# Patient Record
Sex: Male | Born: 1935 | Race: Black or African American | Hispanic: No | Smoking: Never smoker
Health system: Southern US, Community
[De-identification: ages and names within clinical notes are randomized; demographics above are authoritative.]

## PROBLEM LIST (undated history)

## (undated) DIAGNOSIS — M5136 Other intervertebral disc degeneration, lumbar region: Secondary | ICD-10-CM

## (undated) DIAGNOSIS — N400 Enlarged prostate without lower urinary tract symptoms: Secondary | ICD-10-CM

## (undated) DIAGNOSIS — M25579 Pain in unspecified ankle and joints of unspecified foot: Secondary | ICD-10-CM

## (undated) DIAGNOSIS — Z8701 Personal history of pneumonia (recurrent): Secondary | ICD-10-CM

## (undated) DIAGNOSIS — C679 Malignant neoplasm of bladder, unspecified: Secondary | ICD-10-CM

## (undated) DIAGNOSIS — F32A Depression, unspecified: Secondary | ICD-10-CM

## (undated) DIAGNOSIS — I2510 Atherosclerotic heart disease of native coronary artery without angina pectoris: Secondary | ICD-10-CM

## (undated) DIAGNOSIS — E785 Hyperlipidemia, unspecified: Secondary | ICD-10-CM

## (undated) DIAGNOSIS — J019 Acute sinusitis, unspecified: Secondary | ICD-10-CM

## (undated) DIAGNOSIS — R21 Rash and other nonspecific skin eruption: Secondary | ICD-10-CM

## (undated) DIAGNOSIS — J45909 Unspecified asthma, uncomplicated: Secondary | ICD-10-CM

## (undated) DIAGNOSIS — J309 Allergic rhinitis, unspecified: Secondary | ICD-10-CM

## (undated) DIAGNOSIS — R5381 Other malaise: Secondary | ICD-10-CM

## (undated) DIAGNOSIS — R053 Chronic cough: Secondary | ICD-10-CM

## (undated) DIAGNOSIS — R5383 Other fatigue: Secondary | ICD-10-CM

## (undated) DIAGNOSIS — Z86010 Personal history of colonic polyps: Secondary | ICD-10-CM

## (undated) DIAGNOSIS — G629 Polyneuropathy, unspecified: Secondary | ICD-10-CM

## (undated) DIAGNOSIS — J449 Chronic obstructive pulmonary disease, unspecified: Secondary | ICD-10-CM

## (undated) DIAGNOSIS — I10 Essential (primary) hypertension: Secondary | ICD-10-CM

## (undated) DIAGNOSIS — M7989 Other specified soft tissue disorders: Secondary | ICD-10-CM

## (undated) DIAGNOSIS — N3946 Mixed incontinence: Secondary | ICD-10-CM

## (undated) DIAGNOSIS — M79609 Pain in unspecified limb: Secondary | ICD-10-CM

## (undated) DIAGNOSIS — I739 Peripheral vascular disease, unspecified: Secondary | ICD-10-CM

## (undated) DIAGNOSIS — R319 Hematuria, unspecified: Secondary | ICD-10-CM

## (undated) DIAGNOSIS — Z8781 Personal history of (healed) traumatic fracture: Secondary | ICD-10-CM

## (undated) DIAGNOSIS — J4489 Other specified chronic obstructive pulmonary disease: Secondary | ICD-10-CM

## (undated) DIAGNOSIS — N183 Chronic kidney disease, stage 3 unspecified: Secondary | ICD-10-CM

## (undated) DIAGNOSIS — M51369 Other intervertebral disc degeneration, lumbar region without mention of lumbar back pain or lower extremity pain: Secondary | ICD-10-CM

## (undated) DIAGNOSIS — F329 Major depressive disorder, single episode, unspecified: Secondary | ICD-10-CM

## (undated) DIAGNOSIS — J441 Chronic obstructive pulmonary disease with (acute) exacerbation: Secondary | ICD-10-CM

## (undated) DIAGNOSIS — IMO0001 Reserved for inherently not codable concepts without codable children: Secondary | ICD-10-CM

## (undated) DIAGNOSIS — M5430 Sciatica, unspecified side: Secondary | ICD-10-CM

## (undated) DIAGNOSIS — M25519 Pain in unspecified shoulder: Secondary | ICD-10-CM

## (undated) DIAGNOSIS — M549 Dorsalgia, unspecified: Secondary | ICD-10-CM

## (undated) DIAGNOSIS — Z860101 Personal history of adenomatous and serrated colon polyps: Secondary | ICD-10-CM

## (undated) DIAGNOSIS — Z8616 Personal history of COVID-19: Secondary | ICD-10-CM

## (undated) DIAGNOSIS — R609 Edema, unspecified: Secondary | ICD-10-CM

## (undated) DIAGNOSIS — Z972 Presence of dental prosthetic device (complete) (partial): Secondary | ICD-10-CM

## (undated) DIAGNOSIS — F0390 Unspecified dementia without behavioral disturbance: Secondary | ICD-10-CM

## (undated) DIAGNOSIS — E559 Vitamin D deficiency, unspecified: Secondary | ICD-10-CM

## (undated) DIAGNOSIS — J988 Other specified respiratory disorders: Secondary | ICD-10-CM

## (undated) DIAGNOSIS — R209 Unspecified disturbances of skin sensation: Secondary | ICD-10-CM

## (undated) DIAGNOSIS — B0223 Postherpetic polyneuropathy: Secondary | ICD-10-CM

## (undated) HISTORY — DX: Allergic rhinitis, unspecified: J30.9

## (undated) HISTORY — DX: Major depressive disorder, single episode, unspecified: F32.9

## (undated) HISTORY — DX: Pain in unspecified ankle and joints of unspecified foot: M25.579

## (undated) HISTORY — DX: Chronic obstructive pulmonary disease with (acute) exacerbation: J44.1

## (undated) HISTORY — DX: Reserved for inherently not codable concepts without codable children: IMO0001

## (undated) HISTORY — DX: Unspecified asthma, uncomplicated: J45.909

## (undated) HISTORY — DX: Rash and other nonspecific skin eruption: R21

## (undated) HISTORY — DX: Vitamin D deficiency, unspecified: E55.9

## (undated) HISTORY — DX: Sciatica, unspecified side: M54.30

## (undated) HISTORY — DX: Chronic obstructive pulmonary disease, unspecified: J44.9

## (undated) HISTORY — DX: Peripheral vascular disease, unspecified: I73.9

## (undated) HISTORY — DX: Other fatigue: R53.83

## (undated) HISTORY — DX: Polyneuropathy, unspecified: G62.9

## (undated) HISTORY — DX: Unspecified dementia without behavioral disturbance: F03.90

## (undated) HISTORY — DX: Acute sinusitis, unspecified: J01.90

## (undated) HISTORY — DX: Unspecified disturbances of skin sensation: R20.9

## (undated) HISTORY — DX: Other malaise: R53.81

## (undated) HISTORY — DX: Edema, unspecified: R60.9

## (undated) HISTORY — DX: Hyperlipidemia, unspecified: E78.5

## (undated) HISTORY — DX: Essential (primary) hypertension: I10

## (undated) HISTORY — DX: Personal history of colonic polyps: Z86.010

## (undated) HISTORY — DX: Pain in unspecified shoulder: M25.519

## (undated) HISTORY — DX: Atherosclerotic heart disease of native coronary artery without angina pectoris: I25.10

## (undated) HISTORY — DX: Pain in unspecified limb: M79.609

## (undated) HISTORY — DX: Postherpetic polyneuropathy: B02.23

## (undated) HISTORY — DX: Dorsalgia, unspecified: M54.9

## (undated) HISTORY — DX: Benign prostatic hyperplasia without lower urinary tract symptoms: N40.0

## (undated) HISTORY — PX: OTHER SURGICAL HISTORY: SHX169

---

## 1998-08-28 ENCOUNTER — Encounter: Payer: Self-pay | Admitting: Internal Medicine

## 1998-08-28 ENCOUNTER — Ambulatory Visit: Admission: RE | Admit: 1998-08-28 | Discharge: 1998-08-28 | Payer: Self-pay | Admitting: Internal Medicine

## 1998-09-12 ENCOUNTER — Encounter: Payer: Self-pay | Admitting: Internal Medicine

## 1998-09-12 ENCOUNTER — Ambulatory Visit (HOSPITAL_COMMUNITY): Admission: RE | Admit: 1998-09-12 | Discharge: 1998-09-12 | Payer: Self-pay | Admitting: Internal Medicine

## 2001-08-11 ENCOUNTER — Ambulatory Visit (HOSPITAL_COMMUNITY): Admission: RE | Admit: 2001-08-11 | Discharge: 2001-08-11 | Payer: Self-pay | Admitting: *Deleted

## 2001-08-11 ENCOUNTER — Encounter: Payer: Self-pay | Admitting: *Deleted

## 2004-11-25 ENCOUNTER — Ambulatory Visit: Payer: Self-pay | Admitting: Internal Medicine

## 2004-12-11 ENCOUNTER — Ambulatory Visit: Payer: Self-pay | Admitting: Internal Medicine

## 2005-01-09 ENCOUNTER — Ambulatory Visit: Payer: Self-pay | Admitting: Internal Medicine

## 2005-03-20 ENCOUNTER — Ambulatory Visit: Payer: Self-pay | Admitting: Internal Medicine

## 2005-06-25 ENCOUNTER — Ambulatory Visit: Payer: Self-pay | Admitting: Internal Medicine

## 2005-07-08 ENCOUNTER — Ambulatory Visit: Payer: Self-pay | Admitting: Internal Medicine

## 2005-07-25 ENCOUNTER — Ambulatory Visit: Payer: Self-pay | Admitting: Internal Medicine

## 2005-09-03 ENCOUNTER — Ambulatory Visit: Payer: Self-pay | Admitting: Internal Medicine

## 2005-09-10 ENCOUNTER — Ambulatory Visit: Payer: Self-pay | Admitting: Internal Medicine

## 2005-09-12 ENCOUNTER — Ambulatory Visit: Payer: Self-pay

## 2005-09-26 ENCOUNTER — Ambulatory Visit: Payer: Self-pay | Admitting: Internal Medicine

## 2005-10-29 ENCOUNTER — Ambulatory Visit: Payer: Self-pay | Admitting: Internal Medicine

## 2005-12-19 ENCOUNTER — Ambulatory Visit: Payer: Self-pay | Admitting: Internal Medicine

## 2006-01-06 ENCOUNTER — Ambulatory Visit: Payer: Self-pay | Admitting: Internal Medicine

## 2006-06-15 ENCOUNTER — Ambulatory Visit: Payer: Self-pay | Admitting: Internal Medicine

## 2006-06-24 ENCOUNTER — Ambulatory Visit: Payer: Self-pay | Admitting: Internal Medicine

## 2006-06-24 LAB — CONVERTED CEMR LAB
ALT: 19 units/L (ref 0–40)
AST: 22 units/L (ref 0–37)
Albumin: 3.7 g/dL (ref 3.5–5.2)
Alkaline Phosphatase: 55 units/L (ref 39–117)
BUN: 11 mg/dL (ref 6–23)
Basophils Absolute: 0 10*3/uL (ref 0.0–0.1)
Basophils Relative: 0.2 % (ref 0.0–1.0)
Bilirubin Urine: NEGATIVE
CO2: 35 meq/L — ABNORMAL HIGH (ref 19–32)
Calcium: 9.6 mg/dL (ref 8.4–10.5)
Chloride: 98 meq/L (ref 96–112)
Chol/HDL Ratio, serum: 4.3
Cholesterol: 206 mg/dL (ref 0–200)
Creatinine, Ser: 1 mg/dL (ref 0.4–1.5)
Eosinophil percent: 1.8 % (ref 0.0–5.0)
GFR calc non Af Amer: 79 mL/min
Glomerular Filtration Rate, Af Am: 95 mL/min/{1.73_m2}
Glucose, Bld: 85 mg/dL (ref 70–99)
HCT: 45.9 % (ref 39.0–52.0)
HDL: 48 mg/dL (ref >39.0)
Hemoglobin, Urine: NEGATIVE
Hemoglobin: 14.9 g/dL (ref 13.0–17.0)
Ketones, ur: NEGATIVE mg/dL
LDL DIRECT: 131.5 mg/dL
Leukocytes, UA: NEGATIVE
Lymphocytes Relative: 12.2 % (ref 12.0–46.0)
MCHC: 32.5 g/dL (ref 30.0–36.0)
MCV: 82.6 fL (ref 78.0–100.0)
Monocytes Absolute: 0.6 10*3/uL (ref 0.2–0.7)
Monocytes Relative: 7.4 % (ref 3.0–11.0)
Neutro Abs: 6.4 10*3/uL (ref 1.4–7.7)
Neutrophils Relative %: 78.4 % — ABNORMAL HIGH (ref 43.0–77.0)
Nitrite: NEGATIVE
PSA: 1.2 ng/mL (ref 0.10–4.00)
Platelets: 219 10*3/uL (ref 150–400)
Potassium: 3.9 meq/L (ref 3.5–5.1)
RBC: 5.55 M/uL (ref 4.22–5.81)
RDW: 13.9 % (ref 11.5–14.6)
Sodium: 139 meq/L (ref 135–145)
Specific Gravity, Urine: 1.02 (ref 1.000–1.03)
TSH: 0.49 microintl units/mL (ref 0.35–5.50)
Total Bilirubin: 0.7 mg/dL (ref 0.3–1.2)
Total Protein, Urine: NEGATIVE mg/dL
Total Protein: 7.2 g/dL (ref 6.0–8.3)
Triglyceride fasting, serum: 79 mg/dL (ref 0–149)
Urine Glucose: NEGATIVE mg/dL
Urobilinogen, UA: 0.2 (ref 0.0–1.0)
VLDL: 16 mg/dL (ref 0–40)
WBC: 8.1 10*3/uL (ref 4.5–10.5)
pH: 6.5 (ref 5.0–8.0)

## 2006-06-26 ENCOUNTER — Ambulatory Visit: Payer: Self-pay | Admitting: Internal Medicine

## 2006-07-14 ENCOUNTER — Ambulatory Visit: Payer: Self-pay | Admitting: Internal Medicine

## 2006-07-29 ENCOUNTER — Encounter (INDEPENDENT_AMBULATORY_CARE_PROVIDER_SITE_OTHER): Payer: Self-pay | Admitting: *Deleted

## 2006-07-29 ENCOUNTER — Ambulatory Visit: Payer: Self-pay | Admitting: Internal Medicine

## 2007-02-11 ENCOUNTER — Ambulatory Visit: Payer: Self-pay | Admitting: Internal Medicine

## 2007-04-03 ENCOUNTER — Encounter: Payer: Self-pay | Admitting: Internal Medicine

## 2007-04-03 DIAGNOSIS — I10 Essential (primary) hypertension: Secondary | ICD-10-CM

## 2007-04-03 DIAGNOSIS — E785 Hyperlipidemia, unspecified: Secondary | ICD-10-CM

## 2007-04-23 ENCOUNTER — Ambulatory Visit: Payer: Self-pay | Admitting: Internal Medicine

## 2007-04-23 ENCOUNTER — Inpatient Hospital Stay (HOSPITAL_COMMUNITY): Admission: AD | Admit: 2007-04-23 | Discharge: 2007-04-27 | Payer: Self-pay | Admitting: Internal Medicine

## 2007-04-23 LAB — CONVERTED CEMR LAB
Basophils Absolute: 0 10*3/uL (ref 0.0–0.1)
Basophils Relative: 0 % (ref 0.0–1.0)
Eosinophils Absolute: 0.1 10*3/uL (ref 0.0–0.6)
Eosinophils Relative: 0.5 % (ref 0.0–5.0)
HCT: 48.5 % (ref 39.0–52.0)
Hemoglobin: 15.8 g/dL (ref 13.0–17.0)
Lymphocytes Relative: 6.7 % — ABNORMAL LOW (ref 12.0–46.0)
MCHC: 32.5 g/dL (ref 30.0–36.0)
MCV: 81.7 fL (ref 78.0–100.0)
Monocytes Absolute: 0.6 10*3/uL (ref 0.2–0.7)
Monocytes Relative: 4.7 % (ref 3.0–11.0)
Neutro Abs: 12.2 10*3/uL — ABNORMAL HIGH (ref 1.4–7.7)
Neutrophils Relative %: 88.1 % — ABNORMAL HIGH (ref 43.0–77.0)
Platelets: 182 10*3/uL (ref 150–400)
RBC: 5.93 M/uL — ABNORMAL HIGH (ref 4.22–5.81)
RDW: 15.1 % — ABNORMAL HIGH (ref 11.5–14.6)
WBC: 13.8 10*3/uL — ABNORMAL HIGH (ref 4.5–10.5)

## 2007-05-18 ENCOUNTER — Ambulatory Visit: Payer: Self-pay | Admitting: Internal Medicine

## 2007-10-27 ENCOUNTER — Ambulatory Visit: Payer: Self-pay | Admitting: Internal Medicine

## 2007-10-27 DIAGNOSIS — Z860100 Personal history of colon polyps, unspecified: Secondary | ICD-10-CM | POA: Insufficient documentation

## 2007-10-27 DIAGNOSIS — I2510 Atherosclerotic heart disease of native coronary artery without angina pectoris: Secondary | ICD-10-CM

## 2007-10-27 DIAGNOSIS — R21 Rash and other nonspecific skin eruption: Secondary | ICD-10-CM | POA: Insufficient documentation

## 2007-10-27 DIAGNOSIS — J309 Allergic rhinitis, unspecified: Secondary | ICD-10-CM

## 2007-10-27 DIAGNOSIS — N400 Enlarged prostate without lower urinary tract symptoms: Secondary | ICD-10-CM | POA: Insufficient documentation

## 2007-10-27 DIAGNOSIS — J45909 Unspecified asthma, uncomplicated: Secondary | ICD-10-CM | POA: Insufficient documentation

## 2007-10-27 DIAGNOSIS — J449 Chronic obstructive pulmonary disease, unspecified: Secondary | ICD-10-CM | POA: Insufficient documentation

## 2007-10-27 DIAGNOSIS — I739 Peripheral vascular disease, unspecified: Secondary | ICD-10-CM | POA: Insufficient documentation

## 2007-11-02 ENCOUNTER — Encounter (INDEPENDENT_AMBULATORY_CARE_PROVIDER_SITE_OTHER): Payer: Self-pay | Admitting: *Deleted

## 2007-11-15 ENCOUNTER — Telehealth (INDEPENDENT_AMBULATORY_CARE_PROVIDER_SITE_OTHER): Payer: Self-pay | Admitting: *Deleted

## 2007-11-22 ENCOUNTER — Ambulatory Visit: Payer: Self-pay | Admitting: Internal Medicine

## 2007-11-22 DIAGNOSIS — J441 Chronic obstructive pulmonary disease with (acute) exacerbation: Secondary | ICD-10-CM

## 2008-02-04 ENCOUNTER — Ambulatory Visit: Payer: Self-pay | Admitting: Internal Medicine

## 2008-02-04 DIAGNOSIS — M79609 Pain in unspecified limb: Secondary | ICD-10-CM | POA: Insufficient documentation

## 2008-02-04 DIAGNOSIS — IMO0001 Reserved for inherently not codable concepts without codable children: Secondary | ICD-10-CM

## 2008-02-07 ENCOUNTER — Encounter (INDEPENDENT_AMBULATORY_CARE_PROVIDER_SITE_OTHER): Payer: Self-pay | Admitting: *Deleted

## 2008-02-21 ENCOUNTER — Telehealth (INDEPENDENT_AMBULATORY_CARE_PROVIDER_SITE_OTHER): Payer: Self-pay | Admitting: *Deleted

## 2008-05-30 ENCOUNTER — Telehealth: Payer: Self-pay | Admitting: Internal Medicine

## 2008-06-22 ENCOUNTER — Ambulatory Visit: Payer: Self-pay | Admitting: Internal Medicine

## 2008-06-22 LAB — CONVERTED CEMR LAB
ALT: 20 units/L (ref 0–53)
AST: 20 units/L (ref 0–37)
Albumin: 3.7 g/dL (ref 3.5–5.2)
Alkaline Phosphatase: 50 units/L (ref 39–117)
BUN: 20 mg/dL (ref 6–23)
Basophils Absolute: 0 10*3/uL (ref 0.0–0.1)
Basophils Relative: 0.1 % (ref 0.0–3.0)
Bilirubin Urine: NEGATIVE
Bilirubin, Direct: 0.3 mg/dL (ref 0.0–0.3)
CO2: 33 meq/L — ABNORMAL HIGH (ref 19–32)
Calcium: 9.3 mg/dL (ref 8.4–10.5)
Chloride: 99 meq/L (ref 96–112)
Cholesterol: 180 mg/dL (ref 0–200)
Creatinine, Ser: 1.1 mg/dL (ref 0.4–1.5)
Eosinophils Absolute: 0.1 10*3/uL (ref 0.0–0.7)
Eosinophils Relative: 1.3 % (ref 0.0–5.0)
GFR calc Af Amer: 85 mL/min
GFR calc non Af Amer: 70 mL/min
Glucose, Bld: 84 mg/dL (ref 70–99)
HCT: 45 % (ref 39.0–52.0)
HDL: 67.6 mg/dL (ref >39.0)
Hemoglobin, Urine: NEGATIVE
Hemoglobin: 15.1 g/dL (ref 13.0–17.0)
Ketones, ur: NEGATIVE mg/dL
LDL Cholesterol: 102 mg/dL — ABNORMAL HIGH (ref 0–99)
Leukocytes, UA: NEGATIVE
Lymphocytes Relative: 13.9 % (ref 12.0–46.0)
MCHC: 33.6 g/dL (ref 30.0–36.0)
MCV: 82.1 fL (ref 78.0–100.0)
Monocytes Absolute: 0.6 10*3/uL (ref 0.1–1.0)
Monocytes Relative: 8.4 % (ref 3.0–12.0)
Neutro Abs: 5 10*3/uL (ref 1.4–7.7)
Neutrophils Relative %: 76.3 % (ref 43.0–77.0)
Nitrite: NEGATIVE
PSA: 0.91 ng/mL (ref 0.10–4.00)
Platelets: 177 10*3/uL (ref 150–400)
Potassium: 3.4 meq/L — ABNORMAL LOW (ref 3.5–5.1)
RBC: 5.48 M/uL (ref 4.22–5.81)
RDW: 14.7 % — ABNORMAL HIGH (ref 11.5–14.6)
Sodium: 140 meq/L (ref 135–145)
Specific Gravity, Urine: 1.025 (ref 1.000–1.03)
TSH: 1.09 microintl units/mL (ref 0.35–5.50)
Total Bilirubin: 1.1 mg/dL (ref 0.3–1.2)
Total CHOL/HDL Ratio: 2.7
Total Protein, Urine: NEGATIVE mg/dL
Total Protein: 7.3 g/dL (ref 6.0–8.3)
Triglycerides: 50 mg/dL (ref 0–149)
Urine Glucose: NEGATIVE mg/dL
Urobilinogen, UA: 0.2 (ref 0.0–1.0)
VLDL: 10 mg/dL (ref 0–40)
WBC: 6.6 10*3/uL (ref 4.5–10.5)
pH: 5.5 (ref 5.0–8.0)

## 2008-07-05 ENCOUNTER — Telehealth: Payer: Self-pay | Admitting: Internal Medicine

## 2008-07-13 ENCOUNTER — Encounter: Payer: Self-pay | Admitting: Internal Medicine

## 2008-08-15 ENCOUNTER — Ambulatory Visit: Payer: Self-pay | Admitting: Internal Medicine

## 2008-08-15 DIAGNOSIS — B0223 Postherpetic polyneuropathy: Secondary | ICD-10-CM

## 2008-12-15 ENCOUNTER — Ambulatory Visit: Payer: Self-pay | Admitting: Internal Medicine

## 2008-12-16 LAB — CONVERTED CEMR LAB
AST: 21 units/L (ref 0–37)
Bilirubin, Direct: 0.2 mg/dL (ref 0.0–0.3)
HDL: 57.9 mg/dL (ref >39.00)
Total Bilirubin: 1.1 mg/dL (ref 0.3–1.2)
Total CHOL/HDL Ratio: 3

## 2009-01-23 ENCOUNTER — Telehealth (INDEPENDENT_AMBULATORY_CARE_PROVIDER_SITE_OTHER): Payer: Self-pay | Admitting: *Deleted

## 2009-01-23 ENCOUNTER — Encounter: Payer: Self-pay | Admitting: Internal Medicine

## 2009-01-24 ENCOUNTER — Ambulatory Visit: Payer: Self-pay | Admitting: Internal Medicine

## 2009-02-23 ENCOUNTER — Ambulatory Visit: Payer: Self-pay | Admitting: Internal Medicine

## 2009-04-24 ENCOUNTER — Ambulatory Visit: Payer: Self-pay | Admitting: Internal Medicine

## 2009-04-24 DIAGNOSIS — R042 Hemoptysis: Secondary | ICD-10-CM | POA: Insufficient documentation

## 2009-06-05 ENCOUNTER — Ambulatory Visit: Payer: Self-pay | Admitting: Internal Medicine

## 2009-06-15 LAB — CONVERTED CEMR LAB
Albumin: 3.7 g/dL (ref 3.5–5.2)
BUN: 13 mg/dL (ref 6–23)
Basophils Absolute: 0.1 10*3/uL (ref 0.0–0.1)
CO2: 32 meq/L (ref 19–32)
Calcium: 9.3 mg/dL (ref 8.4–10.5)
Eosinophils Absolute: 0.1 10*3/uL (ref 0.0–0.7)
Glucose, Bld: 77 mg/dL (ref 70–99)
HCT: 44.2 % (ref 39.0–52.0)
Hemoglobin: 14.7 g/dL (ref 13.0–17.0)
Lymphs Abs: 0.8 10*3/uL (ref 0.7–4.0)
MCHC: 33.3 g/dL (ref 30.0–36.0)
Neutro Abs: 7.4 10*3/uL (ref 1.4–7.7)
Platelets: 178 10*3/uL (ref 150.0–400.0)
Potassium: 4 meq/L (ref 3.5–5.1)
RDW: 13.7 % (ref 11.5–14.6)
Sodium: 144 meq/L (ref 135–145)
Total Bilirubin: 0.8 mg/dL (ref 0.3–1.2)

## 2009-06-21 ENCOUNTER — Ambulatory Visit: Payer: Self-pay | Admitting: Internal Medicine

## 2009-06-21 DIAGNOSIS — M25519 Pain in unspecified shoulder: Secondary | ICD-10-CM

## 2009-07-20 ENCOUNTER — Telehealth: Payer: Self-pay | Admitting: Family Medicine

## 2009-10-08 ENCOUNTER — Ambulatory Visit: Payer: Self-pay | Admitting: Internal Medicine

## 2009-10-08 DIAGNOSIS — M549 Dorsalgia, unspecified: Secondary | ICD-10-CM | POA: Insufficient documentation

## 2009-10-08 DIAGNOSIS — J019 Acute sinusitis, unspecified: Secondary | ICD-10-CM

## 2009-10-10 ENCOUNTER — Ambulatory Visit: Payer: Self-pay | Admitting: Internal Medicine

## 2009-10-11 ENCOUNTER — Telehealth: Payer: Self-pay | Admitting: Internal Medicine

## 2009-11-08 ENCOUNTER — Ambulatory Visit: Payer: Self-pay | Admitting: Internal Medicine

## 2009-11-08 DIAGNOSIS — M5430 Sciatica, unspecified side: Secondary | ICD-10-CM

## 2009-11-16 ENCOUNTER — Ambulatory Visit: Payer: Self-pay | Admitting: Internal Medicine

## 2009-11-16 DIAGNOSIS — R609 Edema, unspecified: Secondary | ICD-10-CM | POA: Insufficient documentation

## 2009-11-16 LAB — CONVERTED CEMR LAB
CO2: 33 meq/L — ABNORMAL HIGH (ref 19–32)
Chloride: 105 meq/L (ref 96–112)
Potassium: 4 meq/L (ref 3.5–5.1)
Sodium: 144 meq/L (ref 135–145)

## 2009-11-30 ENCOUNTER — Ambulatory Visit: Payer: Self-pay | Admitting: Internal Medicine

## 2009-11-30 DIAGNOSIS — R5383 Other fatigue: Secondary | ICD-10-CM

## 2009-11-30 DIAGNOSIS — R5381 Other malaise: Secondary | ICD-10-CM | POA: Insufficient documentation

## 2009-11-30 DIAGNOSIS — M25579 Pain in unspecified ankle and joints of unspecified foot: Secondary | ICD-10-CM

## 2009-11-30 DIAGNOSIS — F329 Major depressive disorder, single episode, unspecified: Secondary | ICD-10-CM

## 2009-11-30 LAB — CONVERTED CEMR LAB
ALT: 12 units/L (ref 0–53)
BUN: 12 mg/dL (ref 6–23)
Bilirubin Urine: NEGATIVE
CO2: 35 meq/L — ABNORMAL HIGH (ref 19–32)
Chloride: 102 meq/L (ref 96–112)
Cholesterol: 186 mg/dL (ref 0–200)
Eosinophils Relative: 2 % (ref 0.0–5.0)
Glucose, Bld: 80 mg/dL (ref 70–99)
HCT: 40 % (ref 39.0–52.0)
Ketones, ur: NEGATIVE mg/dL
LDL Cholesterol: 102 mg/dL — ABNORMAL HIGH (ref 0–99)
Lymphs Abs: 0.7 10*3/uL (ref 0.7–4.0)
MCHC: 33.5 g/dL (ref 30.0–36.0)
MCV: 81.3 fL (ref 78.0–100.0)
Monocytes Absolute: 0.6 10*3/uL (ref 0.1–1.0)
Platelets: 168 10*3/uL (ref 150.0–400.0)
Potassium: 4.1 meq/L (ref 3.5–5.1)
Saturation Ratios: 28.7 % (ref 20.0–50.0)
Sed Rate: 24 mm/hr — ABNORMAL HIGH (ref 0–22)
TSH: 0.59 microintl units/mL (ref 0.35–5.50)
Total Bilirubin: 0.6 mg/dL (ref 0.3–1.2)
Total Protein: 6.3 g/dL (ref 6.0–8.3)
Triglycerides: 41 mg/dL (ref 0.0–149.0)
Urine Glucose: NEGATIVE mg/dL
Urobilinogen, UA: 2 (ref 0.0–1.0)
VLDL: 8.2 mg/dL (ref 0.0–40.0)
WBC: 6.7 10*3/uL (ref 4.5–10.5)

## 2009-12-03 ENCOUNTER — Ambulatory Visit: Payer: Self-pay | Admitting: Internal Medicine

## 2010-06-04 ENCOUNTER — Ambulatory Visit: Payer: Self-pay | Admitting: Internal Medicine

## 2010-06-04 DIAGNOSIS — E559 Vitamin D deficiency, unspecified: Secondary | ICD-10-CM

## 2010-06-05 ENCOUNTER — Ambulatory Visit: Payer: Self-pay | Admitting: Internal Medicine

## 2010-09-02 ENCOUNTER — Ambulatory Visit
Admission: RE | Admit: 2010-09-02 | Discharge: 2010-09-02 | Payer: Self-pay | Source: Home / Self Care | Attending: Internal Medicine | Admitting: Internal Medicine

## 2010-09-02 DIAGNOSIS — R209 Unspecified disturbances of skin sensation: Secondary | ICD-10-CM | POA: Insufficient documentation

## 2010-09-24 NOTE — Assessment & Plan Note (Signed)
Summary: 6 months/ mbw   CC:  6 mth rov - SOB comes and goes - Occas prod cough (white) - Denies wheezing - Pt is out of Symbicort and Proair.  History of Present Illness:  04/24/09- Asthma/ COPD, hx Byssinosis, allergic thinitis, rhinitsinusitis Coughed streaks of  blood in mucus again for a few days last week. Self limited. Has been taking aspirin off and on- discussed. Not coughing more than usual. Denies chest pain or fever, glands or other  bleeding. Denies exertional chest pain, palpitation, heartburn nausea, reflux, melena. CXR 01/23/09- questioned pneumonia left lung.  June 05, 2009- Asthma/ COPD, hx Byssinosis, allergic rhinitis, rhiniosinusitis Compalins of lack of energy for several weeks. Bleeding stopped when he quit Anacins. Denies glands, fever, pain, weight loss.  Breathing ok but nose runs. CXR- COPD but no acute change since 2008  December 03, 2009- Asthma/ COPD, hx Byssinosis, allergic rhinitis, rhinosinusitis ........daughter here He has been seeing Dr Jonny Ruiz quite a bit and took an antibiotic for bronchits. He has now retired. He says he is breathing ok today. He wears a mask to mow. Has not coughed blood in a very long time, especially as he has backed off on aspirin. He still uses Symbicort twice daily.  June 05, 2010- Asthma/ COPD, hx Byssinosis, allergic rhinitis, rhinosinusitis  Nurse CC: 6 mth rov - SOB comes and goes - Occas prod cough (white) - Denies wheezing - Pt is out of Symbicort and Proair.  He is retired and not doing much. Treated nasal stuffiness adding fluticasone- discussed. Work exposure was dusty, but away from that he has much less cough and wheeze. He doesn't want flu shot- never had one. Does have daily cough.    Asthma History    Initial Asthma Severity Rating:    Age range: 12+ years    Symptoms: >2 days/week; not daily    Nighttime Awakenings: 0-2/month    Interferes w/ normal activity: no limitations    SABA use (not for EIB): 0-2  days/week    Asthma Severity Assessment: Mild Persistent   Preventive Screening-Counseling & Management  Alcohol-Tobacco     Smoking Status: never  Current Medications (verified): 1)  Lovastatin 40 Mg  Tabs (Lovastatin) .Marland Kitchen.. 1 By Mouth Once Daily 2)  Exforge Hct 5-160-12.5 Mg Tabs (Amlodipine-Valsartan-Hctz) .Marland Kitchen.. 1po Once Daily 3)  Ecotrin Low Strength 81 Mg  Tbec (Aspirin) .Marland Kitchen.. 1 By Mouth Qd 4)  Symbicort 160-4.5 Mcg/act  Aero (Budesonide-Formoterol Fumarate) .... 2 Puffs Bid 5)  Proair Hfa 108 (90 Base) Mcg/act Aers (Albuterol Sulfate) .... 2 Puffs Four Times A Day As Needed Rescue Inhaler 6)  Fluticasone Propionate 50 Mcg/act Susp (Fluticasone Propionate) .... 2 Spray/side Once Daily 7)  Fexofenadine Hcl 180 Mg Tabs (Fexofenadine Hcl) .Marland Kitchen.. 1po Once Daily For Allergies 8)  Vitamin D3 2000 Unit Caps (Cholecalciferol) .... Take One By Mouth Once Daily  Allergies (verified): 1)  ! Pcn 2)  ! Ace Inhibitors 3)  Lipitor 4)  Simvastatin  Comments:  Nurse/Medical Assistant: The patient's medications and allergies were reviewed with the patient and were updated in the Medication and Allergy Lists.  Past History:  Past Medical History: Last updated: 11/30/2009 Hyperlipidemia Hypertension Asthma COPD/chronic bronchitic component byssinosis Hemoptysis- intermittent, chronic Allergic rhinitis Colonic polyps, hx of - adenomatous recurrent boil Peripheral vascular disease - severe - refuses surgery Coronary artery disease Benign prostatic hypertrophy lumbar disc disease Depression  Past Surgical History: Last updated: 10/27/2007 Denies surgical history  Family History: Last updated: 10/27/2007 HTN -  3 sisters stroke lung cancer breast cancer alcoholism brother died with anuerysm  Social History: Last updated: 10/27/2007 Never Smoked Alcohol use-no 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  The patient denies shortness of breath with activity, shortness of breath at rest, productive cough, coughing up blood, chest pain, irregular heartbeats, acid heartburn, indigestion, loss of appetite, weight change, abdominal pain, difficulty swallowing, sore throat, tooth/dental problems, headaches, nasal congestion/difficulty breathing through nose, and sneezing.    Vital Signs:  Patient profile:   77 year old male Weight:      166.38 pounds O2 Sat:      96 % on Room air Pulse rate:   72 / minute BP sitting:   130 / 80  (left arm) Cuff size:   regular  Vitals Entered By: Abigail Miyamoto RN (June 05, 2010 9:34 AM)  O2 Flow:  Room air  Physical Exam  Additional Exam:  General: A/Ox3; pleasant and cooperative, NAD,  seems comfortable SKIN: no rash, lesions NODES: no lymphadenopathy HEENT: [place]/AT, EOM- WNL, Conjuctivae- clear, PERRLA, TM-WNL, Nose- clear, Throat- clear and wnl, Mallampati  II-III NECK: Supple w/ fair ROM, JVD- none, normal carotid impulses w/o bruits Thyroid-  CHEST: raspy coarse large airway sounds, right more than left, unlabored. HEART: RRR, no m/g/r heard ABDOMEN: Soft and nl;  ZOX:WRUE, nl pulses, no edema  NEURO: Grossly intact to observation      Impression & Recommendations:  Problem # 1:  COPD (ICD-496) Hx of Byssinosis/ cotton dust, chronic bronchits. He makes a point of walking regularly and never smoked. I encouraged him to reconsider flu vax and he will think about it. We went through this issue in detail.. I will refill his meds.   Problem # 2:  ALLERGIC RHINITIS (ICD-477.9)  Fluticasone discussed. His updated medication list for this problem includes:    Fluticasone Propionate 50 Mcg/act Susp (Fluticasone propionate) .Marland Kitchen... 2 spray/side once daily    Fexofenadine Hcl 180 Mg Tabs (Fexofenadine hcl) .Marland Kitchen... 1po once daily for allergies  Medications Added to Medication List This  Visit: 1)  Vitamin D3 2000 Unit Caps (Cholecalciferol) .... Take one by mouth once daily  Other Orders: Est. Patient Level IV (45409)  Patient Instructions: 1)  Please schedule a follow-up appointment in 6 months. 2)  Please think about starting to get a flu shot each year- you aren't getting younger and flu can really hit you hard.  3)  Refill for Symbicort and for Proair sent to your drug store. Prescriptions: PROAIR HFA 108 (90 BASE) MCG/ACT AERS (ALBUTEROL SULFATE) 2 puffs four times a day as needed rescue inhaler  #1 x prn   Entered and Authorized by:   Waymon Budge MD   Signed by:   Waymon Budge MD on 06/05/2010   Method used:   Electronically to        [facility] [phone]* (retail)       [address]       [place], [place]  [address]       Ph: [phone]       Fax: [phone]   RxID:   [id number] SYMBICORT 160-4.5 MCG/ACT  AERO (BUDESONIDE-FORMOTEROL FUMARATE) 2 puffs bid  #1 x prn   Entered and Authorized by:   Waymon Budge MD  Signed by:   Waymon Budge MD on 06/05/2010   Method used:   Electronically to        [facility] [phone]* (retail)       [address]       [place], [place]  [address]       Ph: [phone]       Fax: [phone]   RxID:   [id number]

## 2010-09-24 NOTE — Assessment & Plan Note (Signed)
Summary: LEG PAIN / SINUS /NWS   Vital Signs:  Patient profile:   77 year old male Height:      66 inches Weight:      174 pounds BMI:     28.19 O2 Sat:      96 % on Room air Temp:     98.8 degrees F oral Pulse rate:   115 / minute BP sitting:   158 / 98  (left arm) Cuff size:   regular  Vitals Entered ByZella Ball Ewing (October 08, 2009 4:16 PM)  O2 Flow:  Room air  CC: cough, congestion, left leg pain/RE   CC:  cough, congestion, and left leg pain/RE.  History of Present Illness: here with facial pain, pressure, fever and greenish d/c with mild ST overall mild to mod for 3 days, but Pt denies CP, sob, doe, wheezing, orthopnea, pnd, worsening LE edema, palps, dizziness or syncope   Pt denies new neuro symptoms such as headache, facial or extremity weakness   Here wtih also with onset mild acute on chronic bialt lower back pain, worse left more than right, and for the past wk assoc with left post leg pain from the the buttock to the left foot;  denies LLE weakness or numbness, gait problem or fall except only seems a bit weaker when the pain flares to the left back and leg;  denies fever, night sweats, wt loss, bowel or bladder changes, injury or fall.  Had similar pain years ago that resolved, no prior hx of known lumbar disc problem, MRI, surgury, need for ortho or PT eval.  Denies aggrev or alleviating factors, did not try any other OTC meds.  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) 7)  Leg Pain, Bilateral  (ICD-729.5) 8)  Preventive Health Care  (ICD-V70.0) 9)  Foot Pain  (ICD-729.5) 10)  Myalgia  (ICD-729.1) 11)  Chronic Obstructive Pulmonary Disease, Acute Exacerbation  (ICD-491.21) 12)  Benign Prostatic Hypertrophy  (ICD-600.00) 13)  Coronary Artery Disease  (ICD-414.00) 14)  Peripheral Vascular Disease  (ICD-443.9) 15)  Colonic Polyps, Hx of  (ICD-V12.72) 16)  Allergic Rhinitis  (ICD-477.9) 17)  COPD  (ICD-496) 18)  Asthma  (ICD-493.90) 19)  Rash-nonvesicular  (ICD-782.1) 20)  Hypertension  (ICD-401.9) 21)  Hyperlipidemia  (ICD-272.4)  Medications Prior to Update: 1)  Lovastatin 40 Mg  Tabs (Lovastatin) .Marland Kitchen.. 1 By Mouth Once Daily 2)  Exforge 5-160 Mg Tabs (Amlodipine Besylate-Valsartan) .... Take 1 Tablet By Mouth Once A Day 3)  Ecotrin Low Strength 81 Mg  Tbec (Aspirin) .Marland Kitchen.. 1 By Mouth Qd 4)  Symbicort 160-4.5 Mcg/act  Aero (Budesonide-Formoterol Fumarate) .... 2 Puffs Bid 5)  Gabapentin 300 Mg Caps (Gabapentin) .Marland Kitchen.. 1 By Mouth Three Times A Day 6)  Flexeril 5 Mg Tabs (Cyclobenzaprine Hcl) .Marland Kitchen.. 1po Three Times A Day As Needed 7)  Darvocet-N 100 100-650 Mg Tabs (Propoxyphene N-Apap) .Marland Kitchen.. 1po Qid As Needed  Current Medications (verified): 1)  Lovastatin 40 Mg  Tabs (  Lovastatin) .Marland Kitchen.. 1 By Mouth Once Daily 2)  Exforge 5-160 Mg Tabs (Amlodipine Besylate-Valsartan) .... Take 1 Tablet By Mouth Once A Day 3)  Ecotrin Low Strength 81 Mg  Tbec (Aspirin) .Marland Kitchen.. 1 By Mouth Qd 4)  Symbicort 160-4.5 Mcg/act  Aero (Budesonide-Formoterol Fumarate) .... 2 Puffs Bid 5)  Gabapentin 300 Mg Caps (Gabapentin) .Marland Kitchen.. 1 By Mouth Three Times A Day 6)  Flexeril 5 Mg Tabs (Cyclobenzaprine Hcl) .Marland Kitchen.. 1po Three Times A Day As Needed 7)  Clarithromycin 500 Mg Tabs (Clarithromycin) .Marland Kitchen.. 1po Two Times A Day 8)  Hydrocodone-Homatropine 5-1.5 Mg/30ml Syrp (Hydrocodone-Homatropine) .Marland Kitchen.. 1 Tsp By Mouth Q 6 Hrs As Needed Cough 9)  Tramadol Hcl 50 Mg Tabs (Tramadol Hcl) .Marland Kitchen.. 1 -2 By  Mouth Q 6hrs As Needed Pain 10)  Prednisone 10 Mg Tabs (Prednisone) .... 3po Qd For 3days, Then 2po Qd For 3days, Then 1po Qd For 3days, Then Stop  Allergies (verified): 1)  ! Pcn 2)  ! 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   Eyes:  vision grossly intact, pupils equal, and pupils round.   Ears:  bilat tm's red, sinus tender bilat Nose:  nasal dischargemucosal pallor and mucosal edema.   Mouth:  pharyngeal erythema and fair dentition.   Neck:  supple and cervical lymphadenopathy.   Lungs:  normal respiratory effort and normal breath sounds.   Heart:  normal rate and regular rhythm.   Msk:  no spine tender;  has some mild to mod lumbar left > right paravertebral tender Extremities:  no edema, no erythema  Neurologic:  cranial nerves II-XII intact, strength normal in lower extremities, and sensation intact to light touch.     Impression & Recommendations:  Problem # 1:  SINUSITIS- ACUTE-NOS (ICD-461.9)  His updated medication list for this problem includes:    Clarithromycin 500 Mg Tabs (Clarithromycin) .Marland Kitchen... 1po two times a day    Hydrocodone-homatropine 5-1.5 Mg/46ml Syrp (Hydrocodone-homatropine)  .Marland Kitchen... 1 tsp by mouth q 6 hrs as needed cough treat as above, f/u any worsening signs or symptoms   Problem # 2:  BACK PAIN (ICD-724.5)  His updated medication list for this problem includes:    Ecotrin Low Strength 81 Mg Tbec (Aspirin) .Marland Kitchen... 1 by mouth qd    Flexeril 5 Mg Tabs (Cyclobenzaprine hcl) .Marland Kitchen... 1po three times a day as needed    Tramadol Hcl 50 Mg Tabs (Tramadol hcl) .Marland Kitchen... 1 -2 by mouth q 6hrs as needed pain with left sided sciatica type pain - treat as above, f/u any worsening signs or symptoms , and prednisone burst and taper off  Problem # 3:  HYPERTENSION (ICD-401.9)  His updated medication list for this problem includes:    Exforge 5-160 Mg Tabs (Amlodipine besylate-valsartan) .Marland Kitchen... Take 1 tablet by mouth once a day  BP today: 158/98 Prior BP: 150/100 (06/21/2009)  Labs Reviewed: K+: 4.0 (06/05/2009) Creat: : 1.0 (06/05/2009)   Chol: 172 (12/15/2008)   HDL: 57.90 (12/15/2008)   LDL: 98 (12/15/2008)   TG: 82.0 (12/15/2008) mild elev today, likely situational, ok to follow, continue same treatment   Complete Medication List: 1)  Lovastatin 40 Mg Tabs (Lovastatin) .Marland Kitchen.. 1 by mouth once daily 2)  Exforge 5-160 Mg Tabs (Amlodipine besylate-valsartan) .... Take 1 tablet by mouth once a day 3)  Ecotrin Low Strength 81 Mg Tbec (Aspirin) .Marland Kitchen.. 1 by mouth qd 4)  Symbicort 160-4.5 Mcg/act Aero (Budesonide-formoterol fumarate) .... 2 puffs bid 5)  Gabapentin 300 Mg Caps (Gabapentin) .Marland Kitchen.. 1 by mouth three times a day 6)  Flexeril 5 Mg Tabs (Cyclobenzaprine hcl) .Marland Kitchen.. 1po three times a day as needed 7)  Clarithromycin 500 Mg Tabs (Clarithromycin) .Marland Kitchen.. 1po two times a day 8)  Hydrocodone-homatropine 5-1.5 Mg/47ml Syrp (Hydrocodone-homatropine) .Marland Kitchen.. 1 tsp by mouth q 6 hrs as needed cough 9)  Tramadol Hcl 50 Mg Tabs (Tramadol hcl) .Marland Kitchen.. 1 -2 by mouth q 6hrs as needed pain 10)  Prednisone 10 Mg Tabs (Prednisone) .... 3po qd for 3days, then 2po qd for 3days, then 1po qd for 3days, then  stop  Patient Instructions: 1)  Please take all new medications as prescribed - the antibiotic, cough mediciine, pain medication for the back and prednisone for the back pain as well 2)  Continue all previous medications as before this visit  3)  You can also use Mucinex OTC or it's generic for congestion  4)  Please take time off work for 2 to 3 days to get better 5)  Please schedule a follow-up appointment in 1 month with CPX labs Prescriptions: PREDNISONE 10 MG TABS (PREDNISONE) 3po qd for 3days, then 2po qd for 3days, then 1po qd for 3days, then stop  #18 x 0   Entered and Authorized by:   Corwin Levins MD   Signed by:   Corwin Levins MD on 10/08/2009   Method used:   Print then Give to Patient   RxID:   [id number] TRAMADOL HCL 50 MG TABS (TRAMADOL HCL) 1 -2 by mouth q 6hrs as needed pain  #60 x 1   Entered and Authorized by:   Corwin Levins MD   Signed by:   Corwin Levins MD on 10/08/2009   Method used:   Print then Give to Patient   RxID:   [id number] HYDROCODONE-HOMATROPINE 5-1.5 MG/5ML SYRP (HYDROCODONE-HOMATROPINE) 1 tsp by mouth q 6 hrs as needed cough  #6oz x 1   Entered and Authorized by:   Corwin Levins MD   Signed by:   Corwin Levins MD on 10/08/2009   Method used:   Print then Give to Patient   RxID:   [id number] CLARITHROMYCIN 500 MG TABS (CLARITHROMYCIN) 1po two times a day  #20 x 0   Entered and Authorized by:   Corwin Levins MD   Signed by:   Corwin Levins MD on 10/08/2009   Method used:   Print then Give to Patient   RxID:   [id number]

## 2010-09-24 NOTE — Assessment & Plan Note (Signed)
Summary: swollen legs/#/cd   Vital Signs:  Patient profile:   77 year old Gray Height:      68 inches Weight:      177 pounds BMI:     27.01 O2 Sat:      97 % on Room air Temp:     97.5 degrees F oral Pulse rate:   86 / minute BP sitting:   142 / 84  (left arm) Cuff size:   regular  Vitals Entered ByZella Ball Ewing (November 16, 2009 2:45 PM)  O2 Flow:  Room air  CC: left leg swollen/RE   CC:  left leg swollen/RE.  History of Present Illness: here with "sudden" worsening LE edema bilat since last seen 3/17;  exam without edema last vist; denies increase in by mouth fluid intake;  finished all antibx, and overall usually good compliacne with meds and diet but has been eating regular new canned veg's to make soup freq with presumed increased salt intake;  Pt denies CP, sob, doe, wheezing, orthopnea, pnd,  palps, dizziness or syncope .    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) 10)  Leg Pain, Bilateral  (ICD-729.5) 11)  Preventive Health Care  (ICD-V70.0) 12)  Foot Pain  (ICD-729.5) 13)  Myalgia  (ICD-729.1) 14)  Chronic Obstructive Pulmonary Disease, Acute Exacerbation  (ICD-491.21) 15)  Benign Prostatic Hypertrophy  (ICD-600.00) 16)  Coronary Artery Disease  (ICD-414.00) 17)  Peripheral Vascular Disease  (ICD-443.9) 18)  Colonic Polyps, Hx of  (ICD-V12.72) 19)  Allergic Rhinitis  (ICD-477.9) 20)  COPD  (ICD-496) 21)  Asthma  (ICD-493.90) 22)  Rash-nonvesicular  (ICD-782.1) 23)  Hypertension  (ICD-401.9) Matthew)  Hyperlipidemia  (ICD-272.4)  Medications Prior to Update: 1)  Lovastatin 40 Mg  Tabs (Lovastatin) .Marland Kitchen.. 1 By Mouth Once Daily 2)  Exforge Hct  5-160-12.5 Mg Tabs (Amlodipine-Valsartan-Hctz) .Marland Kitchen.. 1po Once Daily 3)  Ecotrin Low Strength 81 Mg  Tbec (Aspirin) .Marland Kitchen.. 1 By Mouth Qd 4)  Symbicort 160-4.5 Mcg/act  Aero (Budesonide-Formoterol Fumarate) .... 2 Puffs Bid 5)  Gabapentin 300 Mg Caps (Gabapentin) .Marland Kitchen.. 1 By Mouth Three Times A Day 6)  Flexeril 5 Mg Tabs (Cyclobenzaprine Hcl) .Marland Kitchen.. 1po Three Times A Day As Needed 7)  Tramadol Hcl 50 Mg Tabs (Tramadol Hcl) .Marland Kitchen.. 1 -2 By Mouth Q 6hrs As Needed Pain 8)  Azithromycin 250 Mg Tabs (Azithromycin) .... 2po Qd For 1 Day, Then 1po Qd For 4days, Then Stop 9)  Gabapentin 300 Mg Caps (Gabapentin) .Marland Kitchen.. 1 By Mouth Two Times A Day  Current Medications (verified): 1)  Lovastatin 40 Mg  Tabs (Lovastatin) .Marland Kitchen.. 1 By Mouth Once Daily 2)  Exforge Hct 5-160-12.5 Mg Tabs (Amlodipine-Valsartan-Hctz) .Marland Kitchen.. 1po Once Daily 3)  Ecotrin Low Strength 81 Mg  Tbec (Aspirin) .Marland Kitchen.. 1 By Mouth Qd 4)  Symbicort 160-4.5 Mcg/act  Aero (Budesonide-Formoterol Fumarate) .... 2 Puffs Bid 5)  Gabapentin 300 Mg Caps (Gabapentin) .Marland Kitchen.. 1 By Mouth Three Times A Day 6)  Flexeril 5 Mg Tabs (Cyclobenzaprine Hcl) .Marland Kitchen.. 1po Three Times A Day As Needed 7)  Tramadol Hcl 50 Mg Tabs (Tramadol Hcl) .Marland Kitchen.. 1 -2 By Mouth Q 6hrs As Needed Pain 8)  Azithromycin 250 Mg Tabs (  Azithromycin) .... 2po Qd For 1 Day, Then 1po Qd For 4days, Then Stop 9)  Gabapentin 300 Mg Caps (Gabapentin) .Marland Kitchen.. 1 By Mouth Two Times A Day 10)  Zaroxolyn 5 Mg Tabs (Metolazone) .Marland Kitchen.. 1 By Mouth Once Daily  Allergies (verified): 1)  ! Pcn 2)  ! 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  , non ill appearing Head:  normocephalic and atraumatic.   Eyes:  vision grossly intact, pupils equal, and pupils round.   Ears:  R ear normal and L ear normal.   Nose:  no external deformity and no nasal discharge.   Mouth:  no gingival abnormalities and pharynx pink and moist.   Neck:  supple and no masses.   Lungs:  normal respiratory effort and normal breath sounds.   Heart:  normal rate and regular rhythm.   Abdomen:  soft, non-tender, and normal bowel sounds.   Msk:  no joint tenderness and no joint swelling.   Extremities:  2-3+ edema 2/3 up to the knees, right worse than left; no ulcers, rash or weeping   Impression & Recommendations:  Problem # 1:  PERIPHERAL EDEMA (ICD-782.3)  His updated medication list for this problem includes:    Exforge Hct 5-160-12.5 Mg Tabs (Amlodipine-valsartan-hctz) .Marland Kitchen... 1po once daily    Zaroxolyn 5 Mg Tabs (Metolazone) .Marland Kitchen... 1 by mouth once daily right > left, moderate today;  to check renal fxn today, but suspect most likely due to recetn increased sodium intake;  will add zaroxyln 5 mg per day for 5 days only, and d/c added salt, rov 2 wks  Orders: TLB-BMP (Basic Metabolic Panel-BMET) (80048-METABOL)  Problem # 2:  HYPERTENSION (ICD-401.9)  His updated medication list for this problem includes:    Exforge Hct 5-160-12.5 Mg Tabs (Amlodipine-valsartan-hctz) .Marland Kitchen... 1po once daily    Zaroxolyn 5 Mg Tabs (Metolazone) .Marland Kitchen... 1 by mouth once daily  BP today: 142/84 Prior BP: 144/86 (11/08/2009)  Labs Reviewed: K+: 4.0 (06/05/2009) Creat: : 1.0 (06/05/2009)   Chol: 172 (12/15/2008)   HDL:  57.90 (12/15/2008)   LDL: 98 (12/15/2008)   TG: 82.0 (12/15/2008) stable overall by hx and exam, ok to continue meds/tx as is   Problem # 3:  COPD (ICD-496)  His updated medication list for this problem includes:    Symbicort 160-4.5 Mcg/act Aero (Budesonide-formoterol fumarate) .Marland Kitchen... 2 puffs bid stable overall by hx and exam, ok to continue meds/tx as is, infectious symtpoms symtpoms resolved.  Complete Medication List: 1)  Lovastatin 40 Mg Tabs (Lovastatin) .Marland Kitchen.. 1 by mouth once daily 2)  Exforge Hct 5-160-12.5 Mg Tabs (Amlodipine-valsartan-hctz) .Marland Kitchen.. 1po once daily 3)  Ecotrin Low Strength 81 Mg Tbec (Aspirin) .Marland Kitchen.. 1 by mouth qd 4)  Symbicort 160-4.5 Mcg/act Aero (Budesonide-formoterol fumarate) .... 2 puffs bid 5)  Gabapentin 300 Mg Caps (Gabapentin) .Marland Kitchen.. 1 by mouth three times a day 6)  Flexeril 5 Mg Tabs (Cyclobenzaprine hcl) .Marland Kitchen.. 1po three times a day as needed 7)  Tramadol Hcl 50 Mg Tabs (Tramadol hcl) .Marland Kitchen.. 1 -2 by mouth q 6hrs as needed pain 8)  Azithromycin 250 Mg Tabs (Azithromycin) .... 2po qd for 1 day, then 1po qd for 4days, then stop 9)  Gabapentin 300 Mg Caps (Gabapentin) .Marland Kitchen.. 1 by mouth two times a day 10)  Zaroxolyn 5 Mg Tabs (Metolazone) .Marland Kitchen.. 1 by mouth once daily  Patient Instructions: 1)  Please take all new medications as prescribed - the extra fluid pill (this was sent to your pharmacy [facility]) 2)  Please go to the Lab in the basement for your blood  tests today  3)  Continue all previous medications as before this visit  4)  Please stop the canned vegatables and any added salt in your diet 5)  Please schedule a follow-up appointment in 2 weeks. Prescriptions: ZAROXOLYN 5 MG TABS (METOLAZONE) 1 by mouth once daily  #5 x 0   Entered and Authorized by:   Corwin Levins MD   Signed by:   Corwin Levins MD on 11/16/2009   Method used:   Electronically to        [facility] [phone]* (retail)       [address]       [place], [place]   [address]       Ph: [phone]       Fax: [phone]   RxID:   [id number]

## 2010-09-24 NOTE — Assessment & Plan Note (Signed)
Summary: 2 wk  f/u // # / cd   Vital Signs:  Patient profile:   77 year old male Height:      68 inches Weight:      169.50 pounds BMI:     25.87 O2 Sat:      96 % on Room air Temp:     98.6 degrees F oral Pulse rate:   69 / minute BP sitting:   110 / 74  (left arm) Cuff size:   regular  Vitals Entered ByZella Ball Ewing (November 30, 2009 3:10 PM)  O2 Flow:  Room air  Preventive Care Screening     declines pneumovax   CC: 2 week followup/RE   CC:  2 week followup/RE.  History of Present Illness: here to f/u ; overall doing well,  finished the zaroxyln and antibx with no further infectious s/s, and no return of the LE edema as he has also rigorously also stayed away from salty food and extra food;  Pt denies CP, sob, doe, wheezing, orthopnea, pnd, worsening LE edema, palps, dizziness or syncope   Pt denies new neuro symptoms such as headache, facial or extremity weakness   Did work in the yard yesterday standing for several hours, and now today with acute onset pain, swelling to the right ankle only, mild overall, not overly tender to touch and no truama, turned ankle , fever or hx of gout.    Here also with daughter, who is supportive in that he seems ot have developed worsening  depressive symptoms with fatigue, low mood, anhedonia and some irrtabiltiy in the past few weeks.  No suicidal ideation, no panic.  No appetite, less interest in food and lost several lbs ? not fluid in the past 2 wks.    Here for wellness Diet: Heart Healthy or DM if diabetic Physical Activities: Sedentary Depression/mood screen: as above Hearing: Intact bilateral Visual Acuity: Grossly normal ADL's: Capable  Fall Risk: None Home Safety: Good End-of-Life Planning: Advance directive - Full code/I agree   Problems Prior to Update: 1)  Special Screening Malig Neoplasms Other Sites  (ICD-V76.49) 2)  Depression  (ICD-311) 3)  Fatigue  (ICD-780.79) 4)  Preventive Health Care  (ICD-V70.0) 5)  Peripheral  Edema  (ICD-782.3) 6)  Sciatica, Left  (ICD-724.3) 7)  Back Pain  (ICD-724.5) 8)  Sinusitis- Acute-nos  (ICD-461.9) 9)  Shoulder Pain, Left  (ICD-719.41) 10)  Hemoptysis  (ICD-786.3) 11)  Hepatotoxicity, Drug-induced, Risk of  (ICD-V58.69) 12)  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) 13)  Leg Pain, Bilateral  (ICD-729.5) 14)  Preventive Health Care  (ICD-V70.0) 15)  Foot Pain  (ICD-729.5) 16)  Myalgia  (ICD-729.1) 17)  Chronic Obstructive Pulmonary Disease, Acute Exacerbation  (ICD-491.21) 18)  Benign Prostatic Hypertrophy  (ICD-600.00) 19)  Coronary Artery Disease  (ICD-414.00) 20)  Peripheral Vascular Disease  (ICD-443.9) 21)  Colonic Polyps, Hx of  (ICD-V12.72) 22)  Allergic Rhinitis  (ICD-477.9) 23)  COPD  (ICD-496) 24)  Asthma  (ICD-493.90) 25)  Rash-nonvesicular  (ICD-782.1) 26)  Hypertension  (ICD-401.9) 27)  Hyperlipidemia  (ICD-272.4)  Medications Prior to Update: 1)  Lovastatin 40 Mg  Tabs (Lovastatin) .Marland Kitchen.. 1 By Mouth Once Daily 2)  Exforge Hct 5-160-12.5 Mg Tabs (Amlodipine-Valsartan-Hctz) .Marland Kitchen.. 1po Once Daily 3)  Ecotrin Low Strength 81 Mg  Tbec (Aspirin) .Marland Kitchen.. 1 By Mouth Qd 4)  Symbicort 160-4.5 Mcg/act  Aero (Budesonide-Formoterol Fumarate) .... 2 Puffs Bid 5)  Gabapentin 300 Mg Caps (Gabapentin) .Marland Kitchen.. 1 By Mouth Three Times A  Day 6)  Flexeril 5 Mg Tabs (Cyclobenzaprine Hcl) .Marland Kitchen.. 1po Three Times A Day As Needed 7)  Tramadol Hcl 50 Mg Tabs (Tramadol Hcl) .Marland Kitchen.. 1 -2 By Mouth Q 6hrs As Needed Pain 8)  Azithromycin 250 Mg Tabs (Azithromycin) .... 2po Qd For 1 Day, Then 1po Qd For 4days, Then Stop 9)  Gabapentin 300 Mg Caps (Gabapentin) .Marland Kitchen.. 1 By Mouth Two Times A Day 10)  Zaroxolyn 5 Mg Tabs (Metolazone) .Marland Kitchen.. 1 By Mouth Once Daily  Current Medications (verified): 1)  Lovastatin 40 Mg  Tabs (Lovastatin) .Marland Kitchen.. 1 By Mouth Once Daily 2)  Exforge Hct 5-160-12.5 Mg Tabs (Amlodipine-Valsartan-Hctz) .Marland Kitchen.. 1po Once Daily 3)  Ecotrin Low Strength 81 Mg  Tbec (Aspirin) .Marland Kitchen.. 1 By  Mouth Qd 4)  Symbicort 160-4.5 Mcg/act  Aero (Budesonide-Formoterol Fumarate) .... 2 Puffs Bid 5)  Flexeril 5 Mg Tabs (Cyclobenzaprine Hcl) .Marland Kitchen.. 1po Three Times A Day As Needed 6)  Tramadol Hcl 50 Mg Tabs (Tramadol Hcl) .Marland Kitchen.. 1 -2 By Mouth Q 6hrs As Needed Pain 7)  Gabapentin 300 Mg Caps (Gabapentin) .Marland Kitchen.. 1 By Mouth Two Times A Day 8)  Citalopram Hydrobromide 10 Mg Tabs (Citalopram Hydrobromide) .Marland Kitchen.. 1 By Mouth Once Daily  Allergies (verified): 1)  ! Pcn 2)  ! 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 (10/27/2007)  Past Medical History: Hyperlipidemia Hypertension Asthma COPD/chronic bronchitic component byssinosis Hemoptysis- intermittent, chronic Allergic rhinitis Colonic polyps, hx of - adenomatous recurrent boil Peripheral vascular disease - severe - refuses surgery Coronary artery disease Benign prostatic hypertrophy lumbar disc disease Depression  Review of Systems  The patient denies anorexia, fever, weight gain, vision loss, decreased hearing, hoarseness, chest pain, syncope, dyspnea on exertion, peripheral edema, prolonged cough, headaches, hemoptysis, abdominal pain, melena, hematochezia, severe indigestion/heartburn, hematuria, suspicious skin lesions, difficulty walking, abnormal bleeding, enlarged lymph nodes, and angioedema.         all otherwise negative per pt -    Physical Exam  General:  alert and well-developed.   Head:  normocephalic and atraumatic.   Eyes:  vision grossly intact, pupils equal, and pupils round.   Ears:  R ear normal and L ear normal.   Nose:  no external  deformity and no nasal discharge.   Mouth:  no gingival abnormalities and pharynx pink and moist.   Neck:  supple and no masses.   Lungs:  normal respiratory effort and normal breath sounds.   Heart:  normal rate and regular rhythm.   Abdomen:  soft, non-tender, and normal bowel sounds.   Msk:  right ankle with mild tender effusion but FROM Extremities:  no edema, no erythema  Neurologic:  cranial nerves II-XII intact and strength normal in all extremities.   Skin:  color normal and no rashes.   Psych:  depressed affect and slightly anxious.     Impression & Recommendations:  Problem # 1:  Preventive Health Care (ICD-V70.0)  Overall doing well, age appropriate education and counseling updated and referral for appropriate preventive services done unless declined, immunizations up to date or declined, diet counseling done if overweight, urged to quit smoking if smokes , most recent labs reviewed and current ordered if appropriate, ecg  reviewed or declined (interpretation per ECG scanned in the EMR if done); information regarding Medicare Prevention requirements given if appropriate   Orders: First annual wellness visit with prevention plan  (Z6109)  Problem # 2:  PERIPHERAL EDEMA (ICD-782.3)  The following medications were removed from the medication list:    Zaroxolyn 5 Mg Tabs (Metolazone) .Marland Kitchen... 1 by mouth once daily His updated medication list for this problem includes:    Exforge Hct 5-160-12.5 Mg Tabs (Amlodipine-valsartan-hctz) .Marland Kitchen... 1po once daily resolved - stable overall by hx and exam, ok to continue meds/tx as is   Problem # 3:  HYPERTENSION (ICD-401.9)  The following medications were removed from the medication list:    Zaroxolyn 5 Mg Tabs (Metolazone) .Marland Kitchen... 1 by mouth once daily His updated medication list for this problem includes:    Exforge Hct 5-160-12.5 Mg Tabs (Amlodipine-valsartan-hctz) .Marland Kitchen... 1po once daily  BP today: 110/74 Prior BP: 142/84  (11/16/2009)  Labs Reviewed: K+: 4.0 (11/16/2009) Creat: : 0.9 (11/16/2009)   Chol: 172 (12/15/2008)   HDL: 57.90 (12/15/2008)   LDL: 98 (12/15/2008)   TG: 82.0 (12/15/2008) stable overall by hx and exam, ok to continue meds/tx as is   Problem # 4:  HYPERLIPIDEMIA (ICD-272.4)  His updated medication list for this problem includes:    Lovastatin 40 Mg Tabs (Lovastatin) .Marland Kitchen... 1 by mouth once daily  Orders: TLB-Lipid Panel (80061-LIPID)  Labs Reviewed: SGOT: 19 (06/05/2009)   SGPT: 18 (06/05/2009)   HDL:57.90 (12/15/2008), 67.6 (06/22/2008)  LDL:98 (12/15/2008), 102 (60/45/4098)  Chol:172 (12/15/2008), 180 (06/22/2008)  Trig:82.0 (12/15/2008), 50 (06/22/2008) stable overall by hx and exam, ok to continue meds/tx as is   Problem # 5:  DEPRESSION (ICD-311)  His updated medication list for this problem includes:    Citalopram Hydrobromide 10 Mg Tabs (Citalopram hydrobromide) .Marland Kitchen... 1 by mouth once daily treat as above, f/u any worsening signs or symptoms   Orders: Prescription Created Electronically [phone])  Problem # 6:  FATIGUE (ICD-780.79) exam benign, to check labs below; follow with expectant management , I suspect relatd to above  Orders: T-Vitamin D (25-Hydroxy) (78295-62130) TLB-BMP (Basic Metabolic Panel-BMET) (80048-METABOL) TLB-CBC Platelet - w/Differential (85025-CBCD) TLB-Hepatic/Liver Function Pnl (80076-HEPATIC) TLB-TSH (Thyroid Stimulating Hormone) (84443-TSH) TLB-Sedimentation Rate (ESR) (85652-ESR) TLB-IBC Pnl (Iron/FE;Transferrin) (83550-IBC) TLB-B12 + Folate Pnl (82746_82607-B12/FOL) TLB-Udip ONLY (81003-UDIP)  Problem # 7:  ANKLE PAIN, RIGHT (ICD-719.47) c/w DJD, tylenol as needed ok  Complete Medication List: 1)  Lovastatin 40 Mg Tabs (Lovastatin) .Marland Kitchen.. 1 by mouth once daily 2)  Exforge Hct 5-160-12.5 Mg Tabs (Amlodipine-valsartan-hctz) .Marland Kitchen.. 1po once daily 3)  Ecotrin Low Strength 81 Mg Tbec (Aspirin) .Marland Kitchen.. 1 by mouth qd 4)  Symbicort 160-4.5 Mcg/act Aero  (Budesonide-formoterol fumarate) .... 2 puffs bid 5)  Flexeril 5 Mg Tabs (Cyclobenzaprine hcl) .Marland Kitchen.. 1po three times a day as needed 6)  Tramadol Hcl 50 Mg Tabs (Tramadol hcl) .Marland Kitchen.. 1 -2 by mouth q 6hrs as needed pain 7)  Gabapentin 300 Mg Caps (Gabapentin) .Marland Kitchen.. 1 by mouth two times a day 8)  Citalopram Hydrobromide 10 Mg Tabs (Citalopram hydrobromide) .Marland Kitchen.. 1 by mouth once daily  Other Orders: TLB-PSA (Prostate Specific Antigen) (84153-PSA)  Patient Instructions: 1)  Please take all new medications as prescribed  2)  Continue all previous medications as before this visit  3)  Please go to the Lab in the basement for your blood and/or urine tests today 4)  Please schedule a follow-up appointment in 6 months. Prescriptions: CITALOPRAM HYDROBROMIDE 10 MG TABS (CITALOPRAM HYDROBROMIDE) 1  by mouth once daily  #30 x 11   Entered and Authorized by:   Corwin Levins MD   Signed by:   Corwin Levins MD on 11/30/2009   Method used:   Electronically to        [facility] [phone]* (retail)       [address]       [place], [place]  [address]       Ph: [phone]       Fax: [phone]   RxID:   [id number]

## 2010-09-24 NOTE — Letter (Signed)
Summary: Out of Work  [facility]  [address] [place], [place] [address]   Phone: [phone]  Fax: [phone]    October 10, 2009   Employee:  Matthew Gray    To Whom It May Concern:   For Medical reasons, please excuse the above named employee from work for the following dates:  Start:   Oct 09, 2009  End:   Oct 14, 2009   ---   to return to work without restriction Mon Oct 15, 2009  If you need additional information, please feel free to contact our office.         Sincerely,    Corwin Levins MD

## 2010-09-24 NOTE — Progress Notes (Signed)
Summary: nose bleed  Phone Note Call from Patient   Caller: Patient Walk-In Summary of Call: pt walked in stating that he is experiencing excessive nose bleeds and he thinks it is due to prednisone. please advise. call back [phone] (h) or [phone] (c). Initial call taken by: Margaret Pyle, CMA,  October 11, 2009 10:47 AM  Follow-up for Phone Call        no, prednisone is extremely unlikely to cause this;  would he like referral to ENT? Follow-up by: Corwin Levins MD,  October 11, 2009 12:48 PM  Additional Follow-up for Phone Call Additional follow up Details #1::        pt states that was bleeding a little bit this morning after blowing his nose. no bleeding since. no pain. I advised pt that this was probably directly related to blowing his nose and that he could still return to work Monday as advised by MD (pt was concerned about this) and call back if nose bleeds contiune and MD can make referral to ENT Additional Follow-up by: Margaret Pyle, CMA,  October 11, 2009 1:39 PM    Additional Follow-up for Phone Call Additional follow up Details #2::    I agree, Follow-up by: Corwin Levins MD,  October 11, 2009 2:25 PM

## 2010-09-24 NOTE — Assessment & Plan Note (Signed)
Summary: RX CLARIFICATION-LB   Vital Signs:  Patient profile:   77 year old male Height:      67 inches Weight:      170 pounds BMI:     26.72 O2 Sat:      98 % on Room air Temp:     97 degrees F oral Pulse rate:   70 / minute BP sitting:   142 / 88  (left arm) Cuff size:   regular  Vitals Entered ByZella Ball Ewing (October 10, 2009 3:56 PM)  O2 Flow:  Room air CC: rx clarification/RE   CC:  rx clarification/RE.  History of Present Illness: overall improved since last visit, accidently took an extra dose of prednisone yesterday and sister asked him to come in to be checked;  denies worsening polys or blurred vision, fluid retention or other symptoms,  denies fever, and overall back pain sinus pain  at least "75%" improved.  Overall good complaicne with all meds, and tolerating well.  Denies worsening fever, ST, headache, malaise, weakness, CP, sob, doe, wheezing, abd pain, diarrhea, rash or joint pains.  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) 7)  Leg Pain, Bilateral  (ICD-729.5) 8)  Preventive Health Care  (ICD-V70.0) 9)  Foot Pain  (ICD-729.5) 10)  Myalgia  (ICD-729.1) 11)  Chronic Obstructive Pulmonary Disease, Acute Exacerbation  (ICD-491.21) 12)  Benign Prostatic Hypertrophy  (ICD-600.00) 13)  Coronary Artery Disease  (ICD-414.00) 14)  Peripheral Vascular Disease  (ICD-443.9) 15)  Colonic Polyps, Hx of  (ICD-V12.72) 16)  Allergic Rhinitis  (ICD-477.9) 17)  COPD  (ICD-496) 18)  Asthma  (ICD-493.90) 19)  Rash-nonvesicular  (ICD-782.1) 20)  Hypertension  (ICD-401.9) 21)  Hyperlipidemia  (ICD-272.4)  Medications Prior to Update: 1)  Lovastatin 40 Mg  Tabs  (Lovastatin) .Marland Kitchen.. 1 By Mouth Once Daily 2)  Exforge 5-160 Mg Tabs (Amlodipine Besylate-Valsartan) .... Take 1 Tablet By Mouth Once A Day 3)  Ecotrin Low Strength 81 Mg  Tbec (Aspirin) .Marland Kitchen.. 1 By Mouth Qd 4)  Symbicort 160-4.5 Mcg/act  Aero (Budesonide-Formoterol Fumarate) .... 2 Puffs Bid 5)  Gabapentin 300 Mg Caps (Gabapentin) .Marland Kitchen.. 1 By Mouth Three Times A Day 6)  Flexeril 5 Mg Tabs (Cyclobenzaprine Hcl) .Marland Kitchen.. 1po Three Times A Day As Needed 7)  Clarithromycin 500 Mg Tabs (Clarithromycin) .Marland Kitchen.. 1po Two Times A Day 8)  Hydrocodone-Homatropine 5-1.5 Mg/40ml Syrp (Hydrocodone-Homatropine) .Marland Kitchen.. 1 Tsp By Mouth Q 6 Hrs As Needed Cough 9)  Tramadol Hcl 50 Mg Tabs (Tramadol Hcl) .Marland Kitchen.. 1 -2 By Mouth Q 6hrs As Needed Pain 10)  Prednisone 10 Mg Tabs (Prednisone) .... 3po Qd For 3days, Then 2po Qd For 3days, Then 1po Qd For 3days, Then Stop  Current Medications (verified): 1)  Lovastatin 40 Mg  Tabs (Lovastatin) .Marland Kitchen.. 1 By Mouth Once Daily 2)  Exforge 5-160 Mg Tabs (Amlodipine Besylate-Valsartan) .... Take 1 Tablet By Mouth Once A Day 3)  Ecotrin Low Strength 81 Mg  Tbec (Aspirin) .Marland Kitchen.. 1 By Mouth Qd 4)  Symbicort 160-4.5 Mcg/act  Aero (Budesonide-Formoterol Fumarate) .... 2 Puffs Bid 5)  Gabapentin 300 Mg Caps (Gabapentin) .Marland Kitchen.. 1 By Mouth Three Times A Day 6)  Flexeril 5 Mg Tabs (Cyclobenzaprine  Hcl) .... 1po Three Times A Day As Needed 7)  Clarithromycin 500 Mg Tabs (Clarithromycin) .Marland Kitchen.. 1po Two Times A Day 8)  Hydrocodone-Homatropine 5-1.5 Mg/66ml Syrp (Hydrocodone-Homatropine) .Marland Kitchen.. 1 Tsp By Mouth Q 6 Hrs As Needed Cough 9)  Tramadol Hcl 50 Mg Tabs (Tramadol Hcl) .Marland Kitchen.. 1 -2 By Mouth Q 6hrs As Needed Pain 10)  Prednisone 10 Mg Tabs (Prednisone) .... 3po Qd For 3days, Then 2po Qd For 3days, Then 1po Qd For 3days, Then Stop  Allergies (verified): 1)  ! Pcn 2)  ! 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   Head:  normocephalic and atraumatic.   Eyes:  vision grossly intact, pupils equal, and pupils round.   Ears:  bilat tm's midl red, sinus nontender Nose:  nasal dischargemucosal pallor and mucosal edema.   Mouth:  pharyngeal erythema and fair dentition.   Neck:  supple and no masses.   Lungs:  normal respiratory effort, R decreased breath sounds, and L decreased breath sounds.  but no wheezing or rales Heart:  regular rhythm and no murmur.   Abdomen:  soft, non-tender, and normal bowel sounds.   Msk:  no joint tenderness and no joint swelling., spine nontender Extremities:  no edema, no erythema  Neurologic:  cranial nerves II-XII intact and strength normal in all extremities.     Impression & Recommendations:  Problem # 1:  SINUSITIS- ACUTE-NOS (ICD-461.9)  His updated medication list for this problem includes:    Clarithromycin 500 Mg Tabs (Clarithromycin) .Marland Kitchen... 1po two times a day    Hydrocodone-homatropine 5-1.5 Mg/78ml Syrp (Hydrocodone-homatropine) .Marland Kitchen... 1 tsp by mouth q 6 hrs as needed cough improved, Continue all previous medications as before this visit   Problem # 2:  BACK PAIN (ICD-724.5)  His updated medication list for this problem includes:    Ecotrin Low Strength 81 Mg Tbec (Aspirin)  .Marland Kitchen... 1 by mouth qd    Flexeril 5 Mg Tabs (Cyclobenzaprine hcl) .Marland Kitchen... 1po three times a day as needed    Tramadol Hcl 50 Mg Tabs (Tramadol hcl) .Marland Kitchen... 1 -2 by mouth q 6hrs as needed pain improved, Continue all previous medications as before this visit   Problem # 3:  HYPERTENSION (ICD-401.9)  His updated medication list for this problem includes:    Exforge 5-160 Mg Tabs (Amlodipine besylate-valsartan) .Marland Kitchen... Take 1 tablet by mouth once a day  BP today: 142/88 Prior BP: 158/98 (10/08/2009)  Labs Reviewed: K+: 4.0 (06/05/2009) Creat: : 1.0 (06/05/2009)   Chol: 172 (12/15/2008)   HDL: 57.90 (12/15/2008)   LDL: 98 (12/15/2008)   TG: 82.0 (12/15/2008) mild elev today, likely situational, ok to follow, continue same treatment  as he is improved  Problem # 4:  COPD (ICD-496)  His updated medication list for this problem includes:    Symbicort 160-4.5 Mcg/act Aero (Budesonide-formoterol fumarate) .Marland Kitchen... 2 puffs bid stable overall by hx  and exam, ok to continue meds/tx as is   Complete Medication List: 1)  Lovastatin 40 Mg Tabs (Lovastatin) .Marland Kitchen.. 1 by mouth once daily 2)  Exforge 5-160 Mg Tabs (Amlodipine besylate-valsartan) .... Take 1 tablet by mouth once a day 3)  Ecotrin Low Strength 81 Mg Tbec (Aspirin) .Marland Kitchen.. 1 by mouth qd 4)  Symbicort 160-4.5 Mcg/act Aero (Budesonide-formoterol fumarate) .... 2 puffs bid 5)  Gabapentin 300 Mg Caps (Gabapentin) .Marland Kitchen.. 1 by mouth three times a day 6)  Flexeril 5 Mg Tabs (Cyclobenzaprine hcl) .Marland Kitchen.. 1po three times a day as needed 7)  Clarithromycin 500 Mg Tabs (Clarithromycin) .Marland Kitchen.. 1po two times a day 8)  Hydrocodone-homatropine 5-1.5 Mg/69ml Syrp (Hydrocodone-homatropine) .Marland Kitchen.. 1 tsp by mouth q 6 hrs as needed cough 9)  Tramadol Hcl 50 Mg Tabs (Tramadol hcl) .Marland Kitchen.. 1 -2 by mouth q 6hrs as needed pain 10)  Prednisone 10 Mg Tabs (Prednisone) .... 3po qd for 3days, then 2po qd for 3days, then 1po qd for 3days, then stop  Patient Instructions: 1)  please finish  your medications as is from the last visit 2)  Continue all previous medications as before this visit (the ones you take all the time) 3)  you are given the work note today 4)  Please schedule a follow-up appointment in 1 month with CPX labs

## 2010-09-24 NOTE — Assessment & Plan Note (Signed)
Summary: 6 mos f/u / #/ cd   Vital Signs:  Patient profile:   77 year old male Height:      69 inches Weight:      165 pounds BMI:     24.45 O2 Sat:      97 % on Room air Temp:     98.4 degrees F oral Pulse rate:   67 / minute BP sitting:   132 / 88  (left arm) Cuff size:   regular  Vitals Entered By: Zella Ball Ewing CMA (AAMA) (June 04, 2010 12:03 PM)  O2 Flow:  Room air CC: 6 month ROV/RE   CC:  6 month ROV/RE.  History of Present Illness: here to f/u routine with family present today - overall doing well, Pt denies CP, worsening sob, doe, wheezing, orthopnea, pnd, worsening LE edema, palps, dizziness or syncope  Pt denies new neuro symptoms such as headache, facial or extremity weakness   Pt denies polydipsia, polyuria, Overall good compliance with meds, trying to follow low chol diet, wt stable, little excercise however .  Does have signficatn nasal allergy symptoms with mild post nasal gtt and nonprod cough.  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) 14)  Leg Pain, Bilateral  (ICD-729.5) 15)  Preventive Health Care  (ICD-V70.0) 16)  Foot Pain  (ICD-729.5) 17)  Myalgia  (ICD-729.1) 18)  Chronic Obstructive Pulmonary Disease, Acute Exacerbation  (ICD-491.21) 19)  Benign Prostatic Hypertrophy  (ICD-600.00) 20)  Coronary Artery Disease  (ICD-414.00) 21)  Peripheral Vascular Disease  (ICD-443.9) 22)  Colonic Polyps,  Hx of  (ICD-V12.72) 23)  Allergic Rhinitis  (ICD-477.9) 24)  COPD  (ICD-496) 25)  Asthma  (ICD-493.90) 26)  Rash-nonvesicular  (ICD-782.1) 27)  Hypertension  (ICD-401.9) 28)  Hyperlipidemia  (ICD-272.4)  Medications Prior to Update: 1)  Lovastatin 40 Mg  Tabs (Lovastatin) .Marland Kitchen.. 1 By Mouth Once Daily 2)  Exforge Hct 5-160-12.5 Mg Tabs (Amlodipine-Valsartan-Hctz) .Marland Kitchen.. 1po Once Daily 3)  Ecotrin Low Strength 81 Mg  Tbec (Aspirin) .Marland Kitchen.. 1 By Mouth Qd 4)  Symbicort 160-4.5 Mcg/act  Aero (Budesonide-Formoterol Fumarate) .... 2 Puffs Bid 5)  Tramadol Hcl 50 Mg Tabs (Tramadol Hcl) .Marland Kitchen.. 1 -2 By Mouth Q 6hrs As Needed Pain 6)  Citalopram Hydrobromide 10 Mg Tabs (Citalopram Hydrobromide) .Marland Kitchen.. 1 By Mouth Once Daily 7)  Proair Hfa 108 (90 Base) Mcg/act Aers (Albuterol Sulfate) .... 2 Puffs Four Times A Day As Needed Rescue Inhaler  Current Medications (verified): 1)  Lovastatin 40 Mg  Tabs (Lovastatin) .Marland Kitchen.. 1 By Mouth Once Daily 2)  Exforge Hct 5-160-12.5 Mg Tabs (Amlodipine-Valsartan-Hctz) .Marland Kitchen.. 1po Once Daily 3)  Ecotrin Low Strength 81 Mg  Tbec (Aspirin) .Marland Kitchen.. 1 By Mouth Qd 4)  Symbicort 160-4.5 Mcg/act  Aero (Budesonide-Formoterol Fumarate) .... 2 Puffs Bid 5)  Tramadol Hcl 50 Mg Tabs (Tramadol Hcl) .Marland Kitchen.. 1 -2 By Mouth Q 6hrs As Needed Pain 6)  Citalopram Hydrobromide 10 Mg Tabs (Citalopram  Hydrobromide) .Marland Kitchen.. 1 By Mouth Once Daily 7)  Proair Hfa 108 (90 Base) Mcg/act Aers (Albuterol Sulfate) .... 2 Puffs Four Times A Day As Needed Rescue Inhaler 8)  Fluticasone Propionate 50 Mcg/act Susp (Fluticasone Propionate) .... 2 Spray/side Once Daily 9)  Fexofenadine Hcl 180 Mg Tabs (Fexofenadine Hcl) .Marland Kitchen.. 1po Once Daily For Allergies  Allergies (verified): 1)  ! Pcn 2)  ! 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   Head:  normocephalic and atraumatic.   Eyes:  vision grossly intact, pupils equal, and pupils round.   Ears:  R ear normal and L ear normal.   Nose:  nasal dischargemucosal pallor and mucosal edema.   Mouth:  no gingival abnormalities and pharynx pink and moist.   Neck:  supple and no masses.   Lungs:  normal respiratory effort and normal breath sounds.   Heart:  normal rate and regular rhythm.   Extremities:  no edema, no erythema    Impression & Recommendations:  Problem # 1:  VITAMIN D DEFICIENCY (ICD-268.9) to start the vit d 2000 units otc once daily ; recent labs reviewed with pt and family  Problem # 2:  ALLERGIC RHINITIS (ICD-477.9)  His updated medication list for this problem includes:    Fluticasone Propionate 50 Mcg/act Susp (Fluticasone propionate) .Marland Kitchen... 2 spray/side once daily    Fexofenadine Hcl 180 Mg Tabs (Fexofenadine hcl) .Marland Kitchen... 1po once daily for allergies treat as above, f/u any worsening signs or symptoms   Problem # 3:  HYPERTENSION (ICD-401.9)  His updated medication list for this problem includes:    Exforge Hct 5-160-12.5 Mg Tabs (Amlodipine-valsartan-hctz) .Marland Kitchen... 1po once daily  BP today: 132/88 Prior BP: 136/82 (12/03/2009)  Labs Reviewed: K+: 4.1 (11/30/2009) Creat: : 1.0 (11/30/2009)   Chol: 186 (11/30/2009)   HDL: 75.60 (11/30/2009)   LDL: 102 (11/30/2009)   TG: 41.0  (11/30/2009) stable overall by hx and exam, ok to continue meds/tx as is   Problem # 4:  HYPERLIPIDEMIA (ICD-272.4)  His updated medication list for this problem includes:    Lovastatin 40 Mg Tabs (Lovastatin) .Marland Kitchen... 1 by mouth once daily  Labs Reviewed: SGOT: 15 (11/30/2009)   SGPT: 12 (11/30/2009)   HDL:75.60 (11/30/2009), 57.90 (12/15/2008)  LDL:102 (11/30/2009), 98 (16/05/9603)  Chol:186 (11/30/2009), 172 (12/15/2008)  Trig:41.0 (11/30/2009), 82.0 (12/15/2008) stable overall by hx and exam, ok to continue meds/tx as is   Complete Medication List: 1)  Lovastatin 40 Mg Tabs (Lovastatin) .Marland Kitchen.. 1 by mouth once daily 2)  Exforge Hct 5-160-12.5 Mg Tabs (Amlodipine-valsartan-hctz) .Marland Kitchen.. 1po once daily 3)  Ecotrin Low Strength 81 Mg Tbec (Aspirin) .Marland Kitchen.. 1 by mouth qd 4)  Symbicort 160-4.5 Mcg/act Aero (Budesonide-formoterol fumarate) .... 2 puffs bid 5)  Tramadol Hcl 50 Mg Tabs (Tramadol hcl) .Marland Kitchen.. 1 -2 by mouth q 6hrs as needed pain 6)  Citalopram Hydrobromide 10 Mg Tabs (  Citalopram hydrobromide) .Marland Kitchen.. 1 by mouth once daily 7)  Proair Hfa 108 (90 Base) Mcg/act Aers (Albuterol sulfate) .... 2 puffs four times a day as needed rescue inhaler 8)  Fluticasone Propionate 50 Mcg/act Susp (Fluticasone propionate) .... 2 spray/side once daily 9)  Fexofenadine Hcl 180 Mg Tabs (Fexofenadine hcl) .Marland Kitchen.. 1po once daily for allergies  Patient Instructions: 1)  please take vit D 2000 units per day (OTC) 2)  Please take all new medications as prescribed - the flonase and generic allegra 3)  Continue all previous medications as before this visit 4)  Please schedule a follow-up appointment in 6 months for your "yearly medicare exam" 5)  Please fill out the Wellness Form to bring to your Next Visit Prescriptions: FEXOFENADINE HCL 180 MG TABS (FEXOFENADINE HCL) 1po once daily for allergies  #30 x 11   Entered and Authorized by:   Corwin Levins MD   Signed by:   Corwin Levins MD on 06/04/2010   Method used:   Print  then Give to Patient   RxID:   [id number] FLUTICASONE PROPIONATE 50 MCG/ACT SUSP (FLUTICASONE PROPIONATE) 2 spray/side once daily  #1 x 11   Entered and Authorized by:   Corwin Levins MD   Signed by:   Corwin Levins MD on 06/04/2010   Method used:   Print then Give to Patient   RxID:   [id number]

## 2010-09-24 NOTE — Assessment & Plan Note (Signed)
Summary: 1 mos f/u // # /cd   Vital Signs:  Patient profile:   77 year old male Height:      68 inches Weight:      172.13 pounds BMI:     26.27 O2 Sat:      98 % on Room air Temp:     98.1 degrees F oral Pulse rate:   80 / minute BP sitting:   144 / 86  (left arm) Cuff size:   regular  Vitals Entered ByZella Ball Ewing (November 08, 2009 3:32 PM)  O2 Flow:  Room air  Preventive Care Screening     declines tetanus, pneumovax  CC: 1 mo followup/RE   CC:  1 mo followup/RE.  History of Present Illness: overall improved without sinus symptoms at this time;  Pt denies CP, orthopnea, pnd, worsening LE edema, palps, dizziness or syncope   Pt denies new neuro symptoms such as headache, facial or extremity weakness , but has had 3 days onset prod cough greenish sputpum without wheezing but with low grade temp.  Meds are expensive, overall good compliance.  Has left lower back discomfort off adn on for months without fever, bowel or bladder change, but has some mild left sciatica type pain, without numbness or weakness, or gait problem.     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) 9)  Leg Pain, Bilateral  (ICD-729.5) 10)  Preventive Health Care  (ICD-V70.0) 11)  Foot Pain  (ICD-729.5) 12)  Myalgia  (ICD-729.1) 13)  Chronic Obstructive Pulmonary Disease, Acute Exacerbation  (ICD-491.21) 14)  Benign Prostatic Hypertrophy  (ICD-600.00) 15)  Coronary Artery Disease  (ICD-414.00) 16)  Peripheral Vascular Disease  (ICD-443.9) 17)  Colonic Polyps, Hx of  (ICD-V12.72) 18)  Allergic Rhinitis  (ICD-477.9) 19)  COPD  (ICD-496) 20)  Asthma  (ICD-493.90) 21)  Rash-nonvesicular  (ICD-782.1) 22)  Hypertension   (ICD-401.9) 23)  Hyperlipidemia  (ICD-272.4)  Medications Prior to Update: 1)  Lovastatin 40 Mg  Tabs (Lovastatin) .Marland Kitchen.. 1 By Mouth Once Daily 2)  Exforge 5-160 Mg Tabs (Amlodipine Besylate-Valsartan) .... Take 1 Tablet By Mouth Once A Day 3)  Ecotrin Low Strength 81 Mg  Tbec (Aspirin) .Marland Kitchen.. 1 By Mouth Qd 4)  Symbicort 160-4.5 Mcg/act  Aero (Budesonide-Formoterol Fumarate) .... 2 Puffs Bid 5)  Gabapentin 300 Mg Caps (Gabapentin) .Marland Kitchen.. 1 By Mouth Three Times A Day 6)  Flexeril 5 Mg Tabs (Cyclobenzaprine Hcl) .Marland Kitchen.. 1po Three Times A Day As Needed 7)  Clarithromycin 500 Mg Tabs (Clarithromycin) .Marland Kitchen.. 1po Two Times A Day 8)  Hydrocodone-Homatropine 5-1.5 Mg/93ml Syrp (Hydrocodone-Homatropine) .Marland Kitchen.. 1 Tsp By Mouth Q 6 Hrs As Needed Cough 9)  Tramadol Hcl 50 Mg Tabs (Tramadol Hcl) .Marland Kitchen.. 1 -2 By Mouth Q 6hrs As Needed Pain 10)  Prednisone 10 Mg Tabs (Prednisone) .... 3po Qd For 3days, Then 2po Qd For 3days, Then 1po Qd For 3days, Then Stop  Current Medications (verified): 1)  Lovastatin 40 Mg  Tabs (Lovastatin) .Marland Kitchen.. 1 By Mouth Once Daily 2)  Exforge Hct 5-160-12.5 Mg Tabs (Amlodipine-Valsartan-Hctz) .Marland Kitchen.. 1po Once Daily 3)  Ecotrin Low Strength 81 Mg  Tbec (Aspirin) .Marland Kitchen.. 1 By Mouth Qd 4)  Symbicort 160-4.5 Mcg/act  Aero (Budesonide-Formoterol Fumarate) .... 2  Puffs Bid 5)  Gabapentin 300 Mg Caps (Gabapentin) .Marland Kitchen.. 1 By Mouth Three Times A Day 6)  Flexeril 5 Mg Tabs (Cyclobenzaprine Hcl) .Marland Kitchen.. 1po Three Times A Day As Needed 7)  Tramadol Hcl 50 Mg Tabs (Tramadol Hcl) .Marland Kitchen.. 1 -2 By Mouth Q 6hrs As Needed Pain 8)  Azithromycin 250 Mg Tabs (Azithromycin) .... 2po Qd For 1 Day, Then 1po Qd For 4days, Then Stop 9)  Gabapentin 300 Mg Caps (Gabapentin) .Marland Kitchen.. 1 By Mouth Two Times A Day  Allergies (verified): 1)  ! Pcn 2)  ! 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   Head:  normocephalic and atraumatic.   Eyes:  vision grossly intact, pupils equal, and pupils round.   Ears:  R ear normal and L ear normal.   Nose:  no external deformity and no nasal discharge.   Mouth:  pharyngeal erythema and fair dentition.   Neck:  supple and no masses.   Lungs:  normal respiratory effort, R decreased breath sounds, and L decreased breath sounds.   Heart:  normal rate and regular rhythm.   Abdomen:  soft, non-tender, and normal bowel sounds.   Msk:  no joint tenderness and no joint swelling.   Extremities:  no edema, no erythema  Neurologic:  cranial nerves II-XII intact, strength normal in all lower extremities, and sensation intact to light touch.     Impression & Recommendations:  Problem # 1:  HYPERTENSION (ICD-401.9)  His updated medication list for this problem includes:    Exforge Hct 5-160-12.5 Mg Tabs (Amlodipine-valsartan-hctz) .Marland Kitchen... 1po once daily to change to exforgehct for better control, gave pharm company card to hopefuly help with cost  Problem # 2:  BRONCHITIS-ACUTE (ICD-466.0)  The following medications were removed from the medication list:    Clarithromycin 500 Mg Tabs (Clarithromycin) .Marland Kitchen... 1po two times a day    Hydrocodone-homatropine 5-1.5 Mg/38ml Syrp (Hydrocodone-homatropine) .Marland Kitchen... 1 tsp by mouth q 6 hrs as needed cough His  updated medication list for this problem includes:    Symbicort 160-4.5 Mcg/act Aero (Budesonide-formoterol fumarate) .Marland Kitchen... 2 puffs bid    Azithromycin 250 Mg Tabs (Azithromycin) .Marland Kitchen... 2po qd for 1 day, then 1po qd for 4days, then stop treat as above, f/u any worsening signs or symptoms   Problem # 3:  SCIATICA, LEFT (ICD-724.3)  His updated medication list for this problem includes:    Ecotrin Low Strength 81 Mg Tbec (Aspirin) .Marland Kitchen... 1 by mouth qd    Flexeril 5 Mg Tabs (Cyclobenzaprine hcl) .Marland Kitchen... 1po three times a day as needed    Tramadol Hcl 50 Mg Tabs (Tramadol hcl) .Marland Kitchen... 1 -2 by mouth q 6hrs as needed pain stable overall by hx and exam, ok to continue meds/tx as is, to add the gabapentin for beter control  Complete Medication List: 1)  Lovastatin 40 Mg Tabs (Lovastatin) .Marland Kitchen.. 1 by  mouth once daily 2)  Exforge Hct 5-160-12.5 Mg Tabs (Amlodipine-valsartan-hctz) .Marland Kitchen.. 1po once daily 3)  Ecotrin Low Strength 81 Mg Tbec (Aspirin) .Marland Kitchen.. 1 by mouth qd 4)  Symbicort 160-4.5 Mcg/act Aero (Budesonide-formoterol fumarate) .... 2 puffs bid 5)  Gabapentin 300 Mg Caps (Gabapentin) .Marland Kitchen.. 1 by mouth three times a day 6)  Flexeril 5 Mg Tabs (Cyclobenzaprine hcl) .Marland Kitchen.. 1po three times a day as needed 7)  Tramadol Hcl 50 Mg Tabs (Tramadol hcl) .Marland Kitchen.. 1 -2 by mouth q 6hrs as needed pain 8)  Azithromycin 250 Mg Tabs (Azithromycin) .... 2po qd for 1 day, then 1po qd for 4days, then stop 9)  Gabapentin 300 Mg Caps (Gabapentin) .Marland Kitchen.. 1 by mouth two times a day  Patient Instructions: 1)  start the exforge HCT - 1 per day 2)  start the gabapentin 300 mg pills at one per night for 3 night, then twice per day after that for the left leg pain 3)  take the azithromycin antibiotic for the bronchitis 4)  Continue all other previous medications as before this visit 5)  Please schedule a follow-up appointment in 1 year or sooner if needed 6)  please apply for medicare as you are over 65yo, if you do not have current  insurance, at the office on [address] Prescriptions: GABAPENTIN 300 MG CAPS (GABAPENTIN) 1 by mouth two times a day  #60 x 5   Entered and Authorized by:   Corwin Levins MD   Signed by:   Corwin Levins MD on 11/08/2009   Method used:   Print then Give to Patient   RxID:   [id number] AZITHROMYCIN 250 MG TABS (AZITHROMYCIN) 2po qd for 1 day, then 1po qd for 4days, then stop  #6 x 1   Entered and Authorized by:   Corwin Levins MD   Signed by:   Corwin Levins MD on 11/08/2009   Method used:   Print then Give to Patient   RxID:   [id number] EXFORGE HCT 5-160-12.5 MG TABS (AMLODIPINE-VALSARTAN-HCTZ) 1po once daily  #30 x 11   Entered and Authorized by:   Corwin Levins MD   Signed by:   Corwin Levins MD on 11/08/2009   Method used:   Print then Give to Patient   RxID:   [id number]

## 2010-09-24 NOTE — Assessment & Plan Note (Signed)
Summary: rov 6 months///kp   CC:  Follow up visit.  History of Present Illness: 02/23/09- Asthma/ COPD, hx byssinosis, allergic rhinitis/ rhinosinusitis No more hemoptysis since last here. Much nasal congestion and sinus drainage. Clear mucus from nose,  but thick white from chest. Weather makes a difference. Doxycycline helped but made stomach hurt- rel by PeptoBismol. GI now ok. Feels weak and washed out. Denies sinus headache, chest pain, blood or ourulent, fever or chill.  04/24/09- Asthma/ COPD, hx Byssinosis, allergic thinitis, rhinitsinusitis Coughed streaks of  blood in mucus again for a few days last week. Self limited. Has been taking aspirin off and on- discussed. Not coughing more than usual. Denies chest pain or fever, glands or other  bleeding. Denies exertional chest pain, palpitation, heartburn nausea, reflux, melena. CXR 01/23/09- questioned pneumonia left lung.  June 05, 2009- Asthma/ COPD, hx Byssinosis, allergic rhinitis, rhiniosinusitis Compalins of lack of energy for several weeks. Bleeding stopped when he quit Anacins. Denies glands, fever, pain, weight loss.  Breathing ok but nose runs. CXR- COPD but no acute change since 2008  December 03, 2009- Asthma/ COPD, hx Byssinosis, allergic rhinitis, rhinosinusitis ........daughter here He has been seeing Dr Jonny Ruiz quite a bit and took an antibiotic for bronchits. He has now retired. He says he is breathing ok today. He wears a mask to mow. Has not coughed blood in a very long time, especially as he has backed off on aspirin. He still uses Symbicort twice daily.      Current Medications (verified): 1)  Lovastatin 40 Mg  Tabs (Lovastatin) .Marland Kitchen.. 1 By Mouth Once Daily 2)  Exforge Hct 5-160-12.5 Mg Tabs (Amlodipine-Valsartan-Hctz) .Marland Kitchen.. 1po Once Daily 3)  Ecotrin Low Strength 81 Mg  Tbec (Aspirin) .Marland Kitchen.. 1 By Mouth Qd 4)  Symbicort 160-4.5 Mcg/act  Aero (Budesonide-Formoterol Fumarate) .... 2 Puffs Bid 5)  Tramadol Hcl 50 Mg Tabs  (Tramadol Hcl) .Marland Kitchen.. 1 -2 By Mouth Q 6hrs As Needed Pain 6)  Citalopram Hydrobromide 10 Mg Tabs (Citalopram Hydrobromide) .Marland Kitchen.. 1 By Mouth Once Daily  Allergies (verified): 1)  ! Pcn 2)  ! Ace Inhibitors 3)  Lipitor 4)  Simvastatin  Past History:  Past Medical History: Last updated: 11/30/2009 Hyperlipidemia Hypertension Asthma COPD/chronic bronchitic component byssinosis Hemoptysis- intermittent, chronic Allergic rhinitis Colonic polyps, hx of - adenomatous recurrent boil Peripheral vascular disease - severe - refuses surgery Coronary artery disease Benign prostatic hypertrophy lumbar disc disease Depression  Past Surgical History: Last updated: 10/27/2007 Denies surgical history  Family History: Last updated: 10/27/2007 HTN - 3 sisters stroke lung cancer breast cancer alcoholism brother died with anuerysm  Social History: Last updated: 10/27/2007 Never Smoked Alcohol use-no 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    Vital Signs:  Patient profile:   77 year old male Height:      68 inches Weight:      174.25 pounds BMI:     26.59 O2 Sat:      94 % on Room air Pulse rate:   110 / minute BP sitting:   136 / 82  (left arm) Cuff size:   regular  Vitals Entered By: Reynaldo Minium CMA (December 03, 2009 3:46 PM)  O2 Flow:  Room air  Physical Exam  Additional Exam:  General: A/Ox3; pleasant and cooperative, NAD,  seems comfortable SKIN: no rash, lesions NODES: no lymphadenopathy HEENT: [place]/AT, EOM- WNL, Conjuctivae- clear, PERRLA, TM-WNL, Nose- clear, Throat- clear and wnl, Mallampati  II-III NECK: Supple w/ fair ROM, JVD-  none, normal carotid impulses w/o bruits Thyroid-  CHEST: clear withminimal end-expiratory wheeze , unlabored without dullness HEART: RRR, no m/g/r heard ABDOMEN: Soft and nl;  EAV:WUJW, nl pulses, no edema  NEURO: Grossly intact to observation      Impression & Recommendations:  Problem # 1:  COPD (ICD-496) Currently well controlled .He avoids triggers. He resists addition of a rescue inhaler, saying he doesn't want more medicines. We discussed this and will add one if needed.  Problem # 2:  ALLERGIC RHINITIS (ICD-477.9) Not significant problem so far this spring.  Medications Added to Medication List This Visit: 1)  Proair Hfa 108 (90 Base) Mcg/act Aers (Albuterol sulfate) .... 2 puffs four times a day as needed rescue inhaler  Other Orders: Est. Patient Level II (11914)  Patient Instructions: 1)  Please schedule a follow-up appointment in 6 months. 2)  Please call if needed sooner. Prescriptions: PROAIR HFA 108 (90 BASE) MCG/ACT AERS (ALBUTEROL SULFATE) 2 puffs four times a day as needed rescue inhaler  #1 x prn   Entered and Authorized by:   Waymon Budge MD   Signed by:   Waymon Budge MD on 12/03/2009   Method used:   Historical   RxID:   [id number]

## 2010-09-26 NOTE — Assessment & Plan Note (Signed)
Summary: hands,feet,and legs burning-lb   Vital Signs:  Patient profile:   77 year old male Height:      68 inches Weight:      160.38 pounds BMI:     24.47 O2 Sat:      97 % on Room air Temp:     98.6 degrees F oral Pulse rate:   92 / minute BP sitting:   134 / 82  (left arm) Cuff size:   regular  Vitals Entered By: Zella Ball Ewing CMA (AAMA) (September 02, 2010 8:06 AM)  O2 Flow:  Room air CC: Feet, hands and lower legs burning, cough/RE   CC:  Feet, hands and lower legs burning, and cough/RE.  History of Present Illness: here to f/u;  c/o 3 days onset fever, facial pain, pressure, greenish d/c, with midl ST and nonprod cough and Pt denies CP, worsening sob, doe, wheezing, orthopnea, pnd, worsening LE edema, palps, dizziness or syncope  Pt denies new neuro symptoms such as headache, facial or extremity weakness, but did have nubmness and tingling to the hands at feet at the onset of symtpoms but only lasted < 24 hrs, and none since.   Pt denies polydipsia, polyuria, Overall good compliance with meds, trying to follow low chol diet, wt stable, little excercise however . Has had allergy symtpoms nasal with clearish congestion, no pain, fever  but with itch and sneeze for several months prior, better with allergy meds.    Preventive Screening-Counseling & Management      Drug Use:  no.    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) 16)  Leg  Pain, Bilateral  (ICD-729.5) 17)  Preventive Health Care  (ICD-V70.0) 18)  Foot Pain  (ICD-729.5) 19)  Myalgia  (ICD-729.1) 20)  Chronic Obstructive Pulmonary Disease, Acute Exacerbation  (ICD-491.21) 21)  Benign Prostatic Hypertrophy  (ICD-600.00) 22)  Coronary Artery Disease  (ICD-414.00) 23)  Peripheral Vascular Disease  (ICD-443.9) 24)  Colonic Polyps, Hx of  (ICD-V12.72) 25)  Allergic Rhinitis  (ICD-477.9) 26)  COPD  (ICD-496) 27)  Asthma  (ICD-493.90) 28)  Rash-nonvesicular  (ICD-782.1) 29)  Hypertension  (ICD-401.9) 30)  Hyperlipidemia  (ICD-272.4)  Medications Prior to Update: 1)  Lovastatin 40 Mg  Tabs (Lovastatin) .Marland Kitchen.. 1 By Mouth Once Daily 2)  Exforge Hct 5-160-12.5 Mg Tabs (Amlodipine-Valsartan-Hctz) .Marland Kitchen.. 1po Once Daily 3)  Ecotrin Low Strength 81 Mg  Tbec (Aspirin) .Marland Kitchen.. 1 By Mouth Qd 4)  Symbicort 160-4.5 Mcg/act  Aero (Budesonide-Formoterol Fumarate) .... 2 Puffs Bid 5)  Proair Hfa 108 (90 Base) Mcg/act Aers (Albuterol Sulfate) .... 2 Puffs Four Times A Day As Needed Rescue Inhaler 6)  Fluticasone Propionate 50 Mcg/act Susp (Fluticasone Propionate) .... 2 Spray/side Once Daily 7)  Fexofenadine Hcl 180 Mg Tabs (Fexofenadine Hcl) .Marland Kitchen.. 1po Once Daily For Allergies 8)  Vitamin D3 2000 Unit Caps (Cholecalciferol) .... Take One By Mouth Once Daily  Current Medications (verified): 1)  Lovastatin 40 Mg  Tabs (Lovastatin) .Marland Kitchen.. 1 By Mouth Once Daily 2)  Exforge Hct 5-160-12.5 Mg Tabs (Amlodipine-Valsartan-Hctz) .Marland Kitchen.. 1po Once Daily 3)  Ecotrin Low Strength 81 Mg  Tbec (Aspirin) .Marland Kitchen.. 1 By Mouth Qd 4)  Symbicort 160-4.5 Mcg/act  Aero (Budesonide-Formoterol Fumarate) .... 2 Puffs Bid 5)  Proair Hfa 108 (90 Base) Mcg/act Aers (Albuterol Sulfate) .... 2 Puffs Four Times A Day As Needed Rescue Inhaler 6)  Fluticasone Propionate 50 Mcg/act Susp (Fluticasone Propionate) .... 2 Spray/side Once Daily 7)  Fexofenadine Hcl 180 Mg Tabs (Fexofenadine Hcl) .Marland Kitchen.. 1po Once Daily For Allergies 8)   Vitamin D3 2000 Unit Caps (Cholecalciferol) .... Take One By Mouth Once Daily 9)  Azithromycin 250 Mg Tabs (Azithromycin) .... 2po Qd For 1 Day, Then 1po Qd For 4days, Then Stop 10)  Tussionex Pennkinetic Er 10-8 Mg/86ml Lqcr (Hydrocod Polst-Chlorphen Polst) .Marland Kitchen.. 1 Tsp By Mouth Two Times A Day As Needed  Allergies (verified): 1)  ! Pcn 2)  ! 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  , mild ill  Head:  normocephalic and atraumatic.   Eyes:  vision grossly intact, pupils equal, and pupils round.   Ears:  bilat tm's midl red, sinus tender bilat Nose:  nasal dischargemucosal pallor and mucosal edema.   Mouth:  pharyngeal erythema and fair dentition.   Neck:  supple and no masses.   Lungs:  normal respiratory effort, R decreased breath sounds, and L decreased breath sounds.   Heart:  normal rate and regular rhythm.   Msk:  no joint tenderness and no joint swelling.   Extremities:  no edema, no erythema  Neurologic:  sensation intact to light touch and gait normal.     Impression & Recommendations:  Problem # 1:   SINUSITIS- ACUTE-NOS (ICD-461.9)  His updated medication list for this problem includes:    Fluticasone Propionate 50 Mcg/act Susp (Fluticasone propionate) .Marland Kitchen... 2 spray/side once daily    Azithromycin 250 Mg Tabs (Azithromycin) .Marland Kitchen... 2po qd for 1 day, then 1po qd for 4days, then stop    Tussionex Pennkinetic Er 10-8 Mg/98ml Lqcr (Hydrocod polst-chlorphen polst) .Marland Kitchen... 1 tsp by mouth two times a day as needed treat as above, f/u any worsening signs or symptoms   Problem # 2:  COPD (ICD-496)  His updated medication list for this problem includes:    Symbicort 160-4.5 Mcg/act Aero (Budesonide-formoterol fumarate) .Marland Kitchen... 2 puffs bid    Proair Hfa 108 (90 Base) Mcg/act Aers (Albuterol sulfate) .Marland Kitchen... 2 puffs four times a day as needed rescue inhaler stable overall by hx and exam, ok to continue meds/tx as is   Problem # 3:  PARESTHESIA (ICD-782.0) resolved, most recent labs reviewed with pt  Problem # 4:  ALLERGIC RHINITIS (ICD-477.9)  His updated medication  list for this problem includes:    Fluticasone Propionate 50 Mcg/act Susp (Fluticasone propionate) .Marland Kitchen... 2 spray/side once daily    Fexofenadine Hcl 180 Mg Tabs (Fexofenadine hcl) .Marland Kitchen... 1po once daily for allergies treat as above, f/u any worsening signs or symptoms  - -re-start meds  Complete Medication List: 1)  Lovastatin 40 Mg Tabs (Lovastatin) .Marland Kitchen.. 1 by mouth once daily 2)  Exforge Hct 5-160-12.5 Mg Tabs (Amlodipine-valsartan-hctz) .Marland Kitchen.. 1po once daily 3)  Ecotrin Low Strength 81 Mg Tbec (Aspirin) .Marland Kitchen.. 1 by mouth qd 4)  Symbicort 160-4.5 Mcg/act Aero (Budesonide-formoterol fumarate) .... 2 puffs bid 5)  Proair Hfa 108 (90 Base) Mcg/act Aers (Albuterol sulfate) .... 2 puffs four times a day as needed rescue inhaler 6)  Fluticasone Propionate 50 Mcg/act Susp (Fluticasone propionate) .... 2 spray/side once daily 7)  Fexofenadine Hcl 180 Mg Tabs (Fexofenadine hcl) .Marland Kitchen.. 1po once daily for allergies 8)  Vitamin D3 2000 Unit Caps  (Cholecalciferol) .... Take one by mouth once daily 9)  Azithromycin 250 Mg Tabs (Azithromycin) .... 2po qd for 1 day, then 1po qd for 4days, then stop 10)  Tussionex Pennkinetic Er 10-8 Mg/56ml Lqcr (Hydrocod polst-chlorphen polst) .Marland Kitchen.. 1 tsp by mouth two times a day as needed  Patient Instructions: 1)  Please take all new medications as prescribed 2)  Continue all previous medications as before this visit  3)  Please schedule a follow-up appointment in 3 months. Prescriptions: Sandria Senter ER 10-8 MG/5ML LQCR (HYDROCOD POLST-CHLORPHEN POLST) 1 tsp by mouth two times a day as needed  #6oz x 1   Entered and Authorized by:   Corwin Levins MD   Signed by:   Corwin Levins MD on 09/02/2010   Method used:   Print then Give to Patient   RxID:   [id number] AZITHROMYCIN 250 MG TABS (AZITHROMYCIN) 2po qd for 1 day, then 1po qd for 4days, then stop  #6 x 1   Entered and Authorized by:   Corwin Levins MD   Signed by:   Corwin Levins MD on 09/02/2010   Method used:   Print then Give to Patient   RxID:   [id number]    Orders Added: 1)  Est. Patient Level IV [29528]

## 2010-11-18 ENCOUNTER — Ambulatory Visit (INDEPENDENT_AMBULATORY_CARE_PROVIDER_SITE_OTHER)
Admission: RE | Admit: 2010-11-18 | Discharge: 2010-11-18 | Disposition: A | Discharge status: Home / Self Care | Payer: Medicare PPO | Source: Ambulatory Visit | Attending: Internal Medicine | Admitting: Internal Medicine

## 2010-11-18 ENCOUNTER — Encounter: Payer: Self-pay | Admitting: Internal Medicine

## 2010-11-18 ENCOUNTER — Ambulatory Visit (INDEPENDENT_AMBULATORY_CARE_PROVIDER_SITE_OTHER): Payer: Self-pay | Admitting: Internal Medicine

## 2010-11-18 ENCOUNTER — Other Ambulatory Visit (INDEPENDENT_AMBULATORY_CARE_PROVIDER_SITE_OTHER): Payer: Medicare PPO

## 2010-11-18 VITALS — BP 118/72 | HR 79 | Ht 68.0 in | Wt 161.0 lb

## 2010-11-18 DIAGNOSIS — F329 Major depressive disorder, single episode, unspecified: Secondary | ICD-10-CM

## 2010-11-18 DIAGNOSIS — J309 Allergic rhinitis, unspecified: Secondary | ICD-10-CM

## 2010-11-18 DIAGNOSIS — J449 Chronic obstructive pulmonary disease, unspecified: Secondary | ICD-10-CM

## 2010-11-18 LAB — CBC WITH DIFFERENTIAL/PLATELET
Basophils Absolute: 0 10*3/uL (ref 0.0–0.1)
Eosinophils Absolute: 0.1 10*3/uL (ref 0.0–0.7)
Hemoglobin: 14 g/dL (ref 13.0–17.0)
Lymphocytes Relative: 18 % (ref 12.0–46.0)
Lymphs Abs: 1 10*3/uL (ref 0.7–4.0)
MCHC: 33.9 g/dL (ref 30.0–36.0)
Neutro Abs: 3.9 10*3/uL (ref 1.4–7.7)
RDW: 15.6 % — ABNORMAL HIGH (ref 11.5–14.6)

## 2010-11-18 LAB — BASIC METABOLIC PANEL
CO2: 29 mEq/L (ref 19–32)
Chloride: 100 mEq/L (ref 96–112)
Glucose, Bld: 70 mg/dL (ref 70–99)
Sodium: 136 mEq/L (ref 135–145)

## 2010-11-18 LAB — HEPATIC FUNCTION PANEL
Albumin: 3.7 g/dL (ref 3.5–5.2)
Total Bilirubin: 0.3 mg/dL (ref 0.3–1.2)

## 2010-11-18 NOTE — Progress Notes (Signed)
  Subjective:    Patient ID: Matthew Gray, male    DOB: 1933/11/20, 77 y.o.   MRN: [id number]  HPI 77 yo M followed here for hx asthma which was severe in past but much better in recent years, never smoked. 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 Last here June 05, 2010. He came in today, actually thinking he was going to Dr Jonny Ruiz about poor appetite. Weighed 166 in our office in October, 2011,> 161 now. Lives alone. Admits feeling depressed.  Denies pain, blood, dark stools, nausea, cough or wheeze. Denies fevers or sweats.  I reviewed Epic and [facility] EMRs, w/o finding new lab work.    Review of Systems See HPI Constitutional:   Weight loss, poor appetite, "no energy",  No-night sweats,  Fevers, chills, fatigue, HEENT:   No headaches,  Difficulty swallowing,  Tooth/dental problems,  Sore throat,                No sneezing, itching, ear ache, nasal congestion, post nasal drip,   CV:  No chest pain,  Orthopnea, PND, swelling in lower extremities, anasarca, dizziness, palpitations  GI  No heartburn, indigestion, abdominal pain, nausea, vomiting, diarrhea, change in bowel habits,   Resp: .   Skin: no rash or lesions.  GU: no dysuria, change in color of urine, no urgency or frequency.  No flank pain.  MS:  No joint pain or swelling.  No decreased range of motion.  No back pain.  Psych:  No change in mood or affect.Subdued ? depressed.  No memory loss.     Objective:   Physical Exam    General- Alert, Oriented, Affect-appropriate, Distress- none acute. Subdued, maybe depressed  Skin- rash-none, lesions- none, excoriation- none  Lymphadenopathy- none  Head- atraumatic  Eyes- Gross vision intact, PERRLA, conjunctivae clear, secretions  Ears- normal Hearing, canals, Tm L ,   R ,  Nose- Clear, Septal dev, mucus, polyps, erosion, perforation   Throat- Mallampati II , mucosa clear , drainage- none, tonsils- atrophic  Neck- flexible , trachea midline, no stridor ,  thyroid nl, carotid no bruit  Chest - symmetrical excursion , unlabored     Heart/CV- RRR , no murmur , no gallop  , no rub, nl s1 s2                     - JVD- none , edema- none, stasis changes- none, varices- none     Lung- poor airflow with faint rhonchi in bases , dullness-none, rub- none     Chest wall- atraumatic, no scar  Abd- tender-no, distended-no, bowel sounds-present, HSM- no  Br/ Gen/ Rectal- Not done, not indicated  Extrem- cyanosis- none, clubbing, none, atrophy- none, strength- nl  Neuro- grossly intact to observation      Assessment & Plan:

## 2010-11-18 NOTE — Patient Instructions (Addendum)
Order- CXR PA& lat, CBC w/ diff, Hepatic profile, BMET for dx COPD  Schedule PFT  Ask pharmacy for eldertonic or geritol to help your appetite  Make a follow-up visit with Dr Jonny Ruiz.

## 2010-11-18 NOTE — Assessment & Plan Note (Addendum)
He complains of poor appetite and admits some depression, which may be the issue. He has lost a few lbs, living alone.  There is possibility of cachexia of chronic lung disease. We can update CXR and PFT with some basic lab. He will need to make routine f/u for primary care with Dr Jonny Ruiz. I won't do any harm by suggesting Eldertonic pending labs.

## 2010-11-20 ENCOUNTER — Encounter: Payer: Self-pay | Admitting: Internal Medicine

## 2010-11-20 NOTE — Assessment & Plan Note (Signed)
He is not describing significant conjunctival or nasal mucosal irritation now. It has been a bigger problem in past years.

## 2010-11-20 NOTE — Assessment & Plan Note (Signed)
This may be the primary problem for this man living alone. I directed him to f/u with Dr Jonny Ruiz.

## 2010-11-22 ENCOUNTER — Encounter: Payer: Self-pay | Admitting: Internal Medicine

## 2010-11-22 ENCOUNTER — Ambulatory Visit (INDEPENDENT_AMBULATORY_CARE_PROVIDER_SITE_OTHER): Payer: Medicare PPO | Admitting: Internal Medicine

## 2010-11-22 ENCOUNTER — Other Ambulatory Visit (INDEPENDENT_AMBULATORY_CARE_PROVIDER_SITE_OTHER): Payer: Medicare PPO

## 2010-11-22 VITALS — BP 110/70 | HR 96 | Temp 98.5°F | Ht 69.0 in | Wt 158.5 lb

## 2010-11-22 DIAGNOSIS — F411 Generalized anxiety disorder: Secondary | ICD-10-CM

## 2010-11-22 DIAGNOSIS — Z0000 Encounter for general adult medical examination without abnormal findings: Secondary | ICD-10-CM

## 2010-11-22 DIAGNOSIS — R634 Abnormal weight loss: Secondary | ICD-10-CM

## 2010-11-22 DIAGNOSIS — Z79899 Other long term (current) drug therapy: Secondary | ICD-10-CM

## 2010-11-22 DIAGNOSIS — F419 Anxiety disorder, unspecified: Secondary | ICD-10-CM

## 2010-11-22 DIAGNOSIS — Z0001 Encounter for general adult medical examination with abnormal findings: Secondary | ICD-10-CM | POA: Insufficient documentation

## 2010-11-22 DIAGNOSIS — Z125 Encounter for screening for malignant neoplasm of prostate: Secondary | ICD-10-CM

## 2010-11-22 LAB — LIPID PANEL
Cholesterol: 153 mg/dL (ref 0–200)
LDL Cholesterol: 75 mg/dL (ref 0–99)
Total CHOL/HDL Ratio: 2

## 2010-11-22 MED ORDER — TETANUS-DIPHTH-ACELL PERTUSSIS 5-2.5-18.5 LF-MCG/0.5 IM SUSP
0.5000 mL | Freq: Once | INTRAMUSCULAR | Status: AC
  Administered 2010-11-22: 0.5 mL via INTRAMUSCULAR

## 2010-11-22 MED ORDER — PNEUMOCOCCAL VAC POLYVALENT 25 MCG/0.5ML IJ INJ
0.5000 mL | INJECTION | Freq: Once | INTRAMUSCULAR | Status: AC
  Administered 2010-11-22: 0.5 mL via INTRAMUSCULAR

## 2010-11-22 NOTE — Assessment & Plan Note (Signed)

## 2010-11-22 NOTE — Progress Notes (Signed)
Quick Note:  Pt aware of results and no questions or concerns at this time. ______ 

## 2010-11-22 NOTE — Assessment & Plan Note (Addendum)
I suspect with mild depression as well, and I suggested citalopram 10 mg, but family with him spoke negatively about taking "anti-depressants" and he declines, may re-consider at later time; confirmed with pt no suicidal ideation today

## 2010-11-22 NOTE — Assessment & Plan Note (Signed)
Minor, ok to follow for now, recent mar 26 cxr and labs reviewed with pt

## 2010-11-22 NOTE — Progress Notes (Signed)
Subjective:    Patient ID: Matthew Gray, male    DOB: Sep 21, 1933, 77 y.o.   MRN: [id number]  HPI  Here for wellness and f/u;  Overall doing ok;  Pt denies CP, worsening SOB, DOE, wheezing, orthopnea, PND, worsening LE edema, palpitations, dizziness or syncope.  Pt denies neurological change such as new Headache, facial or extremity weakness.  Pt denies polydipsia, polyuria, or low sugar symptoms. Pt states overall good compliance with treatment and medications, good tolerability, and trying to follow lower cholesterol diet.  Pt denies worsening depressive symptoms, suicidal ideation or panic, though does have some ongoing anxiety, has been increased recently. No fever, night sweats, loss of appetite, or other constitutional symptoms.  Pt states good ability with ADL's, low fall risk, home safety reviewed and adequate, no significant changes in hearing or vision, and occasionally active with exercise.  Does  c/o low appetitie - just doesn't eat like he used to; no dysphagia, abd pain, bowel changes, fever, night sweat but has lost some wt  He thinks - by chart records only down 1-2 lbs from jan 2012.  No n/v.  Denies worsening depressive symptoms, suicidal ideation, or panic, though has ongoing anxiety, increased recently though not sure why, now worried sometimes with not much to worry about. No ongoing pain, orhtopedic or other.  No significant use lately.   Past Medical History  Diagnosis Date  . 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 VITAMIN D DEFICIENCY 06/04/2010  . HYPERLIPIDEMIA 04/03/2007  . DEPRESSION 11/30/2009  . HYPERTENSION 04/03/2007  . CORONARY ARTERY DISEASE 10/27/2007  . PERIPHERAL VASCULAR DISEASE 10/27/2007  . SINUSITIS- ACUTE-NOS 10/08/2009  . ALLERGIC RHINITIS 10/27/2007  . CHRONIC OBSTRUCTIVE PULMONARY DISEASE, ACUTE EXACERBATION 11/22/2007  . ASTHMA 10/27/2007  . COPD 10/27/2007  . BENIGN PROSTATIC HYPERTROPHY 10/27/2007  . SHOULDER PAIN, LEFT 06/21/2009  . ANKLE PAIN, RIGHT 11/30/2009  .  SCIATICA, LEFT 11/08/2009  . BACK PAIN 10/08/2009  . MYALGIA 02/04/2008  . Pain in Soft Tissues of Limb 02/04/2008  . FATIGUE 11/30/2009  . RASH-NONVESICULAR 10/27/2007  . PERIPHERAL EDEMA 11/16/2009  . Hemoptysis 04/24/2009  . COLONIC POLYPS, HX OF 10/27/2007  . PARESTHESIA 09/02/2010   No past surgical history on file.  reports that he has never smoked. He does not have any smokeless tobacco history on file. He reports that he does not drink alcohol or use illicit drugs. family history includes Alcohol abuse in his other; Anuerysm in his brother; Cancer in his other; Hypertension in his sister; and Stroke in his other. Allergies  Allergen Reactions  . Ace Inhibitors   . Atorvastatin     REACTION: myalgias  . Penicillins   . Simvastatin     REACTION: myalgias   Current Outpatient Prescriptions on File Prior to Visit  Medication Sig Dispense Refill  . Amlodipine-Valsartan-HCTZ (EXFORGE HCT) 5-160-12.5 MG TABS Take by mouth daily.        Marland Kitchen aspirin (ECOTRIN LOW STRENGTH) 81 MG EC tablet Take 81 mg by mouth daily.        . Cholecalciferol (VITAMIN D3) 2000 UNITS capsule Take 2,000 Units by mouth daily.        . fexofenadine (ALLEGRA) 180 MG tablet Take 180 mg by mouth daily. For allergies       . lovastatin (MEVACOR) 40 MG tablet Take 40 mg by mouth daily.        Marland Kitchen albuterol (PROAIR HFA) 108 (90 BASE) MCG/ACT inhaler Inhale 2 puffs into the lungs. 2 puffs four times a day  as needed rescue inhaler       . budesonide-formoterol (SYMBICORT) 160-4.5 MCG/ACT inhaler Inhale 2 puffs into the lungs 2 (two) times daily.        . fluticasone (FLONASE) 50 MCG/ACT nasal spray 2 sprays by Nasal route daily.          Review of Systems Review of Systems  Constitutional: Negative for diaphoresis, activity change, appetite change and unexpected weight change.  HENT: Negative for hearing loss, ear pain, facial swelling, mouth sores and neck stiffness.   Eyes: Negative for pain, redness and visual disturbance.    Respiratory: Negative for shortness of breath and wheezing.   Cardiovascular: Negative for chest pain and palpitations.  Gastrointestinal: Negative for diarrhea, blood in stool, abdominal distention and rectal pain.  Genitourinary: Negative for hematuria, flank pain and decreased urine volume.  Musculoskeletal: Negative for myalgias and joint swelling.  Skin: Negative for color change and wound.  Neurological: Negative for syncope and numbness.  Hematological: Negative for adenopathy.  Psychiatric/Behavioral: Negative for hallucinations, self-injury, decreased concentration and agitation.      Objective:   Physical Exam BP 110/70  Pulse 96  Temp(Src) 98.5 F (36.9 C) (Oral)  Ht 5\' 9"  (1.753 m)  Wt 158 lb 8 oz (71.895 kg)  BMI 23.41 kg/m2  SpO2 97%  Physical Exam  VS noted Constitutional: Pt is oriented to person, place, and time. Appears well-developed and well-nourished.  HENT:  Head: Normocephalic and atraumatic.  Right Ear: External ear normal.  Left Ear: External ear normal.  Nose: Nose normal.  Mouth/Throat: Oropharynx is clear and moist.  Eyes: Conjunctivae and EOM are normal. Pupils are equal, round, and reactive to light.  Neck: Normal range of motion. Neck supple. No JVD present. No tracheal deviation present.  Cardiovascular: Normal rate, regular rhythm, normal heart sounds and intact distal pulses.   Pulmonary/Chest: Effort normal and breath sounds normal.  Abdominal: Soft. Bowel sounds are normal. There is no tenderness.  Musculoskeletal: Normal range of motion. Exhibits no edema.  Lymphadenopathy:  Has no cervical adenopathy.  Neurological: Pt is alert and oriented to person, place, and time. Pt has normal reflexes. No cranial nerve deficit.  Skin: Skin is warm and dry. No rash noted.  Psychiatric:  Has dysphroric mood and affect. Behavior is normal.          Assessment & Plan:

## 2010-11-22 NOTE — Patient Instructions (Addendum)
You had the tetanus and pneumonia shots today Please call if you change your mind about taking the citalopram 10 mg per day (for tension and lower mood) Please go to LAB in the Basement for the blood and/or urine tests to be done today Please call the number on the Blue Card (the [employer] System) for results of testing in 2-3 days Please return in 1 year for your yearly visit, or sooner if needed, with Lab testing done 3-5 days before

## 2010-11-24 ENCOUNTER — Encounter: Payer: Self-pay | Admitting: Internal Medicine

## 2010-11-25 LAB — URINALYSIS, ROUTINE W REFLEX MICROSCOPIC
Bilirubin Urine: NEGATIVE
Nitrite: NEGATIVE
Total Protein, Urine: NEGATIVE
pH: 6 (ref 5.0–8.0)

## 2010-11-25 NOTE — Progress Notes (Signed)
Quick Note:  Voice message left on [employer] system - lab is negative, normal or otherwise stable, pt to continue same tx ______ 

## 2010-12-03 ENCOUNTER — Encounter: Payer: Self-pay | Admitting: Internal Medicine

## 2010-12-04 ENCOUNTER — Ambulatory Visit: Payer: Self-pay | Admitting: Internal Medicine

## 2010-12-20 ENCOUNTER — Other Ambulatory Visit: Payer: Self-pay | Admitting: Internal Medicine

## 2010-12-23 ENCOUNTER — Other Ambulatory Visit: Payer: Self-pay | Admitting: Internal Medicine

## 2011-01-07 NOTE — Assessment & Plan Note (Signed)
[facility]                             PULMONARY OFFICE NOTE   NAME:Matthew Gray, Matthew Gray                          MRN:          [id number]  DATE:05/18/2007                            DOB:          July 31, 1934    PRIMARY CARE PHYSICIAN:  Corwin Levins, M.D.   PROBLEM:  1. Asthma/COPD/byssinosis.  2. Allergic rhinitis.  3. Rhinosinusitis.   HISTORY:  [name] had been admitted to [facility] on August 29,  after presenting here with rapid onset swelling of the right lower face.  A CT scan showed pansinusitis with acute on chronic bilateral maxillary  disease.  At admission, we were concerned that he was developing also a  cellulitis.  He was treated with broad-spectrum antibiotics in hospital  and steadily improved.  He was seen in consultation by Dr. Suzanna Obey.  Subsequently, as swelling went down, he was seen to have a furuncle on  his right cheek.  All of that has now completely resolved.  He says he  feels well, back to baseline, still with some chronic cough and  bronchitis, which is not new for him.  He likes Symbicort.  He denies  fever, chills or any discolored discharge.   MEDICATION:  1. Chlorthalidone 25 mg.  2. Crestor 20 mg.  3. EXFORGE 5/160.  4. Aspirin 81 mg.  5. Symbicort 160/4.5.  6. Albuterol rescue inhaler.   DRUG INTOLERANT:  PENICILLIN, LIPITOR and ACE INHIBITORS, which cause  cough.   OBJECTIVE:  Weight 193 pounds, BP 108/80, pulse 78, room air saturation  96%.  Facial swelling has completely gone down.  He has a mild, loose  cough, diminished breath sounds, delayed expiratory phase.  No  adenopathy.  Heart sounds are regular without murmur or gallop.  There  is no cyanosis or edema.   IMPRESSION:  Asthma/COPD with a chronic bronchitic component.  Rhinosinusitis and soft-tissue infection of the face clinically have  resolved.   PLAN:  He will continue Symbicort.  Medication talk done.  Schedule  return six months,  earlier p.r.n.     Clinton D. Maple Hudson, MD, Tonny Bollman, FACP  Electronically Signed    CDY/MedQ  DD: 05/18/2007  DT: 05/19/2007  Job #: [id number]

## 2011-01-07 NOTE — H&P (Signed)
NAMELIPA, KNAUFF NO.:  [id number]   MEDICAL RECORD NO.:  [id number]          PATIENT TYPE:  INP   LOCATION:  6702                         FACILITY:  [facility]   PHYSICIAN:  Clinton D. Maple Hudson, MD, FCCP, FACPDATE OF BIRTH:  1934-02-15   DATE OF ADMISSION:  04/23/2007  DATE OF DISCHARGE:                              HISTORY & PHYSICAL   CHIEF COMPLAINT:  Facial swelling and pain times one day.   HISTORY OF PRESENT ILLNESS:  This is a 77 year old African American male  patient of Dr. Roxy Cedar who has a history of chronic bronchitis and  byssinosis.  The patient presents to the office today for an acute visit  complaining of soreness and swelling along the right side of his face.  Symptoms began yesterday evening when he noticed he has a slight twinge  along the right side of his nose.  He looked in the mirror and noticed  significant swelling from his eye all along the right side of face and  to his lips.  He denied any associated visual changes, difficulty  swallowing, shortness of breath, chest pain, palpitations.  The patient  did notice a small sore along the inner portion of his right nasal  passage.  The patient complains it is quite tender along his right side  of his nose and feels a small sore of this area.  The patient does have  upper and lower dentures, and had no significant swelling or irritation  of his gums that are known.  The patient was sent for a CT of the  sinuses which showed a pan sinusitis with acute on chronic bilateral  maxillary sinus disease.  Due to the patient's significant facial  swelling and associated facial cellulitis.  The patient will need to be  admitted for IV antibiotics.   PAST MEDICAL HISTORY:  1. Hypertension.  2. Hyperlipidemia.  3. Colon polyps.  4. Diverticulosis followed by Dr. Marina Goodell with most recent colon in      December 2007.  5. Atypical chest pain with negative cath in December 2002.   CURRENT MEDICATIONS:  1.  Chlorthalidone 25 mg daily.  2. Crestor 20 mg q.h.s.  3. Exforge 5/160 daily.  4. Aspirin 81 mg daily.  5. Symbicort 160 4.5  two puffs b.i.d.  6. Albuterol p.r.n.   DRUG ALLERGIES:  PENICILLIN LIPITOR AND ACE INHIBITORS.   SOCIAL HISTORY:  The patient is divorced.  Has three children.  Works in  a Veterinary surgeon daily.  Never smoked.  No alcohol.   REVIEW OF SYSTEMS:  Essentially negative except as noted above.   PHYSICAL EXAMINATION:  GENERAL:  The patient appears mildly ill  VITAL SIGNS: Temperature 98.9, blood pressure 142/90, O2 saturation 94%  on room air.  Weight is at 180.  HEENT:  Nasal mucosa is edematous with some mild erythema.  The patient  has extensive edema along the right side of his face from his right eye  down into his right cheek and lips.  Conjunctiva noninjected.  The  posterior pharynx is clear.  The patient's upper gums and  lower gums  without significant erythema or swelling..  The patient has significant  tenderness along the right side and maxillary tenderness of the sinuses.  TMs showed some right TM swelling or redness.  NECK:  Supple without cervical adenopathy.  Negative nuchal rigidity.  No palpable thyromegaly or nodules appreciated.  LUNG:  Sounds are clear.  CARDIAC: Regular rate and rhythm.  ABDOMEN:  Soft, nontender.  No palpable masses.Marland Kitchen  EXTREMITIES:  Warm without any calf cyanosis, clubbing or edema.  NEUROLOGICAL:  No focal deficits detected.   LABORATORY DATA:  CT of the sinuses on today read as a pan sinusitis  with acute on chronic bilateral maxillary sinus disease.  This was done  at [facility] CT scan.   Hemoglobin 15.8, white blood cell count 13,800, platelet count 182,000,  neutrophils 88.1.   IMPRESSION AND PLAN:  Acute pan sinusitis with a right facial  cellulitis.  Due to the patient's extensive right facial swelling, the  patient will be admitted for IV antibiotics starting on Avelox,  Mucolytics with Mucinex.  Blood culture  and chest x-ray are pending.  Have contacted ENT, Dr. Jearld Fenton office for a consult for further  evaluation and treatment options to much appreciate their input.      Rubye Oaks, NP      Clinton D. Maple Hudson, MD, Tonny Bollman, FACP  Electronically Signed    TP/MEDQ  D:  04/23/2007  T:  04/25/2007  Job:  [id number]   cc:   Joni Fears D. Maple Hudson, MD, FCCP, FACP

## 2011-01-10 NOTE — Discharge Summary (Signed)
Matthew Gray, NAZIR NO.:  [id number]   MEDICAL RECORD NO.:  [id number]          PATIENT TYPE:  INP   LOCATION:  6702                         FACILITY:  [facility]   PHYSICIAN:  Clinton D. Maple Hudson, MD, FCCP, FACPDATE OF BIRTH:  [birth year]   DATE OF ADMISSION:  04/23/2007  DATE OF DISCHARGE:  04/27/2007                               DISCHARGE SUMMARY   DISCHARGE DIAGNOSES:  1. Cellulitis of the face.  2. Acute maxillary sinusitis.  3. Byssinosis/chronic bronchitis.   PAST MEDICAL HISTORY:  Hypertension, hyperlipidemia, colon polyps,  diverticulosis, atypical chest pain with negative heart catheterization  December 2002,   ALLERGIES:  DRUG INTOLERANCE TO PENICILLIN, LIPITOR, AND ACE INHIBITORS.   BRIEF HISTORY:  A 77 year old man with a history of chronic bronchitis,  partly associated with cotton dust exposure/byssinosis, who developed  progressive edema over the face, starting with a pimple at the right  side of his nose one day before admission.  He presented to the office  with marked right facial swelling.  CT scan of the sinuses demonstrated  pansinusitis, with acute on chronic bilateral maxillary sinus disease.  He was admitted for intravenous antibiotics, stabilization, and  assessment.   PHYSICAL EXAMINATION:  Significant for upper and lower dentures.  Temperature 98.9, blood pressure 142/90.  There was extensive edema of  the right face from the level of the eye to the chin.  Conjunctivae were  not involved.  There was facial tenderness.  White blood count was  13,800 with 88% neutrophils.   HOSPITAL COURSE:  Outpatient blood pressure medications were continued.  He was treated with vancomycin and Cleocin.  ENT consultation was  obtained with Dr. Suzanna Obey, with impression of right facial  cellulitis.  Pharmacy assisted with antibiotic dosing.  He was  discharged on doxycycline, per recommendation of Dr. Jearld Fenton, with  condition much improved and was  felt to have reached maximum hospital  benefit at the time of discharge.   LABORATORY:  CT scan initially reported was done at the [facility] CT  machine.  Chest x-ray showed emphysema without acute disease.  Heart  size normal, bibasilar scarring, and hyperinflation.  Initial WBC  12,500, platelet count 166,000.  At discharge, WBC 8,300.  Renal  function normal.  Liver functions normal, except total bilirubin 1.6.  Urinalysis clear, except for ketones on admission reflecting diminished  p.o. intake.  Blood cultures negative.   DISCHARGE/PLAN:  Hot compresses, regular diet.  He is to keep outpatient  follow-up, as scheduled.   DISCHARGE MEDICATIONS:  1. Aspirin 81 mg.  2. Exforge 5 mg as 160.  3. Crestor 20 mg.  4. Chlorthalidone 25 mg.  5. Symbicort 160/4.5, currently taking 1 puff daily.  6. Albuterol rescue inhaler 2 puffs q.i.d. p.r.n.      Clinton D. Maple Hudson, MD, Tonny Bollman, FACP  Electronically Signed     CDY/MEDQ  D:  07/02/2007  T:  07/02/2007  Job:  [id number]

## 2011-01-10 NOTE — Cardiovascular Report (Signed)
[facility]  Patient:    Matthew Gray, Matthew Gray Visit Number: [id number] MRN: [id number]          Service Type: CAT Location: [facility] 2899 [id number] Attending Physician:  Daisey Must Dictated by:   Daisey Must, M.D. [facility] Proc. Date: 08/11/01 Admit Date:  08/11/2001   CC:         Corwin Levins, M.D. [facility]  Cardiac Catheterization Laboratory   Cardiac Catheterization  PROCEDURES PERFORMED: Left heart catheterization with coronary angiography and left ventriculography.  INDICATIONS: The patient is a 77 year old male with a history of COPD. He has been having progressive exertional dyspnea and mild chest discomfort. A stress Cardiolite scan was interpreted as revealing inferior ischemia. We thus opted to proceed with cardiac catheterization.  DESCRIPTION OF PROCEDURE: A 6 French sheath was placed in the right femoral artery.  Coronary angiography was performed with Standard Judkins 6 French catheters. Left ventriculography was performed with an angled pigtail catheter. Contrast was Omnipaque. There were no complications.  RESULTS:  HEMODYNAMICS: Left ventricular pressure 132/15.  Aortic pressure 132/90. There was no aortic valve gradient.  LEFT VENTRICULOGRAM: Wall motion is normal. Ejection fraction calculated at 58%. There is no mitral regurgitation.  CORONARY ARTERIOGRAPHY: (Right dominant).  Left main: Normal.  Left anterior descending: The left anterior descending artery has a 20% stenosis in the proximal vessel. The LAD gives rise to a small first diagonal and normal sized second diagonal.  Left circumflex: The left circumflex gives rise to a large first obtuse marginal branch and small second and third obtuse marginal branches. The left circumflex has only minor luminal irregularities.  Right coronary artery: The right coronary artery has a 20% stenosis in the proximal vessel. The distal right coronary artery gives rise to a large posterior  descending artery and a small posterolateral branch.  IMPRESSIONS: 1. Normal left ventricular systolic function. 2. No significant coronary artery disease identified. Dictated by:   Daisey Must, M.D. [facility] Attending Physician:  Daisey Must DD:  08/11/01 TD:  08/11/01 Job: [id number]

## 2011-05-15 ENCOUNTER — Encounter: Payer: Self-pay | Admitting: Internal Medicine

## 2011-05-15 ENCOUNTER — Ambulatory Visit (INDEPENDENT_AMBULATORY_CARE_PROVIDER_SITE_OTHER): Payer: Medicare PPO | Admitting: Internal Medicine

## 2011-05-15 VITALS — BP 146/84 | HR 66 | Ht 68.0 in | Wt 161.0 lb

## 2011-05-15 DIAGNOSIS — J4489 Other specified chronic obstructive pulmonary disease: Secondary | ICD-10-CM

## 2011-05-15 DIAGNOSIS — J449 Chronic obstructive pulmonary disease, unspecified: Secondary | ICD-10-CM

## 2011-05-15 LAB — PULMONARY FUNCTION TEST

## 2011-05-15 MED ORDER — MOMETASONE FURO-FORMOTEROL FUM 100-5 MCG/ACT IN AERO: 2.0000 | INHALATION_SPRAY | Freq: Two times a day (BID) | RESPIRATORY_TRACT | Status: DC

## 2011-05-15 NOTE — Patient Instructions (Signed)
Sample and script  For Dulera 100-5   2 puffs and rinse mouth twice a day, every day  To open breathing passages.

## 2011-05-15 NOTE — Progress Notes (Signed)
PFT done today. 

## 2011-05-15 NOTE — Progress Notes (Signed)
Subjective:    Patient ID: Matthew Gray, male    DOB: 07/04/1934, 77 y.o.   MRN: [id number]  HPI 11/18/10-76 yo M followed here for hx asthma which was severe in past but much better in recent years, never smoked. 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 Last here June 05, 2010. He came in today, actually thinking he was going to Dr Jonny Ruiz about poor appetite. Weighed 166 in our office in October, 2011,> 161 now. Lives alone. Admits feeling depressed.  Denies pain, blood, dark stools, nausea, cough or wheeze. Denies fevers or sweats.  I reviewed Epic and [facility] EMRs, w/o finding new lab work.   05/15/11- 31 yo M never smoker, followed here for hx asthma/ chronic bronchitis which was severe in past but much better in recent years, and. 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 Good and bad days with his breathing. Realizes he isn't as mentally sharp as he used to be and breathing affects that.  Inhalers might have helped some, but used up and he is not clear they made a difference. He treats himself with salves and reading scripture. Talks about eating oatmeal to help his breathing.  Daily cough is always productive, but not really a problem for him. Sputum is white, never bloody. He denies fever, chest pain, palpitation, swollen glands, choking or coughing with meals, or recognized reflux.Marland Kitchen He declines a flu vaccine PFT today: Moderate to severe obstructive airways disease with response to bronchodilator. FEV1/FVC 0.56 with normal TLC and DLCO.  Review of Systems Constitutional:   No-   weight loss, night sweats, fevers, chills, fatigue, lassitude. HEENT:   No-  headaches, difficulty swallowing, tooth/dental problems, sore throat,       No-  sneezing, itching, ear ache, nasal congestion, post nasal drip,  CV:  No-   chest pain, orthopnea, PND, swelling in lower extremities, anasarca,  dizziness, palpitations Resp: + shortness of breath with exertion or at rest.              +   productive cough,  non-productive cough,  No-  coughing up of blood.              No-   change in color of mucus.  No- wheezing.   Skin: No-   rash or lesions. GI:  No-   heartburn, indigestion, abdominal pain, nausea, vomiting, diarrhea,                 change in bowel habits, loss of appetite GU: No-   dysuria, change in color of urine, no urgency or frequency.  No- flank pain. MS:  No-   joint pain or swelling.  No- decreased range of motion.  No- back pain. Neuro- grossly normal to observation, Or:  Psych:  No- change in mood or affect. No depression or anxiety.  No memory loss.     Objective:   Physical Exam General- Alert, Oriented, Affect-appropriate, Distress- none acute Skin- rash-none, lesions- none, excoriation- none Lymphadenopathy- none Head- atraumatic            Eyes- Gross vision intact, PERRLA, conjunctivae clear secretions            Ears- Hearing, canals-normal            Nose- Clear, no-Septal dev, mucus, polyps, erosion, perforation             Throat- Mallampati II , mucosa clear , drainage- none, tonsils- atrophic; dentures Neck- flexible , trachea midline, no stridor ,  thyroid nl, carotid no bruit Chest - symmetrical excursion , unlabored           Heart/CV- RRR , no murmur , no gallop  , no rub, nl s1 s2                           - JVD- none , edema- none, stasis changes- none, varices- none           Lung-  bilateral mild rhonchi, intermittent cough , dullness-none, rub- none           Chest wall-  Abd- tender-no, distended-no, bowel sounds-present, HSM- no Br/ Gen/ Rectal- Not done, not indicated Extrem- cyanosis- none, clubbing, none, atrophy- none, strength- nl Neuro- grossly intact to observation

## 2011-05-20 ENCOUNTER — Encounter: Payer: Self-pay | Admitting: Internal Medicine

## 2011-05-20 NOTE — Assessment & Plan Note (Signed)
His chronic bronchitis (byssinosis) pattern is unchanged. I am concerned that his mild dementia is beginning to affect his medication management. By PFT he clearly respond significantly to bronchodilator, but he allowed his inhalers to run out without seeking replacement. Plan-try resuming maintenance inhaler using Dulera. I asked him to reconsider his decision not to get a flu shot.

## 2011-05-22 ENCOUNTER — Ambulatory Visit: Payer: Self-pay | Admitting: Internal Medicine

## 2011-05-23 ENCOUNTER — Encounter: Payer: Self-pay | Admitting: Internal Medicine

## 2011-06-06 LAB — CBC
HCT: 44.5
MCHC: 33.4
Platelets: 218
RBC: 5.52
RBC: 5.37
WBC: 8.3
WBC: 12.5 — ABNORMAL HIGH

## 2011-06-06 LAB — COMPREHENSIVE METABOLIC PANEL
Albumin: 3.8
BUN: 16
Calcium: 9.3
Glucose, Bld: 83
Total Protein: 7.9

## 2011-06-06 LAB — URINALYSIS, ROUTINE W REFLEX MICROSCOPIC
Bilirubin Urine: NEGATIVE
Glucose, UA: NEGATIVE
Hgb urine dipstick: NEGATIVE
Ketones, ur: 15 — AB
pH: 5.5

## 2011-06-06 LAB — URINE CULTURE
Colony Count: NO GROWTH
Culture: NO GROWTH

## 2011-06-06 LAB — CULTURE, BLOOD (ROUTINE X 2): Culture: NO GROWTH

## 2011-06-06 LAB — DIFFERENTIAL
Lymphocytes Relative: 9 — ABNORMAL LOW
Lymphs Abs: 0.8
Monocytes Relative: 8
Neutrophils Relative %: 80 — ABNORMAL HIGH

## 2011-08-02 ENCOUNTER — Encounter: Payer: Self-pay | Admitting: Internal Medicine

## 2011-08-04 ENCOUNTER — Encounter: Payer: Self-pay | Admitting: Internal Medicine

## 2011-08-14 ENCOUNTER — Ambulatory Visit: Payer: Medicare PPO | Admitting: Internal Medicine

## 2011-09-08 ENCOUNTER — Encounter: Payer: Self-pay | Admitting: Internal Medicine

## 2011-09-08 ENCOUNTER — Ambulatory Visit (INDEPENDENT_AMBULATORY_CARE_PROVIDER_SITE_OTHER): Payer: Medicare PPO | Admitting: Internal Medicine

## 2011-09-08 VITALS — BP 140/100 | HR 74 | Ht 68.0 in | Wt 165.4 lb

## 2011-09-08 DIAGNOSIS — J4489 Other specified chronic obstructive pulmonary disease: Secondary | ICD-10-CM

## 2011-09-08 DIAGNOSIS — J449 Chronic obstructive pulmonary disease, unspecified: Secondary | ICD-10-CM

## 2011-09-08 DIAGNOSIS — J309 Allergic rhinitis, unspecified: Secondary | ICD-10-CM

## 2011-09-08 DIAGNOSIS — R634 Abnormal weight loss: Secondary | ICD-10-CM

## 2011-09-08 NOTE — Patient Instructions (Signed)
Sample Tudorza inhaler   1 slow puff, twice daily     Call for prescription if you like it.

## 2011-09-08 NOTE — Progress Notes (Signed)
Patient ID: Matthew Gray, male    DOB: [birth year], 78 y.o.   MRN: [id number]  HPI 11/18/10-76 yo M followed here for hx asthma which was severe in past but much better in recent years, never smoked. 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 Last here June 05, 2010. He came in today, actually thinking he was going to Dr Jonny Ruiz about poor appetite. Weighed 166 in our office in October, 2011,> 161 now. Lives alone. Admits feeling depressed.  Denies pain, blood, dark stools, nausea, cough or wheeze. Denies fevers or sweats.  I reviewed [employer] and [facility] EMRs, w/o finding new lab work.   05/15/11- 52 yo M never smoker, followed here for hx asthma/ chronic bronchitis which was severe in past but much better in recent years, and. 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 Good and bad days with his breathing. Realizes he isn't as mentally sharp as he used to be and breathing affects that.  Inhalers might have helped some, but used up and he is not clear they made a difference. He treats himself with salves and reading scripture. Talks about eating oatmeal to help his breathing.  Daily cough is always productive, but not really a problem for him. Sputum is white, never bloody. He denies fever, chest pain, palpitation, swollen glands, choking or coughing with meals, or recognized reflux.Marland Kitchen He declines a flu vaccine PFT today: Moderate to severe obstructive airways disease with response to bronchodilator. FEV1/FVC 0.56 with normal TLC and DLCO.  09/08/11- 76 yo M never smoker, followed here for hx asthma/ chronic bronchitis which was severe in past but much better in recent years, and had Byssinosis from cotton mill dust exposure. He had flu vaccine. Just doesn't feel well, but nonspecific. Cough is routine every day and usually productive of some thick white sputum with no blood or pain. Legs feel heavy. He did not refill Dulera after using sample, finding little benefit.  PFT 05/15/2011- severe  obstructive airways disease with response to bronchodilator. FEV1 1.68/62% , FEV1/FVC 0.56, DLCO 103%. This pattern favors bronchitis /asthma over emphysema.  Review of Systems Constitutional:   No-   weight loss, night sweats, fevers, chills, fatigue, lassitude. HEENT:   No-  headaches, difficulty swallowing, tooth/dental problems, sore throat,       No-  sneezing, itching, ear ache, nasal congestion, post nasal drip,  CV:  No-   chest pain, orthopnea, PND, swelling in lower extremities, anasarca,  dizziness, palpitations Resp: + shortness of breath with exertion or at rest.              +  productive cough,  non-productive cough,  No-  coughing up of blood.              No-   change in color of mucus.  No- wheezing.   Skin: No-   rash or lesions. GI:  No-   heartburn, indigestion, abdominal pain, nausea, vomiting, diarrhea,                 change in bowel habits, loss of appetite GU: . MS:  No-   joint pain or swelling.  No- decreased range of motion.  No- back pain. Neuro- grossly normal to observation, Or:  Psych:  No- change in mood or affect. No depression or anxiety.  No memory loss.     Objective:   Physical Exam General- Alert, Oriented, Affect-appropriate, Distress- none acute  BP 140/100 Skin- rash-none, lesions- none, excoriation- none Lymphadenopathy- none Head-  atraumatic            Eyes- Gross vision intact, PERRLA, conjunctivae clear secretions            Ears- Hearing, canals-normal            Nose- Clear, no-Septal dev, mucus, polyps, erosion, perforation             Throat- Mallampati II , mucosa clear , drainage- none, tonsils- atrophic; dentures Neck- flexible , trachea midline, no stridor , thyroid nl, carotid no bruit Chest - symmetrical excursion , unlabored           Heart/CV- RRR , no murmur , no gallop  , no rub, nl s1 s2                           - JVD- none , +edema- trace, sock line. , stasis changes- none, varices- none           Lung-  bilateral mild  rhonchi, intermittent loose cough , dullness-none, rub- none           Chest wall-  Abd-  Br/ Gen/ Rectal- Not done, not indicated Extrem- cyanosis- none, clubbing, none, atrophy- none, strength- nl Neuro- grossly intact to observation

## 2011-09-09 ENCOUNTER — Encounter: Payer: Self-pay | Admitting: Internal Medicine

## 2011-09-09 NOTE — Assessment & Plan Note (Signed)
Chronic bronchitis with chronic rhonchi and cough but no real progression. He did not find benefit from adrenergic inhaler Dulera . Plan- sample Tudorza for comparison.

## 2011-09-09 NOTE — Assessment & Plan Note (Signed)
05/15/11- 161 lbs 08/29/11- 165 lbs   No documented weight loss over this interval.

## 2011-09-09 NOTE — Assessment & Plan Note (Signed)
Seasonal and perennial allergic rhinitis. These have been fairly well controlled in recent years compared to when I first met him. Discussed use of OTC antihistamine if needed.

## 2011-09-10 ENCOUNTER — Encounter: Payer: Self-pay | Admitting: Internal Medicine

## 2011-09-10 ENCOUNTER — Other Ambulatory Visit (INDEPENDENT_AMBULATORY_CARE_PROVIDER_SITE_OTHER): Payer: Medicare PPO

## 2011-09-10 ENCOUNTER — Ambulatory Visit (INDEPENDENT_AMBULATORY_CARE_PROVIDER_SITE_OTHER): Payer: Medicare PPO | Admitting: Internal Medicine

## 2011-09-10 VITALS — BP 140/90 | HR 78 | Temp 97.4°F | Ht 69.0 in | Wt 168.4 lb

## 2011-09-10 DIAGNOSIS — R609 Edema, unspecified: Secondary | ICD-10-CM

## 2011-09-10 DIAGNOSIS — I10 Essential (primary) hypertension: Secondary | ICD-10-CM

## 2011-09-10 DIAGNOSIS — F329 Major depressive disorder, single episode, unspecified: Secondary | ICD-10-CM

## 2011-09-10 DIAGNOSIS — J4489 Other specified chronic obstructive pulmonary disease: Secondary | ICD-10-CM

## 2011-09-10 DIAGNOSIS — Z0000 Encounter for general adult medical examination without abnormal findings: Secondary | ICD-10-CM

## 2011-09-10 DIAGNOSIS — F3289 Other specified depressive episodes: Secondary | ICD-10-CM

## 2011-09-10 DIAGNOSIS — J449 Chronic obstructive pulmonary disease, unspecified: Secondary | ICD-10-CM

## 2011-09-10 DIAGNOSIS — R600 Localized edema: Secondary | ICD-10-CM

## 2011-09-10 LAB — CBC WITH DIFFERENTIAL/PLATELET
Basophils Absolute: 0 10*3/uL (ref 0.0–0.1)
HCT: 44.5 % (ref 39.0–52.0)
Lymphs Abs: 0.7 10*3/uL (ref 0.7–4.0)
MCV: 83 fl (ref 78.0–100.0)
Monocytes Absolute: 0.3 10*3/uL (ref 0.1–1.0)
Platelets: 186 10*3/uL (ref 150.0–400.0)
RDW: 14.9 % — ABNORMAL HIGH (ref 11.5–14.6)

## 2011-09-10 LAB — BASIC METABOLIC PANEL
GFR: 95.86 mL/min (ref >60.00)
Glucose, Bld: 82 mg/dL (ref 70–99)
Potassium: 4.4 mEq/L (ref 3.5–5.1)
Sodium: 142 mEq/L (ref 135–145)

## 2011-09-10 LAB — HEPATIC FUNCTION PANEL
AST: 22 U/L (ref 0–37)
Albumin: 3.7 g/dL (ref 3.5–5.2)
Alkaline Phosphatase: 67 U/L (ref 39–117)
Total Bilirubin: 0.7 mg/dL (ref 0.3–1.2)

## 2011-09-10 MED ORDER — VALSARTAN-HYDROCHLOROTHIAZIDE 320-25 MG PO TABS: 1.0000 | ORAL_TABLET | Freq: Every day | ORAL | Status: DC

## 2011-09-10 MED ORDER — HYDROCHLOROTHIAZIDE 25 MG PO TABS: 25.0000 mg | ORAL_TABLET | Freq: Every day | ORAL | Status: DC

## 2011-09-10 NOTE — Assessment & Plan Note (Addendum)
ECG reviewed as per emr, meds adjusted to diovan hct as above,  to f/u any worsening symptoms or concerns BP Readings from Last 3 Encounters:  09/10/11 140/90  09/08/11 140/100  05/15/11 146/84

## 2011-09-10 NOTE — Patient Instructions (Signed)
Ok to take the HCTZ fluid pill 25 mg with the diovan you have for one month to use up the diovan, then change the whole thing to Diovan HCT 320mg /25mg  - 1 per day Continue all other medications as before Your EKG was ok today You will be contacted regarding the referral for: echocardiogram Please go to LAB in the Basement for the blood and/or urine tests to be done today Please call the phone number [phone] (the [employer] System) for results of testing in 2-3 days;  When calling, simply dial the number, and when prompted enter the MRN number above (the Medical Record Number) and the # key, then the message should start. Please return in 6 mo with Lab testing done 3-5 days before

## 2011-09-11 ENCOUNTER — Encounter: Payer: Self-pay | Admitting: Internal Medicine

## 2011-09-11 LAB — TSH: TSH: 1.16 u[IU]/mL (ref 0.35–5.50)

## 2011-09-11 NOTE — Assessment & Plan Note (Signed)
stable overall by hx and exam, most recent data reviewed with pt, and pt to continue medical treatment as before, cont f/u with pulm

## 2011-09-11 NOTE — Progress Notes (Signed)
Subjective:    Patient ID: Matthew Gray, male    DOB: [birth year], 78 y.o.   MRN: [id number]  HPI  Here to f/u;  BP med recently adjusted with change of exforge to diovan 160 bid, but pt realizes on coming here today he is actually only taking 160 qam; in addition LE edema seems persistent, maybe mildly worse with discomfort to the legs below the knees, itching but without ulcer, erythema, or worse with exertion.  Pt denies chest pain, increased sob or doe, wheezing, orthopnea, PND, palpitations, dizziness or syncope.  Pt denies new neurological symptoms such as new headache, or facial or extremity weakness or numbness   Pt denies polydipsia, polyuria.  Denies worsening depressive symptoms, suicidal ideation, or panic. No other new complaints.  Overall good compliance with treatment, and good medicine tolerability except for the above Past Medical History  Diagnosis Date  . 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 VITAMIN D DEFICIENCY 06/04/2010  . HYPERLIPIDEMIA 04/03/2007  . DEPRESSION 11/30/2009  . HYPERTENSION 04/03/2007  . CORONARY ARTERY DISEASE 10/27/2007  . PERIPHERAL VASCULAR DISEASE 10/27/2007  . SINUSITIS- ACUTE-NOS 10/08/2009  . ALLERGIC RHINITIS 10/27/2007  . CHRONIC OBSTRUCTIVE PULMONARY DISEASE, ACUTE EXACERBATION 11/22/2007  . ASTHMA 10/27/2007  . COPD 10/27/2007  . BENIGN PROSTATIC HYPERTROPHY 10/27/2007  . SHOULDER PAIN, LEFT 06/21/2009  . ANKLE PAIN, RIGHT 11/30/2009  . SCIATICA, LEFT 11/08/2009  . BACK PAIN 10/08/2009  . MYALGIA 02/04/2008  . Pain in Soft Tissues of Limb 02/04/2008  . FATIGUE 11/30/2009  . RASH-NONVESICULAR 10/27/2007  . PERIPHERAL EDEMA 11/16/2009  . Hemoptysis 04/24/2009  . COLONIC POLYPS, HX OF 10/27/2007  . PARESTHESIA 09/02/2010   No past surgical history on file.  reports that he has never smoked. He does not have any smokeless tobacco history on file. He reports that he does not drink alcohol or use illicit drugs. family history includes Alcohol abuse in his other; Anuerysm  in his brother; Cancer in his other; Hypertension in his sister; and Stroke in his other. Allergies  Allergen Reactions  . Ace Inhibitors   . Atorvastatin     REACTION: myalgias  . Penicillins   . Simvastatin     REACTION: myalgias   Current Outpatient Prescriptions on File Prior to Visit  Medication Sig Dispense Refill  . albuterol (PROAIR HFA) 108 (90 BASE) MCG/ACT inhaler Inhale 2 puffs into the lungs. 2 puffs four times a day as needed rescue inhaler       . aspirin (ECOTRIN LOW STRENGTH) 81 MG EC tablet Take 81 mg by mouth daily.        . carvedilol (COREG) 6.25 MG tablet Take 1 tablet by mouth Twice daily.      . Cholecalciferol (VITAMIN D3) 2000 UNITS capsule Take 2,000 Units by mouth daily.        . fexofenadine (ALLEGRA) 180 MG tablet Take 180 mg by mouth daily. For allergies       . fluticasone (FLONASE) 50 MCG/ACT nasal spray 2 sprays by Nasal route daily.        . mometasone-formoterol (DULERA) 100-5 MCG/ACT AERO Inhale 2 puffs into the lungs 2 (two) times daily. Rinse mouth  1 Inhaler  prn  . lovastatin (MEVACOR) 40 MG tablet TAKE ONE TABLET BY MOUTH DAILY  30 tablet  9   Review of Systems Review of Systems  Constitutional: Negative for diaphoresis and unexpected weight change.  HENT: Negative for drooling and tinnitus.   Eyes: Negative for photophobia and visual disturbance.  Respiratory: Negative  for choking and stridor.   Gastrointestinal: Negative for vomiting and blood in stool.  Genitourinary: Negative for hematuria and decreased urine volume.  Objective:   Physical Exam BP 140/90  Pulse 78  Temp(Src) 97.4 F (36.3 C) (Oral)  Ht 5\' 9"  (1.753 m)  Wt 168 lb 6 oz (76.374 kg)  BMI 24.86 kg/m2  SpO2 96% Physical Exam  VS noted Constitutional: Pt appears well-developed and well-nourished.  HENT: Head: Normocephalic.  Right Ear: External ear normal.  Left Ear: External ear normal.  Eyes: Conjunctivae and EOM are normal. Pupils are equal, round, and reactive to  light.  Neck: Normal range of motion. Neck supple.  Cardiovascular: Normal rate and regular rhythm.   Pulmonary/Chest: Effort normal and breath sounds without rales or wheezing Neurological: Pt is alert. No cranial nerve deficit.  Skin: Skin is warm. No erythema. 1-2+ edema to knees bilat, no ulcers,  Psychiatric: Pt behavior is normal. Thought content normal. not depressed affect    Assessment & Plan:

## 2011-09-11 NOTE — Assessment & Plan Note (Signed)
stable overall by hx and exam, most recent data reviewed with pt, and pt to continue medical treatment as before Lab Results  Component Value Date   WBC 5.4 11/18/2010   HGB 14.0 11/18/2010   HCT 41.2 11/18/2010   PLT 166.0 11/18/2010   GLUCOSE 70 11/18/2010   CHOL 153 11/22/2010   TRIG 34.0 11/22/2010   HDL 71.60 11/22/2010   LDLDIRECT 131.5 06/24/2006   LDLCALC 75 11/22/2010   ALT 11 11/18/2010   AST 17 11/18/2010   NA 136 11/18/2010   K 4.1 11/18/2010   CL 100 11/18/2010   CREATININE 0.9 11/18/2010   BUN 18 11/18/2010   CO2 29 11/18/2010   TSH 0.58 11/22/2010   PSA 0.89 11/22/2010

## 2011-09-11 NOTE — Assessment & Plan Note (Addendum)
?   Etiology, in retrospect may not have been related to the amlodipine in exforge;  ?venous insuff vs other ; ECG reviewed as per emr, also for echo; to change diovan to diovan hct, f/u wt and exam next visit; also for labs today - r/o renal insuff, anemia or other

## 2011-09-22 ENCOUNTER — Ambulatory Visit (HOSPITAL_COMMUNITY): Payer: Medicare PPO | Attending: Cardiology | Admitting: Radiology

## 2011-09-22 DIAGNOSIS — R0609 Other forms of dyspnea: Secondary | ICD-10-CM | POA: Insufficient documentation

## 2011-09-22 DIAGNOSIS — J449 Chronic obstructive pulmonary disease, unspecified: Secondary | ICD-10-CM | POA: Insufficient documentation

## 2011-09-22 DIAGNOSIS — R0989 Other specified symptoms and signs involving the circulatory and respiratory systems: Secondary | ICD-10-CM | POA: Insufficient documentation

## 2011-09-22 DIAGNOSIS — I10 Essential (primary) hypertension: Secondary | ICD-10-CM

## 2011-09-22 DIAGNOSIS — R600 Localized edema: Secondary | ICD-10-CM

## 2011-09-22 DIAGNOSIS — I2510 Atherosclerotic heart disease of native coronary artery without angina pectoris: Secondary | ICD-10-CM | POA: Insufficient documentation

## 2011-09-22 DIAGNOSIS — R609 Edema, unspecified: Secondary | ICD-10-CM | POA: Insufficient documentation

## 2011-09-22 DIAGNOSIS — J4489 Other specified chronic obstructive pulmonary disease: Secondary | ICD-10-CM | POA: Insufficient documentation

## 2011-10-03 ENCOUNTER — Telehealth: Payer: Self-pay

## 2011-10-03 MED ORDER — HYDROCHLOROTHIAZIDE 25 MG PO TABS: 25.0000 mg | ORAL_TABLET | Freq: Every day | ORAL | Status: DC

## 2011-10-03 NOTE — Telephone Encounter (Signed)
Pharmacy requesting new rx for HCTZ 25 mg to take along with Diovan. The combo is now generic it would be cheaper for the patient. Please advise. [facility].

## 2011-10-03 NOTE — Telephone Encounter (Signed)
Why would she change from the combo pill if it is less expensive?  Please clarify

## 2011-10-03 NOTE — Telephone Encounter (Signed)
Ok with me, robin to handle

## 2011-10-03 NOTE — Telephone Encounter (Signed)
Called the patient and he needs #30 of the HCTZ 25 mg to take along with the Diovan to finish that bottle, then he will start the new rx written on Jan. 16, 2013 of Diovan HCTZ 320/25 combination.

## 2011-11-11 ENCOUNTER — Telehealth: Payer: Self-pay | Admitting: Internal Medicine

## 2011-11-11 MED ORDER — VALSARTAN-HYDROCHLOROTHIAZIDE 320-25 MG PO TABS: 1.0000 | ORAL_TABLET | Freq: Every day | ORAL | Status: DC

## 2011-11-11 NOTE — Telephone Encounter (Signed)
The pt called and is hoping to get a Diovan HCT refill sent to the [facility].  Thanks

## 2011-11-24 ENCOUNTER — Ambulatory Visit: Payer: Medicare PPO | Admitting: Internal Medicine

## 2011-12-31 ENCOUNTER — Other Ambulatory Visit: Payer: Self-pay

## 2011-12-31 MED ORDER — LOVASTATIN 40 MG PO TABS: 40.0000 mg | ORAL_TABLET | Freq: Every day | ORAL | Status: DC

## 2012-01-12 ENCOUNTER — Telehealth: Payer: Self-pay | Admitting: Internal Medicine

## 2012-01-12 DIAGNOSIS — M25511 Pain in right shoulder: Secondary | ICD-10-CM

## 2012-01-12 NOTE — Telephone Encounter (Signed)
The pt came into the office and stated he is having right shoulder pain.  He was offered an ov, but declined, stating he wanted to see a "shoulder specialist".  I told him i would relay this msg, but I was not sure about a doctor that only dealt with shoulders.  Please advise if you would like for him to come in for an ov, i'll be happy to schedule him.   Thanks!

## 2012-01-14 NOTE — Telephone Encounter (Signed)
Ortho refer done 

## 2012-03-08 ENCOUNTER — Encounter: Payer: Self-pay | Admitting: Internal Medicine

## 2012-03-08 ENCOUNTER — Ambulatory Visit (INDEPENDENT_AMBULATORY_CARE_PROVIDER_SITE_OTHER): Payer: Medicare PPO | Admitting: Internal Medicine

## 2012-03-08 ENCOUNTER — Other Ambulatory Visit (INDEPENDENT_AMBULATORY_CARE_PROVIDER_SITE_OTHER): Payer: Medicare PPO

## 2012-03-08 VITALS — BP 122/78 | HR 58 | Temp 97.0°F | Ht 68.0 in | Wt 161.2 lb

## 2012-03-08 DIAGNOSIS — Z79899 Other long term (current) drug therapy: Secondary | ICD-10-CM

## 2012-03-08 DIAGNOSIS — G609 Hereditary and idiopathic neuropathy, unspecified: Secondary | ICD-10-CM

## 2012-03-08 DIAGNOSIS — Z0000 Encounter for general adult medical examination without abnormal findings: Secondary | ICD-10-CM

## 2012-03-08 DIAGNOSIS — M79604 Pain in right leg: Secondary | ICD-10-CM

## 2012-03-08 DIAGNOSIS — G629 Polyneuropathy, unspecified: Secondary | ICD-10-CM

## 2012-03-08 DIAGNOSIS — M79609 Pain in unspecified limb: Secondary | ICD-10-CM

## 2012-03-08 DIAGNOSIS — Z125 Encounter for screening for malignant neoplasm of prostate: Secondary | ICD-10-CM

## 2012-03-08 DIAGNOSIS — M79605 Pain in left leg: Secondary | ICD-10-CM

## 2012-03-08 HISTORY — DX: Polyneuropathy, unspecified: G62.9

## 2012-03-08 LAB — URINALYSIS, ROUTINE W REFLEX MICROSCOPIC
Hgb urine dipstick: NEGATIVE
Ketones, ur: NEGATIVE
Leukocytes, UA: NEGATIVE
Specific Gravity, Urine: 1.01 (ref 1.000–1.030)
Urobilinogen, UA: 0.2 (ref 0.0–1.0)

## 2012-03-08 LAB — BASIC METABOLIC PANEL
CO2: 31 mEq/L (ref 19–32)
Glucose, Bld: 96 mg/dL (ref 70–99)
Potassium: 4.7 mEq/L (ref 3.5–5.1)
Sodium: 142 mEq/L (ref 135–145)

## 2012-03-08 LAB — HEPATIC FUNCTION PANEL
AST: 22 U/L (ref 0–37)
Albumin: 3.7 g/dL (ref 3.5–5.2)
Alkaline Phosphatase: 51 U/L (ref 39–117)
Total Protein: 6.9 g/dL (ref 6.0–8.3)

## 2012-03-08 LAB — CBC WITH DIFFERENTIAL/PLATELET
Basophils Absolute: 0 10*3/uL (ref 0.0–0.1)
Eosinophils Absolute: 0.2 10*3/uL (ref 0.0–0.7)
HCT: 42.2 % (ref 39.0–52.0)
Lymphocytes Relative: 15.5 % (ref 12.0–46.0)
Lymphs Abs: 0.9 10*3/uL (ref 0.7–4.0)
Monocytes Relative: 8.6 % (ref 3.0–12.0)
Platelets: 144 10*3/uL — ABNORMAL LOW (ref 150.0–400.0)
RDW: 15.1 % — ABNORMAL HIGH (ref 11.5–14.6)

## 2012-03-08 LAB — TSH: TSH: 1.07 u[IU]/mL (ref 0.35–5.50)

## 2012-03-08 MED ORDER — GABAPENTIN 100 MG PO CAPS: ORAL_CAPSULE | ORAL | Status: DC

## 2012-03-08 NOTE — Progress Notes (Signed)
Subjective:    Patient ID: Matthew Gray, male    DOB: [birth year], 78 y.o.   MRN: [id number]  HPI   Here for wellness and f/u;  Overall doing ok;  Pt denies CP, worsening SOB, DOE, wheezing, orthopnea, PND, worsening LE edema, palpitations, dizziness or syncope.  Pt denies neurological change such as new Headache, facial or extremity weakness.  Pt denies polydipsia, polyuria, or low sugar symptoms. Pt states overall good compliance with treatment and medications but has been more forgetful and sister helps as he will not take all meds all the time on his own, otherwise good tolerability, and trying to follow lower cholesterol diet, though not always successful, lives alone.  Pt denies worsening depressive symptoms, suicidal ideation or panic or other signficant memory loss . No fever, wt loss, night sweats, loss of appetite, or other constitutional symptoms.  Pt states good ability with ADL's, low fall risk pe rpt, home safety reviewed and adequate, no significant changes in hearing or vision, and occasionally active with exercise, really none at all.  Also seen one time by another MD recently in [place] who helped a relative, here with sister today as well;  Did c/o burning pain to the feet and legs, worse at night trying to go to sleep, and the gabapentin 100 mg - 2 qhs has helped;  Needs refill as it seemed to help.  Also c/o sob and given inhaler to try.  Also with general weakness recently, as well as forgetfullness, and sister helps keep his medications straight for him, and the new inhaler helps as well.   Pt denies new neurological symptoms such as new headache, or facial or extremity weakness other than above.  Also with pain to the both legs below the knees some during the day worse with walking, better with rest and at night.   Past Medical History  Diagnosis Date  . 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 VITAMIN D DEFICIENCY 06/04/2010  . HYPERLIPIDEMIA 04/03/2007  . DEPRESSION 11/30/2009  .  HYPERTENSION 04/03/2007  . CORONARY ARTERY DISEASE 10/27/2007  . PERIPHERAL VASCULAR DISEASE 10/27/2007  . SINUSITIS- ACUTE-NOS 10/08/2009  . ALLERGIC RHINITIS 10/27/2007  . CHRONIC OBSTRUCTIVE PULMONARY DISEASE, ACUTE EXACERBATION 11/22/2007  . ASTHMA 10/27/2007  . COPD 10/27/2007  . BENIGN PROSTATIC HYPERTROPHY 10/27/2007  . SHOULDER PAIN, LEFT 06/21/2009  . ANKLE PAIN, RIGHT 11/30/2009  . SCIATICA, LEFT 11/08/2009  . BACK PAIN 10/08/2009  . MYALGIA 02/04/2008  . Pain in Soft Tissues of Limb 02/04/2008  . FATIGUE 11/30/2009  . RASH-NONVESICULAR 10/27/2007  . PERIPHERAL EDEMA 11/16/2009  . Hemoptysis 04/24/2009  . COLONIC POLYPS, HX OF 10/27/2007  . PARESTHESIA 09/02/2010  . Peripheral neuropathy 03/08/2012   No past surgical history on file.  reports that he has never smoked. He does not have any smokeless tobacco history on file. He reports that he does not drink alcohol or use illicit drugs. family history includes Alcohol abuse in his other; Anuerysm in his brother; Cancer in his other; Hypertension in his sister; and Stroke in his other. Allergies  Allergen Reactions  . Ace Inhibitors   . Atorvastatin     REACTION: myalgias  . Penicillins   . Simvastatin     REACTION: myalgias   Current Outpatient Prescriptions on File Prior to Visit  Medication Sig Dispense Refill  . albuterol (PROAIR HFA) 108 (90 BASE) MCG/ACT inhaler Inhale 2 puffs into the lungs. 2 puffs four times a day as needed rescue inhaler       .  aspirin (ECOTRIN LOW STRENGTH) 81 MG EC tablet Take 81 mg by mouth daily.        . Cholecalciferol (VITAMIN D3) 2000 UNITS capsule Take 2,000 Units by mouth daily.        . fexofenadine (ALLEGRA) 180 MG tablet Take 180 mg by mouth daily. For allergies       . fluticasone (FLONASE) 50 MCG/ACT nasal spray 2 sprays by Nasal route daily.        Marland Kitchen lovastatin (MEVACOR) 40 MG tablet Take 1 tablet (40 mg total) by mouth daily.  30 tablet  11  . mometasone-formoterol (DULERA) 100-5 MCG/ACT AERO Inhale 2  puffs into the lungs 2 (two) times daily. Rinse mouth  1 Inhaler  prn  . valsartan-hydrochlorothiazide (DIOVAN HCT) 320-25 MG per tablet Take 1 tablet by mouth daily.  90 tablet  3  . carvedilol (COREG) 6.25 MG tablet Take 1 tablet by mouth Twice daily.      Marland Kitchen gabapentin (NEURONTIN) 100 MG capsule 2 tabs by mouth at bedtime  180 capsule  1  . hydrochlorothiazide (HYDRODIURIL) 25 MG tablet Take 1 tablet (25 mg total) by mouth daily.  30 tablet  1   Review of Systems Review of Systems  Constitutional: Negative for diaphoresis, activity change, appetite change and unexpected weight change.  HENT: Negative for hearing loss, ear pain, facial swelling, mouth sores and neck stiffness.   Eyes: Negative for pain, redness and visual disturbance.  Respiratory: Negative for shortness of breath and wheezing.   Cardiovascular: Negative for chest pain and palpitations.  Gastrointestinal: Negative for diarrhea, blood in stool, abdominal distention and rectal pain.  Genitourinary: Negative for hematuria, flank pain and decreased urine volume.  Musculoskeletal: Negative for myalgias and joint swelling.  Skin: Negative for color change and wound.  Neurological: Negative for syncope Hematological: Negative for adenopathy.  Psychiatric/Behavioral: Negative for hallucinations, self-injury, decreased concentration and agitation.     Objective:   Physical Exam BP 122/78  Pulse 58  Temp 97 F (36.1 C) (Oral)  Ht 5\' 8"  (1.727 m)  Wt 161 lb 4 oz (73.143 kg)  BMI 24.52 kg/m2  SpO2 99% Physical Exam  VS noted Constitutional: Pt is oriented to person, place, and time. Appears well-developed and well-nourished.  HENT:  Head: Normocephalic and atraumatic.  Right Ear: External ear normal.  Left Ear: External ear normal.  Nose: Nose normal.  Mouth/Throat: Oropharynx is clear and moist.  Eyes: Conjunctivae and EOM are normal. Pupils are equal, round, and reactive to light.  Neck: Normal range of motion. Neck  supple. No JVD present. No tracheal deviation present.  Cardiovascular: Normal rate, regular rhythm, normal heart sounds and intact distal pulses.   Pulmonary/Chest: Effort normal and breath sounds normal.  Abdominal: Soft. Bowel sounds are normal. There is no tenderness.  Musculoskeletal: Normal range of motion. Exhibits no edema.  Lymphadenopathy:  Has no cervical adenopathy.  Neurological: Pt is alert and oriented to person, place, and time. Pt has normal reflexes. No cranial nerve deficit. Motor/dtr intact, has midl decreased sens to LT below knees Skin: Skin is warm and dry. No rash noted.  trace to 1+ bilat dorsalis pedis pulses  Psychiatric:  Has  normal mood and affect. Behavior is normal.     Assessment & Plan:

## 2012-03-08 NOTE — Patient Instructions (Addendum)
Continue all other medications as before, including the gabapentin at night You will be contacted regarding the referral for: Lower extremity arterial Dopplers (leg artery test) Please go to LAB in the Basement for the blood and/or urine tests to be done today You will be contacted by phone if any changes need to be made immediately.  Otherwise, you will receive a letter about your results with an explanation. Please return in 6 months, or sooner if needed

## 2012-03-08 NOTE — Assessment & Plan Note (Signed)

## 2012-03-08 NOTE — Assessment & Plan Note (Signed)
Some suspicion for sympt PVD - for LE art dopplers

## 2012-03-08 NOTE — Assessment & Plan Note (Signed)
Clinical dx, ok to cont the gabapentin 100 mg - 2 qhs as symptomatically improved to check b12 as well

## 2012-03-16 ENCOUNTER — Encounter (INDEPENDENT_AMBULATORY_CARE_PROVIDER_SITE_OTHER): Payer: Medicare PPO

## 2012-03-16 DIAGNOSIS — I739 Peripheral vascular disease, unspecified: Secondary | ICD-10-CM

## 2012-03-16 DIAGNOSIS — M79604 Pain in right leg: Secondary | ICD-10-CM

## 2012-03-18 ENCOUNTER — Other Ambulatory Visit: Payer: Self-pay | Admitting: Internal Medicine

## 2012-03-18 ENCOUNTER — Encounter: Payer: Self-pay | Admitting: Internal Medicine

## 2012-03-18 DIAGNOSIS — I739 Peripheral vascular disease, unspecified: Secondary | ICD-10-CM

## 2012-05-03 ENCOUNTER — Encounter: Payer: Self-pay | Admitting: Vascular Surgery

## 2012-05-04 ENCOUNTER — Ambulatory Visit (INDEPENDENT_AMBULATORY_CARE_PROVIDER_SITE_OTHER): Payer: Medicare PPO | Admitting: Vascular Surgery

## 2012-05-04 ENCOUNTER — Encounter: Payer: Self-pay | Admitting: Vascular Surgery

## 2012-05-04 VITALS — BP 120/64 | HR 56 | Resp 20 | Ht 68.0 in | Wt 154.0 lb

## 2012-05-04 DIAGNOSIS — I739 Peripheral vascular disease, unspecified: Secondary | ICD-10-CM

## 2012-05-04 NOTE — Progress Notes (Signed)
Subjective:     Patient ID: Matthew Gray, male   DOB: Dec 29, 1933, 78 y.o.   MRN: [id number]  HPI this 78 year old male patient was referred by Dr. Oliver Barre for vascular evaluation. Patient describes burning and numbness sensation in both feet right equal to left. This is been going on for several months or years. He does not have classic claudication. He states he is able to ambulate up to 1 mile without calf discomfort. He has no history of nonhealing ulcers, infection, cellulitis, or gangrene. He has no history of DVT.  Past Medical History  Diagnosis Date  . 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 VITAMIN D DEFICIENCY 06/04/2010  . HYPERLIPIDEMIA 04/03/2007  . DEPRESSION 11/30/2009  . HYPERTENSION 04/03/2007  . CORONARY ARTERY DISEASE 10/27/2007  . PERIPHERAL VASCULAR DISEASE 10/27/2007  . SINUSITIS- ACUTE-NOS 10/08/2009  . ALLERGIC RHINITIS 10/27/2007  . CHRONIC OBSTRUCTIVE PULMONARY DISEASE, ACUTE EXACERBATION 11/22/2007  . ASTHMA 10/27/2007  . COPD 10/27/2007  . BENIGN PROSTATIC HYPERTROPHY 10/27/2007  . SHOULDER PAIN, LEFT 06/21/2009  . ANKLE PAIN, RIGHT 11/30/2009  . SCIATICA, LEFT 11/08/2009  . BACK PAIN 10/08/2009  . MYALGIA 02/04/2008  . Pain in Soft Tissues of Limb 02/04/2008  . FATIGUE 11/30/2009  . RASH-NONVESICULAR 10/27/2007  . PERIPHERAL EDEMA 11/16/2009  . Hemoptysis 04/24/2009  . COLONIC POLYPS, HX OF 10/27/2007  . PARESTHESIA 09/02/2010  . Peripheral neuropathy 03/08/2012    History  Substance Use Topics  . Smoking status: Never Smoker   . Smokeless tobacco: Never Used  . Alcohol Use: No    Family History  Problem Relation Age of Onset  . Hypertension Sister     3 sisters  . Anuerysm Brother     died with  . Stroke Other   . Cancer Other     lung and breast  . Alcohol abuse Other   . Cancer Mother     BREAST  . Cancer Father     LUNG    Allergies  Allergen Reactions  . Ace Inhibitors   . Atorvastatin     REACTION: myalgias  . Penicillins   . Simvastatin     REACTION:  myalgias    Current outpatient prescriptions:albuterol (PROAIR HFA) 108 (90 BASE) MCG/ACT inhaler, Inhale 2 puffs into the lungs. 2 puffs four times a day as needed rescue inhaler , Disp: , Rfl: ;  aspirin (ECOTRIN LOW STRENGTH) 81 MG EC tablet, Take 81 mg by mouth daily.  , Disp: , Rfl: ;  carvedilol (COREG) 6.25 MG tablet, Take 1 tablet by mouth Twice daily., Disp: , Rfl:  Cholecalciferol (VITAMIN D3) 2000 UNITS capsule, Take 2,000 Units by mouth daily.  , Disp: , Rfl: ;  fexofenadine (ALLEGRA) 180 MG tablet, Take 180 mg by mouth daily. For allergies , Disp: , Rfl: ;  fluticasone (FLONASE) 50 MCG/ACT nasal spray, 2 sprays by Nasal route daily.  , Disp: , Rfl: ;  gabapentin (NEURONTIN) 100 MG capsule, 2 tabs by mouth at bedtime, Disp: 180 capsule, Rfl: 1 lovastatin (MEVACOR) 40 MG tablet, Take 1 tablet (40 mg total) by mouth daily., Disp: 30 tablet, Rfl: 11;  mometasone-formoterol (DULERA) 100-5 MCG/ACT AERO, Inhale 2 puffs into the lungs 2 (two) times daily. Rinse mouth, Disp: 1 Inhaler, Rfl: prn;  valsartan-hydrochlorothiazide (DIOVAN HCT) 320-25 MG per tablet, Take 1 tablet by mouth daily., Disp: 90 tablet, Rfl: 3  BP 120/64  Pulse 56  Resp 20  Ht 5\' 8"  (1.727 m)  Wt 154 lb (69.854 kg)  BMI 23.42  kg/m2  Body mass index is 23.42 kg/(m^2).          Review of Systems denies chest pain, does complain of mild dyspnea on exertion. Denies PND, orthopnea, lateralizing weakness, aphasia, amaurosis fugax, all other systems negative and a complete review of systems     Objective:   Physical Exam blood pressure 120/64 heart rate 86 respirations 20 Gen.-alert and oriented x3 in no apparent distress HEENT normal for age Lungs no rhonchi or wheezing Cardiovascular regular rhythm no murmurs carotid pulses 3+ palpable no bruits audible Abdomen soft nontender no palpable masses Musculoskeletal free of  major deformities Skin clear -no rashes Neurologic normal Lower extremities 3+ femoral and  popliteal pulses palpable bilaterally. No tibial pulses palpable. Both feet well perfused with no evidence of gangrene, infection, ulceration, or ischemia. No varicosities are noted.  I reviewed the arterial Doppler studies performed at [facility] heart care. This reveals evidence of bilateral tibial occlusive disease with excellent waveforms at the popliteal level and ABIs of 0.74 the right and 0.67 on the left      Assessment:     #1 bilateral tibial occlusive disease-no limiting symptoms-no claudication or limb threatening ischemia #2 peripheral neuropathy causing current symptoms    Plan:     No need for further vascular evaluation-patient symptoms due to  peripheral neuropathy

## 2012-05-21 ENCOUNTER — Encounter: Payer: Self-pay | Admitting: Internal Medicine

## 2012-08-25 DIAGNOSIS — F03C0 Unspecified dementia, severe, without behavioral disturbance, psychotic disturbance, mood disturbance, and anxiety: Secondary | ICD-10-CM

## 2012-08-25 DIAGNOSIS — F0390 Unspecified dementia without behavioral disturbance: Secondary | ICD-10-CM

## 2012-08-25 HISTORY — DX: Unspecified dementia without behavioral disturbance: F03.90

## 2012-08-25 HISTORY — DX: Unspecified dementia, severe, without behavioral disturbance, psychotic disturbance, mood disturbance, and anxiety: F03.C0

## 2012-09-06 ENCOUNTER — Ambulatory Visit: Payer: Medicare PPO | Admitting: Internal Medicine

## 2012-11-29 ENCOUNTER — Other Ambulatory Visit: Payer: Self-pay | Admitting: Internal Medicine

## 2012-12-27 ENCOUNTER — Telehealth (HOSPITAL_COMMUNITY): Payer: Self-pay | Admitting: *Deleted

## 2012-12-27 NOTE — Telephone Encounter (Signed)
Telephone call placed to patient, no answer.  Letter sent.    Cathie Olden RN

## 2013-01-11 ENCOUNTER — Encounter: Payer: Self-pay | Admitting: Neurology

## 2013-01-11 DIAGNOSIS — G63 Polyneuropathy in diseases classified elsewhere: Secondary | ICD-10-CM | POA: Insufficient documentation

## 2013-01-11 DIAGNOSIS — J449 Chronic obstructive pulmonary disease, unspecified: Secondary | ICD-10-CM | POA: Insufficient documentation

## 2013-01-13 ENCOUNTER — Ambulatory Visit (INDEPENDENT_AMBULATORY_CARE_PROVIDER_SITE_OTHER): Payer: Medicare PPO | Admitting: Neurology

## 2013-01-13 ENCOUNTER — Encounter: Payer: Self-pay | Admitting: Neurology

## 2013-01-13 VITALS — BP 120/77 | HR 78 | Temp 97.7°F | Ht 66.0 in | Wt 153.0 lb

## 2013-01-13 DIAGNOSIS — R413 Other amnesia: Secondary | ICD-10-CM

## 2013-01-13 DIAGNOSIS — F02818 Dementia in other diseases classified elsewhere, unspecified severity, with other behavioral disturbance: Secondary | ICD-10-CM | POA: Insufficient documentation

## 2013-01-13 DIAGNOSIS — F0281 Dementia in other diseases classified elsewhere with behavioral disturbance: Secondary | ICD-10-CM | POA: Insufficient documentation

## 2013-01-13 NOTE — Progress Notes (Signed)
History of present illness: [name] is a 79 year old right-handed African American male, accompanied by his sister, referred by his primary care for evaluation of gradual onset memory trouble.  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  He has lived alone for more than 50 years, still independent, driving without difficulty at a familiar route, but he has to be careful to a new place, he still pays his bills, but has to be careful, he checks multiple times to make sure he does not make mistakes   He denies gait difficulty, no significant pain, he denies no sleep difficulty, denied mood disorder,  He has no family history of dementia, his sister, who accompanied him today, denies significant difficulty in patients, thought he was too self conscientious  Most recent laboratory evaluation  In April 2014, showed normal. TSh, CBC,  Vit D, CMP,   There was no family history of dementia  Review of Systems  Out of a complete 14 system review, the patient complains of only the following symptoms, and all other reviewed systems are negative.   Constitutional:   fatigue Cardiovascular:  N/A Ear/Nose/Throat:  N/A Skin: N/A Eyes: N/A Respiratory:  Shortness of breath, cough Gastroitestinal: N/A    Hematology/Lymphatic:  N/A Endocrine:  N/A Musculoskeletal:N/A Allergy/Immunology: Allergies, runny nose Neurological: Memory loss, weakness Psychiatric:    Decreased energy, change in appetite  PHYSICAL EXAMINATOINS:  Generalized: In no acute distress  Neck: Supple, no carotid bruits   Cardiac: Regular  rate rhythm  Pulmonary: Clear to auscultation bilaterally  Musculoskeletal: No deformity  Neurological examination  Mentation: Alert following command, MMSE 24/30, he is not oriented to season, clinic, office name, he has difficulty spell world backwards, animal naming was 10,  Cranial nerve II-XII: Pupils were equal round reactive to light extraocular movements were full, visual field were full on confrontational test. facial sensation and strength were normal. hearing was intact to finger rubbing bilaterally. Uvula tongue midline.  head turning and shoulder shrug and were normal and symmetric.Tongue protrusion into cheek strength was normal.  Motor: normal tone, bulk and strength.  Sensory: Intact to fine touch, pinprick, preserved vibratory sensation, and proprioception at toes.  Coordination: Normal finger to nose, heel-to-shin bilaterally there was no truncal ataxia  Gait: Rising up from seated position without assistance, mild leaning forward, mild difficulty with tandem walking,  Romberg signs: Negative  Deep tendon reflexes: Brachioradialis 2/2, biceps 2/2, triceps 2/2, patellar 2/2, Achilles 1/1, plantar responses were flexor bilaterally.  Assessment and plan: 79 year old [place] American male, with 2-3 years history of right onset memory trouble, Mini-Mental Status Examination is 24-30 today, there was no family history of dementia,   1.  most likely central nervous system degenerative disorder, 2. MRI of brain, laboratory evaluation including vitamin B12. 3. also offer information on LZAX trial. 4. RTC in one month

## 2013-01-14 LAB — ANA: Anti Nuclear Antibody(ANA): POSITIVE — AB

## 2013-01-14 LAB — RPR: RPR: NONREACTIVE

## 2013-01-19 ENCOUNTER — Telehealth: Payer: Self-pay

## 2013-01-19 MED ORDER — LOVASTATIN 40 MG PO TABS: 40.0000 mg | ORAL_TABLET | Freq: Every day | ORAL | Status: DC

## 2013-01-19 NOTE — Progress Notes (Signed)
Quick Note:  Please call patient, elevated CRP, otherwise normal lab. We will repeat Lab evaluation on his next follow up visit. ______

## 2013-01-19 NOTE — Telephone Encounter (Signed)
refill 

## 2013-01-20 NOTE — Progress Notes (Signed)
Quick Note:  I spoke to patient and relayed lab results, per Dr. Terrace Arabia. He was a little slow in understanding and asked that I call his sisters Denton Meek and Kizzie Furnish, which I did and relayed information. ______

## 2013-02-03 ENCOUNTER — Ambulatory Visit
Admission: RE | Admit: 2013-02-03 | Discharge: 2013-02-03 | Disposition: A | Discharge status: Home / Self Care | Payer: Medicare PPO | Source: Ambulatory Visit | Attending: Neurology | Admitting: Neurology

## 2013-02-03 DIAGNOSIS — R413 Other amnesia: Secondary | ICD-10-CM

## 2013-02-07 NOTE — Progress Notes (Signed)
Quick Note:  Spoke with patient and relayed the results of their MRI. The patient's sister Thurston Hole Night) was reminded of Mr Artley future appointment. The patient and sister understood and had no questions.  ______

## 2013-02-07 NOTE — Progress Notes (Signed)
Quick Note:  Please call patient, age related changes on MRI, small vessel disease, no acute lesions, no change in treatment plan ______

## 2013-02-17 ENCOUNTER — Encounter: Payer: Self-pay | Admitting: Neurology

## 2013-02-17 ENCOUNTER — Ambulatory Visit (INDEPENDENT_AMBULATORY_CARE_PROVIDER_SITE_OTHER): Payer: Medicare PPO | Admitting: Neurology

## 2013-02-17 VITALS — BP 101/70 | HR 78 | Ht 65.0 in | Wt 152.0 lb

## 2013-02-17 DIAGNOSIS — E785 Hyperlipidemia, unspecified: Secondary | ICD-10-CM

## 2013-02-17 DIAGNOSIS — R413 Other amnesia: Secondary | ICD-10-CM

## 2013-02-17 DIAGNOSIS — I10 Essential (primary) hypertension: Secondary | ICD-10-CM

## 2013-02-17 DIAGNOSIS — I2510 Atherosclerotic heart disease of native coronary artery without angina pectoris: Secondary | ICD-10-CM

## 2013-02-17 MED ORDER — DONEPEZIL HCL 10 MG PO TABS: 10.0000 mg | ORAL_TABLET | Freq: Every evening | ORAL | Status: DC | PRN

## 2013-02-17 NOTE — Progress Notes (Signed)
History of present illness: Matthew Gray is a 79 year old right-handed African American male, accompanied by his sister, referred by his primary care for evaluation of gradual onset memory trouble.  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  He has lived alone for more than 50 years, still independent, driving without difficulty at a familiar route, but he has to be careful to a new place, he still pays his bills, but has to be careful, he checks multiple times to make sure he does not make mistakes   He denies gait difficulty, no significant pain, he denies no sleep difficulty, denied mood disorder,  He has no family history of dementia, his sister, who accompanied him today, denies significant difficulty in patients, thought he was too self conscientious  Most recent laboratory evaluation  In April 2014, showed normal. TSh, CBC,  Vit D, CMP,   There was no family history of dementia  UPDATE June 26th 2014: He complains of memory trouble. he was able to drive to our office without much difficulty,  Review of Systems  Out of a complete 14 system review, the patient complains of only the following symptoms, and all other reviewed systems are negative.   Constitutional:   fatigue Cardiovascular:  N/A Ear/Nose/Throat:  N/A Skin: N/A Eyes: N/A Respiratory:  Shortness of breath, cough Gastroitestinal: N/A    Hematology/Lymphatic:  N/A Endocrine:  N/A Musculoskeletal:N/A Allergy/Immunology: Allergies, runny nose Neurological: Memory loss, weakness sleepiness, Psychiatric:    Decreased energy, change in  appetite  PHYSICAL EXAMINATOINS:  Generalized: In no acute distress  Neck: Supple, no carotid bruits   Cardiac: Regular rate rhythm  Pulmonary: Clear to auscultation bilaterally  Musculoskeletal: No deformity  Neurological examination  Mentation: Alert following command, MMSE 24/30, he is not oriented to season, clinic, office name, he has difficulty spell world backwards, animal naming was 10,  Cranial nerve II-XII: Pupils were equal round reactive to light extraocular movements were full, visual field were full on confrontational test. facial sensation and strength were normal. hearing was intact to finger rubbing bilaterally. Uvula tongue midline.  head turning and shoulder shrug and were normal and symmetric.Tongue protrusion into cheek strength was normal.  Motor: normal tone, bulk and strength.  Sensory: Intact to fine touch, pinprick, preserved vibratory sensation, and proprioception at toes.  Coordination: Normal finger to nose, heel-to-shin bilaterally there was no truncal ataxia  Gait: Rising up from seated position without assistance, mild leaning forward, mild difficulty with tandem walking,  Romberg signs: Negative  Deep tendon reflexes: Brachioradialis 2/2, biceps 2/2, triceps 2/2, patellar 2/2, Achilles 1/1, plantar responses were flexor bilaterally.  Assessment and plan: 79 year old [place] American male, with 2-3 years history of right onset memory trouble, Mini-Mental Status Examination is 25/30 today, there was no family history of dementia,   1.  most likely central nervous system degenerative disorder,  2. Moderate  Exercise, starting Aricept 3. also offer information on LZAX trial. 4. RTC in 4-6 month

## 2013-02-25 ENCOUNTER — Emergency Department (HOSPITAL_COMMUNITY)
Admission: EM | Admit: 2013-02-25 | Discharge: 2013-02-25 | Disposition: A | Discharge status: Home / Self Care | Payer: Medicare PPO | Attending: Emergency Medicine | Admitting: Emergency Medicine

## 2013-02-25 ENCOUNTER — Encounter (HOSPITAL_COMMUNITY): Payer: Self-pay | Admitting: Emergency Medicine

## 2013-02-25 DIAGNOSIS — Z880 Allergy status to penicillin: Secondary | ICD-10-CM | POA: Insufficient documentation

## 2013-02-25 DIAGNOSIS — J441 Chronic obstructive pulmonary disease with (acute) exacerbation: Secondary | ICD-10-CM | POA: Insufficient documentation

## 2013-02-25 DIAGNOSIS — E559 Vitamin D deficiency, unspecified: Secondary | ICD-10-CM | POA: Insufficient documentation

## 2013-02-25 DIAGNOSIS — Z8669 Personal history of other diseases of the nervous system and sense organs: Secondary | ICD-10-CM | POA: Insufficient documentation

## 2013-02-25 DIAGNOSIS — R109 Unspecified abdominal pain: Secondary | ICD-10-CM

## 2013-02-25 DIAGNOSIS — Z79899 Other long term (current) drug therapy: Secondary | ICD-10-CM | POA: Insufficient documentation

## 2013-02-25 DIAGNOSIS — I10 Essential (primary) hypertension: Secondary | ICD-10-CM | POA: Insufficient documentation

## 2013-02-25 DIAGNOSIS — Z860100 Personal history of colon polyps, unspecified: Secondary | ICD-10-CM | POA: Insufficient documentation

## 2013-02-25 DIAGNOSIS — I2510 Atherosclerotic heart disease of native coronary artery without angina pectoris: Secondary | ICD-10-CM | POA: Insufficient documentation

## 2013-02-25 DIAGNOSIS — Z87448 Personal history of other diseases of urinary system: Secondary | ICD-10-CM | POA: Insufficient documentation

## 2013-02-25 DIAGNOSIS — Z8619 Personal history of other infectious and parasitic diseases: Secondary | ICD-10-CM | POA: Insufficient documentation

## 2013-02-25 DIAGNOSIS — Z8739 Personal history of other diseases of the musculoskeletal system and connective tissue: Secondary | ICD-10-CM | POA: Insufficient documentation

## 2013-02-25 DIAGNOSIS — E785 Hyperlipidemia, unspecified: Secondary | ICD-10-CM | POA: Insufficient documentation

## 2013-02-25 DIAGNOSIS — Z8679 Personal history of other diseases of the circulatory system: Secondary | ICD-10-CM | POA: Insufficient documentation

## 2013-02-25 DIAGNOSIS — J45901 Unspecified asthma with (acute) exacerbation: Secondary | ICD-10-CM | POA: Insufficient documentation

## 2013-02-25 DIAGNOSIS — Z8659 Personal history of other mental and behavioral disorders: Secondary | ICD-10-CM | POA: Insufficient documentation

## 2013-02-25 LAB — COMPREHENSIVE METABOLIC PANEL
ALT: 10 U/L (ref 0–53)
Calcium: 9 mg/dL (ref 8.4–10.5)
Creatinine, Ser: 1.27 mg/dL (ref 0.50–1.35)
GFR calc Af Amer: 62 mL/min — ABNORMAL LOW (ref >90)
Glucose, Bld: 87 mg/dL (ref 70–99)
Sodium: 139 mEq/L (ref 135–145)
Total Protein: 7.3 g/dL (ref 6.0–8.3)

## 2013-02-25 LAB — URINALYSIS, ROUTINE W REFLEX MICROSCOPIC
Bilirubin Urine: NEGATIVE
Glucose, UA: NEGATIVE mg/dL
Hgb urine dipstick: NEGATIVE
Ketones, ur: NEGATIVE mg/dL
Leukocytes, UA: NEGATIVE
Nitrite: NEGATIVE
Protein, ur: NEGATIVE mg/dL
Specific Gravity, Urine: 1.018 (ref 1.005–1.030)
Urobilinogen, UA: 1 mg/dL (ref 0.0–1.0)
pH: 6.5 (ref 5.0–8.0)

## 2013-02-25 LAB — CBC WITH DIFFERENTIAL/PLATELET
Basophils Absolute: 0 10*3/uL (ref 0.0–0.1)
Basophils Relative: 0 % (ref 0–1)
Eosinophils Absolute: 0.1 K/uL (ref 0.0–0.7)
Eosinophils Relative: 2 % (ref 0–5)
HCT: 40.7 % (ref 39.0–52.0)
Hemoglobin: 13.3 g/dL (ref 13.0–17.0)
Lymphocytes Relative: 16 % (ref 12–46)
Lymphs Abs: 1.1 10*3/uL (ref 0.7–4.0)
MCH: 26.4 pg (ref 26.0–34.0)
MCHC: 32.7 g/dL (ref 30.0–36.0)
MCV: 80.8 fL (ref 78.0–100.0)
Monocytes Absolute: 0.3 10*3/uL (ref 0.1–1.0)
Monocytes Relative: 5 % (ref 3–12)
Neutro Abs: 5.4 10*3/uL (ref 1.7–7.7)
Neutrophils Relative %: 77 % (ref 43–77)
Platelets: 146 10*3/uL — ABNORMAL LOW (ref 150–400)
RBC: 5.04 MIL/uL (ref 4.22–5.81)
RDW: 14.9 % (ref 11.5–15.5)
WBC: 7 10*3/uL (ref 4.0–10.5)

## 2013-02-25 LAB — COMPREHENSIVE METABOLIC PANEL WITH GFR
AST: 16 U/L (ref 0–37)
Albumin: 3.6 g/dL (ref 3.5–5.2)
Alkaline Phosphatase: 60 U/L (ref 39–117)
BUN: 22 mg/dL (ref 6–23)
CO2: 30 meq/L (ref 19–32)
Chloride: 99 meq/L (ref 96–112)
GFR calc non Af Amer: 53 mL/min — ABNORMAL LOW (ref >90)
Potassium: 3.5 meq/L (ref 3.5–5.1)
Total Bilirubin: 0.6 mg/dL (ref 0.3–1.2)

## 2013-02-25 LAB — POCT I-STAT TROPONIN I: Troponin i, poc: 0.01 ng/mL (ref 0.00–0.08)

## 2013-02-25 LAB — LIPASE, BLOOD: Lipase: 42 U/L (ref 11–59)

## 2013-02-25 MED ORDER — SODIUM CHLORIDE 0.9 % IV BOLUS (SEPSIS)
500.0000 mL | Freq: Once | INTRAVENOUS | Status: AC
  Administered 2013-02-25: 500 mL via INTRAVENOUS

## 2013-02-25 NOTE — ED Notes (Signed)
Pt arrives to ed co abd pain onset yesterday.  Pt denies n/v/d. Pt denies recent illness, injury.  Caox4, nad.

## 2013-02-25 NOTE — Discharge Instructions (Signed)
Abdominal Pain Abdominal pain can be caused by many things. Your caregiver decides the seriousness of your pain by an examination and possibly blood tests and X-rays. Many cases can be observed and treated at home. Most abdominal pain is not caused by a disease and will probably improve without treatment. However, in many cases, more time must pass before a clear cause of the pain can be found. Before that point, it may not be known if you need more testing, or if hospitalization or surgery is needed. HOME CARE INSTRUCTIONS   Do not take laxatives unless directed by your caregiver.  Take pain medicine only as directed by your caregiver.  Only take over-the-counter or prescription medicines for pain, discomfort, or fever as directed by your caregiver.  Try a clear liquid diet (broth, tea, or water) for as long as directed by your caregiver. Slowly move to a bland diet as tolerated. SEEK IMMEDIATE MEDICAL CARE IF:   The pain does not go away.  You have a fever.  You keep throwing up (vomiting).  The pain is felt only in portions of the abdomen. Pain in the right side could possibly be appendicitis. In an adult, pain in the left lower portion of the abdomen could be colitis or diverticulitis.  You pass bloody or black tarry stools. MAKE SURE YOU:   Understand these instructions.  Will watch your condition.  Will get help right away if you are not doing well or get worse. Document Released: 05/21/2005 Document Revised: 11/03/2011 Document Reviewed: 03/29/2008 Exit[employer] Patient Information 2014 [employer].  

## 2013-02-25 NOTE — ED Notes (Signed)
Lunch Tray ordered 

## 2013-02-25 NOTE — ED Provider Notes (Signed)
History    CSN: [id number] Arrival date & time 02/25/13  0707  First MD Initiated Contact with Patient 02/25/13 0710     Chief Complaint  Patient presents with  . Abdominal Pain   (Consider location/radiation/quality/duration/timing/severity/associated sxs/prior Treatment) HPI Comments: No sick contacts.  Lives alone.  Reports pain was presnet very mildly before eating dinner, got worse sometime afterwards.  Pain intensified greatly about 5 AM and decided to come to the ED.  Just as he arrived, pain is resolved.  Did not think he passed gas or anything to relieve pain.  Didn't take any meds PTA.  Pt denies prior h/o any surgeries.  Began new medication Aricept that he has been taking at bedtime.    Patient is a 79 y.o. male presenting with abdominal pain. The history is provided by the patient and medical records.  Abdominal Pain This is a new problem. The current episode started 12 to 24 hours ago (Shortly after eating [employer]). The problem occurs constantly. The problem has been resolved. Associated symptoms include abdominal pain. Pertinent negatives include no chest pain and no shortness of breath. Associated symptoms comments: No N/V/D, chills, sweats, mylagias.  No fever that he is aware of.. Nothing relieves the symptoms. He has tried nothing for the symptoms. The treatment provided significant relief.   Past Medical History  Diagnosis Date  . 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 VITAMIN D DEFICIENCY 06/04/2010  . HYPERLIPIDEMIA 04/03/2007  . DEPRESSION 11/30/2009  . HYPERTENSION 04/03/2007  . CORONARY ARTERY DISEASE 10/27/2007  . PERIPHERAL VASCULAR DISEASE 10/27/2007  . SINUSITIS- ACUTE-NOS 10/08/2009  . ALLERGIC RHINITIS 10/27/2007  . CHRONIC OBSTRUCTIVE PULMONARY DISEASE, ACUTE EXACERBATION 11/22/2007  . ASTHMA 10/27/2007  . COPD 10/27/2007  . BENIGN PROSTATIC HYPERTROPHY 10/27/2007  . SHOULDER PAIN, LEFT 06/21/2009  . ANKLE PAIN, RIGHT 11/30/2009  . SCIATICA, LEFT 11/08/2009  . BACK  PAIN 10/08/2009  . MYALGIA 02/04/2008  . Pain in Soft Tissues of Limb 02/04/2008  . FATIGUE 11/30/2009  . RASH-NONVESICULAR 10/27/2007  . PERIPHERAL EDEMA 11/16/2009  . Hemoptysis 04/24/2009  . COLONIC POLYPS, HX OF 10/27/2007  . PARESTHESIA 09/02/2010  . Peripheral neuropathy 03/08/2012  . Obstructive chronic bronchitis without exacerbation    Past Surgical History  Procedure Laterality Date  . None     Family History  Problem Relation Age of Onset  . Hypertension Sister     3 sisters  . Anuerysm Brother     died with  . Stroke Other   . Cancer Other     lung and breast  . Alcohol abuse Other   . Cancer Mother     BREAST  . Cancer Father     LUNG   History  Substance Use Topics  . Smoking status: Never Smoker   . Smokeless tobacco: Never Used     Comment: Quit ten years ago.  . Alcohol Use: No    Review of Systems  Constitutional: Negative for fever, chills and diaphoresis.  Respiratory: Negative for chest tightness and shortness of breath.   Cardiovascular: Negative for chest pain.  Gastrointestinal: Positive for abdominal pain. Negative for nausea, vomiting, diarrhea and constipation.  Genitourinary: Negative for dysuria, flank pain and difficulty urinating.  Musculoskeletal: Negative for myalgias and back pain.  Skin: Negative for rash.  All other systems reviewed and are negative.    Allergies  Ace inhibitors; Atorvastatin; Penicillins; and Simvastatin  Home Medications   Current Outpatient Rx  Name  Route  Sig  Dispense  Refill  .  Cholecalciferol (VITAMIN D3) 2000 UNITS capsule   Oral   Take 2,000 Units by mouth daily.          Marland Kitchen donepezil (ARICEPT) 10 MG tablet   Oral   Take 1 tablet (10 mg total) by mouth at bedtime as needed.   30 tablet   12   . fexofenadine (ALLEGRA) 180 MG tablet   Oral   Take 180 mg by mouth daily. For allergies          . lovastatin (MEVACOR) 40 MG tablet   Oral   Take 1 tablet (40 mg total) by mouth daily.   30 tablet    3   . valsartan-hydrochlorothiazide (DIOVAN-HCT) 320-25 MG per tablet      TAKE 1 TABLET BY MOUTH DAILY   90 tablet   1    BP 132/81  Pulse 69  Temp(Src) 97.6 F (36.4 C) (Oral)  Resp 17  SpO2 95% Physical Exam  Nursing note and vitals reviewed. Constitutional: He is oriented to person, place, and time. He appears well-developed and well-nourished. No distress.  HENT:  Head: Normocephalic and atraumatic.  Mouth/Throat: Oropharynx is clear and moist.  Eyes: EOM are normal. Pupils are equal, round, and reactive to light.  Neck: Normal range of motion. Neck supple.  Cardiovascular: Normal rate, regular rhythm and intact distal pulses.   No murmur heard. Pulmonary/Chest: Effort normal. No respiratory distress. He has no wheezes. He has no rales.  Abdominal: Soft. Normal appearance and bowel sounds are normal. He exhibits no distension. There is no tenderness. There is no rebound, no guarding, no CVA tenderness, no tenderness at McBurney's point and negative Murphy's sign.  Musculoskeletal: He exhibits no edema.  Neurological: He is alert and oriented to person, place, and time.  Skin: Skin is warm and dry. No rash noted. He is not diaphoretic.  Psychiatric: He has a normal mood and affect.    ED Course  Procedures (including critical care time) Labs Reviewed  CBC WITH DIFFERENTIAL - Abnormal; Notable for the following:    Platelets 146 (*)    All other components within normal limits  COMPREHENSIVE METABOLIC PANEL - Abnormal; Notable for the following:    GFR calc non Af Amer 53 (*)    GFR calc Af Amer 62 (*)    All other components within normal limits  LIPASE, BLOOD  URINALYSIS, ROUTINE W REFLEX MICROSCOPIC  POCT I-STAT TROPONIN I   No results found. 1. Abdominal pain      RA sat is 98% and I interpret to be normal  8:42 AM Pt denies any further symptoms, while on monitor, noted irregular rhythm, found to be in bigeminy.  Pt denies pain, SOB, dizziness.     ECG  at time 0834 shows sinus rhythm with freq PVCs in bigeminy rhythm.  Non specific intraventricular conduction delay.  Abn ECG without ischemic changes.  Prior ECG from January 2013 shows LVH with some widening of QRS.  Freq PVC's are new.  1:00 PM Pt ate, no further abd pain throughout stay in the ED.  Labs are ok.  Can follow up with PCP next week, which I have recommended return if worse in any way.    MDM  I reviewed recent prior records.  Was prescribed Aricept by Dr. Terrace Arabia due to memory problems recently.  Lives alone, has family support. Pain got more cosntant and more severe in early AM, but resolved just prior to arrival.  No associated ACS symptoms, no fever,  no N/V/D.  Exam benign now.  Will get screening labs including lipase, LFT's, UA, troponin and observe in the ED for now.  Defer imaging for now since pain completely resolved.    Gavin Pound. Kalaysia Demonbreun, MD 02/25/13 1301

## 2013-03-01 ENCOUNTER — Ambulatory Visit (INDEPENDENT_AMBULATORY_CARE_PROVIDER_SITE_OTHER): Payer: Medicare PPO | Admitting: Internal Medicine

## 2013-03-01 ENCOUNTER — Encounter: Payer: Self-pay | Admitting: Internal Medicine

## 2013-03-01 VITALS — BP 102/72 | HR 80 | Temp 97.1°F | Wt 157.0 lb

## 2013-03-01 DIAGNOSIS — J449 Chronic obstructive pulmonary disease, unspecified: Secondary | ICD-10-CM

## 2013-03-01 DIAGNOSIS — R413 Other amnesia: Secondary | ICD-10-CM

## 2013-03-01 DIAGNOSIS — R109 Unspecified abdominal pain: Secondary | ICD-10-CM

## 2013-03-01 DIAGNOSIS — I10 Essential (primary) hypertension: Secondary | ICD-10-CM

## 2013-03-01 MED ORDER — FEXOFENADINE HCL 180 MG PO TABS: 180.0000 mg | ORAL_TABLET | Freq: Every day | ORAL | Status: DC

## 2013-03-01 MED ORDER — VALSARTAN-HYDROCHLOROTHIAZIDE 160-12.5 MG PO TABS: 1.0000 | ORAL_TABLET | Freq: Every day | ORAL | Status: DC

## 2013-03-01 MED ORDER — DONEPEZIL HCL 5 MG PO TABS: 5.0000 mg | ORAL_TABLET | Freq: Every day | ORAL | Status: DC

## 2013-03-01 MED ORDER — OMEPRAZOLE 20 MG PO CPDR: 20.0000 mg | DELAYED_RELEASE_CAPSULE | Freq: Every day | ORAL | Status: DC

## 2013-03-01 MED ORDER — LOVASTATIN 40 MG PO TABS: 40.0000 mg | ORAL_TABLET | Freq: Every day | ORAL | Status: DC

## 2013-03-01 NOTE — Patient Instructions (Signed)
OK to stop the Aricept 10 mg, and the Diovan HCT 320/25 mg for now Please take all new medication as prescribed - the aricept 5 mg per day, and the Diovan HCT 160/12.5 mg per day Please take all new medication as prescribed - the Prilosec 20 mg per day You can also use OTC Capsaicin for finger arthritic pain Please continue all other medications as before, and refills have been done if requested. Please keep your appointments with your specialists as you have planned No further labs today  Please remember to sign up for My Chart if you have not done so, as this will be important to you in the future with finding out test results, communicating by private email, and scheduling acute appointments online when needed.  Please return in 1 months, or sooner if needed

## 2013-03-01 NOTE — Progress Notes (Signed)
Subjective:    Patient ID: Matthew Gray, male    DOB: 10-Jun-1934, 79 y.o.   MRN: [id number]  HPI Pt here with family, hx limited due to pt dementia, I think trying to c/o abd discomfort, sharp fleeting mild pains but keep happening for approx 1wk, assoc with occas loose stools, but no worsening reflux, other abd pain, dysphagia, n/v, other bowel change or blood. Appetite ok, no overt wt loss or radiation of pain.  Did have start aricept at 10 mg qhs recently, not on PPI.  C/o general weakness but Pt denies chest pain, increased sob or doe, wheezing, orthopnea, PND, increased LE swelling, palpitations, dizziness or syncope. Past Medical History  Diagnosis Date  . 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 VITAMIN D DEFICIENCY 06/04/2010  . HYPERLIPIDEMIA 04/03/2007  . DEPRESSION 11/30/2009  . HYPERTENSION 04/03/2007  . CORONARY ARTERY DISEASE 10/27/2007  . PERIPHERAL VASCULAR DISEASE 10/27/2007  . SINUSITIS- ACUTE-NOS 10/08/2009  . ALLERGIC RHINITIS 10/27/2007  . CHRONIC OBSTRUCTIVE PULMONARY DISEASE, ACUTE EXACERBATION 11/22/2007  . ASTHMA 10/27/2007  . COPD 10/27/2007  . BENIGN PROSTATIC HYPERTROPHY 10/27/2007  . SHOULDER PAIN, LEFT 06/21/2009  . ANKLE PAIN, RIGHT 11/30/2009  . SCIATICA, LEFT 11/08/2009  . BACK PAIN 10/08/2009  . MYALGIA 02/04/2008  . Pain in Soft Tissues of Limb 02/04/2008  . FATIGUE 11/30/2009  . RASH-NONVESICULAR 10/27/2007  . PERIPHERAL EDEMA 11/16/2009  . Hemoptysis 04/24/2009  . COLONIC POLYPS, HX OF 10/27/2007  . PARESTHESIA 09/02/2010  . Peripheral neuropathy 03/08/2012  . Obstructive chronic bronchitis without exacerbation    Past Surgical History  Procedure Laterality Date  . None      reports that he has never smoked. He has never used smokeless tobacco. He reports that he does not drink alcohol or use illicit drugs. family history includes Alcohol abuse in his other; Anuerysm in his brother; Cancer in his father, mother, and other; Hypertension in his sister; and Stroke in his  other. Allergies  Allergen Reactions  . Ace Inhibitors Other (See Comments)    unknown  . Atorvastatin     REACTION: myalgias  . Penicillins Other (See Comments)    unknown  . Simvastatin     REACTION: myalgias   Current Outpatient Prescriptions on File Prior to Visit  Medication Sig Dispense Refill  . Cholecalciferol (VITAMIN D3) 2000 UNITS capsule Take 2,000 Units by mouth daily.        No current facility-administered medications on file prior to visit.   Review of Systems  Constitutional: Negative for unexpected weight change, or unusual diaphoresis  HENT: Negative for tinnitus.   Eyes: Negative for photophobia and visual disturbance.  Respiratory: Negative for choking and stridor.   Gastrointestinal: Negative for vomiting and blood in stool.  Genitourinary: Negative for hematuria and decreased urine volume.  Musculoskeletal: Negative for acute joint swelling Skin: Negative for color change and wound.  Neurological: Negative for tremors and numbness other than noted  Psychiatric/Behavioral: Negative for decreased concentration or  hyperactivity.       Objective:   Physical Exam BP 102/72  Pulse 80  Temp(Src) 97.1 F (36.2 C) (Oral)  Wt 157 lb (71.215 kg)  BMI 26.13 kg/m2  SpO2 92% VS noted,  Constitutional: Pt appears well-developed and well-nourished.  HENT: Head: NCAT.  Right Ear: External ear normal.  Left Ear: External ear normal.  Eyes: Conjunctivae and EOM are normal. Pupils are equal, round, and reactive to light.  Neck: Normal range of motion. Neck supple.  Cardiovascular: Normal rate and regular  rhythm.   Pulmonary/Chest: Effort normal and breath sounds normal.  Abd:  Soft, mild epigastric tender, non-distended, + BS, no guarding or rebound Neurological: Pt is alert.At basline confused per family Skin: Skin is warm. No erythema.  Psychiatric: Pt behavior is normal, not depressed affect.     Assessment & Plan:

## 2013-03-02 DIAGNOSIS — R109 Unspecified abdominal pain: Secondary | ICD-10-CM | POA: Insufficient documentation

## 2013-03-02 NOTE — Assessment & Plan Note (Signed)
For change aricept as above,  to f/u any worsening symptoms or concerns

## 2013-03-02 NOTE — Assessment & Plan Note (Signed)
stable overall by history and exam, recent data reviewed with pt, and pt to continue medical treatment as before,  to f/u any worsening symptoms or concerns SpO2 Readings from Last 3 Encounters:  03/01/13 92%  02/25/13 98%  03/08/12 99%

## 2013-03-02 NOTE — Assessment & Plan Note (Signed)
?   overcontrolled and related to his weakness, ok for decreased diovanHCT 160/12.5 qd,  to f/u any worsening symptoms or concerns, cont to monitor BP at home

## 2013-03-02 NOTE — Addendum Note (Signed)
Addended by: Corwin Levins on: 03/02/2013 01:02 PM   Modules accepted: Level of Service

## 2013-03-02 NOTE — Assessment & Plan Note (Addendum)
Hx difficult, but suspect may be related to recent high dose aricept start, +/- gastritis vs other;  Exam with minimal findings, will hold on labs/CT/referral for now, for trial PPI and decrease aricept to 5 qd for one month, then back to 10 mg after that with neuro f/u as planned  Note:  Total time for pt hx, exam, review of record with pt in the room, determination of diagnoses and plan for further eval and tx is > 40 min, with over 50% spent in coordination and counseling of patient and family

## 2013-03-04 ENCOUNTER — Ambulatory Visit (INDEPENDENT_AMBULATORY_CARE_PROVIDER_SITE_OTHER): Payer: Medicare PPO | Admitting: Internal Medicine

## 2013-03-04 ENCOUNTER — Encounter: Payer: Self-pay | Admitting: Internal Medicine

## 2013-03-04 VITALS — BP 114/84 | HR 77 | Temp 97.9°F | Ht 67.0 in | Wt 157.1 lb

## 2013-03-04 DIAGNOSIS — J4489 Other specified chronic obstructive pulmonary disease: Secondary | ICD-10-CM

## 2013-03-04 DIAGNOSIS — J449 Chronic obstructive pulmonary disease, unspecified: Secondary | ICD-10-CM

## 2013-03-04 DIAGNOSIS — R109 Unspecified abdominal pain: Secondary | ICD-10-CM

## 2013-03-04 DIAGNOSIS — K4090 Unilateral inguinal hernia, without obstruction or gangrene, not specified as recurrent: Secondary | ICD-10-CM

## 2013-03-04 NOTE — Patient Instructions (Signed)
Please continue all other medications as before, and refills have been done if requested. You will be contacted regarding the referral for: General Surgury  Please remember to sign up for My Chart if you have not done so, as this will be important to you in the future with finding out test results, communicating by private email, and scheduling acute appointments online when needed.

## 2013-03-04 NOTE — Assessment & Plan Note (Signed)
Mild, reducible, o/w stable, pt and sister with him agreeable to surgury  - to refer

## 2013-03-04 NOTE — Assessment & Plan Note (Signed)
stable overall by history and exam, recent data reviewed with pt, and pt to continue medical treatment as before,  to f/u any worsening symptoms or concerns SpO2 Readings from Last 3 Encounters:  03/04/13 99%  03/01/13 92%  02/25/13 98%

## 2013-03-04 NOTE — Progress Notes (Signed)
Subjective:    Patient ID: Matthew Gray, male    DOB: 06-24-1934, 79 y.o.   MRN: [id number]  HPI Here to f/u, forgot to mention last visit has worsening 1 mo swelling to right groin area, mild tender at times, comes and goes - not sure if positional,  Pt denies fever, wt loss, night sweats, loss of appetite, or other constitutional symptoms  Denies worsening reflux, abd pain, dysphagia, n/v, bowel change or blood - abd symptoms improved apparently with decreased aricept and PPI.  Pt denies chest pain, increased sob or doe, wheezing, orthopnea, PND, increased LE swelling, palpitations, dizziness or syncope. Past Medical History  Diagnosis Date  . 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 VITAMIN D DEFICIENCY 06/04/2010  . HYPERLIPIDEMIA 04/03/2007  . DEPRESSION 11/30/2009  . HYPERTENSION 04/03/2007  . CORONARY ARTERY DISEASE 10/27/2007  . PERIPHERAL VASCULAR DISEASE 10/27/2007  . SINUSITIS- ACUTE-NOS 10/08/2009  . ALLERGIC RHINITIS 10/27/2007  . CHRONIC OBSTRUCTIVE PULMONARY DISEASE, ACUTE EXACERBATION 11/22/2007  . ASTHMA 10/27/2007  . COPD 10/27/2007  . BENIGN PROSTATIC HYPERTROPHY 10/27/2007  . SHOULDER PAIN, LEFT 06/21/2009  . ANKLE PAIN, RIGHT 11/30/2009  . SCIATICA, LEFT 11/08/2009  . BACK PAIN 10/08/2009  . MYALGIA 02/04/2008  . Pain in Soft Tissues of Limb 02/04/2008  . FATIGUE 11/30/2009  . RASH-NONVESICULAR 10/27/2007  . PERIPHERAL EDEMA 11/16/2009  . Hemoptysis 04/24/2009  . COLONIC POLYPS, HX OF 10/27/2007  . PARESTHESIA 09/02/2010  . Peripheral neuropathy 03/08/2012  . Obstructive chronic bronchitis without exacerbation    Past Surgical History  Procedure Laterality Date  . None      reports that he has never smoked. He has never used smokeless tobacco. He reports that he does not drink alcohol or use illicit drugs. family history includes Alcohol abuse in his other; Anuerysm in his brother; Cancer in his father, mother, and other; Hypertension in his sister; and Stroke in his other. Allergies   Allergen Reactions  . Ace Inhibitors Other (See Comments)    unknown  . Atorvastatin     REACTION: myalgias  . Penicillins Other (See Comments)    unknown  . Simvastatin     REACTION: myalgias   Review of Systems . Constitutional: Negative for unexpected weight change, or unusual diaphoresis  HENT: Negative for tinnitus.   Eyes: Negative for photophobia and visual disturbance.  Respiratory: Negative for choking and stridor.   Gastrointestinal: Negative for vomiting and blood in stool.  Genitourinary: Negative for hematuria and decreased urine volume.  Musculoskeletal: Negative for acute joint swelling Skin: Negative for color change and wound.  Neurological: Negative for tremors and numbness other than noted  Psychiatric/Behavioral: Negative for decreased concentration or  hyperactivity.       Objective:   Physical Exam BP 114/84  Pulse 77  Temp(Src) 97.9 F (36.6 C) (Oral)  Ht 5\' 7"  (1.702 m)  Wt 157 lb 2 oz (71.271 kg)  BMI 24.6 kg/m2  SpO2 99% VS noted, not ill appearing Constitutional: Pt appears well-developed and well-nourished.  HENT: Head: NCAT.  Right Ear: External ear normal.  Left Ear: External ear normal.  Eyes: Conjunctivae and EOM are normal. Pupils are equal, round, and reactive to light.  Neck: Normal range of motion. Neck supple.  Cardiovascular: Normal rate and regular rhythm.   Pulmonary/Chest: Effort normal and breath sounds normal.  Abd:  Soft, NT, non-distended, + BS Right inguinal hernia noted, redducible, minor tender Neurological: Pt is alert. Not confused  Skin: Skin is warm. No erythema.  Psychiatric: Pt behavior  is normal. Thought content normal.     Assessment & Plan:

## 2013-03-04 NOTE — Assessment & Plan Note (Signed)
Improved, The current medical regimen is effective;  continue present plan and medications.  

## 2013-03-21 ENCOUNTER — Ambulatory Visit (INDEPENDENT_AMBULATORY_CARE_PROVIDER_SITE_OTHER): Payer: Medicare PPO | Admitting: General Surgery

## 2013-03-31 ENCOUNTER — Ambulatory Visit (INDEPENDENT_AMBULATORY_CARE_PROVIDER_SITE_OTHER): Payer: Medicare PPO | Admitting: General Surgery

## 2013-03-31 ENCOUNTER — Telehealth: Payer: Self-pay | Admitting: Internal Medicine

## 2013-03-31 DIAGNOSIS — K4090 Unilateral inguinal hernia, without obstruction or gangrene, not specified as recurrent: Secondary | ICD-10-CM

## 2013-03-31 NOTE — Telephone Encounter (Signed)
Repeat referral has been done to [facility]

## 2013-03-31 NOTE — Telephone Encounter (Signed)
Patient went for his appointment with [facility] and they told him they were out of network with his insurance so he would have to pay money out of pocket, they would like a referral to another surgeon that would be in network with his insurance

## 2013-04-01 ENCOUNTER — Ambulatory Visit: Payer: Medicare PPO | Admitting: Internal Medicine

## 2013-04-01 DIAGNOSIS — Z0289 Encounter for other administrative examinations: Secondary | ICD-10-CM

## 2013-06-16 ENCOUNTER — Encounter: Payer: Self-pay | Admitting: Neurology

## 2013-06-16 ENCOUNTER — Ambulatory Visit (INDEPENDENT_AMBULATORY_CARE_PROVIDER_SITE_OTHER): Payer: Medicare PPO | Admitting: Neurology

## 2013-06-16 VITALS — BP 130/82 | HR 60 | Ht 65.0 in | Wt 152.0 lb

## 2013-06-16 DIAGNOSIS — R413 Other amnesia: Secondary | ICD-10-CM

## 2013-06-16 MED ORDER — MEMANTINE HCL ER 7 & 14 & 21 &28 MG PO CP24: 7.0000 mg | ORAL_CAPSULE | Freq: Every day | ORAL | Status: DC

## 2013-06-16 MED ORDER — MEMANTINE HCL ER 28 MG PO CP24: 28.0000 mg | ORAL_CAPSULE | Freq: Every day | ORAL | Status: DC

## 2013-06-16 NOTE — Progress Notes (Signed)
History of present illness: [name] is a 79 year old right-handed African American male, accompanied by his sister, referred by his primary care for evaluation of gradual onset memory trouble.  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  He has lived alone for more than 50 years, still independent, driving without difficulty at a familiar route, but he has to be careful to a new place, he still pays his bills, but has to be careful, he checks multiple times to make sure he does not make mistakes   He denies gait difficulty, no significant pain, he denies no sleep difficulty, denied mood disorder,  He has no family history of dementia, his sister, who accompanied him today, denies significant difficulty in patients, thought he was too self conscientious  Most recent laboratory evaluation  In April 2014, showed normal. TSH, CBC,  Vit D, CMP,   There was no family history of dementia  UPDATE Oct 23 rd 2014: He is accompanied by his sister today.  He had genital hernia surgery in August 20th 2014. He stayed with his sister for 2 weeks afterwards. He has mild depression, now has improved. He has more difficulty concentrating.   Review of Systems  Out of a complete 14 system review, the patient complains of only the following symptoms, and all other reviewed systems are negative.  Cough, allergy, runny nose,  PHYSICAL EXAMINATOINS:  Generalized: In no acute distress  Neck: Supple, no carotid bruits   Cardiac: Regular rate rhythm  Pulmonary: Clear to auscultation bilaterally  Musculoskeletal: No  deformity  Neurological examination  Mentation: Alert following command, MMSE 21/30, he is not oriented to season, clinic, office name, he missed 3 out of 3 recalls, he has difficulty spell world backwards, animal naming was 10,  Cranial nerve II-XII: Pupils were equal round reactive to light extraocular movements were full, visual field were full on confrontational test. facial sensation and strength were normal. hearing was intact to finger rubbing bilaterally. Uvula tongue midline.  head turning and shoulder shrug and were normal and symmetric.Tongue protrusion into cheek strength was normal.  Motor: normal tone, bulk and strength.  Sensory: Intact to fine touch, pinprick, preserved vibratory sensation, and proprioception at toes.  Coordination: Normal finger to nose, heel-to-shin bilaterally there was no truncal ataxia  Gait: Rising up from seated position without assistance, mild leaning forward, mild difficulty with tandem walking,  Romberg signs: Negative  Deep tendon reflexes: Brachioradialis 2/2, biceps 2/2, triceps 2/2, patellar 2/2, Achilles 1/1, plantar responses were flexor bilaterally.  Assessment and plan: 79 year old [place] American male, with 2-3 years history of gradual onset memory trouble, Mini-Mental Status Examination is 21/30 today, there was no family history of dementia,   1.  Most likely central nervous system degenerative disorder,  2. Moderate  Exercise, Keep Aricept 3.  Add on Namenda titrating to 28 mg every day. 4.  RTC in 6 months, He does not want to consider LZAX trial at this time.

## 2013-07-28 ENCOUNTER — Encounter (INDEPENDENT_AMBULATORY_CARE_PROVIDER_SITE_OTHER): Payer: Medicare PPO | Admitting: Ophthalmology

## 2013-08-01 ENCOUNTER — Encounter (INDEPENDENT_AMBULATORY_CARE_PROVIDER_SITE_OTHER): Payer: Medicare PPO | Admitting: Ophthalmology

## 2013-08-01 DIAGNOSIS — H35039 Hypertensive retinopathy, unspecified eye: Secondary | ICD-10-CM

## 2013-08-01 DIAGNOSIS — H4310 Vitreous hemorrhage, unspecified eye: Secondary | ICD-10-CM

## 2013-08-01 DIAGNOSIS — H2510 Age-related nuclear cataract, unspecified eye: Secondary | ICD-10-CM

## 2013-08-01 DIAGNOSIS — H43819 Vitreous degeneration, unspecified eye: Secondary | ICD-10-CM

## 2013-08-01 DIAGNOSIS — I10 Essential (primary) hypertension: Secondary | ICD-10-CM

## 2013-09-02 ENCOUNTER — Encounter (INDEPENDENT_AMBULATORY_CARE_PROVIDER_SITE_OTHER): Payer: Medicare PPO | Admitting: Ophthalmology

## 2013-09-02 DIAGNOSIS — H35039 Hypertensive retinopathy, unspecified eye: Secondary | ICD-10-CM

## 2013-09-02 DIAGNOSIS — H43819 Vitreous degeneration, unspecified eye: Secondary | ICD-10-CM

## 2013-09-02 DIAGNOSIS — I10 Essential (primary) hypertension: Secondary | ICD-10-CM

## 2013-09-02 DIAGNOSIS — H4310 Vitreous hemorrhage, unspecified eye: Secondary | ICD-10-CM

## 2013-09-02 DIAGNOSIS — H3530 Unspecified macular degeneration: Secondary | ICD-10-CM

## 2013-12-15 ENCOUNTER — Telehealth: Payer: Self-pay | Admitting: Nurse Practitioner

## 2013-12-15 ENCOUNTER — Ambulatory Visit: Payer: Commercial Managed Care - HMO | Admitting: Nurse Practitioner

## 2013-12-15 NOTE — Telephone Encounter (Signed)
No showed for scheduled appointment 

## 2014-02-14 ENCOUNTER — Other Ambulatory Visit: Payer: Self-pay

## 2014-02-14 MED ORDER — FEXOFENADINE HCL 180 MG PO TABS: 180.0000 mg | ORAL_TABLET | Freq: Every day | ORAL | Status: DC

## 2014-02-27 ENCOUNTER — Other Ambulatory Visit: Payer: Self-pay | Admitting: Neurology

## 2014-02-27 NOTE — Telephone Encounter (Signed)
Patient no showed last appt.  Called to ask that he reschedule.  Got no answer.  Left message.

## 2014-03-02 ENCOUNTER — Other Ambulatory Visit: Payer: Self-pay | Admitting: Neurology

## 2014-03-02 ENCOUNTER — Other Ambulatory Visit: Payer: Self-pay

## 2014-03-02 MED ORDER — VALSARTAN-HYDROCHLOROTHIAZIDE 160-12.5 MG PO TABS
1.0000 | ORAL_TABLET | Freq: Every day | ORAL | Status: DC
Start: 2014-03-02 — End: 2014-05-31

## 2014-03-09 ENCOUNTER — Ambulatory Visit: Payer: Commercial Managed Care - HMO | Admitting: Nurse Practitioner

## 2014-03-09 ENCOUNTER — Telehealth: Payer: Self-pay | Admitting: Nurse Practitioner

## 2014-03-09 ENCOUNTER — Telehealth: Payer: Self-pay | Admitting: Neurology

## 2014-03-09 NOTE — Telephone Encounter (Signed)
It appears the patient has no showed his appt again today.  Clinic is contacting him regarding reschedule.

## 2014-03-09 NOTE — Telephone Encounter (Signed)
Patient no showed his last appt.  Called back 3x's, the line was busy each time.

## 2014-03-09 NOTE — Telephone Encounter (Signed)
No show for scheduled appt 

## 2014-03-09 NOTE — Telephone Encounter (Signed)
Patient requesting refill of Aricept.

## 2014-03-09 NOTE — Telephone Encounter (Signed)
Matthew Gray stopped by the office requesting refills of donepezil HCL 10 mg. He uses [facility] [phone].

## 2014-03-10 ENCOUNTER — Telehealth: Payer: Self-pay | Admitting: Nurse Practitioner

## 2014-03-10 MED ORDER — DONEPEZIL HCL 5 MG PO TABS: 5.0000 mg | ORAL_TABLET | Freq: Every day | ORAL | Status: DC

## 2014-03-10 NOTE — Telephone Encounter (Signed)
Called pt to set up an appt on 03/14/14 with Hoyle Sauer, NP. I informed pt that he has no showed twice already and that if he misses this appt, that he will be dismissed from the practice. I also informed the pt that per Hoyle Sauer, NP that his Rx for Aricept 10 mg, was called in enough to last until his appt. I advised the pt that if he has any other problems, questions or concerns to call the office. Pt verbalized understanding.

## 2014-03-10 NOTE — Telephone Encounter (Signed)
Patient has an appt scheduled next week.  Refill was sent to last until appt only since his last two have been NS.

## 2014-03-10 NOTE — Telephone Encounter (Signed)
Patient calling to state that he needs some medication. Please return call to patient and advise.

## 2014-03-10 NOTE — Telephone Encounter (Signed)
Patient no showed his appointment yesterday afternoon. He has previously no-showed in the past. Patient would like to make an appointment. Please call to advise.

## 2014-03-10 NOTE — Telephone Encounter (Signed)
Patient came in today stating he needs some medication.  Needs to speak to someone about this today.

## 2014-03-14 ENCOUNTER — Ambulatory Visit (INDEPENDENT_AMBULATORY_CARE_PROVIDER_SITE_OTHER): Payer: Commercial Managed Care - HMO | Admitting: Nurse Practitioner

## 2014-03-14 ENCOUNTER — Encounter: Payer: Self-pay | Admitting: Nurse Practitioner

## 2014-03-14 VITALS — BP 110/80 | HR 68 | Ht 65.0 in | Wt 154.0 lb

## 2014-03-14 DIAGNOSIS — R109 Unspecified abdominal pain: Secondary | ICD-10-CM

## 2014-03-14 MED ORDER — DONEPEZIL HCL 5 MG PO TABS: 5.0000 mg | ORAL_TABLET | Freq: Every day | ORAL | Status: DC

## 2014-03-14 MED ORDER — NAMENDA XR TITRATION PACK 7 & 14 & 21 &28 MG PO CP24: ORAL_CAPSULE | ORAL | Status: DC

## 2014-03-14 NOTE — Patient Instructions (Addendum)
Begin Namenda 7&14&21& 28mg  starter pack, take as directed voucher for free month Then after starter pack begin Namenda 28mg  XR daily Continue Aricept at current dose will refill F/U in 6 months

## 2014-03-14 NOTE — Progress Notes (Signed)
[facility]  PATIENT: Matthew Gray DOB: Sep 16, 1933   REASON FOR VISIT: follow up for memory loss   HISTORY OF PRESENT ILLNESS:Mr. Strothman is a 80,-year-old right-handed [place] American male, accompanied by his sister, returns for follow up for  gradual onset memory trouble. He was last seen by Dr. Krista Blue 06/16/2013. 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 He remains active, mows the yard, he is active at church, goes to Sunday school regularly, calls and visits his friend, over the past 3 years, he himself noticed gradual onset memory trouble, he misplaces things, difficulty with people's name.  He has lived alone for more than 50 years, still independent, driving without difficulty at a familiar route, but he has to be careful to a new place, he still pays his bills, but has to be careful, he checks multiple times to make sure he does not make mistakes  He denies gait difficulty, no falls, no significant pain, he denies no sleep difficulty, denied mood disorder,  He has no family history of dementia, his sister, who accompanied him today, denies significant difficulty with the patient.   There was no family history of dementia   REVIEW OF SYSTEMS: Full 14 system review of systems performed and notable only for those listed, all others are neg:  Constitutional: N/A  Cardiovascular: N/A  Ear/Nose/Throat: N/A  Skin: N/A  Eyes: N/A  Respiratory: N/A  Gastroitestinal: N/A  Hematology/Lymphatic: N/A  Endocrine: N/A Musculoskeletal:N/A  Allergy/Immunology: N/A  Neurological: Memory loss Psychiatric: N/A Sleep : NA   ALLERGIES: Allergies  Allergen Reactions  . Ace Inhibitors Other (See Comments)    unknown  . Atorvastatin     REACTION: myalgias  . Penicillins Other (See Comments)    unknown  . Simvastatin     REACTION:  myalgias    HOME MEDICATIONS: Outpatient Prescriptions Prior to Visit  Medication Sig Dispense Refill  . Cholecalciferol (VITAMIN D3) 2000 UNITS capsule Take 2,000 Units by mouth daily.       Marland Kitchen donepezil (ARICEPT) 5 MG tablet Take 1 tablet (5 mg total) by mouth daily.  7 tablet  0  . fexofenadine (ALLEGRA) 180 MG tablet Take 1 tablet (180 mg total) by mouth daily. For allergies  90 tablet  3  . lovastatin (MEVACOR) 40 MG tablet Take 1 tablet (40 mg total) by mouth daily.  90 tablet  3  . omeprazole (PRILOSEC) 20 MG capsule Take 1 capsule (20 mg total) by mouth daily.  90 capsule  3  . valsartan-hydrochlorothiazide (DIOVAN HCT) 160-12.5 MG per tablet Take 1 tablet by mouth daily.  90 tablet  0  . Memantine HCl ER 28 MG CP24 Take 28 mg by mouth daily.  30 capsule  12  . Memantine HCl ER 7 & 14 & 21 &28 MG CP24 Take 7 mg by mouth daily.  21 capsule  0   No facility-administered medications prior to visit.    PAST MEDICAL HISTORY: Past Medical History  Diagnosis Date  . 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 VITAMIN D DEFICIENCY 06/04/2010  . HYPERLIPIDEMIA 04/03/2007  . DEPRESSION 11/30/2009  . HYPERTENSION 04/03/2007  . CORONARY ARTERY DISEASE 10/27/2007  . PERIPHERAL VASCULAR DISEASE 10/27/2007  . SINUSITIS- ACUTE-NOS 10/08/2009  . ALLERGIC RHINITIS 10/27/2007  . CHRONIC OBSTRUCTIVE PULMONARY DISEASE, ACUTE EXACERBATION 11/22/2007  . ASTHMA 10/27/2007  . COPD 10/27/2007  .  BENIGN PROSTATIC HYPERTROPHY 10/27/2007  . SHOULDER PAIN, LEFT 06/21/2009  . ANKLE PAIN, RIGHT 11/30/2009  . SCIATICA, LEFT 11/08/2009  . BACK PAIN 10/08/2009  . MYALGIA 02/04/2008  . Pain in Soft Tissues of Limb 02/04/2008  . FATIGUE 11/30/2009  . RASH-NONVESICULAR 10/27/2007  . PERIPHERAL EDEMA 11/16/2009  . Hemoptysis 04/24/2009  . COLONIC POLYPS, HX OF 10/27/2007  . PARESTHESIA 09/02/2010  . Peripheral neuropathy 03/08/2012  . Obstructive chronic bronchitis without exacerbation     PAST SURGICAL HISTORY: Past Surgical History    Procedure Laterality Date  . None      FAMILY HISTORY: Family History  Problem Relation Age of Onset  . Hypertension Sister     3 sisters  . Anuerysm Brother     died with  . Stroke Other   . Cancer Other     lung and breast  . Alcohol abuse Other   . Cancer Mother     BREAST  . Cancer Father     LUNG    SOCIAL HISTORY: History   Social History  . Marital Status: Divorced    Spouse Name: N/A    Number of Children: 3  . Years of Education: 12   Occupational History  .     Social History Main Topics  . Smoking status: Never Smoker   . Smokeless tobacco: Never Used     Comment: Quit ten years ago.  . Alcohol Use: No  . Drug Use: No  . Sexual Activity: Not on file   Other Topics Concern  . Not on file   Social History Narrative   He had 12 years of education, retired from [employer] at age 7, lives alone for 31 years, has 3 children   Patient right handed.   Caffeine-  Coffee three times a week.     PHYSICAL EXAM  Filed Vitals:   03/14/14 1522  BP: 110/80  Pulse: 68  Height: 5\' 5"  (1.651 m)  Weight: 154 lb (69.854 kg)   Body mass index is 25.63 kg/(m^2). Generalized: In no acute distress  Neck: Supple, no carotid bruits  Cardiac: Regular rate rhythm  Pulmonary: Clear to auscultation bilaterally  Musculoskeletal: No deformity  Neurological examination  Mentation: Alert following commands, MMSE 20/30, he missed items in orientation, he missed 3 out of 3 recalls, he has difficulty spelling  world backwards, animal naming was 9 Cranial nerve II-XII: Pupils were equal round reactive to light extraocular movements were full, visual field were full on confrontational test. facial sensation and strength were normal. hearing was intact to finger rubbing bilaterally. Uvula tongue midline. head turning and shoulder shrug and were normal and symmetric.Tongue protrusion into cheek strength was normal.  Motor: normal tone, bulk and strength.  Coordination: Normal  finger to nose, heel-to-shin bilaterally  Gait: Rising up from seated position without assistance, mild leaning forward, mild difficulty with tandem walking,  Romberg signs: Negative  Deep tendon reflexes: Brachioradialis 2/2, biceps 2/2, triceps 2/2, patellar 2/2, Achilles 1/1, plantar responses were flexor bilaterally.     DIAGNOSTIC DATA (LABS, IMAGING, TESTING) - I reviewed patient records, labs, notes, testing and imaging myself where available.  Lab Results  Component Value Date   WBC 7.0 02/25/2013   HGB 13.3 02/25/2013   HCT 40.7 02/25/2013   MCV 80.8 02/25/2013   PLT 146* 02/25/2013      Component Value Date/Time   NA 139 02/25/2013 0815   K 3.5 02/25/2013 0815   CL 99 02/25/2013 0815  CO2 30 02/25/2013 0815   GLUCOSE 87 02/25/2013 0815   GLUCOSE 85 06/24/2006 1725   BUN 22 02/25/2013 0815   CREATININE 1.27 02/25/2013 0815   CALCIUM 9.0 02/25/2013 0815   PROT 7.3 02/25/2013 0815   ALBUMIN 3.6 02/25/2013 0815   AST 16 02/25/2013 0815   ALT 10 02/25/2013 0815   ALKPHOS 60 02/25/2013 0815   BILITOT 0.6 02/25/2013 0815   GFRNONAA 53* 02/25/2013 0815   GFRAA 62* 02/25/2013 0815     ASSESSMENT AND PLAN  80 y.o. year old male  has a past medical history of three to 4 year history -year gradual onset memory  Problems, Mini-Mental Status exam 20/30 today. No family history of dementia. Namenda was not started after last visit with Dr. Krista Blue.   Begin Namenda 7&14&21& 28mg  starter pack, take as directed voucher for free month given Then after starter pack begin Namenda 28mg  XR daily Continue Aricept at current dose will refill F/U in 6 months Dennie Bible, [facility], [facility], [facility] [address] [place], [place] [address] [phone]

## 2014-03-15 ENCOUNTER — Other Ambulatory Visit: Payer: Self-pay | Admitting: Neurology

## 2014-03-15 NOTE — Telephone Encounter (Signed)
Patient's sister Davy Pique concerned as to why Rx refill of donepezil (ARICEPT) 5 MG tablet was changed from 10 mg.  Please call and advise.  Call cell # [phone] if calling back today. If calling tomorrow call # [phone].  Thanks

## 2014-03-15 NOTE — Telephone Encounter (Signed)
Patient was started on 5mg  and increased to 10mg .  The 10mg  was discontinued from the chart in error.  Please refer to OV from 2014

## 2014-03-16 ENCOUNTER — Telehealth: Payer: Self-pay | Admitting: Nurse Practitioner

## 2014-03-16 NOTE — Telephone Encounter (Signed)
Sarah with [facility], stated NAMENDA XR TITRATION PACK 7 & 14 & 21 &28 MG CP24 on back order with Manufacturer.  Please call @ [phone] and advise.  Thanks

## 2014-03-16 NOTE — Telephone Encounter (Signed)
I called back.  The pharmacy is not able to get the Namenda Titration pack because it is on backorder.  They will dispense Namenda 7mg  caps with titration instructions.

## 2014-04-18 ENCOUNTER — Other Ambulatory Visit: Payer: Self-pay

## 2014-04-18 MED ORDER — LOVASTATIN 40 MG PO TABS: 40.0000 mg | ORAL_TABLET | Freq: Every day | ORAL | Status: DC

## 2014-05-31 ENCOUNTER — Other Ambulatory Visit: Payer: Self-pay

## 2014-05-31 MED ORDER — VALSARTAN-HYDROCHLOROTHIAZIDE 160-12.5 MG PO TABS: 1.0000 | ORAL_TABLET | Freq: Every day | ORAL | Status: DC

## 2014-07-12 ENCOUNTER — Encounter: Payer: Self-pay | Admitting: Neurology

## 2014-07-13 ENCOUNTER — Telehealth: Payer: Self-pay | Admitting: Internal Medicine

## 2014-07-13 MED ORDER — VALSARTAN-HYDROCHLOROTHIAZIDE 160-12.5 MG PO TABS: 1.0000 | ORAL_TABLET | Freq: Every day | ORAL | Status: DC

## 2014-07-13 NOTE — Telephone Encounter (Signed)
Please give patient a call in regards.  Patients states he is out of meds.  Patient is requesting call back by 2pm.

## 2014-07-13 NOTE — Telephone Encounter (Signed)
Called the patient schedule with Dr. Jenny Reichmann 07/19/14 at 9 AM.. Sent prescription to [facility].

## 2014-07-13 NOTE — Telephone Encounter (Signed)
Patient is requesting script for diovan to [facility]

## 2014-07-18 ENCOUNTER — Encounter: Payer: Self-pay | Admitting: Neurology

## 2014-07-19 ENCOUNTER — Encounter: Payer: Self-pay | Admitting: Internal Medicine

## 2014-07-19 ENCOUNTER — Ambulatory Visit: Payer: Medicare PPO | Admitting: Internal Medicine

## 2014-07-19 ENCOUNTER — Ambulatory Visit (INDEPENDENT_AMBULATORY_CARE_PROVIDER_SITE_OTHER): Payer: Commercial Managed Care - HMO | Admitting: Internal Medicine

## 2014-07-19 VITALS — BP 130/84 | HR 86 | Temp 98.2°F | Ht 69.0 in | Wt 156.5 lb

## 2014-07-19 DIAGNOSIS — Z23 Encounter for immunization: Secondary | ICD-10-CM

## 2014-07-19 DIAGNOSIS — Z0000 Encounter for general adult medical examination without abnormal findings: Secondary | ICD-10-CM

## 2014-07-19 DIAGNOSIS — J019 Acute sinusitis, unspecified: Secondary | ICD-10-CM | POA: Insufficient documentation

## 2014-07-19 DIAGNOSIS — J302 Other seasonal allergic rhinitis: Secondary | ICD-10-CM

## 2014-07-19 DIAGNOSIS — J3089 Other allergic rhinitis: Secondary | ICD-10-CM

## 2014-07-19 DIAGNOSIS — J018 Other acute sinusitis: Secondary | ICD-10-CM

## 2014-07-19 DIAGNOSIS — R531 Weakness: Secondary | ICD-10-CM

## 2014-07-19 MED ORDER — METHYLPREDNISOLONE ACETATE 80 MG/ML IJ SUSP
80.0000 mg | Freq: Once | INTRAMUSCULAR | Status: AC
  Administered 2014-07-19: 80 mg via INTRAMUSCULAR

## 2014-07-19 MED ORDER — AZITHROMYCIN 250 MG PO TABS: ORAL_TABLET | ORAL | Status: DC

## 2014-07-19 MED ORDER — FLUTICASONE PROPIONATE 50 MCG/ACT NA SUSP: 2.0000 | Freq: Every day | NASAL | Status: DC

## 2014-07-19 NOTE — Progress Notes (Signed)
Subjective:    Patient ID: Matthew Gray, male    DOB: December 22, 1933, 80 y.o.   MRN: [id number]  HPI  Here for wellness and f/u;  Overall doing ok;  Pt denies CP, worsening SOB, DOE, wheezing, orthopnea, PND, worsening LE edema, palpitations, dizziness or syncope.  Pt denies neurological change such as new headache, facial or extremity weakness.  Pt denies polydipsia, polyuria, or low sugar symptoms. Pt states overall good compliance with treatment and medications, good tolerability, and has been trying to follow lower cholesterol diet.  Pt denies worsening depressive symptoms, suicidal ideation or panic. No fever, night sweats, wt loss, loss of appetite, or other constitutional symptoms.  Pt states good ability with ADL's, has low fall risk, home safety reviewed and adequate, no other significant changes in hearing or vision, and only occasionally active with exercise. Also  Here with 2-3 days acute onset fever, facial pain, pressure, headache, general weakness and malaise, and greenish d/c.  Does have several wks ongoing nasal allergy symptoms with clearish congestion, itch and sneezing, without fever, pain, ST, cough, swelling or wheezing.   Past Medical History  Diagnosis Date  . 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 VITAMIN D DEFICIENCY 06/04/2010  . HYPERLIPIDEMIA 04/03/2007  . DEPRESSION 11/30/2009  . HYPERTENSION 04/03/2007  . CORONARY ARTERY DISEASE 10/27/2007  . PERIPHERAL VASCULAR DISEASE 10/27/2007  . SINUSITIS- ACUTE-NOS 10/08/2009  . ALLERGIC RHINITIS 10/27/2007  . CHRONIC OBSTRUCTIVE PULMONARY DISEASE, ACUTE EXACERBATION 11/22/2007  . ASTHMA 10/27/2007  . COPD 10/27/2007  . BENIGN PROSTATIC HYPERTROPHY 10/27/2007  . SHOULDER PAIN, LEFT 06/21/2009  . ANKLE PAIN, RIGHT 11/30/2009  . SCIATICA, LEFT 11/08/2009  . BACK PAIN 10/08/2009  . MYALGIA 02/04/2008  . Pain in Soft Tissues of Limb 02/04/2008  . FATIGUE 11/30/2009  . RASH-NONVESICULAR 10/27/2007  . PERIPHERAL EDEMA 11/16/2009  . Hemoptysis 04/24/2009    . COLONIC POLYPS, HX OF 10/27/2007  . PARESTHESIA 09/02/2010  . Peripheral neuropathy 03/08/2012  . Obstructive chronic bronchitis without exacerbation    Past Surgical History  Procedure Laterality Date  . None      reports that he has never smoked. He has never used smokeless tobacco. He reports that he does not drink alcohol or use illicit drugs. family history includes Alcohol abuse in his other; Anuerysm in his brother; Cancer in his father, mother, and other; Hypertension in his sister; Stroke in his other. Allergies  Allergen Reactions  . Ace Inhibitors Other (See Comments)    unknown  . Atorvastatin     REACTION: myalgias  . Penicillins Other (See Comments)    unknown  . Simvastatin     REACTION: myalgias   Current Outpatient Prescriptions on File Prior to Visit  Medication Sig Dispense Refill  . Cholecalciferol (VITAMIN D3) 2000 UNITS capsule Take 2,000 Units by mouth daily.     Marland Kitchen donepezil (ARICEPT) 10 MG tablet TAKE ONE (1) TABLET BY MOUTH AT BEDTIME 30 tablet 6  . fexofenadine (ALLEGRA) 180 MG tablet Take 1 tablet (180 mg total) by mouth daily. For allergies 90 tablet 3  . lovastatin (MEVACOR) 40 MG tablet Take 1 tablet (40 mg total) by mouth daily. 90 tablet 0  . NAMENDA XR TITRATION PACK 7 & 14 & 21 &28 MG CP24 Take starter pack as directed 1 capsule 0  . omeprazole (PRILOSEC) 20 MG capsule Take 1 capsule (20 mg total) by mouth daily. 90 capsule 3  . valsartan-hydrochlorothiazide (DIOVAN HCT) 160-12.5 MG per tablet Take 1 tablet by mouth daily. [place]  tablet 11   No current facility-administered medications on file prior to visit.   Review of Systems Constitutional: Negative for increased diaphoresis, other activity, appetite or other siginficant weight change  HENT: Negative for worsening hearing loss, ear pain, facial swelling, mouth sores and neck stiffness.   Eyes: Negative for other worsening pain, redness or visual disturbance.  Respiratory: Negative for shortness  of breath and wheezing.   Cardiovascular: Negative for chest pain and palpitations.  Gastrointestinal: Negative for diarrhea, blood in stool, abdominal distention or other pain Genitourinary: Negative for hematuria, flank pain or change in urine volume.  Musculoskeletal: Negative for myalgias or other joint complaints.  Skin: Negative for color change and wound.  Neurological: Negative for syncope and numbness. other than noted Hematological: Negative for adenopathy. or other swelling Psychiatric/Behavioral: Negative for hallucinations, self-injury, decreased concentration or other worsening agitation.      Objective:   Physical Exam BP 130/84 mmHg  Pulse 86  Temp(Src) 98.2 F (36.8 C) (Oral)  Ht 5\' 9"  (1.753 m)  Wt 156 lb 8 oz (70.988 kg)  BMI 23.10 kg/m2  SpO2 99% VS noted, mild ill Constitutional: Pt is oriented to person, place, and time. Appears well-developed and well-nourished.  Head: Normocephalic and atraumatic.  Right Ear: External ear normal.  Left Ear: External ear normal.  Nose: Nose normal.  Mouth/Throat: Oropharynx is clear and moist.  Eyes: Conjunctivae and EOM are normal. Pupils are equal, round, and reactive to light.  Neck: Normal range of motion. Neck supple. No JVD present. No tracheal deviation present.  Bilat tm's with mild erythema.  Max sinus areas mild tender.  Pharynx with mild erythema, no exudate Cardiovascular: Normal rate, regular rhythm, normal heart sounds and intact distal pulses.   Pulmonary/Chest: Effort normal and breath sounds without rales or wheezing  Abdominal: Soft. Bowel sounds are normal. NT. No HSM  Musculoskeletal: Normal range of motion. Exhibits no edema.  Lymphadenopathy:  Has no cervical adenopathy.  Neurological: Pt is alert and oriented to person, place, and time. Pt has normal reflexes. No cranial nerve deficit. Motor grossly intact, at baseline confused Skin: Skin is warm and dry. No rash noted.  Psychiatric:  Has normal mood  and affect. Behavior is normal.     Assessment & Plan:

## 2014-07-19 NOTE — Assessment & Plan Note (Signed)
Mild to mod, for depomedrol IM,  to f/u any worsening symptoms or concerns 

## 2014-07-19 NOTE — Assessment & Plan Note (Signed)
Mild to mod, for antibx course,  to f/u any worsening symptoms or concerns 

## 2014-07-19 NOTE — Progress Notes (Signed)
Pre visit review using our clinic review tool, if applicable. No additional management support is needed unless otherwise documented below in the visit note. 

## 2014-07-19 NOTE — Patient Instructions (Signed)
You had the steroid shot today, and the new Prevnar pneumonia shot  Please take all new medication as prescribed - the antibiotic, and the flonase  Please continue all other medications as before, and refills have been done if requested.  Please have the pharmacy call with any other refills you may need.  Please continue your efforts at being more active, low cholesterol diet, and weight control.  You are otherwise up to date with prevention measures today.  Please keep your appointments with your specialists as you may have planned  Please go to the XRAY Department in the Basement (go straight as you get off the elevator) for the x-ray testing - TODAY  Please go to the LAB in the Basement (turn left off the elevator) for the tests to be done for Friday Nov 27 (lab is currently closed for holiday)  Please remember to sign up for MyChart if you have not done so, as this will be important to you in the future with finding out test results, communicating by private email, and scheduling acute appointments online when needed.  Please return in 6 months, or sooner if needed

## 2014-07-19 NOTE — Assessment & Plan Note (Addendum)
Etiology unclear, Exam otherwise benign, to check labs as documented, follow with expectant management, also for cxr

## 2014-07-19 NOTE — Assessment & Plan Note (Signed)

## 2014-07-22 ENCOUNTER — Other Ambulatory Visit: Payer: Self-pay | Admitting: Internal Medicine

## 2014-07-25 ENCOUNTER — Other Ambulatory Visit: Payer: Self-pay

## 2014-07-25 MED ORDER — LOVASTATIN 40 MG PO TABS: 40.0000 mg | ORAL_TABLET | Freq: Every day | ORAL | Status: DC

## 2014-09-04 ENCOUNTER — Ambulatory Visit (INDEPENDENT_AMBULATORY_CARE_PROVIDER_SITE_OTHER): Payer: Medicare PPO | Admitting: Ophthalmology

## 2014-09-14 ENCOUNTER — Ambulatory Visit: Payer: Commercial Managed Care - HMO | Admitting: Nurse Practitioner

## 2014-10-19 ENCOUNTER — Other Ambulatory Visit: Payer: Self-pay | Admitting: Neurology

## 2014-11-08 ENCOUNTER — Ambulatory Visit: Payer: Medicare PPO

## 2014-12-06 ENCOUNTER — Other Ambulatory Visit: Payer: Self-pay | Admitting: Neurology

## 2014-12-06 NOTE — Telephone Encounter (Signed)
No showed last appt  

## 2014-12-19 ENCOUNTER — Other Ambulatory Visit: Payer: Self-pay | Admitting: Internal Medicine

## 2015-01-17 ENCOUNTER — Ambulatory Visit: Payer: Commercial Managed Care - HMO | Admitting: Internal Medicine

## 2015-01-17 DIAGNOSIS — Z0289 Encounter for other administrative examinations: Secondary | ICD-10-CM

## 2015-02-06 ENCOUNTER — Encounter: Payer: Self-pay | Admitting: Internal Medicine

## 2015-02-06 ENCOUNTER — Other Ambulatory Visit (INDEPENDENT_AMBULATORY_CARE_PROVIDER_SITE_OTHER): Payer: Commercial Managed Care - HMO

## 2015-02-06 ENCOUNTER — Ambulatory Visit (INDEPENDENT_AMBULATORY_CARE_PROVIDER_SITE_OTHER): Payer: Commercial Managed Care - HMO | Admitting: Internal Medicine

## 2015-02-06 VITALS — BP 122/76 | HR 90 | Temp 97.7°F | Ht 68.0 in | Wt 151.0 lb

## 2015-02-06 DIAGNOSIS — Z0000 Encounter for general adult medical examination without abnormal findings: Secondary | ICD-10-CM

## 2015-02-06 DIAGNOSIS — J449 Chronic obstructive pulmonary disease, unspecified: Secondary | ICD-10-CM | POA: Diagnosis not present

## 2015-02-06 DIAGNOSIS — R05 Cough: Secondary | ICD-10-CM

## 2015-02-06 DIAGNOSIS — F0390 Unspecified dementia without behavioral disturbance: Secondary | ICD-10-CM | POA: Diagnosis not present

## 2015-02-06 DIAGNOSIS — R059 Cough, unspecified: Secondary | ICD-10-CM | POA: Insufficient documentation

## 2015-02-06 DIAGNOSIS — J302 Other seasonal allergic rhinitis: Secondary | ICD-10-CM | POA: Diagnosis not present

## 2015-02-06 LAB — CBC WITH DIFFERENTIAL/PLATELET
BASOS ABS: 0 10*3/uL (ref 0.0–0.1)
Basophils Relative: 0.5 % (ref 0.0–3.0)
EOS PCT: 2.3 % (ref 0.0–5.0)
Eosinophils Absolute: 0.2 10*3/uL (ref 0.0–0.7)
HCT: 43.7 % (ref 39.0–52.0)
Hemoglobin: 14.2 g/dL (ref 13.0–17.0)
LYMPHS PCT: 12.9 % (ref 12.0–46.0)
Lymphs Abs: 0.9 10*3/uL (ref 0.7–4.0)
MCHC: 32.6 g/dL (ref 30.0–36.0)
MCV: 83.4 fl (ref 78.0–100.0)
Monocytes Absolute: 0.4 10*3/uL (ref 0.1–1.0)
Monocytes Relative: 6.3 % (ref 3.0–12.0)
NEUTROS PCT: 78 % — AB (ref 43.0–77.0)
Neutro Abs: 5.5 10*3/uL (ref 1.4–7.7)
PLATELETS: 213 10*3/uL (ref 150.0–400.0)
RBC: 5.25 Mil/uL (ref 4.22–5.81)
RDW: 15 % (ref 11.5–15.5)
WBC: 7.1 10*3/uL (ref 4.0–10.5)

## 2015-02-06 LAB — URINALYSIS, ROUTINE W REFLEX MICROSCOPIC
BILIRUBIN URINE: NEGATIVE
HGB URINE DIPSTICK: NEGATIVE
KETONES UR: NEGATIVE
Leukocytes, UA: NEGATIVE
Nitrite: NEGATIVE
PH: 8 (ref 5.0–8.0)
Specific Gravity, Urine: 1.02 (ref 1.000–1.030)
TOTAL PROTEIN, URINE-UPE24: NEGATIVE
URINE GLUCOSE: NEGATIVE
Urobilinogen, UA: 1 (ref 0.0–1.0)

## 2015-02-06 LAB — BASIC METABOLIC PANEL
BUN: 14 mg/dL (ref 6–23)
CHLORIDE: 100 meq/L (ref 96–112)
CO2: 36 mEq/L — ABNORMAL HIGH (ref 19–32)
Calcium: 9.6 mg/dL (ref 8.4–10.5)
Creatinine, Ser: 1.16 mg/dL (ref 0.40–1.50)
GFR: 78.2 mL/min (ref >60.00)
Glucose, Bld: 79 mg/dL (ref 70–99)
POTASSIUM: 4.1 meq/L (ref 3.5–5.1)
Sodium: 139 mEq/L (ref 135–145)

## 2015-02-06 LAB — LIPID PANEL
CHOL/HDL RATIO: 3
Cholesterol: 163 mg/dL (ref 0–200)
HDL: 63.3 mg/dL (ref >39.00)
LDL Cholesterol: 85 mg/dL (ref 0–99)
NonHDL: 99.7
TRIGLYCERIDES: 72 mg/dL (ref 0.0–149.0)
VLDL: 14.4 mg/dL (ref 0.0–40.0)

## 2015-02-06 LAB — HEPATIC FUNCTION PANEL
ALK PHOS: 65 U/L (ref 39–117)
ALT: 14 U/L (ref 0–53)
AST: 18 U/L (ref 0–37)
Albumin: 3.9 g/dL (ref 3.5–5.2)
Bilirubin, Direct: 0.2 mg/dL (ref 0.0–0.3)
Total Bilirubin: 0.8 mg/dL (ref 0.2–1.2)
Total Protein: 7.1 g/dL (ref 6.0–8.3)

## 2015-02-06 LAB — TSH: TSH: 1.08 u[IU]/mL (ref 0.35–4.50)

## 2015-02-06 MED ORDER — METHYLPREDNISOLONE ACETATE 80 MG/ML IJ SUSP
80.0000 mg | Freq: Once | INTRAMUSCULAR | Status: AC
  Administered 2015-02-06: 80 mg via INTRAMUSCULAR

## 2015-02-06 MED ORDER — FEXOFENADINE HCL 180 MG PO TABS: 180.0000 mg | ORAL_TABLET | Freq: Every day | ORAL | Status: DC

## 2015-02-06 MED ORDER — TRIAMCINOLONE ACETONIDE 55 MCG/ACT NA AERO: 2.0000 | INHALATION_SPRAY | Freq: Every day | NASAL | Status: DC

## 2015-02-06 NOTE — Assessment & Plan Note (Signed)
Suspect related to post nasal gtt- for cxr today

## 2015-02-06 NOTE — Assessment & Plan Note (Signed)

## 2015-02-06 NOTE — Addendum Note (Signed)
Addended by: Lyman Bishop on: 02/06/2015 10:42 AM   Modules accepted: Orders

## 2015-02-06 NOTE — Assessment & Plan Note (Signed)
stable overall by history and exam, and pt to continue medical treatment as before,  to f/u any worsening symptoms or concerns 

## 2015-02-06 NOTE — Progress Notes (Signed)
Subjective:    Patient ID: Matthew Gray, male    DOB: 1933-10-16, 81 y.o.   MRN: [id number]  HPI  Here for wellness and f/u;  Overall doing ok;  Pt denies Chest pain, worsening SOB, DOE, wheezing, orthopnea, PND, worsening LE edema, palpitations, dizziness or syncope.  Pt denies neurological change such as new headache, facial or extremity weakness.  Pt denies polydipsia, polyuria, or low sugar symptoms. Pt states overall good compliance with treatment and medications, good tolerability, and has been trying to follow appropriate diet.  Pt denies worsening depressive symptoms, suicidal ideation or panic. No fever, night sweats, wt loss, loss of appetite, or other constitutional symptoms.  Pt states good ability with ADL's, has low fall risk, home safety reviewed and adequate, no other significant changes in hearing or vision, and only occasionally active with exercise.  Memory remains a challenge, but wife states today no worse.  Does c/o fatigue, and feeling staggery like he's drunk occasionally.  Does have several wks ongoing nasal allergy symptoms with clearish congestion, itch and sneezing, without fever, pain, ST, swelling or wheezing but non prod cough has been marked increased with nasal drainage last few wks.  Also some recent wt loss several lbs with GI symtpoms  Not taking the flonase daily. Wt Readings from Last 3 Encounters:  02/06/15 151 lb (68.493 kg)  07/19/14 156 lb 8 oz (70.988 kg)  03/14/14 154 lb (69.854 kg)   Past Medical History  Diagnosis Date  . 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 VITAMIN D DEFICIENCY 06/04/2010  . HYPERLIPIDEMIA 04/03/2007  . DEPRESSION 11/30/2009  . HYPERTENSION 04/03/2007  . CORONARY ARTERY DISEASE 10/27/2007  . PERIPHERAL VASCULAR DISEASE 10/27/2007  . SINUSITIS- ACUTE-NOS 10/08/2009  . ALLERGIC RHINITIS 10/27/2007  . CHRONIC OBSTRUCTIVE PULMONARY DISEASE, ACUTE EXACERBATION 11/22/2007  . ASTHMA 10/27/2007  . COPD 10/27/2007  . BENIGN PROSTATIC HYPERTROPHY  10/27/2007  . SHOULDER PAIN, LEFT 06/21/2009  . ANKLE PAIN, RIGHT 11/30/2009  . SCIATICA, LEFT 11/08/2009  . BACK PAIN 10/08/2009  . MYALGIA 02/04/2008  . Pain in Soft Tissues of Limb 02/04/2008  . FATIGUE 11/30/2009  . RASH-NONVESICULAR 10/27/2007  . PERIPHERAL EDEMA 11/16/2009  . Hemoptysis 04/24/2009  . COLONIC POLYPS, HX OF 10/27/2007  . PARESTHESIA 09/02/2010  . Peripheral neuropathy 03/08/2012  . Obstructive chronic bronchitis without exacerbation    Past Surgical History  Procedure Laterality Date  . None      reports that he has never smoked. He has never used smokeless tobacco. He reports that he does not drink alcohol or use illicit drugs. family history includes Alcohol abuse in his other; Anuerysm in his brother; Cancer in his father, mother, and other; Hypertension in his sister; Stroke in his other. Allergies  Allergen Reactions  . Ace Inhibitors Other (See Comments)    unknown  . Atorvastatin     REACTION: myalgias  . Penicillins Other (See Comments)    unknown  . Simvastatin     REACTION: myalgias   Current Outpatient Prescriptions on File Prior to Visit  Medication Sig Dispense Refill  . Cholecalciferol (VITAMIN D3) 2000 UNITS capsule Take 2,000 Units by mouth daily.     Marland Kitchen donepezil (ARICEPT) 10 MG tablet TAKE 1 TABLET BY MOUTH EVERY NIGHT AT BEDTIME 30 tablet 6  . fexofenadine (ALLEGRA) 180 MG tablet Take 1 tablet (180 mg total) by mouth daily. For allergies 90 tablet 3  . lovastatin (MEVACOR) 40 MG tablet Take 1 tablet (40 mg total) by mouth daily. 90 tablet  3  . valsartan-hydrochlorothiazide (DIOVAN HCT) 160-12.5 MG per tablet Take 1 tablet by mouth daily. 30 tablet 11  . azithromycin (ZITHROMAX Z-PAK) 250 MG tablet Use as directed (Patient not taking: Reported on 02/06/2015) 6 tablet 1  . fluticasone (FLONASE) 50 MCG/ACT nasal spray Place 2 sprays into both nostrils daily. As needed with congestion (Patient not taking: Reported on 02/06/2015) 16 g 5  . NAMENDA XR TITRATION  PACK 7 & 14 & 21 &28 MG CP24 Take starter pack as directed (Patient not taking: Reported on 02/06/2015) 1 capsule 0  . omeprazole (PRILOSEC) 20 MG capsule Take 1 capsule (20 mg total) by mouth daily. (Patient not taking: Reported on 02/06/2015) 90 capsule 3   No current facility-administered medications on file prior to visit.     Review of Systems Constitutional: Negative for increased diaphoresis, other activity, appetite or siginficant weight change other than noted HENT: Negative for worsening hearing loss, ear pain, facial swelling, mouth sores and neck stiffness.   Eyes: Negative for other worsening pain, redness or visual disturbance.  Respiratory: Negative for shortness of breath and wheezing  Cardiovascular: Negative for chest pain and palpitations.  Gastrointestinal: Negative for diarrhea, blood in stool, abdominal distention or other pain Genitourinary: Negative for hematuria, flank pain or change in urine volume.  Musculoskeletal: Negative for myalgias or other joint complaints.  Skin: Negative for color change and wound or drainage.  Neurological: Negative for syncope and numbness. other than noted Hematological: Negative for adenopathy. or other swelling Psychiatric/Behavioral: Negative for hallucinations, SI, self-injury, decreased concentration or other worsening agitation.      Objective:   Physical Exam BP 122/76 mmHg  Pulse 90  Temp(Src) 97.7 F (36.5 C) (Oral)  Ht 5\' 8"  (1.727 m)  Wt 151 lb (68.493 kg)  BMI 22.96 kg/m2  SpO2 96% VS noted,  Constitutional: Pt is oriented to person, place, and time. Appears well-developed and well-nourished, in no significant distress Head: Normocephalic and atraumatic.  Right Ear: External ear normal.  Left Ear: External ear normal.  Nose: Nose normal.  Mouth/Throat: Oropharynx is clear and moist.  Eyes: Conjunctivae and EOM are normal. Pupils are equal, round, and reactive to light.  Bilat tm's with mild erythema.  Max sinus  areas non tender.  Pharynx with mild erythema, no exudate Neck: Normal range of motion. Neck supple. No JVD present. No tracheal deviation present or significant neck LA or mass Cardiovascular: Normal rate, regular rhythm, normal heart sounds and intact distal pulses.   Pulmonary/Chest: Effort normal and breath sounds without rales or wheezing  Abdominal: Soft. Bowel sounds are normal. NT. No HSM  Musculoskeletal: Normal range of motion. Exhibits no edema.  Lymphadenopathy:  Has no cervical adenopathy.  Neurological: Pt is alert and oriented to person, place, baseline for him. Pt has normal reflexes. No cranial nerve deficit. Motor grossly intact Skin: Skin is warm and dry. No rash noted.  Psychiatric:  Has normal mood and affect. Behavior is normal.      Assessment & Plan:

## 2015-02-06 NOTE — Progress Notes (Signed)
Pre visit review using our clinic review tool, if applicable. No additional management support is needed unless otherwise documented below in the visit note. 

## 2015-02-06 NOTE — Patient Instructions (Signed)
You had the steroid shot today  OK to stop the flonase  Please take all new medication as prescribed - the nasacort AQ  Please continue all other medications as before, and refills have been done if requested - including the allegra  Please have the pharmacy call with any other refills you may need.  Please continue your efforts at being more active, low cholesterol diet, and weight control.  You are otherwise up to date with prevention measures today.  Please keep your appointments with your specialists as you may have planned  Please go to the XRAY Department in the Basement (go straight as you get off the elevator) for the x-ray testing  Please go to the LAB in the Basement (turn left off the elevator) for the tests to be done today  You will be contacted by phone if any changes need to be made immediately.  Otherwise, you will receive a letter about your results with an explanation, but please check with MyChart first.  Please remember to sign up for MyChart if you have not done so, as this will be important to you in the future with finding out test results, communicating by private email, and scheduling acute appointments online when needed.  Please return in 6 months, or sooner if needed

## 2015-02-06 NOTE — Assessment & Plan Note (Signed)
stable overall by history and exam, recent data reviewed with pt, and pt to continue medical treatment as before,  to f/u any worsening symptoms or concerns SpO2 Readings from Last 3 Encounters:  02/06/15 96%  07/19/14 99%  03/04/13 99%

## 2015-02-06 NOTE — Assessment & Plan Note (Signed)
Mild to mod, for depomedrol IM, allegra prn, also change flonsae to nasacort, to f/u any worsening symptoms or concerns

## 2015-06-13 ENCOUNTER — Emergency Department (HOSPITAL_COMMUNITY): Payer: Commercial Managed Care - HMO

## 2015-06-13 ENCOUNTER — Encounter (HOSPITAL_COMMUNITY): Payer: Self-pay | Admitting: Emergency Medicine

## 2015-06-13 ENCOUNTER — Emergency Department (HOSPITAL_COMMUNITY)
Admission: EM | Admit: 2015-06-13 | Discharge: 2015-06-13 | Disposition: A | Discharge status: Home / Self Care | Payer: Commercial Managed Care - HMO | Attending: Emergency Medicine | Admitting: Emergency Medicine

## 2015-06-13 DIAGNOSIS — E785 Hyperlipidemia, unspecified: Secondary | ICD-10-CM | POA: Insufficient documentation

## 2015-06-13 DIAGNOSIS — Z79899 Other long term (current) drug therapy: Secondary | ICD-10-CM | POA: Diagnosis not present

## 2015-06-13 DIAGNOSIS — I2510 Atherosclerotic heart disease of native coronary artery without angina pectoris: Secondary | ICD-10-CM | POA: Diagnosis not present

## 2015-06-13 DIAGNOSIS — Z880 Allergy status to penicillin: Secondary | ICD-10-CM | POA: Insufficient documentation

## 2015-06-13 DIAGNOSIS — Z86010 Personal history of colonic polyps: Secondary | ICD-10-CM | POA: Diagnosis not present

## 2015-06-13 DIAGNOSIS — F0390 Unspecified dementia without behavioral disturbance: Secondary | ICD-10-CM | POA: Insufficient documentation

## 2015-06-13 DIAGNOSIS — I10 Essential (primary) hypertension: Secondary | ICD-10-CM | POA: Insufficient documentation

## 2015-06-13 DIAGNOSIS — R51 Headache: Secondary | ICD-10-CM | POA: Diagnosis not present

## 2015-06-13 DIAGNOSIS — J449 Chronic obstructive pulmonary disease, unspecified: Secondary | ICD-10-CM | POA: Insufficient documentation

## 2015-06-13 DIAGNOSIS — M542 Cervicalgia: Secondary | ICD-10-CM | POA: Diagnosis not present

## 2015-06-13 DIAGNOSIS — F329 Major depressive disorder, single episode, unspecified: Secondary | ICD-10-CM | POA: Diagnosis not present

## 2015-06-13 DIAGNOSIS — M62838 Other muscle spasm: Secondary | ICD-10-CM | POA: Insufficient documentation

## 2015-06-13 LAB — CBC WITH DIFFERENTIAL/PLATELET
BASOS ABS: 0 10*3/uL (ref 0.0–0.1)
Basophils Relative: 0 %
Eosinophils Absolute: 0.2 10*3/uL (ref 0.0–0.7)
Eosinophils Relative: 2 %
HCT: 41 % (ref 39.0–52.0)
HEMOGLOBIN: 13.2 g/dL (ref 13.0–17.0)
LYMPHS PCT: 10 %
Lymphs Abs: 0.9 10*3/uL (ref 0.7–4.0)
MCH: 26.8 pg (ref 26.0–34.0)
MCHC: 32.2 g/dL (ref 30.0–36.0)
MCV: 83.3 fL (ref 78.0–100.0)
Monocytes Absolute: 0.6 10*3/uL (ref 0.1–1.0)
Monocytes Relative: 7 %
NEUTROS ABS: 6.8 10*3/uL (ref 1.7–7.7)
NEUTROS PCT: 81 %
PLATELETS: 173 10*3/uL (ref 150–400)
RBC: 4.92 MIL/uL (ref 4.22–5.81)
RDW: 14.9 % (ref 11.5–15.5)
WBC: 8.5 10*3/uL (ref 4.0–10.5)

## 2015-06-13 LAB — BASIC METABOLIC PANEL
ANION GAP: 8 (ref 5–15)
BUN: 13 mg/dL (ref 6–20)
CHLORIDE: 96 mmol/L — AB (ref 101–111)
CO2: 32 mmol/L (ref 22–32)
Calcium: 9.2 mg/dL (ref 8.9–10.3)
Creatinine, Ser: 1.11 mg/dL (ref 0.61–1.24)
GFR calc Af Amer: >60 mL/min (ref >60)
GLUCOSE: 92 mg/dL (ref 65–99)
POTASSIUM: 3.8 mmol/L (ref 3.5–5.1)
Sodium: 136 mmol/L (ref 135–145)

## 2015-06-13 MED ORDER — LIDOCAINE HCL 4 % EX SOLN: CUTANEOUS | Status: DC | PRN

## 2015-06-13 MED ORDER — DIAZEPAM 5 MG PO TABS: 2.5000 mg | ORAL_TABLET | Freq: Every evening | ORAL | Status: DC | PRN

## 2015-06-13 MED ORDER — DIAZEPAM 5 MG PO TABS
5.0000 mg | ORAL_TABLET | Freq: Once | ORAL | Status: AC
  Administered 2015-06-13: 5 mg via ORAL
  Filled 2015-06-13: qty 1

## 2015-06-13 MED ORDER — ACETAMINOPHEN 325 MG PO TABS
650.0000 mg | ORAL_TABLET | Freq: Once | ORAL | Status: AC
  Administered 2015-06-13: 650 mg via ORAL
  Filled 2015-06-13: qty 2

## 2015-06-13 MED ORDER — ACETAMINOPHEN 500 MG PO TABS: 1000.0000 mg | ORAL_TABLET | Freq: Four times a day (QID) | ORAL | Status: DC | PRN

## 2015-06-13 NOTE — Discharge Instructions (Signed)
Heat Therapy  Heat therapy can help ease sore, stiff, injured, and tight muscles and joints. Heat relaxes your muscles, which may help ease your pain.   RISKS AND COMPLICATIONS  If you have any of the following conditions, do not use heat therapy unless your health care provider has approved:   Poor circulation.   Healing wounds or scarred skin in the area being treated.   Diabetes, heart disease, or high blood pressure.   Not being able to feel (numbness) the area being treated.   Unusual swelling of the area being treated.   Active infections.   Blood clots.   Cancer.   Inability to communicate pain. This may include young children and people who have problems with their brain function (dementia).   Pregnancy.  Heat therapy should only be used on old, pre-existing, or long-lasting (chronic) injuries. Do not use heat therapy on new injuries unless directed by your health care provider.  HOW TO USE HEAT THERAPY  There are several different kinds of heat therapy, including:   Moist heat pack.   Warm water bath.   Hot water bottle.   Electric heating pad.   Heated gel pack.   Heated wrap.   Electric heating pad.  Use the heat therapy method suggested by your health care provider. Follow your health care provider's instructions on when and how to use heat therapy.  GENERAL HEAT THERAPY RECOMMENDATIONS   Do not sleep while using heat therapy. Only use heat therapy while you are awake.   Your skin may turn pink while using heat therapy. Do not use heat therapy if your skin turns red.   Do not use heat therapy if you have new pain.   High heat or long exposure to heat can cause burns. Be careful when using heat therapy to avoid burning your skin.   Do not use heat therapy on areas of your skin that are already irritated, such as with a rash or sunburn.  SEEK MEDICAL CARE IF:   You have blisters, redness, swelling, or numbness.   You have new pain.   Your pain is worse.  MAKE SURE  YOU:   Understand these instructions.   Will watch your condition.   Will get help right away if you are not doing well or get worse.     This information is not intended to replace advice given to you by your health care provider. Make sure you discuss any questions you have with your health care provider.     Document Released: 11/03/2011 Document Revised: 09/01/2014 Document Reviewed: 10/04/2013  [employer]vier Interactive Patient Education 2016 [employer]

## 2015-06-13 NOTE — ED Notes (Signed)
Pt. reports headache and right side neck pain onset this afternoon , denies injury , no fever or neck stiffness . Neck pain increases with movement .

## 2015-06-13 NOTE — ED Provider Notes (Signed)
CSN: [id number]     Arrival date & time 06/13/15  2044 History   First MD Initiated Contact with Patient 06/13/15 2117     Chief Complaint  Patient presents with  . Headache  . Neck Pain     (Consider location/radiation/quality/duration/timing/severity/associated sxs/prior Treatment) Patient is a 81 y.o. male presenting with headaches and neck pain. The history is provided by the patient.  Headache Pain location:  R parietal Quality:  Dull Radiates to:  R neck Severity currently:  6/10 Severity at highest:  6/10 Onset quality:  Gradual Duration:  6 hours Timing:  Constant Progression:  Worsening Chronicity:  New Context comment:  Patient was out messing with his driveway all morning and while he was sitting in the chair around 1 PM he started developed pain in his right side of his neck shooting into his head Relieved by:  None tried Worsened by:  Neck movement Ineffective treatments:  None tried Associated symptoms: neck pain and neck stiffness   Associated symptoms: no back pain, no blurred vision, no focal weakness, no numbness, no paresthesias, no syncope, no visual change and no weakness   Neck Pain Pain location:  R side Pain severity:  No pain Timing:  Constant Progression:  Worsening Chronicity:  New Context: not fall and not lifting a heavy object   Relieved by:  Nothing Associated symptoms: headaches   Associated symptoms: no bladder incontinence, no bowel incontinence, no numbness, no syncope, no visual change and no weakness     Past Medical History  Diagnosis Date  . 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 VITAMIN D DEFICIENCY 06/04/2010  . HYPERLIPIDEMIA 04/03/2007  . DEPRESSION 11/30/2009  . HYPERTENSION 04/03/2007  . CORONARY ARTERY DISEASE 10/27/2007  . PERIPHERAL VASCULAR DISEASE 10/27/2007  . SINUSITIS- ACUTE-NOS 10/08/2009  . ALLERGIC RHINITIS 10/27/2007  . CHRONIC OBSTRUCTIVE PULMONARY DISEASE, ACUTE EXACERBATION 11/22/2007  . ASTHMA 10/27/2007  . COPD  10/27/2007  . BENIGN PROSTATIC HYPERTROPHY 10/27/2007  . SHOULDER PAIN, LEFT 06/21/2009  . ANKLE PAIN, RIGHT 11/30/2009  . SCIATICA, LEFT 11/08/2009  . BACK PAIN 10/08/2009  . MYALGIA 02/04/2008  . Pain in Soft Tissues of Limb 02/04/2008  . FATIGUE 11/30/2009  . RASH-NONVESICULAR 10/27/2007  . PERIPHERAL EDEMA 11/16/2009  . Hemoptysis 04/24/2009  . COLONIC POLYPS, HX OF 10/27/2007  . PARESTHESIA 09/02/2010  . Peripheral neuropathy ([place]) 03/08/2012  . Obstructive chronic bronchitis without exacerbation ([place])   . Dementia 01/13/2013   Past Surgical History  Procedure Laterality Date  . None     Family History  Problem Relation Age of Onset  . Hypertension Sister     3 sisters  . Anuerysm Brother     died with  . Stroke Other   . Cancer Other     lung and breast  . Alcohol abuse Other   . Cancer Mother     BREAST  . Cancer Father     LUNG   Social History  Substance Use Topics  . Smoking status: Never Smoker   . Smokeless tobacco: Never Used     Comment: Quit ten years ago.  . Alcohol Use: No    Review of Systems  Eyes: Negative for blurred vision.  Cardiovascular: Negative for syncope.  Gastrointestinal: Negative for bowel incontinence.  Genitourinary: Negative for bladder incontinence.  Musculoskeletal: Positive for neck pain and neck stiffness. Negative for back pain.  Neurological: Positive for headaches. Negative for focal weakness, weakness, numbness and paresthesias.  All other systems reviewed and are negative.  Allergies  Ace inhibitors; Atorvastatin; Penicillins; and Simvastatin  Home Medications   Prior to Admission medications   Medication Sig Start Date End Date Taking? Authorizing Provider  Cholecalciferol (VITAMIN D3) 2000 UNITS capsule Take 2,000 Units by mouth daily.     Historical Provider, MD  donepezil (ARICEPT) 10 MG tablet TAKE 1 TABLET BY MOUTH EVERY NIGHT AT BEDTIME 12/19/14   Biagio Borg, MD  fexofenadine (ALLEGRA) 180 MG tablet Take 1 tablet (180  mg total) by mouth daily. For allergies 02/06/15   Biagio Borg, MD  lovastatin (MEVACOR) 40 MG tablet Take 1 tablet (40 mg total) by mouth daily. 07/25/14   Biagio Borg, MD  NAMENDA XR TITRATION PACK 7 & 14 & 21 &28 MG CP24 Take starter pack as directed Patient not taking: Reported on 02/06/2015 03/14/14   Dennie Bible, NP  omeprazole (PRILOSEC) 20 MG capsule Take 1 capsule (20 mg total) by mouth daily. Patient not taking: Reported on 02/06/2015 03/01/13   Biagio Borg, MD  triamcinolone (NASACORT AQ) 55 MCG/ACT AERO nasal inhaler Place 2 sprays into the nose daily. 02/06/15   Biagio Borg, MD  valsartan-hydrochlorothiazide (DIOVAN HCT) 160-12.5 MG per tablet Take 1 tablet by mouth daily. 07/13/14   Biagio Borg, MD   BP 133/92 mmHg  Pulse 55  Temp(Src) 98 F (36.7 C) (Oral)  Resp 16  Ht 5\' 7"  (1.702 m)  Wt 145 lb (65.772 kg)  BMI 22.71 kg/m2  SpO2 100% Physical Exam  Constitutional: He is oriented to person, place, and time. He appears well-developed and well-nourished. No distress.  HENT:  Head: Normocephalic and atraumatic.  Mouth/Throat: Oropharynx is clear and moist.  Eyes: Conjunctivae and EOM are normal. Pupils are equal, round, and reactive to light.  Neck: Normal range of motion. Neck supple.  Cardiovascular: Normal rate, regular rhythm and intact distal pulses.   No murmur heard. Pulmonary/Chest: Effort normal and breath sounds normal. No respiratory distress. He has no wheezes. He has no rales.  Abdominal: Soft. He exhibits no distension. There is no tenderness. There is no rebound and no guarding.  Musculoskeletal: Normal range of motion. He exhibits no edema or tenderness.  Neurological: He is alert and oriented to person, place, and time.  Skin: Skin is warm and dry. No rash noted. No erythema.  Psychiatric: He has a normal mood and affect. His behavior is normal.  Nursing note and vitals reviewed.   ED Course  Procedures (including critical care time) Labs  Review Labs Reviewed  BASIC METABOLIC PANEL - Abnormal; Notable for the following:    Chloride 96 (*)    All other components within normal limits  CBC WITH DIFFERENTIAL/PLATELET    Imaging Review Ct Head Wo Contrast  06/13/2015  CLINICAL DATA:  RIGHT-sided headache and RIGHT neck pain beginning today. History of hypertension, hyperlipidemia, dementia. EXAM: CT HEAD WITHOUT CONTRAST CT CERVICAL SPINE WITHOUT CONTRAST TECHNIQUE: Multidetector CT imaging of the head and cervical spine was performed following the standard protocol without intravenous contrast. Multiplanar CT image reconstructions of the cervical spine were also generated. COMPARISON:  MRI of the head February 03, 2013 FINDINGS: CT HEAD FINDINGS The ventricles and sulci are normal for age. No intraparenchymal hemorrhage, mass effect nor midline shift. Patchy supratentorial white matter hypodensities are less than expected for patient's age and though non-specific suggest sequelae of chronic small vessel ischemic disease. 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 Old RIGHT caudate head lacunar infarct. No acute large vascular  territory infarcts. No abnormal extra-axial fluid collections. Basal cisterns are patent. Moderate to severe calcific atherosclerosis of the carotid siphons. No skull fracture. The included ocular globes and orbital contents are non-suspicious. RIGHT maxillary sinus air-fluid level, bilateral maxillary and sphenoid antral wall thickening consistent with chronic sinusitis. Moderate LEFT temporomandibular osteoarthrosis. CT CERVICAL SPINE FINDINGS Straightened cervical lordosis. Cervical vertebral bodies are intact and aligned. Mild T2 and mild-to-moderate T3 superior endplate concavity. C1-2 articulation maintained. Moderate to severe C3-4 through C5-6 disc height loss, uncovertebral hypertrophy/endplate spurring compatible with degenerative discs, moderate at C6-7. Moderate to severe LEFT C2-3 facet arthropathy.  Moderate to severe bilateral C3-4, C4-5 neural foraminal narrowing. Bone mineral density decreased without destructive bony lesions. Mild calcific atherosclerosis of the carotid bulbs. Multiple less than 5 mm centrilobular pulmonary nodules, in addition to 9 mm RIGHT apical pulmonary nodule extending up pleural surface with central lucency. 16 mm RIGHT thyroid nodule. IMPRESSION: CT HEAD: No acute intracranial process. Stable chronic changes including old RIGHT caudate head lacunar infarct. CT CERVICAL SPINE: Straightened cervical lordosis without acute fracture or malalignment. Mid age indeterminate mild T2 and mild-to-moderate T3 compression fractures. Multiple apical centrilobular small mid sub solid pulmonary nodules in addition to RIGHT apical 9 mm pulmonary nodule with possible central cavitation. Recommend follow-up CT of the chest on a nonemergent basis. 16 mm RIGHT thyroid nodule for which follow up thyroid sonogram is recommended on a nonemergent basis. Electronically Signed   By: Elon Alas M.D.   On: 06/13/2015 22:54   Ct Cervical Spine Wo Contrast  06/13/2015  CLINICAL DATA:  RIGHT-sided headache and RIGHT neck pain beginning today. History of hypertension, hyperlipidemia, dementia. EXAM: CT HEAD WITHOUT CONTRAST CT CERVICAL SPINE WITHOUT CONTRAST TECHNIQUE: Multidetector CT imaging of the head and cervical spine was performed following the standard protocol without intravenous contrast. Multiplanar CT image reconstructions of the cervical spine were also generated. COMPARISON:  MRI of the head February 03, 2013 FINDINGS: CT HEAD FINDINGS The ventricles and sulci are normal for age. No intraparenchymal hemorrhage, mass effect nor midline shift. Patchy supratentorial white matter hypodensities are less than expected for patient's age and though non-specific suggest sequelae of chronic small vessel ischemic disease. 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 Old RIGHT caudate head lacunar  infarct. No acute large vascular territory infarcts. No abnormal extra-axial fluid collections. Basal cisterns are patent. Moderate to severe calcific atherosclerosis of the carotid siphons. No skull fracture. The included ocular globes and orbital contents are non-suspicious. RIGHT maxillary sinus air-fluid level, bilateral maxillary and sphenoid antral wall thickening consistent with chronic sinusitis. Moderate LEFT temporomandibular osteoarthrosis. CT CERVICAL SPINE FINDINGS Straightened cervical lordosis. Cervical vertebral bodies are intact and aligned. Mild T2 and mild-to-moderate T3 superior endplate concavity. C1-2 articulation maintained. Moderate to severe C3-4 through C5-6 disc height loss, uncovertebral hypertrophy/endplate spurring compatible with degenerative discs, moderate at C6-7. Moderate to severe LEFT C2-3 facet arthropathy. Moderate to severe bilateral C3-4, C4-5 neural foraminal narrowing. Bone mineral density decreased without destructive bony lesions. Mild calcific atherosclerosis of the carotid bulbs. Multiple less than 5 mm centrilobular pulmonary nodules, in addition to 9 mm RIGHT apical pulmonary nodule extending up pleural surface with central lucency. 16 mm RIGHT thyroid nodule. IMPRESSION: CT HEAD: No acute intracranial process. Stable chronic changes including old RIGHT caudate head lacunar infarct. CT CERVICAL SPINE: Straightened cervical lordosis without acute fracture or malalignment. Mid age indeterminate mild T2 and mild-to-moderate T3 compression fractures. Multiple apical centrilobular small mid sub solid pulmonary nodules in addition to  RIGHT apical 9 mm pulmonary nodule with possible central cavitation. Recommend follow-up CT of the chest on a nonemergent basis. 16 mm RIGHT thyroid nodule for which follow up thyroid sonogram is recommended on a nonemergent basis. Electronically Signed   By: Elon Alas M.D.   On: 06/13/2015 22:54   I have personally reviewed and  evaluated these images and lab results as part of my medical decision-making.   EKG Interpretation None      MDM   Final diagnoses:  Spasm of cervical paraspinous muscle   patient presenting today with right-sided neck pain radiating into his head that started around 1 PM this afternoon. Patient states he been out cleaning his concrete driveway all morning but had felt fine. He denies any falls or trauma. No vision changes, numbness or weakness in the upper extremities. He is able to ambulate without difficulty. Patient has not taken anything for the pain. On exam patient has tenderness and spasm over the paracervical muscles on the right. Normal upper extremity strength and sensation. No neurologic deficits on exam.  Imaging is negative for acute issues. After Valium and Tylenol patient is feeling better. We'll discharge home with instructions to avoid strenuous activity and follow-up with his doctor in one week if symptoms persist.    Blanchie Dessert, MD 06/13/15 2337

## 2015-07-30 ENCOUNTER — Other Ambulatory Visit: Payer: Self-pay | Admitting: Internal Medicine

## 2015-07-31 ENCOUNTER — Other Ambulatory Visit: Payer: Self-pay

## 2015-07-31 MED ORDER — VALSARTAN-HYDROCHLOROTHIAZIDE 160-12.5 MG PO TABS: 1.0000 | ORAL_TABLET | Freq: Every day | ORAL | Status: DC

## 2015-08-08 ENCOUNTER — Ambulatory Visit: Payer: Commercial Managed Care - HMO | Admitting: Internal Medicine

## 2015-10-16 ENCOUNTER — Ambulatory Visit (INDEPENDENT_AMBULATORY_CARE_PROVIDER_SITE_OTHER): Payer: Commercial Managed Care - HMO | Admitting: Internal Medicine

## 2015-10-16 ENCOUNTER — Encounter: Payer: Self-pay | Admitting: Internal Medicine

## 2015-10-16 VITALS — BP 138/82 | HR 91 | Temp 98.6°F | Resp 20 | Wt 152.0 lb

## 2015-10-16 DIAGNOSIS — F0390 Unspecified dementia without behavioral disturbance: Secondary | ICD-10-CM | POA: Diagnosis not present

## 2015-10-16 DIAGNOSIS — I10 Essential (primary) hypertension: Secondary | ICD-10-CM

## 2015-10-16 DIAGNOSIS — J449 Chronic obstructive pulmonary disease, unspecified: Secondary | ICD-10-CM

## 2015-10-16 DIAGNOSIS — J069 Acute upper respiratory infection, unspecified: Secondary | ICD-10-CM | POA: Insufficient documentation

## 2015-10-16 MED ORDER — HYDROCODONE-HOMATROPINE 5-1.5 MG/5ML PO SYRP: 5.0000 mL | ORAL_SOLUTION | Freq: Four times a day (QID) | ORAL | Status: DC | PRN

## 2015-10-16 MED ORDER — AZITHROMYCIN 250 MG PO TABS: ORAL_TABLET | ORAL | Status: DC

## 2015-10-16 NOTE — Patient Instructions (Signed)
Please take all new medication as prescribed - the antibiotic, and cough medicine if needed  You can also take Mucinex (or it's generic off brand) for congestion, and tylenol as needed for pain.  Please continue all other medications as before, and refills have been done if requested.  Please have the pharmacy call with any other refills you may need.  Please keep your appointments with your specialists as you may have planned   

## 2015-10-16 NOTE — Progress Notes (Signed)
Pre visit review using our clinic review tool, if applicable. No additional management support is needed unless otherwise documented below in the visit note. 

## 2015-10-16 NOTE — Progress Notes (Signed)
Subjective:    Patient ID: Matthew Gray, male    DOB: 03/19/1934, 82 y.o.   MRN: [id number]  HPI   Here with 2-3 days acute onset fever, facial pain, pressure, headache, general weakness and malaise, and greenish d/c, with mild ST and cough, but pt denies chest pain, wheezing, increased sob or doe, orthopnea, PND, increased LE swelling, palpitations, dizziness or syncope.  Pt denies new neurological symptoms such as new headache, or facial or extremity weakness or numbness   Pt denies polydipsia, polyuria.  Dementia overall stable symptomatically,, and not assoc with behavioral changes such as hallucinations, paranoia, or agitation. Past Medical History  Diagnosis Date  . 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 VITAMIN D DEFICIENCY 06/04/2010  . HYPERLIPIDEMIA 04/03/2007  . DEPRESSION 11/30/2009  . HYPERTENSION 04/03/2007  . CORONARY ARTERY DISEASE 10/27/2007  . PERIPHERAL VASCULAR DISEASE 10/27/2007  . SINUSITIS- ACUTE-NOS 10/08/2009  . ALLERGIC RHINITIS 10/27/2007  . CHRONIC OBSTRUCTIVE PULMONARY DISEASE, ACUTE EXACERBATION 11/22/2007  . ASTHMA 10/27/2007  . COPD 10/27/2007  . BENIGN PROSTATIC HYPERTROPHY 10/27/2007  . SHOULDER PAIN, LEFT 06/21/2009  . ANKLE PAIN, RIGHT 11/30/2009  . SCIATICA, LEFT 11/08/2009  . BACK PAIN 10/08/2009  . MYALGIA 02/04/2008  . Pain in Soft Tissues of Limb 02/04/2008  . FATIGUE 11/30/2009  . RASH-NONVESICULAR 10/27/2007  . PERIPHERAL EDEMA 11/16/2009  . Hemoptysis 04/24/2009  . COLONIC POLYPS, HX OF 10/27/2007  . PARESTHESIA 09/02/2010  . Peripheral neuropathy ([place]) 03/08/2012  . Obstructive chronic bronchitis without exacerbation ([place])   . Dementia 01/13/2013   Past Surgical History  Procedure Laterality Date  . None      reports that he has never smoked. He has never used smokeless tobacco. He reports that he does not drink alcohol or use illicit drugs. family history includes Alcohol abuse in his other; Anuerysm in his brother; Cancer in his father, mother, and other;  Hypertension in his sister; Stroke in his other. Allergies  Allergen Reactions  . Atorvastatin Other (See Comments)    REACTION: myalgias  . Simvastatin Other (See Comments)    REACTION: myalgias  . Ace Inhibitors Other (See Comments)    unknown  . Penicillins Other (See Comments)    unknown   Current Outpatient Prescriptions on File Prior to Visit  Medication Sig Dispense Refill  . acetaminophen (TYLENOL) 500 MG tablet Take 2 tablets (1,000 mg total) by mouth every 6 (six) hours as needed for moderate pain. 30 tablet 0  . Cholecalciferol (VITAMIN D3) 2000 UNITS capsule Take 2,000 Units by mouth daily.     . diazepam (VALIUM) 5 MG tablet Take 0.5 tablets (2.5 mg total) by mouth at bedtime as needed for muscle spasms. 5 tablet 0  . donepezil (ARICEPT) 10 MG tablet Take 1 tablet (10 mg total) by mouth at bedtime. 90 tablet 1  . fexofenadine (ALLEGRA) 180 MG tablet Take 1 tablet (180 mg total) by mouth daily. For allergies 90 tablet 3  . lidocaine (XYLOCAINE) 4 % external solution Apply topically as needed. 50 mL 0  . lovastatin (MEVACOR) 40 MG tablet Take 1 tablet (40 mg total) by mouth daily. 90 tablet 1  . Multiple Vitamin (MULTIVITAMIN WITH MINERALS) TABS tablet Take 1 tablet by mouth daily.    Marland Kitchen NAMENDA XR TITRATION PACK 7 & 14 & 21 &28 MG CP24 Take starter pack as directed 1 capsule 0  . omeprazole (PRILOSEC) 20 MG capsule Take 1 capsule (20 mg total) by mouth daily. 90 capsule 3  . triamcinolone (NASACORT  AQ) 55 MCG/ACT AERO nasal inhaler Place 2 sprays into the nose daily. 1 Inhaler 12  . valsartan-hydrochlorothiazide (DIOVAN HCT) 160-12.5 MG tablet Take 1 tablet by mouth daily. 90 tablet 1   No current facility-administered medications on file prior to visit.   Review of Systems  Constitutional: Negative for unusual diaphoresis or night sweats HENT: Negative for ringing in ear or discharge Eyes: Negative for double vision or worsening visual disturbance.  Respiratory: Negative  for choking and stridor.   Gastrointestinal: Negative for vomiting or other signifcant bowel change Genitourinary: Negative for hematuria or change in urine volume.  Musculoskeletal: Negative for other MSK pain or swelling Skin: Negative for color change and worsening wound.  Neurological: Negative for tremors and numbness other than noted  Psychiatric/Behavioral: Negative for decreased concentration or agitation other than above       Objective:   Physical Exam BP 138/82 mmHg  Pulse 91  Temp(Src) 98.6 F (37 C) (Oral)  Resp 20  Wt 152 lb (68.947 kg)  SpO2 97% VS noted, mild ill Constitutional: Pt appears in no significant distress HENT: Head: NCAT.  Right Ear: External ear normal.  Left Ear: External ear normal.  Eyes: . Pupils are equal, round, and reactive to light. Conjunctivae and EOM are normal Bilat tm's with mild erythema.  Max sinus areas mild tender.  Pharynx with mild erythema, no exudate Neck: Normal range of motion. Neck supple.  Cardiovascular: Normal rate and regular rhythm.   Pulmonary/Chest: Effort normal and breath sounds decreased without rales or wheezing.  Neurological: Pt is alert. At baseline confused , motor grossly intact Skin: Skin is warm. No rash, no LE edema Psychiatric: Pt behavior is normal. No agitation.     Assessment & Plan:

## 2015-10-17 NOTE — Assessment & Plan Note (Signed)
stable overall by history and exam, recent data reviewed with pt, and pt to continue medical treatment as before,  to f/u any worsening symptoms or concerns BP Readings from Last 3 Encounters:  10/16/15 138/82  06/13/15 121/76  02/06/15 122/76

## 2015-10-17 NOTE — Assessment & Plan Note (Signed)
Mild to mod, for antibx course,  to f/u any worsening symptoms or concerns 

## 2015-10-17 NOTE — Assessment & Plan Note (Signed)
stable overall by history and exam, recent data reviewed with pt, and pt to continue medical treatment as before,  to f/u any worsening symptoms or concerns SpO2 Readings from Last 3 Encounters:  10/16/15 97%  06/13/15 100%  02/06/15 96%

## 2015-10-17 NOTE — Assessment & Plan Note (Signed)
stable overall by history and exam, and pt to continue medical treatment as before,  to f/u any worsening symptoms or concerns 

## 2015-11-02 ENCOUNTER — Encounter (HOSPITAL_COMMUNITY): Payer: Self-pay

## 2015-11-02 ENCOUNTER — Emergency Department (HOSPITAL_COMMUNITY)
Admission: EM | Admit: 2015-11-02 | Discharge: 2015-11-03 | Disposition: A | Discharge status: Home / Self Care | Payer: Commercial Managed Care - HMO | Attending: Emergency Medicine | Admitting: Emergency Medicine

## 2015-11-02 ENCOUNTER — Emergency Department (HOSPITAL_COMMUNITY): Payer: Commercial Managed Care - HMO

## 2015-11-02 ENCOUNTER — Ambulatory Visit (INDEPENDENT_AMBULATORY_CARE_PROVIDER_SITE_OTHER): Payer: Commercial Managed Care - HMO

## 2015-11-02 ENCOUNTER — Ambulatory Visit (INDEPENDENT_AMBULATORY_CARE_PROVIDER_SITE_OTHER): Payer: Commercial Managed Care - HMO | Admitting: Family Medicine

## 2015-11-02 VITALS — BP 140/80 | HR 86 | Temp 98.7°F | Resp 16 | Ht 66.5 in | Wt 144.2 lb

## 2015-11-02 DIAGNOSIS — M545 Low back pain, unspecified: Secondary | ICD-10-CM

## 2015-11-02 DIAGNOSIS — M5432 Sciatica, left side: Secondary | ICD-10-CM | POA: Diagnosis not present

## 2015-11-02 DIAGNOSIS — Z87438 Personal history of other diseases of male genital organs: Secondary | ICD-10-CM | POA: Diagnosis not present

## 2015-11-02 DIAGNOSIS — I2510 Atherosclerotic heart disease of native coronary artery without angina pectoris: Secondary | ICD-10-CM | POA: Diagnosis not present

## 2015-11-02 DIAGNOSIS — R109 Unspecified abdominal pain: Secondary | ICD-10-CM

## 2015-11-02 DIAGNOSIS — J189 Pneumonia, unspecified organism: Secondary | ICD-10-CM | POA: Diagnosis not present

## 2015-11-02 DIAGNOSIS — K567 Ileus, unspecified: Secondary | ICD-10-CM

## 2015-11-02 DIAGNOSIS — F0390 Unspecified dementia without behavioral disturbance: Secondary | ICD-10-CM | POA: Diagnosis not present

## 2015-11-02 DIAGNOSIS — J159 Unspecified bacterial pneumonia: Secondary | ICD-10-CM | POA: Insufficient documentation

## 2015-11-02 DIAGNOSIS — E785 Hyperlipidemia, unspecified: Secondary | ICD-10-CM | POA: Diagnosis not present

## 2015-11-02 DIAGNOSIS — K6389 Other specified diseases of intestine: Secondary | ICD-10-CM | POA: Diagnosis not present

## 2015-11-02 DIAGNOSIS — Z880 Allergy status to penicillin: Secondary | ICD-10-CM | POA: Diagnosis not present

## 2015-11-02 DIAGNOSIS — I10 Essential (primary) hypertension: Secondary | ICD-10-CM | POA: Insufficient documentation

## 2015-11-02 DIAGNOSIS — J449 Chronic obstructive pulmonary disease, unspecified: Secondary | ICD-10-CM | POA: Insufficient documentation

## 2015-11-02 DIAGNOSIS — Z8659 Personal history of other mental and behavioral disorders: Secondary | ICD-10-CM | POA: Insufficient documentation

## 2015-11-02 DIAGNOSIS — Z8619 Personal history of other infectious and parasitic diseases: Secondary | ICD-10-CM | POA: Diagnosis not present

## 2015-11-02 DIAGNOSIS — Z86010 Personal history of colonic polyps: Secondary | ICD-10-CM | POA: Insufficient documentation

## 2015-11-02 DIAGNOSIS — K5900 Constipation, unspecified: Secondary | ICD-10-CM | POA: Insufficient documentation

## 2015-11-02 LAB — POCT URINALYSIS DIP (MANUAL ENTRY)
Glucose, UA: NEGATIVE
Leukocytes, UA: NEGATIVE
Nitrite, UA: NEGATIVE
PH UA: 6
Protein Ur, POC: 100 — AB
SPEC GRAV UA: 1.025
UROBILINOGEN UA: 0.2

## 2015-11-02 LAB — POC MICROSCOPIC URINALYSIS (UMFC)

## 2015-11-02 LAB — POCT CBC
GRANULOCYTE PERCENT: 82.9 % — AB (ref 37–80)
HCT, POC: 45.7 % (ref 43.5–53.7)
HEMOGLOBIN: 15.8 g/dL (ref 14.1–18.1)
LYMPH, POC: 1 (ref 0.6–3.4)
MCH, POC: 28 pg (ref 27–31.2)
MCHC: 34.5 g/dL (ref 31.8–35.4)
MCV: 81 fL (ref 80–97)
MID (cbc): 0.7 (ref 0–0.9)
MPV: 7.3 fL (ref 0–99.8)
PLATELET COUNT, POC: 169 10*3/uL (ref 142–424)
POC Granulocyte: 8.2 — AB (ref 2–6.9)
POC LYMPH PERCENT: 10.5 %L (ref 10–50)
POC MID %: 6.6 %M (ref 0–12)
RBC: 5.64 M/uL (ref 4.69–6.13)
RDW, POC: 14.8 %
WBC: 9.9 10*3/uL (ref 4.6–10.2)

## 2015-11-02 LAB — I-STAT CHEM 8, ED
BUN: 18 mg/dL (ref 6–20)
CALCIUM ION: 1.11 mmol/L — AB (ref 1.13–1.30)
CREATININE: 1 mg/dL (ref 0.61–1.24)
Chloride: 97 mmol/L — ABNORMAL LOW (ref 101–111)
GLUCOSE: 89 mg/dL (ref 65–99)
HCT: 49 % (ref 39.0–52.0)
Hemoglobin: 16.7 g/dL (ref 13.0–17.0)
POTASSIUM: 3.8 mmol/L (ref 3.5–5.1)
Sodium: 141 mmol/L (ref 135–145)
TCO2: 31 mmol/L (ref 0–100)

## 2015-11-02 LAB — HEPATIC FUNCTION PANEL
ALK PHOS: 58 U/L (ref 38–126)
ALT: 17 U/L (ref 17–63)
AST: 22 U/L (ref 15–41)
Albumin: 4.1 g/dL (ref 3.5–5.0)
BILIRUBIN DIRECT: 0.2 mg/dL (ref 0.1–0.5)
BILIRUBIN TOTAL: 1.3 mg/dL — AB (ref 0.3–1.2)
Indirect Bilirubin: 1.1 mg/dL — ABNORMAL HIGH (ref 0.3–0.9)
Total Protein: 7.8 g/dL (ref 6.5–8.1)

## 2015-11-02 LAB — CBC WITH DIFFERENTIAL/PLATELET
BASOS ABS: 0 10*3/uL (ref 0.0–0.1)
Basophils Relative: 0 %
EOS PCT: 1 %
Eosinophils Absolute: 0.1 10*3/uL (ref 0.0–0.7)
HEMATOCRIT: 44.7 % (ref 39.0–52.0)
Hemoglobin: 15.2 g/dL (ref 13.0–17.0)
LYMPHS ABS: 0.9 10*3/uL (ref 0.7–4.0)
LYMPHS PCT: 9 %
MCH: 27.5 pg (ref 26.0–34.0)
MCHC: 34 g/dL (ref 30.0–36.0)
MCV: 81 fL (ref 78.0–100.0)
Monocytes Absolute: 0.6 10*3/uL (ref 0.1–1.0)
Monocytes Relative: 6 %
NEUTROS ABS: 8.3 10*3/uL — AB (ref 1.7–7.7)
Neutrophils Relative %: 84 %
PLATELETS: 149 10*3/uL — AB (ref 150–400)
RBC: 5.52 MIL/uL (ref 4.22–5.81)
RDW: 14.5 % (ref 11.5–15.5)
WBC: 9.9 10*3/uL (ref 4.0–10.5)

## 2015-11-02 LAB — TROPONIN I

## 2015-11-02 LAB — POC OCCULT BLOOD, ED: Fecal Occult Bld: NEGATIVE

## 2015-11-02 MED ORDER — SODIUM CHLORIDE 0.9 % IV BOLUS (SEPSIS)
1000.0000 mL | Freq: Once | INTRAVENOUS | Status: AC
  Administered 2015-11-02: 1000 mL via INTRAVENOUS

## 2015-11-02 MED ORDER — IOHEXOL 300 MG/ML  SOLN
100.0000 mL | Freq: Once | INTRAMUSCULAR | Status: AC | PRN
  Administered 2015-11-02: 100 mL via INTRAVENOUS

## 2015-11-02 NOTE — Progress Notes (Addendum)
Subjective:  By signing my name below, I, Matthew Gray, attest that this documentation has been prepared under the direction and in the presence of Matthew Ray, MD.  Electronically Signed: [name], ED Scribe. 11/02/2015. 3:35 PM.    Patient ID: Matthew Gray, male    DOB: August 13, 1934, 82 y.o.   MRN: [id number]  HPI Chief Complaint  Patient presents with  . Back Pain    x 1 day  . Abdominal Pain    x 1 day    HPI Comments: Matthew Gray is a 82 y.o. male who presents to the [facility] complaining of back pain and abdominal that began 1 day. Hx of multiple med problems including CAD, PVD, COPD and a reported hx dementia he is on about 7 pills a day but is unable to specify. PCP is Matthew Cower, MD.  Pt states pain starts in his abdomen and wraps around to his back. Pt last urinated this morning and had BM yesterday. Pt denies hx of abdominal surgery. No hx of back or abdominal pain or injury.  Pt denies fever, chills vomiting, diarrhea, hematuria, blood in stool, light headedness and dizziness.      Patient Active Problem List   Diagnosis Date Noted  . Acute upper respiratory infection 10/16/2015  . Cough 02/06/2015  . Weakness 07/19/2014  . Right inguinal hernia 03/04/2013  . Dementia 01/13/2013  . Obstructive chronic bronchitis without exacerbation ([place])   . Polyneuropathy in other diseases classified elsewhere ([place])   . PVD (peripheral vascular disease) ([place]) 05/04/2012  . Peripheral neuropathy ([place]) 03/08/2012  . Bilateral leg pain 03/08/2012  . Peripheral edema 09/10/2011  . Preventative health care 11/22/2010  . Weight loss 11/22/2010  . Anxiety 11/22/2010  . PARESTHESIA 09/02/2010  . VITAMIN D DEFICIENCY 06/04/2010  . DEPRESSION 11/30/2009  . ANKLE PAIN, RIGHT 11/30/2009  . FATIGUE 11/30/2009  . PERIPHERAL EDEMA 11/16/2009  . SCIATICA, LEFT 11/08/2009  . BACK PAIN 10/08/2009  . SHOULDER PAIN, LEFT 06/21/2009  . Hemoptysis 04/24/2009  .  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 MYALGIA 02/04/2008  . Pain in Soft Tissues of Limb 02/04/2008  . CORONARY ARTERY DISEASE 10/27/2007  . PERIPHERAL VASCULAR DISEASE 10/27/2007  . Allergic rhinitis 10/27/2007  . ASTHMA 10/27/2007  . COPD (chronic obstructive pulmonary disease) ([place]) 10/27/2007  . BENIGN PROSTATIC HYPERTROPHY 10/27/2007  . RASH-NONVESICULAR 10/27/2007  . COLONIC POLYPS, HX OF 10/27/2007  . HYPERLIPIDEMIA 04/03/2007  . Essential hypertension 04/03/2007   Past Medical History  Diagnosis Date  . 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 VITAMIN D DEFICIENCY 06/04/2010  . HYPERLIPIDEMIA 04/03/2007  . DEPRESSION 11/30/2009  . HYPERTENSION 04/03/2007  . CORONARY ARTERY DISEASE 10/27/2007  . PERIPHERAL VASCULAR DISEASE 10/27/2007  . SINUSITIS- ACUTE-NOS 10/08/2009  . ALLERGIC RHINITIS 10/27/2007  . CHRONIC OBSTRUCTIVE PULMONARY DISEASE, ACUTE EXACERBATION 11/22/2007  . ASTHMA 10/27/2007  . COPD 10/27/2007  . BENIGN PROSTATIC HYPERTROPHY 10/27/2007  . SHOULDER PAIN, LEFT 06/21/2009  . ANKLE PAIN, RIGHT 11/30/2009  . SCIATICA, LEFT 11/08/2009  . BACK PAIN 10/08/2009  . MYALGIA 02/04/2008  . Pain in Soft Tissues of Limb 02/04/2008  . FATIGUE 11/30/2009  . RASH-NONVESICULAR 10/27/2007  . PERIPHERAL EDEMA 11/16/2009  . Hemoptysis 04/24/2009  . COLONIC POLYPS, HX OF 10/27/2007  . PARESTHESIA 09/02/2010  . Peripheral neuropathy ([place]) 03/08/2012  . Obstructive chronic bronchitis without exacerbation ([place])   . Dementia 01/13/2013   Past Surgical History  Procedure Laterality Date  . None     Allergies  Allergen Reactions  . Atorvastatin Other (See Comments)    REACTION: myalgias  . Simvastatin Other (See Comments)    REACTION: myalgias  . Ace Inhibitors Other (See Comments)    unknown  . Penicillins Other (See Comments)    unknown   Prior to Admission medications   Medication Sig Start Date End Date Taking? Authorizing Provider  acetaminophen (TYLENOL) 500 MG tablet Take 2 tablets  (1,000 mg total) by mouth every 6 (six) hours as needed for moderate pain. 06/13/15  Yes Blanchie Dessert, MD  fexofenadine (ALLEGRA) 180 MG tablet Take 1 tablet (180 mg total) by mouth daily. For allergies 02/06/15  Yes Biagio Borg, MD  Multiple Vitamin (MULTIVITAMIN WITH MINERALS) TABS tablet Take 1 tablet by mouth daily.   Yes Historical Provider, MD  valsartan-hydrochlorothiazide (DIOVAN HCT) 160-12.5 MG tablet Take 1 tablet by mouth daily. 07/31/15  Yes Biagio Borg, MD  azithromycin (ZITHROMAX Z-PAK) 250 MG tablet Use as directed Patient not taking: Reported on 11/02/2015 10/16/15   Biagio Borg, MD  Cholecalciferol (VITAMIN D3) 2000 UNITS capsule Take 2,000 Units by mouth daily. Reported on 11/02/2015    Historical Provider, MD  diazepam (VALIUM) 5 MG tablet Take 0.5 tablets (2.5 mg total) by mouth at bedtime as needed for muscle spasms. Patient not taking: Reported on 11/02/2015 06/13/15   Blanchie Dessert, MD  donepezil (ARICEPT) 10 MG tablet Take 1 tablet (10 mg total) by mouth at bedtime. Patient not taking: Reported on 11/02/2015 07/31/15   Biagio Borg, MD  HYDROcodone-homatropine [facility]) 5-1.5 MG/5ML syrup Take 5 mLs by mouth every 6 (six) hours as needed for cough. Patient not taking: Reported on 11/02/2015 10/16/15   Biagio Borg, MD  lidocaine (XYLOCAINE) 4 % external solution Apply topically as needed. Patient not taking: Reported on 11/02/2015 06/13/15   Blanchie Dessert, MD  lovastatin (MEVACOR) 40 MG tablet Take 1 tablet (40 mg total) by mouth daily. Patient not taking: Reported on 11/02/2015 07/31/15   Biagio Borg, MD  NAMENDA XR TITRATION PACK 7 & 14 & 21 &28 MG CP24 Take starter pack as directed Patient not taking: Reported on 11/02/2015 03/14/14   Dennie Bible, NP  omeprazole (PRILOSEC) 20 MG capsule Take 1 capsule (20 mg total) by mouth daily. Patient not taking: Reported on 11/02/2015 03/01/13   Biagio Borg, MD  triamcinolone (NASACORT AQ) 55 MCG/ACT AERO nasal inhaler  Place 2 sprays into the nose daily. Patient not taking: Reported on 11/02/2015 02/06/15   Biagio Borg, MD   Social History   Social History  . Marital Status: Divorced    Spouse Name: N/A  . Number of Children: 3  . Years of Education: 12   Occupational History  .     Social History Main Topics  . Smoking status: Never Smoker   . Smokeless tobacco: Never Used     Comment: Quit ten years ago.  . Alcohol Use: No  . Drug Use: No  . Sexual Activity: Not on file   Other Topics Concern  . Not on file   Social History Narrative   He had 12 years of education, retired from [employer] at age 35, lives alone for 25 years, has 3 children   Patient right handed.   Caffeine-  Coffee three times a week.   Review of Systems  Constitutional: Negative for fever and chills.  Gastrointestinal: Positive for abdominal pain. Negative for nausea, vomiting, diarrhea and constipation.  Genitourinary: Negative for  hematuria and difficulty urinating.  Musculoskeletal: Positive for myalgias and back pain.  Neurological: Negative for dizziness and light-headedness.   Objective:   Physical Exam  Constitutional: He is oriented to person, place, and time. He appears well-developed and well-nourished. No distress.  HENT:  Head: Normocephalic and atraumatic.  Eyes: Conjunctivae and EOM are normal.  Neck: Neck supple.  Cardiovascular: Normal rate.   Pulmonary/Chest: Effort normal.  Abdominal: There is tenderness in the left lower quadrant.  Abdomen flat. No rash.,Diffuse lower abdominal pain left worse than right.   Musculoskeletal: Normal range of motion.  Complains of discomfort in lower lumbar spine.  Pt in lower para spinal muscles of the mid lumbar spine.   Neurological: He is alert and oriented to person, place, and time.  Skin: Skin is warm and dry.  Psychiatric: He has a normal mood and affect. His behavior is normal.  Nursing note and vitals reviewed.  Filed Vitals:   11/02/15 1408  BP:  140/80  Pulse: 86  Temp: 98.7 F (37.1 C)  TempSrc: Oral  Resp: 16  Height: 5' 6.5" (1.689 m)  Weight: 144 lb 3.2 oz (65.409 kg)  SpO2: 96%    Results for orders placed or performed in visit on 11/02/15  POCT CBC  Result Value Ref Range   WBC 9.9 4.6 - 10.2 K/uL   Lymph, poc 1.0 0.6 - 3.4   POC LYMPH PERCENT 10.5 10 - 50 %L   MID (cbc) 0.7 0 - 0.9   POC MID % 6.6 0 - 12 %M   POC Granulocyte 8.2 (A) 2 - 6.9   Granulocyte percent 82.9 (A) 37 - 80 %G   RBC 5.64 4.69 - 6.13 M/uL   Hemoglobin 15.8 14.1 - 18.1 g/dL   HCT, POC 45.7 43.5 - 53.7 %   MCV 81.0 80 - 97 fL   MCH, POC 28.0 27 - 31.2 pg   MCHC 34.5 31.8 - 35.4 g/dL   RDW, POC 14.8 %   Platelet Count, POC 169 142 - 424 K/uL   MPV 7.3 0 - 99.8 fL  POCT urinalysis dipstick  Result Value Ref Range   Color, UA orange (A) yellow   Clarity, UA clear clear   Glucose, UA negative negative   Bilirubin, UA Gray (A) negative   Ketones, POC UA Gray (15) (A) negative   Spec Grav, UA 1.025    Blood, UA Gray (A) negative   pH, UA 6.0    Protein Ur, POC =100 (A) negative   Urobilinogen, UA 0.2    Nitrite, UA Negative Negative   Leukocytes, UA Negative Negative  POCT Microscopic Urinalysis ([facility])  Result Value Ref Range   WBC,UR,HPF,POC Few (A) None WBC/hpf   RBC,UR,HPF,POC Few (A) None RBC/hpf   Bacteria None None, Too numerous to count   Mucus Present (A) Absent   Epithelial Cells, UR Per Microscopy Few (A) None, Too numerous to count cells/hpf   Dg Abd Acute W/chest  11/02/2015  CLINICAL DATA:  Abdominal pain radiating to the back beginning yesterday. History of asthma -COPD EXAM: DG ABDOMEN ACUTE W/ 1V CHEST COMPARISON:  PA and lateral chest x-Gray of November 18, 2010 FINDINGS: The lungs remain hyperinflated. There is no focal infiltrate. There is no pleural effusion. The heart is normal in size. The pulmonary vascularity is not engorged. Peak there is mild tortuosity of the descending thoracic aorta. The bony thorax exhibits  no acute abnormality. Within the abdomen there is a moderate volume of stool  within loops of sigmoid colon. There a few distended gas-filled loops of bowel in the left upper quadrant and may reflect Gray bowel. There is a large amount of stool in the rectum. The lumbar spine and bony pelvis exhibit no acute abnormalities were visualized. No definite calcified kidney stones are observed. IMPRESSION: 1. COPD.  There is no acute cardiopulmonary abnormality. 2. Increased colonic stool burden principally in the sigmoid colon and rectum. This is consistent with constipation in the appropriate clinical setting. There are loops of moderately distended Gray bowel in the left upper quadrant which may indicate Gray bowel ileus or partial distal Gray bowel obstruction. Electronically Signed   By: Jazon  [place] M.D.   On: 11/02/2015 16:37     Assessment & Plan:  Gurjot Peardon is a 82 y.o. male Abdominal pain, unspecified abdominal location - Plan: POCT CBC, DG Abd Acute W/Chest, POCT urinalysis dipstick, POCT Microscopic Urinalysis ([facility])  Midline low back pain without sciatica  Ileus ([place])  Possible early Gray bowel obstruction versus ileus with increased colonic stool burden. Acute onset of pain since yesterday afternoon, increasing pain today. Pain radiating to the back. No nausea or vomiting, no fever, trace blood in the urine and a few white blood cells/red blood cells, but no urinary symptoms. With possible SBO, we'll have evaluated further through the [facility] emergency room.  Nursing staff advised.   No orders of the defined types were placed in this encounter.   Patient Instructions  IF you received an x-Gray today, you will receive an invoice from [facility]. Please contact [facility] at [phone] with questions or concerns regarding your invoice.   IF you received labwork today, you will receive an invoice from [employer]. Please contact  [facility] at [phone] with questions or concerns regarding your invoice.   Our billing staff will not be able to assist you with questions regarding bills from these companies.  You will be contacted with the lab results as soon as they are available. The fastest way to get your results is to activate your My Chart account. Instructions are located on the last page of this paperwork. If you have not heard from [place] regarding the results in 2 weeks, please contact this office.

## 2015-11-02 NOTE — ED Notes (Signed)
Patient c/o abdominal pain and back pain.that began today. Patient went to [facility] and was instructed to come to the ED for early SBO. Patient denies  N/V/D. Patient states he had a BM yesterday, but was very small amount.

## 2015-11-02 NOTE — Patient Instructions (Addendum)
IF you received an x-ray today, you will receive an invoice from [facility]. Please contact [facility] at [phone] with questions or concerns regarding your invoice.   IF you received labwork today, you will receive an invoice from [facility] [employer]. Please contact [facility] at [phone] with questions or concerns regarding your invoice.   Our billing staff will not be able to assist you with questions regarding bills from these companies.  You will be contacted with the lab results as soon as they are available. The fastest way to get your results is to activate your My Chart account. Instructions are located on the last page of this paperwork. If you have not heard from [place] regarding the results in 2 weeks, please contact this office.  You may have a possible early small bowel obstruction or ileus from increased stool. After leaving our office, be evaluated further through [facility] emergency room. I advised the nursing staff that you're on your way.  If follow-up needed after the emergency room, can return here for further evaluation or your primary care provider.

## 2015-11-02 NOTE — ED Provider Notes (Signed)
CSN: [id number]     Arrival date & time 11/02/15  1812 History   First MD Initiated Contact with Patient 11/02/15 1909     Chief Complaint  Patient presents with  . SBO      (Consider location/radiation/quality/duration/timing/severity/associated sxs/prior Treatment) HPI   82 year old demented male with history of colonic polyps,peripheral vascular disease, COPD sent here from urgent care for concerns of small bowel obstruction. Patient report for the past 3-4 days he has had generalized fatigue, feeling weak. He also complaining of pain to his mid abdomen that wraps around to his back. Pain is mild to moderate in intensity. He has a bowel movement yesterday but it was a small amount. He also feels like his balance is off. He denies having any fever, chills, nausea, vomiting, diarrhea, dysuria, hematuria, hematochezia or melena. No prior abdominal surgery. No chest pain or shortness of breath and no increasing cough. Denies any URI symptoms. He was seen at urgent care earlier today. An acute abdominal series was performed showing questionable early signs of serious versus small bowel obstruction and evidence to suggest constipation. He was sent here for further evaluation.currently patient denies any significant pain. No complaints of focal numbness or weakness. patient lives at home by himself. His daughter who is power of attorney is at bedside.   Past Medical History  Diagnosis Date  . 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 VITAMIN D DEFICIENCY 06/04/2010  . HYPERLIPIDEMIA 04/03/2007  . DEPRESSION 11/30/2009  . HYPERTENSION 04/03/2007  . CORONARY ARTERY DISEASE 10/27/2007  . PERIPHERAL VASCULAR DISEASE 10/27/2007  . SINUSITIS- ACUTE-NOS 10/08/2009  . ALLERGIC RHINITIS 10/27/2007  . CHRONIC OBSTRUCTIVE PULMONARY DISEASE, ACUTE EXACERBATION 11/22/2007  . ASTHMA 10/27/2007  . COPD 10/27/2007  . BENIGN PROSTATIC HYPERTROPHY 10/27/2007  . SHOULDER PAIN, LEFT 06/21/2009  . ANKLE PAIN, RIGHT 11/30/2009  .  SCIATICA, LEFT 11/08/2009  . BACK PAIN 10/08/2009  . MYALGIA 02/04/2008  . Pain in Soft Tissues of Limb 02/04/2008  . FATIGUE 11/30/2009  . RASH-NONVESICULAR 10/27/2007  . PERIPHERAL EDEMA 11/16/2009  . Hemoptysis 04/24/2009  . COLONIC POLYPS, HX OF 10/27/2007  . PARESTHESIA 09/02/2010  . Peripheral neuropathy ([place]) 03/08/2012  . Obstructive chronic bronchitis without exacerbation ([place])   . Dementia 01/13/2013   Past Surgical History  Procedure Laterality Date  . None     Family History  Problem Relation Age of Onset  . Hypertension Sister     3 sisters  . Anuerysm Brother     died with  . Stroke Other   . Cancer Other     lung and breast  . Alcohol abuse Other   . Cancer Mother     BREAST  . Cancer Father     LUNG   Social History  Substance Use Topics  . Smoking status: Never Smoker   . Smokeless tobacco: Never Used     Comment: Quit ten years ago.  . Alcohol Use: No    Review of Systems  All other systems reviewed and are negative.     Allergies  Atorvastatin; Simvastatin; Ace inhibitors; and Penicillins  Home Medications   Prior to Admission medications   Medication Sig Start Date End Date Taking? Authorizing Provider  acetaminophen (TYLENOL) 500 MG tablet Take 2 tablets (1,000 mg total) by mouth every 6 (six) hours as needed for moderate pain. 06/13/15  Yes Blanchie Dessert, MD  fexofenadine (ALLEGRA) 180 MG tablet Take 1 tablet (180 mg total) by mouth daily. For allergies 02/06/15  Yes Biagio Borg,  MD  lovastatin (MEVACOR) 40 MG tablet Take 1 tablet (40 mg total) by mouth daily. 07/31/15  Yes Biagio Borg, MD  valsartan-hydrochlorothiazide (DIOVAN HCT) 160-12.5 MG tablet Take 1 tablet by mouth daily. 07/31/15  Yes Biagio Borg, MD  diazepam (VALIUM) 5 MG tablet Take 0.5 tablets (2.5 mg total) by mouth at bedtime as needed for muscle spasms. Patient not taking: Reported on 11/02/2015 06/13/15   Blanchie Dessert, MD  donepezil (ARICEPT) 10 MG tablet Take 1 tablet (10 mg  total) by mouth at bedtime. Patient not taking: Reported on 11/02/2015 07/31/15   Biagio Borg, MD  HYDROcodone-homatropine [facility]) 5-1.5 MG/5ML syrup Take 5 mLs by mouth every 6 (six) hours as needed for cough. Patient not taking: Reported on 11/02/2015 10/16/15   Biagio Borg, MD  lidocaine (XYLOCAINE) 4 % external solution Apply topically as needed. Patient not taking: Reported on 11/02/2015 06/13/15   Blanchie Dessert, MD  NAMENDA XR TITRATION PACK 7 & 14 & 21 &28 MG CP24 Take starter pack as directed Patient not taking: Reported on 11/02/2015 03/14/14   Dennie Bible, NP  omeprazole (PRILOSEC) 20 MG capsule Take 1 capsule (20 mg total) by mouth daily. Patient not taking: Reported on 11/02/2015 03/01/13   Biagio Borg, MD  triamcinolone (NASACORT AQ) 55 MCG/ACT AERO nasal inhaler Place 2 sprays into the nose daily. Patient not taking: Reported on 11/02/2015 02/06/15   Biagio Borg, MD   BP 169/109 mmHg  Pulse 93  Temp(Src) 97.7 F (36.5 C) (Oral)  Resp 27  SpO2 97% Physical Exam  Constitutional: He appears well-developed and well-nourished. No distress.  African-American male laying in bed in no acute discomfort, nontoxic in appearance  HENT:  Head: Atraumatic.  Eyes: Conjunctivae are normal.  Neck: Neck supple.  Cardiovascular: Normal rate and regular rhythm.   Pulmonary/Chest: Effort normal and breath sounds normal. No respiratory distress. He has no wheezes. He has no rales.  Abdominal: Soft. There is tenderness (mild periumbilical  tenderness and tenderness to left lower quadrant on palpation without guarding or rebound tenderness. No hernia noted.).  Genitourinary:  Chaperone present during exam. Normal rectal tone, no thrombosed hemorrhoid, no mass, no stools in rectal vault, no signs of bleeding.  Neurological: He is alert.  Skin: No rash noted.  Psychiatric: He has a normal mood and affect.  Nursing note and vitals reviewed.   ED Course  Procedures (including critical  care time) Labs Review Labs Reviewed  CBC WITH DIFFERENTIAL/PLATELET - Abnormal; Notable for the following:    Platelets 149 (*)    Neutro Abs 8.3 (*)    All other components within normal limits  HEPATIC FUNCTION PANEL - Abnormal; Notable for the following:    Total Bilirubin 1.3 (*)    Indirect Bilirubin 1.1 (*)    All other components within normal limits  I-STAT CHEM 8, ED - Abnormal; Notable for the following:    Chloride 97 (*)    Calcium, Ion 1.11 (*)    All other components within normal limits  TROPONIN I  POC OCCULT BLOOD, ED     Results for orders placed or performed in visit on 11/02/15  POCT CBC  Result Value Ref Range   WBC 9.9 4.6 - 10.2 K/uL   Lymph, poc 1.0 0.6 - 3.4   POC LYMPH PERCENT 10.5 10 - 50 %L   MID (cbc) 0.7 0 - 0.9   POC MID % 6.6 0 - 12 %M   POC  Granulocyte 8.2 (A) 2 - 6.9   Granulocyte percent 82.9 (A) 37 - 80 %G   RBC 5.64 4.69 - 6.13 M/uL   Hemoglobin 15.8 14.1 - 18.1 g/dL   HCT, POC 45.7 43.5 - 53.7 %   MCV 81.0 80 - 97 fL   MCH, POC 28.0 27 - 31.2 pg   MCHC 34.5 31.8 - 35.4 g/dL   RDW, POC 14.8 %   Platelet Count, POC 169 142 - 424 K/uL   MPV 7.3 0 - 99.8 fL  POCT urinalysis dipstick  Result Value Ref Range   Color, UA orange (A) yellow   Clarity, UA clear clear   Glucose, UA negative negative   Bilirubin, UA small (A) negative   Ketones, POC UA small (15) (A) negative   Spec Grav, UA 1.025    Blood, UA small (A) negative   pH, UA 6.0    Protein Ur, POC =100 (A) negative   Urobilinogen, UA 0.2    Nitrite, UA Negative Negative   Leukocytes, UA Negative Negative  POCT Microscopic Urinalysis ([facility])  Result Value Ref Range   WBC,UR,HPF,POC Few (A) None WBC/hpf   RBC,UR,HPF,POC Few (A) None RBC/hpf   Bacteria None None, Too numerous to count   Mucus Present (A) Absent   Epithelial Cells, UR  Per Microscopy Few (A) None, Too numerous to count cells/hpf        Imaging Review Dg Abd Acute W/chest  11/02/2015  CLINICAL DATA:  Abdominal pain radiating to the back beginning yesterday. History of asthma -COPD EXAM: DG ABDOMEN ACUTE W/ 1V CHEST COMPARISON:  PA and lateral chest x-ray of November 18, 2010 FINDINGS: The lungs remain hyperinflated. There is no focal infiltrate. There is no pleural effusion. The heart is normal in size. The pulmonary vascularity is not engorged. Peak there is mild tortuosity of the descending thoracic aorta. The bony thorax exhibits no acute abnormality. Within the abdomen there is a moderate volume of stool within loops of sigmoid colon. There a few distended gas-filled loops of bowel in the left upper quadrant and may reflect small bowel. There is a large amount of stool in the rectum. The lumbar spine and bony pelvis exhibit no acute abnormalities were visualized. No definite calcified kidney stones are observed. IMPRESSION: 1. COPD.  There is no acute cardiopulmonary abnormality. 2. Increased colonic stool burden principally in the sigmoid colon and rectum. This is consistent with constipation in the appropriate clinical setting. There are loops of moderately distended small bowel in the left upper quadrant which may indicate small bowel ileus or partial distal small bowel obstruction. Electronically Signed   By: Karell  Martinique M.D.   On: 11/02/2015 16:37   I have personally reviewed and evaluated these images and lab results as part of my medical decision-making.   EKG Interpretation   Date/Time:  Friday November 02 2015 18:25:21 EST Ventricular Rate:  82 PR Interval:  164 QRS Duration: 148 QT Interval:  414 QTC Calculation: 483 R Axis:   116 Text Interpretation:  Sinus rhythm Right bundle branch block LVH by  voltage Borderline prolonged QT interval agree. no STEMI. Increased IVCD  c/w old Confirmed by Johnney Killian, MD, Jeannie Done [phone]) on 11/02/2015 7:10:33 PM       MDM   Final diagnoses:  CAP (community acquired pneumonia)  Constipation, unspecified constipation type    BP 188/113 mmHg  Pulse 88  Temp(Src) 98 F (36.7 C) (Oral)  Resp 18  SpO2 97% The patient was noted to  be hypertensive today in the emergency department. I have spoken with the patient regarding hypertension and the need for improved management. I instructed the patient to followup with the Primary care doctor within 4 days to improve the management of the patient's hypertension. I also counseled the patient regarding the signs and symptoms which would require an emergent visit to an emergency department for hypertensive urgency and/or hypertensive emergency. The patient understood the need for improved hypertensive management.   7:34 PM Patient here with generalized weakness and having abdominal pain with an acute abdominal series showing questionable ileus versus small bowel obstruction. I will follow-up with a CT scan for further evaluation.  12:31 AM Abd/pelvis CT scan showing no evidence of SBO.  Evidence of constipation were noted.  There are focal opacities noted to the lower lobes of lungs concerning for multifocal pneumonia/bronchilitis.  Pt did cough several times while I was in the room.  He was taking zpak last week for a cold.  He is not febrile, no hypoxia and pt sts his cough is not worsen.  Therefore, I will prescribe doxycycline as treatment for his CAP (no recent hospitalization) but i strongly encourage f/u with pcp and have a repeat CXR in 1 week to ensure resolution.  Strict return precaution discussed if his condition worsen.  Pt voice understanding and agrees with plan.  Family members at bedside also agrees with plan.    Domenic Moras, PA-C 11/03/15 [id number]  Forde Dandy, MD 11/03/15 [phone]

## 2015-11-03 MED ORDER — DOXYCYCLINE HYCLATE 100 MG PO TABS
100.0000 mg | ORAL_TABLET | Freq: Once | ORAL | Status: AC
  Administered 2015-11-03: 100 mg via ORAL
  Filled 2015-11-03: qty 1

## 2015-11-03 MED ORDER — BENZONATATE 100 MG PO CAPS: 100.0000 mg | ORAL_CAPSULE | Freq: Three times a day (TID) | ORAL | Status: DC

## 2015-11-03 MED ORDER — DOXYCYCLINE HYCLATE 100 MG PO CAPS: 100.0000 mg | ORAL_CAPSULE | Freq: Two times a day (BID) | ORAL | Status: DC

## 2015-11-03 MED ORDER — POLYETHYLENE GLYCOL 3350 17 G PO PACK: 17.0000 g | PACK | Freq: Every day | ORAL | Status: DC

## 2015-11-03 NOTE — Discharge Instructions (Signed)
Please take antibiotic for the full duration as treatment of your pneumonia.  Follow up with your doctor in 1 week for a repeat chest xray to ensure improvement of your infection.  Take miralax to help with constipation.  Take tessalon as needed for cough.  Return to the ER if your condition worsen or if you have other concerns.   Community-Acquired Pneumonia, Adult Pneumonia is an infection of the lungs. One type of pneumonia can happen while a person is in a hospital. A different type can happen when a person is not in a hospital (community-acquired pneumonia). It is easy for this kind to spread from person to person. It can spread to you if you breathe near an infected person who coughs or sneezes. Some symptoms include:  A dry cough.  A wet (productive) cough.  Fever.  Sweating.  Chest pain. HOME CARE  Take over-the-counter and prescription medicines only as told by your doctor.  Only take cough medicine if you are losing sleep.  If you were prescribed an antibiotic medicine, take it as told by your doctor. Do not stop taking the antibiotic even if you start to feel better.  Sleep with your head and neck raised (elevated). You can do this by putting a few pillows under your head, or you can sleep in a recliner.  Do not use tobacco products. These include cigarettes, chewing tobacco, and e-cigarettes. If you need help quitting, ask your doctor.  Drink enough water to keep your pee (urine) clear or pale yellow. A shot (vaccine) can help prevent pneumonia. Shots are often suggested for:  People older than 80 years of age.  People older than 80 years of age:  Who are having cancer treatment.  Who have long-term (chronic) lung disease.  Who have problems with their body's defense system (immune system). You may also prevent pneumonia if you take these actions:  Get the flu (influenza) shot every year.  Go to the dentist as often as told.  Wash your hands often. If soap and  water are not available, use hand sanitizer. GET HELP IF:  You have a fever.  You lose sleep because your cough medicine does not help. GET HELP RIGHT AWAY IF:  You are short of breath and it gets worse.  You have more chest pain.  Your sickness gets worse. This is very serious if:  You are an older adult.  Your body's defense system is weak.  You cough up blood.   This information is not intended to replace advice given to you by your health care provider. Make sure you discuss any questions you have with your health care provider.   Document Released: 01/28/2008 Document Revised: 05/02/2015 Document Reviewed: 12/06/2014 Elsevier Interactive Patient Education 2016 [employer].  Constipation, Adult Constipation is when a person has fewer than three bowel movements a week, has difficulty having a bowel movement, or has stools that are dry, hard, or larger than normal. As people grow older, constipation is more common. A low-fiber diet, not taking in enough fluids, and taking certain medicines may make constipation worse.  CAUSES   Certain medicines, such as antidepressants, pain medicine, iron supplements, antacids, and water pills.   Certain diseases, such as diabetes, irritable bowel syndrome (IBS), thyroid disease, or depression.   Not drinking enough water.   Not eating enough fiber-rich foods.   Stress or travel.   Lack of physical activity or exercise.   Ignoring the urge to have a bowel movement.  Using laxatives too much.  SIGNS AND SYMPTOMS   Having fewer than three bowel movements a week.   Straining to have a bowel movement.   Having stools that are hard, dry, or larger than normal.   Feeling full or bloated.   Pain in the lower abdomen.   Not feeling relief after having a bowel movement.  DIAGNOSIS  Your health care provider will take a medical history and perform a physical exam. Further testing may be done for severe  constipation. Some tests may include:  A barium enema X-ray to examine your rectum, colon, and, sometimes, your small intestine.   A sigmoidoscopy to examine your lower colon.   A colonoscopy to examine your entire colon. TREATMENT  Treatment will depend on the severity of your constipation and what is causing it. Some dietary treatments include drinking more fluids and eating more fiber-rich foods. Lifestyle treatments may include regular exercise. If these diet and lifestyle recommendations do not help, your health care provider may recommend taking over-the-counter laxative medicines to help you have bowel movements. Prescription medicines may be prescribed if over-the-counter medicines do not work.  HOME CARE INSTRUCTIONS   Eat foods that have a lot of fiber, such as fruits, vegetables, whole grains, and beans.  Limit foods high in fat and processed sugars, such as french fries, hamburgers, cookies, candies, and soda.   A fiber supplement may be added to your diet if you cannot get enough fiber from foods.   Drink enough fluids to keep your urine clear or pale yellow.   Exercise regularly or as directed by your health care provider.   Go to the restroom when you have the urge to go. Do not hold it.   Only take over-the-counter or prescription medicines as directed by your health care provider. Do not take other medicines for constipation without talking to your health care provider first.  [place] IF:   You have bright red blood in your stool.   Your constipation lasts for more than 4 days or gets worse.   You have abdominal or rectal pain.   You have thin, pencil-like stools.   You have unexplained weight loss. MAKE SURE YOU:   Understand these instructions.  Will watch your condition.  Will get help right away if you are not doing well or get worse.   This information is not intended to replace advice given to you by your health care  provider. Make sure you discuss any questions you have with your health care provider.   Document Released: 05/09/2004 Document Revised: 09/01/2014 Document Reviewed: 05/23/2013 Elsevier Interactive Patient Education [employer].

## 2015-11-13 ENCOUNTER — Encounter: Payer: Self-pay | Admitting: Internal Medicine

## 2015-11-13 ENCOUNTER — Ambulatory Visit (INDEPENDENT_AMBULATORY_CARE_PROVIDER_SITE_OTHER): Payer: Commercial Managed Care - HMO | Admitting: Internal Medicine

## 2015-11-13 VITALS — BP 130/68 | HR 72 | Temp 98.9°F | Resp 20 | Wt 144.0 lb

## 2015-11-13 DIAGNOSIS — R05 Cough: Secondary | ICD-10-CM

## 2015-11-13 DIAGNOSIS — M545 Low back pain, unspecified: Secondary | ICD-10-CM | POA: Insufficient documentation

## 2015-11-13 DIAGNOSIS — R059 Cough, unspecified: Secondary | ICD-10-CM

## 2015-11-13 DIAGNOSIS — J449 Chronic obstructive pulmonary disease, unspecified: Secondary | ICD-10-CM | POA: Diagnosis not present

## 2015-11-13 MED ORDER — TRAMADOL HCL 50 MG PO TABS: 50.0000 mg | ORAL_TABLET | Freq: Four times a day (QID) | ORAL | Status: DC | PRN

## 2015-11-13 NOTE — Progress Notes (Signed)
Subjective:    Patient ID: Matthew Gray, male    DOB: [birth year], 82 y.o.   MRN: [id number]  HPI  Here to f/u recent ED evaluation for abd pain, with negative evaluation except for significant stool seen on CT.  Was rx miralax daily which has helped greatly and no other significant issues - Denies worsening reflux, abd pain, dysphagia, n/v, bowel change or blood.  Pt continues to have recurring LBP without change in severity, bowel or bladder change, fever, wt loss,  worsening LE pain/numbness/weakness, gait change or falls.  Also incidentally -  Here with several days cough and congestion with  acute onset ? Mild feverish, headache, general weakness and malaise, mild ST, and nonprod cough; neg cxr for infiltrate, tx for presumed CAP,  but pt denies chest pain, wheezing, increased sob or doe, orthopnea, PND, increased LE swelling, palpitations, dizziness or syncope.  Has not taken the antibiotic as prescribed, still has some left. Past Medical History  Diagnosis Date  . 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 VITAMIN D DEFICIENCY 06/04/2010  . HYPERLIPIDEMIA 04/03/2007  . DEPRESSION 11/30/2009  . HYPERTENSION 04/03/2007  . CORONARY ARTERY DISEASE 10/27/2007  . PERIPHERAL VASCULAR DISEASE 10/27/2007  . SINUSITIS- ACUTE-NOS 10/08/2009  . ALLERGIC RHINITIS 10/27/2007  . CHRONIC OBSTRUCTIVE PULMONARY DISEASE, ACUTE EXACERBATION 11/22/2007  . ASTHMA 10/27/2007  . COPD 10/27/2007  . BENIGN PROSTATIC HYPERTROPHY 10/27/2007  . SHOULDER PAIN, LEFT 06/21/2009  . ANKLE PAIN, RIGHT 11/30/2009  . SCIATICA, LEFT 11/08/2009  . BACK PAIN 10/08/2009  . MYALGIA 02/04/2008  . Pain in Soft Tissues of Limb 02/04/2008  . FATIGUE 11/30/2009  . RASH-NONVESICULAR 10/27/2007  . PERIPHERAL EDEMA 11/16/2009  . Hemoptysis 04/24/2009  . COLONIC POLYPS, HX OF 10/27/2007  . PARESTHESIA 09/02/2010  . Peripheral neuropathy ([place]) 03/08/2012  . Obstructive chronic bronchitis without exacerbation ([place])   . Dementia 01/13/2013   Past Surgical History    Procedure Laterality Date  . None      reports that he has never smoked. He has never used smokeless tobacco. He reports that he does not drink alcohol or use illicit drugs. family history includes Alcohol abuse in his other; Anuerysm in his brother; Cancer in his father, mother, and other; Hypertension in his sister; Stroke in his other. Allergies  Allergen Reactions  . Atorvastatin Other (See Comments)    REACTION: myalgias  . Simvastatin Other (See Comments)    REACTION: myalgias  . Ace Inhibitors Other (See Comments)    unknown  . Penicillins Other (See Comments)    unknown   Current Outpatient Prescriptions on File Prior to Visit  Medication Sig Dispense Refill  . acetaminophen (TYLENOL) 500 MG tablet Take 2 tablets (1,000 mg total) by mouth every 6 (six) hours as needed for moderate pain. 30 tablet 0  . benzonatate (TESSALON) 100 MG capsule Take 1 capsule (100 mg total) by mouth every 8 (eight) hours. 21 capsule 0  . diazepam (VALIUM) 5 MG tablet Take 0.5 tablets (2.5 mg total) by mouth at bedtime as needed for muscle spasms. 5 tablet 0  . donepezil (ARICEPT) 10 MG tablet Take 1 tablet (10 mg total) by mouth at bedtime. 90 tablet 1  . doxycycline (VIBRAMYCIN) 100 MG capsule Take 1 capsule (100 mg total) by mouth 2 (two) times daily. 20 capsule 0  . fexofenadine (ALLEGRA) 180 MG tablet Take 1 tablet (180 mg total) by mouth daily. For allergies 90 tablet 3  . HYDROcodone-homatropine (HYCODAN) 5-1.5 MG/5ML syrup Take 5 mLs by  mouth every 6 (six) hours as needed for cough. 180 mL 0  . lidocaine (XYLOCAINE) 4 % external solution Apply topically as needed. 50 mL 0  . lovastatin (MEVACOR) 40 MG tablet Take 1 tablet (40 mg total) by mouth daily. 90 tablet 1  . NAMENDA XR TITRATION PACK 7 & 14 & 21 &28 MG CP24 Take starter pack as directed 1 capsule 0  . omeprazole (PRILOSEC) 20 MG capsule Take 1 capsule (20 mg total) by mouth daily. 90 capsule 3  . polyethylene glycol (MIRALAX /  GLYCOLAX) packet Take 17 g by mouth daily. 14 each 0  . triamcinolone (NASACORT AQ) 55 MCG/ACT AERO nasal inhaler Place 2 sprays into the nose daily. 1 Inhaler 12  . valsartan-hydrochlorothiazide (DIOVAN HCT) 160-12.5 MG tablet Take 1 tablet by mouth daily. 90 tablet 1   No current facility-administered medications on file prior to visit.   Review of Systems  Constitutional: Negative for unusual diaphoresis or night sweats HENT: Negative for ringing in ear or discharge Eyes: Negative for double vision or worsening visual disturbance.  Respiratory: Negative for choking and stridor.   Gastrointestinal: Negative for vomiting or other signifcant bowel change Genitourinary: Negative for hematuria or change in urine volume.  Musculoskeletal: Negative for other MSK pain or swelling Skin: Negative for color change and worsening wound.  Neurological: Negative for tremors and numbness other than noted  Psychiatric/Behavioral: Negative for decreased concentration or agitation other than above       Objective:   Physical Exam BP 130/68 mmHg  Pulse 72  Temp(Src) 98.9 F (37.2 C) (Oral)  Resp 20  Wt 144 lb (65.318 kg)  SpO2 94% VS noted, non toxic but fatigued Constitutional: Pt appears in no significant distress HENT: Head: NCAT.  Right Ear: External ear normal.  Left Ear: External ear normal.  Bilat tm's with mild erythema.  Max sinus areas non tender.  Pharynx with mild erythema, no exudate Eyes: . Pupils are equal, round, and reactive to light. Conjunctivae and EOM are normal Neck: Normal range of motion. Neck supple.  Cardiovascular: Normal rate and regular rhythm.   Pulmonary/Chest: Effort normal and breath sounds without rales or wheezing.  Abd:  Soft, NT, ND, + BS Neurological: Pt is alert. Not confused , motor grossly intact Skin: Skin is warm. No rash, no LE edema Psychiatric: Pt behavior is normal. No agitation.  Spine: chronic persistent tender low lumbar midline without  swelling or rash    Assessment & Plan:

## 2015-11-13 NOTE — Patient Instructions (Addendum)
Please take all new medication as prescribed - the pain medication  Please continue all other medications as before, and refills have been done if requested.  Please have the pharmacy call with any other refills you may need.  Please keep your appointments with your specialists as you may have planned  Please return in 6 months, or sooner if needed

## 2015-11-13 NOTE — Progress Notes (Signed)
Pre visit review using our clinic review tool, if applicable. No additional management support is needed unless otherwise documented below in the visit note. 

## 2015-11-14 ENCOUNTER — Ambulatory Visit: Payer: Commercial Managed Care - HMO | Admitting: Internal Medicine

## 2015-11-14 NOTE — Assessment & Plan Note (Signed)
Chronic moderate persistent, worse to bend or move, likely related to underlying lumbar djd or ddd, for tramadol prn pain,  to f/u any worsening symptoms or concerns

## 2015-11-14 NOTE — Assessment & Plan Note (Signed)
Improved overall, ? Viral, recent cxr without infiltrate, ok to hold on taking the antibx further as doubt it will help, no need for f/u imaging at this time, ok for cough med prn he has at home and has not taken

## 2015-11-14 NOTE — Assessment & Plan Note (Signed)
stable overall by history and exam, recent data reviewed with pt, and pt to continue medical treatment as before,  to f/u any worsening symptoms or concerns SpO2 Readings from Last 3 Encounters:  11/13/15 94%  11/03/15 97%  11/02/15 96%

## 2015-11-17 ENCOUNTER — Ambulatory Visit (INDEPENDENT_AMBULATORY_CARE_PROVIDER_SITE_OTHER): Payer: Commercial Managed Care - HMO | Admitting: Family Medicine

## 2015-11-17 VITALS — BP 118/70 | HR 77 | Temp 98.1°F | Resp 18 | Ht 66.5 in | Wt 144.2 lb

## 2015-11-17 DIAGNOSIS — M5136 Other intervertebral disc degeneration, lumbar region: Secondary | ICD-10-CM

## 2015-11-17 DIAGNOSIS — M545 Low back pain, unspecified: Secondary | ICD-10-CM

## 2015-11-17 NOTE — Patient Instructions (Addendum)
IF you received an x-ray today, you will receive an invoice from [facility]. Please contact [facility] at [phone] with questions or concerns regarding your invoice.   IF you received labwork today, you will receive an invoice from [employer]. Please contact [facility] at [phone] with questions or concerns regarding your invoice.   Our billing staff will not be able to assist you with questions regarding bills from these companies.  You will be contacted with the lab results as soon as they are available. The fastest way to get your results is to activate your My Chart account. Instructions are located on the last page of this paperwork. If you have not heard from [place] regarding the results in 2 weeks, please contact this office.     Tylenol over-the-counter as we discussed initially for back pain, heat or ice to area if this feels better, and gentle range of motion. If the Tylenol is not helping, you can take the tramadol that was prescribed by Dr. Jenny Reichmann 4 days ago.  If any worsening of your symptoms, pain or weakness in your legs, or new incontinence, recommend you be seen right away.   As long as the Tylenol is helping, you can clarify with Dr. Gwynn Burly office on Monday about the tramadol prescription if you can not find it. If you are having worsening pain that the Tylenol is not helping into tomorrow, call me and I can possibly prescribe a few days worth until you can discuss the initial tramadol prescription with Dr. Jenny Reichmann.   Return to the clinic or go to the nearest emergency room if any of your symptoms worsen or new symptoms occur.  Degenerative Disk Disease Degenerative disk disease is a condition caused by the changes that occur in spinal disks as you grow older. Spinal disks are soft and compressible disks located between the bones of your spine (vertebrae). These disks act like shock absorbers. Degenerative disk disease can  affect the whole spine. However, the neck and lower back are most commonly affected. Many changes can occur in the spinal disks with aging, such as:  The spinal disks may dry and shrink.  Small tears may occur in the tough, outer covering of the disk (annulus).  The disk space may become smaller due to loss of water.  Abnormal growths in the bone (spurs) may occur. This can put pressure on the nerve roots exiting the spinal canal, causing pain.  The spinal canal may become narrowed. RISK FACTORS   Being overweight.  Having a family history of degenerative disk disease.  Smoking.  There is increased risk if you are doing heavy lifting or have a sudden injury. SIGNS AND SYMPTOMS  Symptoms vary from person to person and may include:  Pain that varies in intensity. Some people have no pain, while others have severe pain. The location of the pain depends on the part of your backbone that is affected.  You will have neck or arm pain if a disk in the neck area is affected.  You will have pain in your back, buttocks, or legs if a disk in the lower back is affected.  Pain that becomes worse while bending, reaching up, or with twisting movements.  Pain that may start gradually and then get worse as time passes. It may also start after a major or minor injury.  Numbness or tingling in the arms or legs. DIAGNOSIS  Your health care provider will ask you about your symptoms  and about activities or habits that may cause the pain. He or she may also ask about any injuries, diseases, or treatments you have had. Your health care provider will examine you to check for the range of movement that is possible in the affected area, to check for strength in your extremities, and to check for sensation in the areas of the arms and legs supplied by different nerve roots. You may also have:   An X-ray of the spine.  Other imaging tests, such as MRI. TREATMENT  Your health care provider will advise you  on the best plan for treatment. Treatment may include:  Medicines.  Rehabilitation exercises. HOME CARE INSTRUCTIONS   Follow proper lifting and walking techniques as advised by your health care provider.  Maintain good posture.  Exercise regularly as advised by your health care provider.  Perform relaxation exercises.  Change your sitting, standing, and sleeping habits as advised by your health care provider.  Change positions frequently.  Lose weight or maintain a healthy weight as advised by your health care provider.  Do not use any tobacco products, including cigarettes, chewing tobacco, or electronic cigarettes. If you need help quitting, ask your health care provider.  Wear supportive footwear.  Take medicines only as directed by your health care provider. SEEK MEDICAL CARE IF:   Your pain does not go away within 1-4 weeks.  You have significant appetite or weight loss. SEEK IMMEDIATE MEDICAL CARE IF:   Your pain is severe.  You notice weakness in your arms, hands, or legs.  You begin to lose control of your bladder or bowel movements.  You have fevers or night sweats. MAKE SURE YOU:   Understand these instructions.  Will watch your condition.  Will get help right away if you are not doing well or get worse.   This information is not intended to replace advice given to you by your health care provider. Make sure you discuss any questions you have with your health care provider.   Document Released: 06/08/2007 Document Revised: 09/01/2014 Document Reviewed: 12/13/2013 Elsevier Interactive Patient Education 2016 [employer] Back Pain, Adult Back pain is very common in adults.The cause of back pain is rarely dangerous and the pain often gets better over time.The cause of your back pain may not be known. Some common causes of back pain include:  Strain of the muscles or ligaments supporting the spine.  Wear and tear (degeneration) of the spinal  disks.  Arthritis.  Direct injury to the back. For many people, back pain may return. Since back pain is rarely dangerous, most people can learn to manage this condition on their own. HOME CARE INSTRUCTIONS Watch your back pain for any changes. The following actions may help to lessen any discomfort you are feeling:  Remain active. It is stressful on your back to sit or stand in one place for long periods of time. Do not sit, drive, or stand in one place for more than 30 minutes at a time. Take short walks on even surfaces as soon as you are able.Try to increase the length of time you walk each day.  Exercise regularly as directed by your health care provider. Exercise helps your back heal faster. It also helps avoid future injury by keeping your muscles strong and flexible.  Do not stay in bed.Resting more than 1-2 days can delay your recovery.  Pay attention to your body when you bend and lift. The most comfortable positions are those that put less  stress on your recovering back. Always use proper lifting techniques, including:  Bending your knees.  Keeping the load close to your body.  Avoiding twisting.  Find a comfortable position to sleep. Use a firm mattress and lie on your side with your knees slightly bent. If you lie on your back, put a pillow under your knees.  Avoid feeling anxious or stressed.Stress increases muscle tension and can worsen back pain.It is important to recognize when you are anxious or stressed and learn ways to manage it, such as with exercise.  Take medicines only as directed by your health care provider. Over-the-counter medicines to reduce pain and inflammation are often the most helpful.Your health care provider may prescribe muscle relaxant drugs.These medicines help dull your pain so you can more quickly return to your normal activities and healthy exercise.  Apply ice to the injured area:  Put ice in a plastic bag.  Place a towel between your  skin and the bag.  Leave the ice on for 20 minutes, 2-3 times a day for the first 2-3 days. After that, ice and heat may be alternated to reduce pain and spasms.  Maintain a healthy weight. Excess weight puts extra stress on your back and makes it difficult to maintain good posture. SEEK MEDICAL CARE IF:  You have pain that is not relieved with rest or medicine.  You have increasing pain going down into the legs or buttocks.  You have pain that does not improve in one week.  You have night pain.  You lose weight.  You have a fever or chills. SEEK IMMEDIATE MEDICAL CARE IF:   You develop new bowel or bladder control problems.  You have unusual weakness or numbness in your arms or legs.  You develop nausea or vomiting.  You develop abdominal pain.  You feel faint.   This information is not intended to replace advice given to you by your health care provider. Make sure you discuss any questions you have with your health care provider.   Document Released: 08/11/2005 Document Revised: 09/01/2014 Document Reviewed: 12/13/2013 Elsevier Interactive Patient Education [employer].

## 2015-11-17 NOTE — Progress Notes (Signed)
Subjective:    Patient ID: Matthew Gray, male    DOB: 19-Sep-1933, 82 y.o.   MRN: [id number] By signing my name below, I, Matthew Gray, attest that this documentation has been prepared under the direction and in the presence of Matthew Ray, MD. Electronically Signed: Judithe Gray, ER Scribe. 11/17/2015. 3:29 PM.  Chief Complaint  Patient presents with  . Back Pain    back spasms    HPI HPI Comments: [name] is a 82 y.o. male who presents to [facility] complaining of back spasms. He has a hx of multiple med problems. His PCP is [name], MD. He was most recently seen four days ago by his PCP for back pain, and other complaints including cough, congestion, and malaise. Per that note, he has chronic back pain with suspected DJD/DDD. 60 pills of tramadol PRN was prescribed. He has not had a recent fall or injury.   He had pneumonia two weeks ago, and reported to the ER. He was prescribed abx in the ED. He is not taking tramadol, not sure why - thinks Rx is at home. He still has a slight cough, but improved. He denies recent fever or chills.   Patient Active Problem List   Diagnosis Date Noted  . Low back pain 11/13/2015  . Cough 02/06/2015  . Weakness 07/19/2014  . Right inguinal hernia 03/04/2013  . Dementia 01/13/2013  . Obstructive chronic bronchitis without exacerbation ([place])   . Polyneuropathy in other diseases classified elsewhere ([place])   . PVD (peripheral vascular disease) ([place]) 05/04/2012  . Peripheral neuropathy ([place]) 03/08/2012  . Bilateral leg pain 03/08/2012  . Peripheral edema 09/10/2011  . Preventative health care 11/22/2010  . Weight loss 11/22/2010  . Anxiety 11/22/2010  . PARESTHESIA 09/02/2010  . VITAMIN D DEFICIENCY 06/04/2010  . DEPRESSION 11/30/2009  . ANKLE PAIN, RIGHT 11/30/2009  . FATIGUE 11/30/2009  . PERIPHERAL EDEMA 11/16/2009  . SCIATICA, LEFT 11/08/2009  . BACK PAIN 10/08/2009  . SHOULDER PAIN, LEFT 06/21/2009  . Hemoptysis 04/24/2009    . 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 MYALGIA 02/04/2008  . Pain in Soft Tissues of Limb 02/04/2008  . CORONARY ARTERY DISEASE 10/27/2007  . PERIPHERAL VASCULAR DISEASE 10/27/2007  . Allergic rhinitis 10/27/2007  . ASTHMA 10/27/2007  . COPD (chronic obstructive pulmonary disease) ([place]) 10/27/2007  . BENIGN PROSTATIC HYPERTROPHY 10/27/2007  . RASH-NONVESICULAR 10/27/2007  . COLONIC POLYPS, HX OF 10/27/2007  . HYPERLIPIDEMIA 04/03/2007  . Essential hypertension 04/03/2007   Past Medical History  Diagnosis Date  . 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 VITAMIN D DEFICIENCY 06/04/2010  . HYPERLIPIDEMIA 04/03/2007  . DEPRESSION 11/30/2009  . HYPERTENSION 04/03/2007  . CORONARY ARTERY DISEASE 10/27/2007  . PERIPHERAL VASCULAR DISEASE 10/27/2007  . SINUSITIS- ACUTE-NOS 10/08/2009  . ALLERGIC RHINITIS 10/27/2007  . CHRONIC OBSTRUCTIVE PULMONARY DISEASE, ACUTE EXACERBATION 11/22/2007  . ASTHMA 10/27/2007  . COPD 10/27/2007  . BENIGN PROSTATIC HYPERTROPHY 10/27/2007  . SHOULDER PAIN, LEFT 06/21/2009  . ANKLE PAIN, RIGHT 11/30/2009  . SCIATICA, LEFT 11/08/2009  . BACK PAIN 10/08/2009  . MYALGIA 02/04/2008  . Pain in Soft Tissues of Limb 02/04/2008  . FATIGUE 11/30/2009  . RASH-NONVESICULAR 10/27/2007  . PERIPHERAL EDEMA 11/16/2009  . Hemoptysis 04/24/2009  . COLONIC POLYPS, HX OF 10/27/2007  . PARESTHESIA 09/02/2010  . Peripheral neuropathy ([place]) 03/08/2012  . Obstructive chronic bronchitis without exacerbation ([place])   . Dementia 01/13/2013   Past Surgical History  Procedure Laterality Date  . None     Allergies  Allergen Reactions  . Atorvastatin Other (See Comments)    REACTION: myalgias  . Simvastatin Other (See Comments)    REACTION: myalgias  . Ace Inhibitors Other (See Comments)    unknown  . Penicillins Other (See Comments)    unknown   Prior to Admission medications   Medication Sig Start Date End Date Taking? Authorizing Provider  acetaminophen (TYLENOL) 500 MG tablet Take 2 tablets  (1,000 mg total) by mouth every 6 (six) hours as needed for moderate pain. 06/13/15  Yes Blanchie Dessert, MD  benzonatate (TESSALON) 100 MG capsule Take 1 capsule (100 mg total) by mouth every 8 (eight) hours. 11/03/15  Yes Domenic Moras, PA-C  diazepam (VALIUM) 5 MG tablet Take 0.5 tablets (2.5 mg total) by mouth at bedtime as needed for muscle spasms. 06/13/15  Yes Blanchie Dessert, MD  donepezil (ARICEPT) 10 MG tablet Take 1 tablet (10 mg total) by mouth at bedtime. 07/31/15  Yes Biagio Borg, MD  doxycycline (VIBRAMYCIN) 100 MG capsule Take 1 capsule (100 mg total) by mouth 2 (two) times daily. 11/03/15  Yes Domenic Moras, PA-C  fexofenadine (ALLEGRA) 180 MG tablet Take 1 tablet (180 mg total) by mouth daily. For allergies 02/06/15  Yes Biagio Borg, MD  HYDROcodone-homatropine [facility]) 5-1.5 MG/5ML syrup Take 5 mLs by mouth every 6 (six) hours as needed for cough. 10/16/15  Yes Biagio Borg, MD  lidocaine (XYLOCAINE) 4 % external solution Apply topically as needed. 06/13/15  Yes Blanchie Dessert, MD  lovastatin (MEVACOR) 40 MG tablet Take 1 tablet (40 mg total) by mouth daily. 07/31/15  Yes Biagio Borg, MD  NAMENDA XR TITRATION PACK 7 & 14 & 21 &28 MG CP24 Take starter pack as directed 03/14/14  Yes Dennie Bible, NP  omeprazole (PRILOSEC) 20 MG capsule Take 1 capsule (20 mg total) by mouth daily. 03/01/13  Yes Biagio Borg, MD  polyethylene glycol [facility] / Floria Raveling) packet Take 17 g by mouth daily. 11/03/15  Yes Domenic Moras, PA-C  traMADol (ULTRAM) 50 MG tablet Take 1 tablet (50 mg total) by mouth every 6 (six) hours as needed. 11/13/15  Yes Biagio Borg, MD  triamcinolone (NASACORT AQ) 55 MCG/ACT AERO nasal inhaler Place 2 sprays into the nose daily. 02/06/15  Yes Biagio Borg, MD  valsartan-hydrochlorothiazide (DIOVAN HCT) 160-12.5 MG tablet Take 1 tablet by mouth daily. 07/31/15  Yes Biagio Borg, MD   Social History   Social History  . Marital Status: Divorced    Spouse Name: N/A  . Number of  Children: 3  . Years of Education: 12   Occupational History  .     Social History Main Topics  . Smoking status: Never Smoker   . Smokeless tobacco: Never Used     Comment: Quit ten years ago.  . Alcohol Use: No  . Drug Use: No  . Sexual Activity: Not on file   Other Topics Concern  . Not on file   Social History Narrative   He had 12 years of education, retired from [employer] at age 35, lives alone for 31 years, has 3 children   Patient right handed.   Caffeine-  Coffee three times a week.    Review of Systems  Constitutional: Negative for fever and chills.  Respiratory: Positive for cough. Negative for shortness of breath.   Musculoskeletal: Positive for back pain. Negative for gait problem.       Objective:  BP 118/70 mmHg  Pulse 77  Temp(Src) 98.1 F (36.7 C) (Oral)  Resp 18  Ht 5' 6.5" (1.689 m)  Wt 144 lb 3.2 oz (65.409 kg)  BMI 22.93 kg/m2  SpO2 94%  Physical Exam  Constitutional: He is oriented to person, place, and time. He appears well-developed and well-nourished. No distress.  HENT:  Head: Normocephalic and atraumatic.  Eyes: Pupils are equal, round, and reactive to light.  Neck: Neck supple.  Cardiovascular: Normal rate.   Pulmonary/Chest: Effort normal. No respiratory distress.  Musculoskeletal: Normal range of motion.  Diffusely tender along the mid to lower back along the midline. No paraspinal spasm appreciated. Negative seated straight leg raise. Reflexes 2+ at patella and achilles.   Neurological: He is alert and oriented to person, place, and time. Coordination normal.  Skin: Skin is warm and dry. He is not diaphoretic.  Psychiatric: He has a normal mood and affect. His behavior is normal.  Nursing note and vitals reviewed.     Assessment & Plan:   Dempsey Mcsween is a 82 y.o. male Midline low back pain without sciatica  DDD (degenerative disc disease), lumbar  Suspected prior degenerative lumbar disease cause of back pain. Not taking  ultram or tylenol.  Advised of prior plan by PCP -can start with Tyenol otc. Heat or ice, then tramadol if needed. Contact PCP's office if Rx can not be found. rtc precautions if change in pain or worsening.   Meds ordered this encounter  Medications  . DISCONTD: doxycycline (VIBRA-TABS) 100 MG tablet    Sig:   . polyethylene glycol powder (GLYCOLAX/MIRALAX) powder    Sig:    Patient Instructions       IF you received an x-Gray today, you will receive an invoice from [facility]. Please contact [facility] at [phone] with questions or concerns regarding your invoice.   IF you received labwork today, you will receive an invoice from [employer]. Please contact [facility] at [phone] with questions or concerns regarding your invoice.   Our billing staff will not be able to assist you with questions regarding bills from these companies.  You will be contacted with the lab results as soon as they are available. The fastest way to get your results is to activate your My Chart account. Instructions are located on the last page of this paperwork. If you have not heard from [place] regarding the results in 2 weeks, please contact this office.     Tylenol over-the-counter as we discussed initially for back pain, heat or ice to area if this feels better, and gentle range of motion. If the Tylenol is not helping, you can take the tramadol that was prescribed by Dr. Jenny Reichmann 4 days ago.  If any worsening of your symptoms, pain or weakness in your legs, or new incontinence, recommend you be seen right away.   As long as the Tylenol is helping, you can clarify with Dr. Gwynn Burly office on Monday about the tramadol prescription if you can not find it. If you are having worsening pain that the Tylenol is not helping into tomorrow, call me and I can possibly prescribe a few days worth until you can discuss the initial tramadol prescription with Dr. Jenny Reichmann.   Return  to the clinic or go to the nearest emergency room if any of your symptoms worsen or new symptoms occur.  Degenerative Disk Disease Degenerative disk disease is a condition caused by the changes that occur in spinal disks as you grow older. Spinal disks are soft and  compressible disks located between the bones of your spine (vertebrae). These disks act like shock absorbers. Degenerative disk disease can affect the whole spine. However, the neck and lower back are most commonly affected. Many changes can occur in the spinal disks with aging, such as:  The spinal disks may dry and shrink.  Small tears may occur in the tough, outer covering of the disk (annulus).  The disk space may become smaller due to loss of water.  Abnormal growths in the bone (spurs) may occur. This can put pressure on the nerve roots exiting the spinal canal, causing pain.  The spinal canal may become narrowed. RISK FACTORS   Being overweight.  Having a family history of degenerative disk disease.  Smoking.  There is increased risk if you are doing heavy lifting or have a sudden injury. SIGNS AND SYMPTOMS  Symptoms vary from person to person and may include:  Pain that varies in intensity. Some people have no pain, while others have severe pain. The location of the pain depends on the part of your backbone that is affected.  You will have neck or arm pain if a disk in the neck area is affected.  You will have pain in your back, buttocks, or legs if a disk in the lower back is affected.  Pain that becomes worse while bending, reaching up, or with twisting movements.  Pain that may start gradually and then get worse as time passes. It may also start after a major or minor injury.  Numbness or tingling in the arms or legs. DIAGNOSIS  Your health care provider will ask you about your symptoms and about activities or habits that may cause the pain. He or she may also ask about any injuries, diseases, or treatments  you have had. Your health care provider will examine you to check for the range of movement that is possible in the affected area, to check for strength in your extremities, and to check for sensation in the areas of the arms and legs supplied by different nerve roots. You may also have:   An X-Gray of the spine.  Other imaging tests, such as MRI. TREATMENT  Your health care provider will advise you on the best plan for treatment. Treatment may include:  Medicines.  Rehabilitation exercises. HOME CARE INSTRUCTIONS   Follow proper lifting and walking techniques as advised by your health care provider.  Maintain good posture.  Exercise regularly as advised by your health care provider.  Perform relaxation exercises.  Change your sitting, standing, and sleeping habits as advised by your health care provider.  Change positions frequently.  Lose weight or maintain a healthy weight as advised by your health care provider.  Do not use any tobacco products, including cigarettes, chewing tobacco, or electronic cigarettes. If you need help quitting, ask your health care provider.  Wear supportive footwear.  Take medicines only as directed by your health care provider. SEEK MEDICAL CARE IF:   Your pain does not go away within 1-4 weeks.  You have significant appetite or weight loss. SEEK IMMEDIATE MEDICAL CARE IF:   Your pain is severe.  You notice weakness in your arms, hands, or legs.  You begin to lose control of your bladder or bowel movements.  You have fevers or night sweats. MAKE SURE YOU:   Understand these instructions.  Will watch your condition.  Will get help right away if you are not doing well or get worse.   This information is not  intended to replace advice given to you by your health care provider. Make sure you discuss any questions you have with your health care provider.   Document Released: 06/08/2007 Document Revised: 09/01/2014 Document Reviewed:  12/13/2013 Elsevier Interactive Patient Education 2016 Elsevier Inc. Back Pain, Adult Back pain is very common in adults.The cause of back pain is rarely dangerous and the pain often gets better over time.The cause of your back pain may not be known. Some common causes of back pain include:  Strain of the muscles or ligaments supporting the spine.  Wear and tear (degeneration) of the spinal disks.  Arthritis.  Direct injury to the back. For many people, back pain may return. Since back pain is rarely dangerous, most people can learn to manage this condition on their own. HOME CARE INSTRUCTIONS Watch your back pain for any changes. The following actions may help to lessen any discomfort you are feeling:  Remain active. It is stressful on your back to sit or stand in one place for long periods of time. Do not sit, drive, or stand in one place for more than 30 minutes at a time. Take short walks on even surfaces as soon as you are able.Try to increase the length of time you walk each day.  Exercise regularly as directed by your health care provider. Exercise helps your back heal faster. It also helps avoid future injury by keeping your muscles strong and flexible.  Do not stay in bed.Resting more than 1-2 days can delay your recovery.  Pay attention to your body when you bend and lift. The most comfortable positions are those that put less stress on your recovering back. Always use proper lifting techniques, including:  Bending your knees.  Keeping the load close to your body.  Avoiding twisting.  Find a comfortable position to sleep. Use a firm mattress and lie on your side with your knees slightly bent. If you lie on your back, put a pillow under your knees.  Avoid feeling anxious or stressed.Stress increases muscle tension and can worsen back pain.It is important to recognize when you are anxious or stressed and learn ways to manage it, such as with exercise.  Take medicines  only as directed by your health care provider. Over-the-counter medicines to reduce pain and inflammation are often the most helpful.Your health care provider may prescribe muscle relaxant drugs.These medicines help dull your pain so you can more quickly return to your normal activities and healthy exercise.  Apply ice to the injured area:  Put ice in a plastic bag.  Place a towel between your skin and the bag.  Leave the ice on for 20 minutes, 2-3 times a day for the first 2-3 days. After that, ice and heat may be alternated to reduce pain and spasms.  Maintain a healthy weight. Excess weight puts extra stress on your back and makes it difficult to maintain good posture. SEEK MEDICAL CARE IF:  You have pain that is not relieved with rest or medicine.  You have increasing pain going down into the legs or buttocks.  You have pain that does not improve in one week.  You have night pain.  You lose weight.  You have a fever or chills. SEEK IMMEDIATE MEDICAL CARE IF:   You develop new bowel or bladder control problems.  You have unusual weakness or numbness in your arms or legs.  You develop nausea or vomiting.  You develop abdominal pain.  You feel faint.   This information  is not intended to replace advice given to you by your health care provider. Make sure you discuss any questions you have with your health care provider.   Document Released: 08/11/2005 Document Revised: 09/01/2014 Document Reviewed: 12/13/2013 Elsevier Interactive Patient Education [employer].      I personally performed the services described in this documentation, which was scribed in my presence. The recorded information has been reviewed and considered, and addended by me as needed.

## 2016-02-21 ENCOUNTER — Other Ambulatory Visit: Payer: Self-pay | Admitting: Internal Medicine

## 2016-03-03 ENCOUNTER — Other Ambulatory Visit: Payer: Self-pay | Admitting: Internal Medicine

## 2016-03-12 ENCOUNTER — Telehealth: Payer: Self-pay | Admitting: Emergency Medicine

## 2016-03-12 MED ORDER — DONEPEZIL HCL 10 MG PO TABS: 10.0000 mg | ORAL_TABLET | Freq: Every day | ORAL | Status: DC

## 2016-03-12 NOTE — Telephone Encounter (Signed)
Patients sister called Pt needs a refill on prescription donepezil (ARICEPT) 10 MG tablet. Pharmacy is [facility]. The current prescription has Dr Jenny Reichmann but the wrong phone number and address. She asked for that to be updated as well. Please follow up thanks.

## 2016-03-12 NOTE — Telephone Encounter (Signed)
Please advise 

## 2016-03-12 NOTE — Telephone Encounter (Signed)
Done erx 

## 2016-03-14 ENCOUNTER — Other Ambulatory Visit: Payer: Self-pay | Admitting: Internal Medicine

## 2016-03-27 ENCOUNTER — Encounter: Payer: Self-pay | Admitting: Internal Medicine

## 2016-03-27 ENCOUNTER — Ambulatory Visit (INDEPENDENT_AMBULATORY_CARE_PROVIDER_SITE_OTHER)
Admission: RE | Admit: 2016-03-27 | Discharge: 2016-03-27 | Disposition: A | Discharge status: Home / Self Care | Payer: Commercial Managed Care - HMO | Source: Ambulatory Visit | Attending: Internal Medicine | Admitting: Internal Medicine

## 2016-03-27 ENCOUNTER — Ambulatory Visit (INDEPENDENT_AMBULATORY_CARE_PROVIDER_SITE_OTHER): Payer: Commercial Managed Care - HMO | Admitting: Internal Medicine

## 2016-03-27 ENCOUNTER — Other Ambulatory Visit (INDEPENDENT_AMBULATORY_CARE_PROVIDER_SITE_OTHER): Payer: Commercial Managed Care - HMO

## 2016-03-27 VITALS — BP 118/86 | HR 77 | Temp 97.4°F | Wt 135.0 lb

## 2016-03-27 DIAGNOSIS — M25551 Pain in right hip: Secondary | ICD-10-CM | POA: Diagnosis not present

## 2016-03-27 DIAGNOSIS — R634 Abnormal weight loss: Secondary | ICD-10-CM

## 2016-03-27 DIAGNOSIS — M79642 Pain in left hand: Secondary | ICD-10-CM

## 2016-03-27 DIAGNOSIS — M79641 Pain in right hand: Secondary | ICD-10-CM

## 2016-03-27 DIAGNOSIS — M25552 Pain in left hip: Secondary | ICD-10-CM

## 2016-03-27 DIAGNOSIS — F0390 Unspecified dementia without behavioral disturbance: Secondary | ICD-10-CM

## 2016-03-27 DIAGNOSIS — R6889 Other general symptoms and signs: Secondary | ICD-10-CM

## 2016-03-27 DIAGNOSIS — R195 Other fecal abnormalities: Secondary | ICD-10-CM | POA: Diagnosis not present

## 2016-03-27 DIAGNOSIS — Z0001 Encounter for general adult medical examination with abnormal findings: Secondary | ICD-10-CM | POA: Diagnosis not present

## 2016-03-27 DIAGNOSIS — R05 Cough: Secondary | ICD-10-CM | POA: Diagnosis not present

## 2016-03-27 LAB — VITAMIN B12: Vitamin B-12: 977 pg/mL — ABNORMAL HIGH (ref 211–911)

## 2016-03-27 LAB — CBC WITH DIFFERENTIAL/PLATELET
BASOS PCT: 0.3 % (ref 0.0–3.0)
Basophils Absolute: 0 10*3/uL (ref 0.0–0.1)
EOS PCT: 2.2 % (ref 0.0–5.0)
Eosinophils Absolute: 0.2 10*3/uL (ref 0.0–0.7)
HCT: 39.1 % (ref 39.0–52.0)
Hemoglobin: 12.8 g/dL — ABNORMAL LOW (ref 13.0–17.0)
LYMPHS ABS: 0.9 10*3/uL (ref 0.7–4.0)
Lymphocytes Relative: 12.3 % (ref 12.0–46.0)
MCHC: 32.7 g/dL (ref 30.0–36.0)
MCV: 81.2 fl (ref 78.0–100.0)
MONO ABS: 0.4 10*3/uL (ref 0.1–1.0)
Monocytes Relative: 5.8 % (ref 3.0–12.0)
NEUTROS ABS: 6.1 10*3/uL (ref 1.4–7.7)
NEUTROS PCT: 79.4 % — AB (ref 43.0–77.0)
PLATELETS: 220 10*3/uL (ref 150.0–400.0)
RBC: 4.82 Mil/uL (ref 4.22–5.81)
RDW: 15.4 % (ref 11.5–15.5)
WBC: 7.7 10*3/uL (ref 4.0–10.5)

## 2016-03-27 LAB — CK: CK TOTAL: 89 U/L (ref 7–232)

## 2016-03-27 LAB — HEPATIC FUNCTION PANEL
ALBUMIN: 3.7 g/dL (ref 3.5–5.2)
ALK PHOS: 59 U/L (ref 39–117)
ALT: 13 U/L (ref 0–53)
AST: 18 U/L (ref 0–37)
Bilirubin, Direct: 0.2 mg/dL (ref 0.0–0.3)
Total Bilirubin: 0.6 mg/dL (ref 0.2–1.2)
Total Protein: 7.4 g/dL (ref 6.0–8.3)

## 2016-03-27 LAB — URINALYSIS, ROUTINE W REFLEX MICROSCOPIC
Bilirubin Urine: NEGATIVE
Ketones, ur: NEGATIVE
Leukocytes, UA: NEGATIVE
Nitrite: NEGATIVE
SPECIFIC GRAVITY, URINE: 1.02 (ref 1.000–1.030)
Urine Glucose: NEGATIVE
Urobilinogen, UA: 1 (ref 0.0–1.0)
pH: 6 (ref 5.0–8.0)

## 2016-03-27 LAB — BASIC METABOLIC PANEL
BUN: 17 mg/dL (ref 6–23)
CO2: 32 mEq/L (ref 19–32)
Calcium: 9.4 mg/dL (ref 8.4–10.5)
Chloride: 100 mEq/L (ref 96–112)
Creatinine, Ser: 1.05 mg/dL (ref 0.40–1.50)
GFR: 87.47 mL/min (ref >60.00)
GLUCOSE: 86 mg/dL (ref 70–99)
POTASSIUM: 3.6 meq/L (ref 3.5–5.1)
SODIUM: 140 meq/L (ref 135–145)

## 2016-03-27 LAB — SEDIMENTATION RATE: SED RATE: 46 mm/h — AB (ref 0–20)

## 2016-03-27 LAB — VITAMIN D 25 HYDROXY (VIT D DEFICIENCY, FRACTURES): VITD: 71.49 ng/mL (ref 30.00–100.00)

## 2016-03-27 LAB — LIPID PANEL
CHOLESTEROL: 165 mg/dL (ref 0–200)
HDL: 67.8 mg/dL (ref >39.00)
LDL CALC: 88 mg/dL (ref 0–99)
NonHDL: 97.04
Total CHOL/HDL Ratio: 2
Triglycerides: 46 mg/dL (ref 0.0–149.0)
VLDL: 9.2 mg/dL (ref 0.0–40.0)

## 2016-03-27 LAB — TSH: TSH: 1.3 u[IU]/mL (ref 0.35–4.50)

## 2016-03-27 MED ORDER — CHOLESTYRAMINE LIGHT 4 G PO PACK: 4.0000 g | PACK | Freq: Two times a day (BID) | ORAL | 3 refills | Status: DC | PRN

## 2016-03-27 MED ORDER — DICLOFENAC SODIUM 1 % TD GEL: 4.0000 g | Freq: Four times a day (QID) | TRANSDERMAL | 11 refills | Status: DC | PRN

## 2016-03-27 MED ORDER — HYDROCODONE-HOMATROPINE 5-1.5 MG/5ML PO SYRP: 5.0000 mL | ORAL_SOLUTION | Freq: Four times a day (QID) | ORAL | 0 refills | Status: AC | PRN

## 2016-03-27 NOTE — Progress Notes (Signed)
Pre visit review using our clinic review tool, if applicable. No additional management support is needed unless otherwise documented below in the visit note. 

## 2016-03-27 NOTE — Patient Instructions (Signed)
Please take all new medication as prescribed - the topical pain medication gel for the hand, and the powder medication for the diarrhea  Please continue all other medications as before, and refills have been done if requested.  Please have the pharmacy call with any other refills you may need.  Please continue your efforts at being more active, low cholesterol diet, and weight control.  You are otherwise up to date with prevention measures today.  Please keep your appointments with your specialists as you may have planned  Please go to the XRAY Department in the Basement (go straight as you get off the elevator) for the x-ray testing  Please go to the LAB in the Basement (turn left off the elevator) for the tests to be done today  You will be contacted by phone if any changes need to be made immediately.  Otherwise, you will receive a letter about your results with an explanation, but please check with MyChart first.  Please remember to sign up for MyChart if you have not done so, as this will be important to you in the future with finding out test results, communicating by private email, and scheduling acute appointments online when needed.  Please return in 6 months, or sooner if needed

## 2016-03-27 NOTE — Progress Notes (Signed)
Subjective:    Patient ID: Matthew Gray, male    DOB: May 15, 1934, 82 y.o.   MRN: [id number]  HPI   Here for wellness and f/u;  Overall doing ok;  Pt denies Chest pain, worsening SOB, DOE, wheezing, orthopnea, PND, worsening LE edema, palpitations, dizziness or syncope.  Pt denies neurological change such as new headache, facial or extremity weakness.  Pt denies polydipsia, polyuria, or low sugar symptoms. Pt states overall good compliance with treatment and medications, good tolerability, and has been trying to follow appropriate diet.  Pt denies worsening depressive symptoms, suicidal ideation or panic. No fever, night sweats, or other constitutional symptoms.  Pt states good ability with ADL's, has low fall risk, home safety reviewed and adequate, no other significant changes in hearing or vision, and only occasionally active with exercise.  Lost about 10- lbs since mar 2017 pna.  Years ago peak wt was 175-180.  Has had some decreased appetite.   Wt Readings from Last 3 Encounters:  03/27/16 135 lb (61.2 kg)  11/17/15 144 lb 3.2 oz (65.4 kg)  11/13/15 144 lb (65.3 kg)  no hx of ETOH or tobacco.   Pt is Poor historian - but also with chronic cough, comes and goes, has hx of dust exposure working for [employer].  Dementia overall stable,, and not assoc with behavioral changes such as hallucinations, paranoia, or agitation.  Has had loose nonwatery stools about 18 mo, no pain, no n/v, fever, Denies worsening reflux, abd pain, dysphagia, n/v, other bowel change or blood.  Had episode constipation with CAP LLL in mar 2017 but transient. Wonders if dairy can make it worse.   Also with recurring "leg pain", out in yard many hours 1-2 days/wk all day, and some hours the rest of the week.Has some minor back pain and knee pain but not related. Worst pain is left> right groin areas after standing. No prior hip joint films No falls. Past Medical History:  Diagnosis Date  . ALLERGIC RHINITIS 10/27/2007  .  ANKLE PAIN, RIGHT 11/30/2009  . ASTHMA 10/27/2007  . BACK PAIN 10/08/2009  . BENIGN PROSTATIC HYPERTROPHY 10/27/2007  . CHRONIC OBSTRUCTIVE PULMONARY DISEASE, ACUTE EXACERBATION 11/22/2007  . COLONIC POLYPS, HX OF 10/27/2007  . COPD 10/27/2007  . CORONARY ARTERY DISEASE 10/27/2007  . Dementia 01/13/2013  . DEPRESSION 11/30/2009  . FATIGUE 11/30/2009  . Hemoptysis 04/24/2009  . HYPERLIPIDEMIA 04/03/2007  . HYPERTENSION 04/03/2007  . MYALGIA 02/04/2008  . Obstructive chronic bronchitis without exacerbation ([place])   . Pain in Soft Tissues of Limb 02/04/2008  . PARESTHESIA 09/02/2010  . PERIPHERAL EDEMA 11/16/2009  . Peripheral neuropathy ([place]) 03/08/2012  . PERIPHERAL VASCULAR DISEASE 10/27/2007  . 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 RASH-NONVESICULAR 10/27/2007  . SCIATICA, LEFT 11/08/2009  . SHOULDER PAIN, LEFT 06/21/2009  . SINUSITIS- ACUTE-NOS 10/08/2009  . VITAMIN D DEFICIENCY 06/04/2010   Past Surgical History:  Procedure Laterality Date  . None      reports that he has never smoked. He has never used smokeless tobacco. He reports that he does not drink alcohol or use drugs. family history includes Alcohol abuse in his other; Anuerysm in his brother; Cancer in his father, mother, and other; Hypertension in his sister; Stroke in his other. Allergies  Allergen Reactions  . Atorvastatin Other (See Comments)    REACTION: myalgias  . Simvastatin Other (See Comments)    REACTION: myalgias  . Ace Inhibitors Other (See Comments)    unknown  . Penicillins Other (See Comments)  unknown   Current Outpatient Prescriptions on File Prior to Visit  Medication Sig Dispense Refill  . acetaminophen (TYLENOL) 500 MG tablet Take 2 tablets (1,000 mg total) by mouth every 6 (six) hours as needed for moderate pain. 30 tablet 0  . donepezil (ARICEPT) 10 MG tablet Take 1 tablet (10 mg total) by mouth at bedtime. 90 tablet 2  . fexofenadine (ALLEGRA) 180 MG tablet TAKE 1 TABLET BY MOUTH ONCE DAILY FOR ALLERGIES 90  tablet 0  . lovastatin (MEVACOR) 40 MG tablet TAKE 1 TABLET BY MOUTH ONCE DAILY 90 tablet 0  . traMADol (ULTRAM) 50 MG tablet Take 1 tablet (50 mg total) by mouth every 6 (six) hours as needed. 60 tablet 1  . valsartan-hydrochlorothiazide (DIOVAN-HCT) 160-12.5 MG tablet TAKE 1 TABLET BY MOUTH DAILY 90 tablet 0  . polyethylene glycol powder (GLYCOLAX/MIRALAX) powder     . triamcinolone (NASACORT AQ) 55 MCG/ACT AERO nasal inhaler Place 2 sprays into the nose daily. (Patient not taking: Reported on 03/27/2016) 1 Inhaler 12   No current facility-administered medications on file prior to visit.    Review of Systems Constitutional: Negative for increased diaphoresis, or other activity, appetite or siginficant weight change other than noted HENT: Negative for worsening hearing loss, ear pain, facial swelling, mouth sores and neck stiffness.   Eyes: Negative for other worsening pain, redness or visual disturbance.  Respiratory: Negative for choking or stridor Cardiovascular: Negative for other chest pain and palpitations.  Gastrointestinal: Negative for worsening diarrhea, blood in stool, or abdominal distention Genitourinary: Negative for hematuria, flank pain or change in urine volume.  Musculoskeletal: Negative for myalgias or other joint complaints.  Skin: Negative for other color change and wound or drainage.  Neurological: Negative for syncope and numbness. other than noted Hematological: Negative for adenopathy. or other swelling Psychiatric/Behavioral: Negative for hallucinations, SI, self-injury, decreased concentration or other worsening agitation.      Objective:   Physical Exam BP 118/86   Pulse 77   Temp 97.4 F (36.3 C)   Wt 135 lb (61.2 kg)   SpO2 97%   BMI 21.46 kg/m  VS noted,  Constitutional: Pt is oriented to person, place, and time. Appears well-developed and well-nourished, in no significant distress Head: Normocephalic and atraumatic  Eyes: Conjunctivae and EOM are  normal. Pupils are equal, round, and reactive to light Right Ear: External ear normal.  Left Ear: External ear normal Nose: Nose normal.  Mouth/Throat: Oropharynx is clear and moist  Neck: Normal range of motion. Neck supple. No JVD present. No tracheal deviation present or significant neck LA or mass Cardiovascular: Normal rate, regular rhythm, normal heart sounds and intact distal pulses.   Pulmonary/Chest: Effort normal and breath sounds without rales or wheezing  Abdominal: Soft. Bowel sounds are normal. NT. No HSM  Musculoskeletal: Normal range of motion. Exhibits no edema Lymphadenopathy: Has no cervical adenopathy.  Neurological: Pt is alert and oriented to person, place, and time. Pt has normal reflexes. No cranial nerve deficit. Motor grossly intact Skin: Skin is warm and dry. No rash noted or new ulcers Psychiatric:  Has normal mood and affect. Behavior is normal.  Bilat hips noted to have left hip tender/pain to int rotation and reduced ROM, right hip without pain or reduced ROM Bilat hands mult finger joint OA changes, worst to 3rd MCP with effusion, tender  Most recent ct: IMPRESSION: 1. No small bowel obstruction. Colon distended with air and stool, constipation pattern. 2. Dense left lower lobe consolidation, with additional  patchy opacities in the lingula and tree in bud opacities in the right lower lobe. Findings most concerning for multifocal pneumonia/bronchiolitis. Chest radiographic follow-up is recommended to ensure clearing. 3. Mild compression deformity superior endplate of L2, age indeterminate.   Electronically Signed   By: Jeb Levering M.D.   On: 11/03/2015 00:22    Assessment & Plan:

## 2016-03-30 DIAGNOSIS — M79642 Pain in left hand: Secondary | ICD-10-CM

## 2016-03-30 DIAGNOSIS — M79641 Pain in right hand: Secondary | ICD-10-CM | POA: Insufficient documentation

## 2016-03-30 NOTE — Assessment & Plan Note (Signed)
neurovasc intact, but likely related to DJD/OA pain, for volt gel prn,  to f/u any worsening symptoms or concerns

## 2016-03-30 NOTE — Assessment & Plan Note (Signed)
stable overall by history and exam, and pt to continue medical treatment as before,  to f/u any worsening symptoms or concerns 

## 2016-03-30 NOTE — Assessment & Plan Note (Signed)
Persistent for many months, afeb and exam benign, ok for cholestryamine trial,  to f/u any worsening symptoms or concerns

## 2016-03-30 NOTE — Assessment & Plan Note (Addendum)
?   Etiology, for cxr, spep,  to f/u any worsening symptoms or concerns;   In addition to the time spent performing CPE, I spent an additional 25 minutes face to face,in which greater than 50% of this time was spent in counseling and coordination of care for patient's acute illness as documented.

## 2016-03-30 NOTE — Assessment & Plan Note (Signed)

## 2016-03-30 NOTE — Assessment & Plan Note (Signed)
Etiology unclear, ? DJD related, for hip films, avoid yard work overexertion

## 2016-05-29 ENCOUNTER — Other Ambulatory Visit: Payer: Self-pay | Admitting: Internal Medicine

## 2016-05-30 ENCOUNTER — Other Ambulatory Visit: Payer: Self-pay | Admitting: Internal Medicine

## 2016-06-21 ENCOUNTER — Ambulatory Visit (INDEPENDENT_AMBULATORY_CARE_PROVIDER_SITE_OTHER): Payer: Commercial Managed Care - HMO | Admitting: Family Medicine

## 2016-06-21 VITALS — BP 122/76 | HR 86 | Temp 97.9°F | Resp 16 | Ht 66.5 in | Wt 137.2 lb

## 2016-06-21 DIAGNOSIS — M79604 Pain in right leg: Secondary | ICD-10-CM | POA: Diagnosis not present

## 2016-06-21 MED ORDER — DICLOFENAC SODIUM 1 % TD GEL: 4.0000 g | Freq: Four times a day (QID) | TRANSDERMAL | 1 refills | Status: DC | PRN

## 2016-06-21 NOTE — Patient Instructions (Addendum)
Elevate your leg whenever you are sitting or laying.  Try to get your ankle up higher than your knee.  When you are standing your should be walking.  No standing still and no sitting with your legs hanging down. Ice or heat several times a day. Try to stay off the leg for the next 2-3 days and then hopefully you can resume normal activity. I suspect this is either a pinched nerve or a hamstring strain but both should get better with this. The diclofenac gel is at the pharmacy - if insurance doesn't cover this, you can try aspercreme.   Ok to use the tramadol prn.    IF you received an x-ray today, you will receive an invoice from [facility]. Please contact [facility] at [phone] with questions or concerns regarding your invoice.   IF you received labwork today, you will receive an invoice from [employer]. Please contact [facility] at [phone] with questions or concerns regarding your invoice.   Our billing staff will not be able to assist you with questions regarding bills from these companies.  You will be contacted with the lab results as soon as they are available. The fastest way to get your results is to activate your My Chart account. Instructions are located on the last page of this paperwork. If you have not heard from [place] regarding the results in 2 weeks, please contact this office.      RICE for Routine Care of Injuries Theroutine careofmanyinjuriesincludes rest, ice, compression, and elevation (RICE therapy). RICE therapy is often recommended for injuries to soft tissues, such as a muscle strain, ligament injuries, bruises, and overuse injuries. It can also be used for some bony injuries. Using RICE therapy can help to relieve pain, lessen swelling, and enable your body to heal. Rest Rest is required to allow your body to heal. This usually involves reducing your normal activities and avoiding use of the injured part of your  body. Generally, you can return to your normal activities when you are comfortable and have been given permission by your health care provider. Ice Icing your injury helps to keep the swelling down, and it lessens pain. Do not apply ice directly to your skin.  Put ice in a plastic bag.  Place a towel between your skin and the bag.  Leave the ice on for 20 minutes, 2-3 times a day. Do this for as long as you are directed by your health care provider. Compression Compression means putting pressure on the injured area. Compression helps to keep swelling down, gives support, and helps with discomfort. Compression may be done with an elastic bandage. If an elastic bandage has been applied, follow these general tips:  Remove and reapply the bandage every 3-4 hours or as directed by your health care provider.  Make sure the bandage is not wrapped too tightly, because this can cut off circulation. If part of your body beyond the bandage becomes blue, numb, cold, swollen, or more painful, your bandage is most likely too tight. If this occurs, remove your bandage and reapply it more loosely.  See your health care provider if the bandage seems to be making your problems worse rather than better. Elevation Elevation means keeping the injured area raised. This helps to lessen swelling and decrease pain. If possible, your injured area should be elevated at or above the level of your heart or the center of your chest. [place]? You should seek medical  care if:  Your pain and swelling continue.  Your symptoms are getting worse rather than improving. These symptoms may indicate that further evaluation or further X-rays are needed. Sometimes, X-rays may not show a small broken bone (fracture) until a number of days later. Make a follow-up appointment with your health care provider. WHEN SHOULD I SEEK IMMEDIATE MEDICAL CARE? You should seek immediate medical care if:  You have  sudden severe pain at or below the area of your injury.  You have redness or increased swelling around your injury.  You have tingling or numbness at or below the area of your injury that does not improve after you remove the elastic bandage.   This information is not intended to replace advice given to you by your health care provider. Make sure you discuss any questions you have with your health care provider.   Document Released: 11/23/2000 Document Revised: 05/02/2015 Document Reviewed: 07/19/2014 Elsevier Interactive Patient Education [employer].

## 2016-06-22 NOTE — Progress Notes (Signed)
Subjective:    Patient ID: Matthew Gray, male    DOB: February 24, 1934, 82 y.o.   MRN: [id number] Chief Complaint  Patient presents with  . Leg Pain    rt leg/ x 1 day    HPI  Matthew Gray is a pleasant 82 yo male c/o right leg pain that began last night after he had spent all day raking leaves.  This happens to him every year.  He took a tramadol prior to visit today which has helped some.  The pain is in the back of thigh, just prox to the knee and extends all the way to his foot.  No back pain or hip pain.  No knee pain other than in popliteal fossa.  No f/c, no bowel/bladder changes. No weakness or numbness, no gait prob. No edema, no CP/SHoB/DOE/palp. No rash.  Matthew Gray is accompanied by his daughter.  He does seem to have some mild dementia.  Past Medical History:  Diagnosis Date  . ALLERGIC RHINITIS 10/27/2007  . ANKLE PAIN, RIGHT 11/30/2009  . ASTHMA 10/27/2007  . BACK PAIN 10/08/2009  . BENIGN PROSTATIC HYPERTROPHY 10/27/2007  . CHRONIC OBSTRUCTIVE PULMONARY DISEASE, ACUTE EXACERBATION 11/22/2007  . COLONIC POLYPS, HX OF 10/27/2007  . COPD 10/27/2007  . CORONARY ARTERY DISEASE 10/27/2007  . Dementia 01/13/2013  . DEPRESSION 11/30/2009  . FATIGUE 11/30/2009  . Hemoptysis 04/24/2009  . HYPERLIPIDEMIA 04/03/2007  . HYPERTENSION 04/03/2007  . MYALGIA 02/04/2008  . Obstructive chronic bronchitis without exacerbation ([place])   . Pain in Soft Tissues of Limb 02/04/2008  . PARESTHESIA 09/02/2010  . PERIPHERAL EDEMA 11/16/2009  . Peripheral neuropathy ([place]) 03/08/2012  . PERIPHERAL VASCULAR DISEASE 10/27/2007  . 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 RASH-NONVESICULAR 10/27/2007  . SCIATICA, LEFT 11/08/2009  . SHOULDER PAIN, LEFT 06/21/2009  . SINUSITIS- ACUTE-NOS 10/08/2009  . VITAMIN D DEFICIENCY 06/04/2010   Current Outpatient Prescriptions on File Prior to Visit  Medication Sig Dispense Refill  . acetaminophen (TYLENOL) 500 MG tablet Take 2 tablets (1,000 mg total) by mouth every 6 (six) hours as needed for  moderate pain. 30 tablet 0  . cholestyramine light (PREVALITE) 4 g packet Take 1 packet (4 g total) by mouth 2 (two) times daily as needed. 180 packet 3  . donepezil (ARICEPT) 10 MG tablet Take 1 tablet (10 mg total) by mouth at bedtime. 90 tablet 2  . fexofenadine (ALLEGRA) 180 MG tablet TAKE 1 TABLET BY MOUTH ONCE DAILY FOR ALLERGIES 90 tablet 0  . lovastatin (MEVACOR) 40 MG tablet TAKE 1 TABLET BY MOUTH DAILY 90 tablet 0  . polyethylene glycol powder (GLYCOLAX/MIRALAX) powder     . traMADol (ULTRAM) 50 MG tablet Take 1 tablet (50 mg total) by mouth every 6 (six) hours as needed. 60 tablet 1  . valsartan-hydrochlorothiazide (DIOVAN-HCT) 160-12.5 MG tablet TAKE 1 TABLET BY MOUTH DAILY 90 tablet 0  . triamcinolone (NASACORT AQ) 55 MCG/ACT AERO nasal inhaler Place 2 sprays into the nose daily. (Patient not taking: Reported on 06/21/2016) 1 Inhaler 12   No current facility-administered medications on file prior to visit.    Allergies  Allergen Reactions  . Atorvastatin Other (See Comments)    REACTION: myalgias  . Simvastatin Other (See Comments)    REACTION: myalgias  . Ace Inhibitors Other (See Comments)    unknown  . Penicillins Other (See Comments)    unknown       Review of Systems  Constitutional: Positive for activity change (increased). Negative for appetite change, chills,  diaphoresis and fever.  Respiratory: Negative for cough, chest tightness and shortness of breath.   Cardiovascular: Negative for chest pain, palpitations and leg swelling.  Musculoskeletal: Positive for arthralgias and myalgias. Negative for back pain, gait problem and joint swelling.  Skin: Negative for color change, rash and wound.  Neurological: Negative for weakness and numbness.  Hematological: Negative for adenopathy. Does not bruise/bleed easily.       Objective:   Physical Exam  Constitutional: He is oriented to person, place, and time. He appears well-developed and well-nourished. No  distress.  HENT:  Head: Normocephalic and atraumatic.  Cardiovascular: Intact distal pulses.   Pulmonary/Chest: Effort normal.  Musculoskeletal: Normal range of motion. He exhibits no edema or tenderness.       Right knee: Normal. He exhibits normal range of motion, no swelling, no effusion, no erythema and no bony tenderness. No tenderness found.       Thoracic back: He exhibits no tenderness, no bony tenderness, no swelling, no deformity and no pain.       Lumbar back: He exhibits no tenderness, no bony tenderness, no edema, no deformity and no pain.       Right upper leg: Normal. He exhibits no tenderness, no bony tenderness, no swelling and no edema.       Right lower leg: Normal. He exhibits no tenderness, no bony tenderness, no swelling and no edema.  Negative straight leg raise bilaterally  Neurological: He is alert and oriented to person, place, and time. He has normal strength and normal reflexes. He displays no atrophy. No sensory deficit. He exhibits normal muscle tone. Coordination and gait normal.  Reflex Scores:      Patellar reflexes are 2+ on the right side and 2+ on the left side.      Achilles reflexes are 2+ on the right side and 2+ on the left side. Skin: Skin is warm and dry. No rash noted. He is not diaphoretic. No erythema.  Psychiatric: He has a normal mood and affect. His behavior is normal.    BP 122/76   Pulse 86   Temp 97.9 F (36.6 C) (Oral)   Resp 16   Ht 5' 6.5" (1.689 m)   Wt 137 lb 3.2 oz (62.2 kg)   SpO2 97%   BMI 21.81 kg/m      Assessment & Plan:   1. Right leg pain   Has responded well to voltaren gel prior.  Unknown etiology of leg pain - ddx includes hamstring strain, knee OA, or sciatica.  Fortunately, pt reports this happens annually and usu resolves with rest. RTC if not resolving.  Meds ordered this encounter  Medications  . diclofenac sodium (VOLTAREN) 1 % GEL    Sig: Apply 4 g topically 4 (four) times daily as needed.    Dispense:   400 g    Refill:  1     Delman Cheadle, M.D.  [facility] [address] [place], [place] [address] [phone] phone [phone] fax  06/22/16 12:06 AM

## 2016-07-01 ENCOUNTER — Other Ambulatory Visit: Payer: Self-pay | Admitting: Internal Medicine

## 2016-07-15 ENCOUNTER — Ambulatory Visit: Payer: Commercial Managed Care - HMO | Admitting: Nurse Practitioner

## 2016-07-16 ENCOUNTER — Ambulatory Visit (INDEPENDENT_AMBULATORY_CARE_PROVIDER_SITE_OTHER): Payer: Commercial Managed Care - HMO | Admitting: Nurse Practitioner

## 2016-07-16 ENCOUNTER — Encounter: Payer: Self-pay | Admitting: Nurse Practitioner

## 2016-07-16 VITALS — BP 118/84 | HR 80 | Temp 97.7°F | Ht 66.5 in | Wt 140.0 lb

## 2016-07-16 DIAGNOSIS — M25561 Pain in right knee: Secondary | ICD-10-CM | POA: Diagnosis not present

## 2016-07-16 DIAGNOSIS — M25562 Pain in left knee: Secondary | ICD-10-CM | POA: Diagnosis not present

## 2016-07-16 DIAGNOSIS — M545 Low back pain: Secondary | ICD-10-CM

## 2016-07-16 DIAGNOSIS — G8929 Other chronic pain: Secondary | ICD-10-CM | POA: Diagnosis not present

## 2016-07-16 MED ORDER — TRAMADOL HCL 50 MG PO TABS: 50.0000 mg | ORAL_TABLET | Freq: Four times a day (QID) | ORAL | 0 refills | Status: DC | PRN

## 2016-07-16 MED ORDER — DICLOFENAC SODIUM 2 % TD SOLN: 1.0000 "application " | Freq: Two times a day (BID) | TRANSDERMAL | 2 refills | Status: DC | PRN

## 2016-07-16 NOTE — Progress Notes (Signed)
Pre visit review using our clinic review tool, if applicable. No additional management support is needed unless otherwise documented below in the visit note. 

## 2016-07-16 NOTE — Patient Instructions (Addendum)
Patient declined use of walker or cane.  Tramadol prescription was given to patient brother in office.

## 2016-07-16 NOTE — Progress Notes (Signed)
Subjective:  Patient ID: Matthew Gray, male    DOB: 1934-08-20  Age: 82 y.o. MRN: [id number]  CC: Leg Pain (pt stated right leg pain for 3 wks. went to urgent care for this problem -med given. )  HPI Patient in office with brother. His medications are managed by his sister (debra). He is not taking tramadol or diclofenac per sister because she was not aware of both prescriptions. He complains of persistent bilateral LE pain. denies any injury. Pain is worse with prolonged standing or sitting.  [place] controlled substance Database checked 07/16/16: Tramadol prescription 11/13/15 was never filled..  Outpatient Medications Prior to Visit  Medication Sig Dispense Refill  . acetaminophen (TYLENOL) 500 MG tablet Take 2 tablets (1,000 mg total) by mouth every 6 (six) hours as needed for moderate pain. 30 tablet 0  . cholestyramine light (PREVALITE) 4 g packet Take 1 packet (4 g total) by mouth 2 (two) times daily as needed. 180 packet 3  . donepezil (ARICEPT) 10 MG tablet Take 1 tablet (10 mg total) by mouth at bedtime. 90 tablet 2  . fexofenadine (ALLEGRA) 180 MG tablet TAKE 1 TABLET BY MOUTH DAILY FOR ALLERGIES 90 tablet 0  . lovastatin (MEVACOR) 40 MG tablet TAKE 1 TABLET BY MOUTH DAILY 90 tablet 0  . polyethylene glycol powder (GLYCOLAX/MIRALAX) powder     . triamcinolone (NASACORT AQ) 55 MCG/ACT AERO nasal inhaler Place 2 sprays into the nose daily. 1 Inhaler 12  . valsartan-hydrochlorothiazide (DIOVAN-HCT) 160-12.5 MG tablet TAKE 1 TABLET BY MOUTH DAILY 90 tablet 0  . diclofenac sodium (VOLTAREN) 1 % GEL Apply 4 g topically 4 (four) times daily as needed. 400 g 1  . traMADol (ULTRAM) 50 MG tablet Take 1 tablet (50 mg total) by mouth every 6 (six) hours as needed. 60 tablet 1   No facility-administered medications prior to visit.     ROS See HPI  Objective:  BP 118/84 (BP Location: Right Arm, Patient Position: Sitting, Cuff Size: Normal)   Pulse 80   Temp 97.7 F (36.5 C) (Oral)   Ht  5' 6.5" (1.689 m)   Wt 140 lb (63.5 kg)   SpO2 94%   BMI 22.26 kg/m   BP Readings from Last 3 Encounters:  07/16/16 118/84  06/21/16 122/76  03/27/16 118/86    Wt Readings from Last 3 Encounters:  07/16/16 140 lb (63.5 kg)  06/21/16 137 lb 3.2 oz (62.2 kg)  03/27/16 135 lb (61.2 kg)    Physical Exam  Constitutional: He is oriented to person, place, and time.  Neck: Normal range of motion. Neck supple.  Cardiovascular: Normal rate and normal heart sounds.   Pulmonary/Chest: Effort normal.  Musculoskeletal: He exhibits edema.  Chronic bilateral LE edema (1+pitting) Edema has not change per patient.  Neurological: He is alert and oriented to person, place, and time.  Unsteady gait.  Skin: Skin is warm and dry.    Lab Results  Component Value Date   WBC 7.7 03/27/2016   HGB 12.8 (L) 03/27/2016   HCT 39.1 03/27/2016   PLT 220.0 03/27/2016   GLUCOSE 86 03/27/2016   CHOL 165 03/27/2016   TRIG 46.0 03/27/2016   HDL 67.80 03/27/2016   LDLDIRECT 131.5 06/24/2006   LDLCALC 88 03/27/2016   ALT 13 03/27/2016   AST 18 03/27/2016   NA 140 03/27/2016   K 3.6 03/27/2016   CL 100 03/27/2016   CREATININE 1.05 03/27/2016   BUN 17 03/27/2016   CO2 32 03/27/2016  TSH 1.30 03/27/2016   PSA 1.07 03/08/2012    Dg Chest 2 View  Result Date: 03/27/2016 CLINICAL DATA:  Chronic cough. EXAM: CHEST  2 VIEW COMPARISON:  Radiographs of November 18, 2010. FINDINGS: The heart size and mediastinal contours are within normal limits. No pneumothorax or pleural effusion is noted. Mild bibasilar subsegmental atelectasis or scarring is noted. The visualized skeletal structures are unremarkable. IMPRESSION: Stable mild bibasilar scarring or subsegmental atelectasis. Electronically Signed   By: Marijo Conception, M.D.   On: 03/27/2016 11:35   Dg Hips Bilat With Pelvis 3-4 Views  Result Date: 03/27/2016 CLINICAL DATA:  Bilateral hip pain for 6 months without known injury. EXAM: DG HIP (WITH OR WITHOUT  PELVIS) 3-4V BILAT COMPARISON:  None. FINDINGS: There is no evidence of hip fracture or dislocation. There is no evidence of arthropathy or other focal bone abnormality. IMPRESSION: Normal bilateral hips. Electronically Signed   By: Marijo Conception, M.D.   On: 03/27/2016 11:37    Assessment & Plan:   Casmir was seen today for leg pain.  Diagnoses and all orders for this visit:  Chronic bilateral low back pain without sciatica -     traMADol (ULTRAM) 50 MG tablet; Take 1 tablet (50 mg total) by mouth every 6 (six) hours as needed (back and knee pain). -     Diclofenac Sodium (PENNSAID) 2 % SOLN; Place 1 application onto the skin 2 (two) times daily as needed.  Arthralgia of both lower legs -     traMADol (ULTRAM) 50 MG tablet; Take 1 tablet (50 mg total) by mouth every 6 (six) hours as needed (back and knee pain). -     Diclofenac Sodium (PENNSAID) 2 % SOLN; Place 1 application onto the skin 2 (two) times daily as needed.   I have discontinued Mr. Avilla's diclofenac sodium. I have also changed his traMADol. Additionally, I am having him start on Diclofenac Sodium. Lastly, I am having him maintain his triamcinolone, acetaminophen, polyethylene glycol powder, donepezil, cholestyramine light, valsartan-hydrochlorothiazide, lovastatin, and fexofenadine.  Meds ordered this encounter  Medications  . traMADol (ULTRAM) 50 MG tablet    Sig: Take 1 tablet (50 mg total) by mouth every 6 (six) hours as needed (back and knee pain).    Dispense:  30 tablet    Refill:  0    Order Specific Question:   Supervising Provider    Answer:   Cassandria Anger [[id number]]  . Diclofenac Sodium (PENNSAID) 2 % SOLN    Sig: Place 1 application onto the skin 2 (two) times daily as needed.    Dispense:  2 g    Refill:  2    Order Specific Question:   Supervising Provider    Answer:   Cassandria Anger [[id number]]    Reviewed labs drawn 03/2016: cbc, cmp, sed rate, CK total, vit D and Vit B12.  Patient declined use  of cane or walker at this time.  Follow-up: No Follow-up on file.  Wilfred Lacy, NP

## 2016-08-13 ENCOUNTER — Encounter: Payer: Self-pay | Admitting: Internal Medicine

## 2016-08-13 ENCOUNTER — Ambulatory Visit (INDEPENDENT_AMBULATORY_CARE_PROVIDER_SITE_OTHER): Payer: Commercial Managed Care - HMO | Admitting: Internal Medicine

## 2016-08-13 VITALS — BP 128/76 | HR 72 | Temp 98.2°F | Resp 20 | Wt 131.0 lb

## 2016-08-13 DIAGNOSIS — M25562 Pain in left knee: Secondary | ICD-10-CM

## 2016-08-13 DIAGNOSIS — M545 Low back pain, unspecified: Secondary | ICD-10-CM

## 2016-08-13 DIAGNOSIS — G6289 Other specified polyneuropathies: Secondary | ICD-10-CM

## 2016-08-13 DIAGNOSIS — M25561 Pain in right knee: Secondary | ICD-10-CM

## 2016-08-13 DIAGNOSIS — I739 Peripheral vascular disease, unspecified: Secondary | ICD-10-CM | POA: Diagnosis not present

## 2016-08-13 DIAGNOSIS — M25571 Pain in right ankle and joints of right foot: Secondary | ICD-10-CM | POA: Diagnosis not present

## 2016-08-13 DIAGNOSIS — G8929 Other chronic pain: Secondary | ICD-10-CM

## 2016-08-13 MED ORDER — TRAMADOL HCL 50 MG PO TABS: 50.0000 mg | ORAL_TABLET | Freq: Three times a day (TID) | ORAL | 3 refills | Status: DC | PRN

## 2016-08-13 MED ORDER — PREDNISONE 10 MG PO TABS: ORAL_TABLET | ORAL | 0 refills | Status: DC

## 2016-08-13 NOTE — Progress Notes (Signed)
Pre visit review using our clinic review tool, if applicable. No additional management support is needed unless otherwise documented below in the visit note. 

## 2016-08-13 NOTE — Patient Instructions (Signed)
Please take all new medication as prescribed - the prednisone  Please continue all other medications as before, and refills have been done if requested - the tramadol  You will be contacted regarding the referral for: Leg circulation test, and Dr Smith/sports medicine (or you can make an appt as you leave today)  Please have the pharmacy call with any other refills you may need.  Please keep your appointments with your specialists as you may have planned

## 2016-08-14 ENCOUNTER — Telehealth: Payer: Self-pay | Admitting: *Deleted

## 2016-08-14 MED ORDER — PREDNISONE 10 MG PO TABS: ORAL_TABLET | ORAL | 0 refills | Status: DC

## 2016-08-14 NOTE — Telephone Encounter (Signed)
Rec'd call from [facility] stating they received Prednisone script from Dr. Jenny Reichmann. This is a Radiographer, therapeutic and think MD sent to wrong pharmacy. Rx need to be sent to pt local pharmacy. Inform will resend to [facility]...Johny Chess

## 2016-08-16 DIAGNOSIS — M25571 Pain in right ankle and joints of right foot: Secondary | ICD-10-CM | POA: Insufficient documentation

## 2016-08-16 NOTE — Assessment & Plan Note (Signed)
Also with neuropathic pain, for tramadol prn,  to f/u any worsening symptoms or concerns

## 2016-08-16 NOTE — Assessment & Plan Note (Signed)
?   Symptomatic, for LE arterial dopplers,  to f/u any worsening symptoms or concerns

## 2016-08-16 NOTE — Progress Notes (Signed)
Subjective:    Patient ID: Matthew Gray, male    DOB: September 29, 1933, 82 y.o.   MRN: [id number]  HPI  Here with family, difficult historian, c/o pain and swelling to right medial ankle x > 4 wks, worsening with more walking, better to sit and rest, sharp, intermittent, mild to mod.  Also with bilat right > left calf pain to ambulate, better to rest, mild, intermittent, worse to walk, better to rest. Pt denies chest pain, increased sob or doe, wheezing, orthopnea, PND, increased LE swelling, palpitations, dizziness or syncope.  Pt denies new neurological symptoms such as new headache, or facial or extremity weakness or numbness   Pt denies polydipsia, polyuria. Pt continues to have recurring LBP without change in severity, bowel or bladder change, fever, wt loss, or falls. Past Medical History:  Diagnosis Date  . ALLERGIC RHINITIS 10/27/2007  . ANKLE PAIN, RIGHT 11/30/2009  . ASTHMA 10/27/2007  . BACK PAIN 10/08/2009  . BENIGN PROSTATIC HYPERTROPHY 10/27/2007  . CHRONIC OBSTRUCTIVE PULMONARY DISEASE, ACUTE EXACERBATION 11/22/2007  . COLONIC POLYPS, HX OF 10/27/2007  . COPD 10/27/2007  . CORONARY ARTERY DISEASE 10/27/2007  . Dementia 01/13/2013  . DEPRESSION 11/30/2009  . FATIGUE 11/30/2009  . Hemoptysis 04/24/2009  . HYPERLIPIDEMIA 04/03/2007  . HYPERTENSION 04/03/2007  . MYALGIA 02/04/2008  . Obstructive chronic bronchitis without exacerbation ([place])   . Pain in Soft Tissues of Limb 02/04/2008  . PARESTHESIA 09/02/2010  . PERIPHERAL EDEMA 11/16/2009  . Peripheral neuropathy ([place]) 03/08/2012  . PERIPHERAL VASCULAR DISEASE 10/27/2007  . 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 RASH-NONVESICULAR 10/27/2007  . SCIATICA, LEFT 11/08/2009  . SHOULDER PAIN, LEFT 06/21/2009  . SINUSITIS- ACUTE-NOS 10/08/2009  . VITAMIN D DEFICIENCY 06/04/2010   Past Surgical History:  Procedure Laterality Date  . None      reports that he has never smoked. He has never used smokeless tobacco. He reports that he does not drink alcohol or use  drugs. family history includes Alcohol abuse in his other; Anuerysm in his brother; Cancer in his father, mother, and other; Hypertension in his sister; Stroke in his other. Allergies  Allergen Reactions  . Atorvastatin Other (See Comments)    REACTION: myalgias  . Simvastatin Other (See Comments)    REACTION: myalgias  . Ace Inhibitors Other (See Comments)    unknown  . Penicillins Other (See Comments)    unknown   Current Outpatient Prescriptions on File Prior to Visit  Medication Sig Dispense Refill  . acetaminophen (TYLENOL) 500 MG tablet Take 2 tablets (1,000 mg total) by mouth every 6 (six) hours as needed for moderate pain. 30 tablet 0  . cholestyramine light (PREVALITE) 4 g packet Take 1 packet (4 g total) by mouth 2 (two) times daily as needed. 180 packet 3  . Diclofenac Sodium (PENNSAID) 2 % SOLN Place 1 application onto the skin 2 (two) times daily as needed. 2 g 2  . donepezil (ARICEPT) 10 MG tablet Take 1 tablet (10 mg total) by mouth at bedtime. 90 tablet 2  . fexofenadine (ALLEGRA) 180 MG tablet TAKE 1 TABLET BY MOUTH DAILY FOR ALLERGIES 90 tablet 0  . lovastatin (MEVACOR) 40 MG tablet TAKE 1 TABLET BY MOUTH DAILY 90 tablet 0  . polyethylene glycol powder (GLYCOLAX/MIRALAX) powder     . triamcinolone (NASACORT AQ) 55 MCG/ACT AERO nasal inhaler Place 2 sprays into the nose daily. 1 Inhaler 12  . valsartan-hydrochlorothiazide (DIOVAN-HCT) 160-12.5 MG tablet TAKE 1 TABLET BY MOUTH DAILY 90 tablet 0  No current facility-administered medications on file prior to visit.    Review of Systems  Constitutional: Negative for unusual diaphoresis or night sweats HENT: Negative for ear swelling or discharge Eyes: Negative for worsening visual haziness  Respiratory: Negative for choking and stridor.   Gastrointestinal: Negative for distension or worsening eructation Genitourinary: Negative for retention or change in urine volume.  Musculoskeletal: Negative for other MSK pain or  swelling Skin: Negative for color change and worsening wound Neurological: Negative for tremors and numbness other than noted  Psychiatric/Behavioral: Negative for decreased concentration or agitation other than above   All other system neg per pt    Objective:   Physical Exam BP 128/76   Pulse 72   Temp 98.2 F (36.8 C) (Oral)   Resp 20   Wt 131 lb (59.4 kg)   SpO2 98%   BMI 20.83 kg/m  VS noted,  Constitutional: Pt appears in no apparent distress HENT: Head: NCAT.  Right Ear: External ear normal.  Left Ear: External ear normal.  Eyes: . Pupils are equal, round, and reactive to light. Conjunctivae and EOM are normal Neck: Normal range of motion. Neck supple.  Cardiovascular: Normal rate and regular rhythm.   Pulmonary/Chest: Effort normal and breath sounds without rales or wheezing.  Abd:  Soft, NT, ND, + BS Neurological: Pt is alert. Not confused , motor grossly intact Skin: Skin is warm. No rash, no LE edema Psychiatric: Pt behavior is normal. No agitation. Right ankle with 1-2+ tender/swelling area just post to the lateral malleolus, without overlying skin change, no ulcer; bilat dorsalis pedis trace  No other new exam findings    Assessment & Plan:

## 2016-08-16 NOTE — Assessment & Plan Note (Signed)
Stable, for tramadol prn,  to f/u any worsening symptoms or concerns

## 2016-08-16 NOTE — Assessment & Plan Note (Addendum)
With medial pain//tender/sweling x few weeks getting worse with ambulation, suspect tarsal tunnell and/or ganglion cyst like abnormality, for pain control, trial predpac asd, refer sport medicine

## 2016-09-02 NOTE — Progress Notes (Signed)
[facility] [address] [place], [place] [address] Phone: [phone] Subjective:    I'm seeing this patient by the request  of:  [name], MD   CC: Bilateral ankle and knee pain  [id number]  Shivank Bence is a 83 y.o. male coming in with complaint of right medial ankle pain for going on 6 weeks. Seems worse with activity such as walking better when he sits arrest. Describes the pain is sharp and intermittent. Moderate severity. Notices anything seems to radiate of both calves right greater than left. Also better with rest. Past medical history peripheral vascular disease. Patient was also sent for largely Dopplers. Dopplers do show the patient did have moderate decrease in blood flow.   of the previous imaging shows the patient's symptoms and no significant arthritic changes Of the hips bilaterally.  Past Medical History:  Diagnosis Date  . ALLERGIC RHINITIS 10/27/2007  . ANKLE PAIN, RIGHT 11/30/2009  . ASTHMA 10/27/2007  . BACK PAIN 10/08/2009  . BENIGN PROSTATIC HYPERTROPHY 10/27/2007  . CHRONIC OBSTRUCTIVE PULMONARY DISEASE, ACUTE EXACERBATION 11/22/2007  . COLONIC POLYPS, HX OF 10/27/2007  . COPD 10/27/2007  . CORONARY ARTERY DISEASE 10/27/2007  . Dementia 01/13/2013  . DEPRESSION 11/30/2009  . FATIGUE 11/30/2009  . Hemoptysis 04/24/2009  . HYPERLIPIDEMIA 04/03/2007  . HYPERTENSION 04/03/2007  . MYALGIA 02/04/2008  . Obstructive chronic bronchitis without exacerbation ([place])   . Pain in Soft Tissues of Limb 02/04/2008  . PARESTHESIA 09/02/2010  . PERIPHERAL EDEMA 11/16/2009  . Peripheral neuropathy ([place]) 03/08/2012  . PERIPHERAL VASCULAR DISEASE 10/27/2007  . 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 RASH-NONVESICULAR 10/27/2007  . SCIATICA, LEFT 11/08/2009  . SHOULDER PAIN, LEFT 06/21/2009  . SINUSITIS- ACUTE-NOS 10/08/2009  . VITAMIN D DEFICIENCY 06/04/2010   Past Surgical History:  Procedure Laterality Date  . None     Social History   Social History  .  Marital status: Divorced    Spouse name: N/A  . Number of children: 3  . Years of education: 12   Occupational History  .  Retired   Social History Main Topics  . Smoking status: Never Smoker  . Smokeless tobacco: Never Used     Comment: Quit ten years ago.  . Alcohol use No  . Drug use: No  . Sexual activity: No   Other Topics Concern  . None   Social History Narrative   He had 12 years of education, retired from [employer] at age 5, lives alone for 31 years, has 3 children   Patient right handed.   Caffeine-  Coffee three times a week.   Allergies  Allergen Reactions  . Atorvastatin Other (See Comments)    REACTION: myalgias  . Simvastatin Other (See Comments)    REACTION: myalgias  . Ace Inhibitors Other (See Comments)    unknown  . Penicillins Other (See Comments)    unknown   Family History  Problem Relation Age of Onset  . Cancer Mother     BREAST  . Cancer Father     LUNG  . Hypertension Sister     3 sisters  . Anuerysm Brother     died with  . Stroke Other   . Cancer Other     lung and breast  . Alcohol abuse Other     Past medical history, social, surgical and family history all reviewed in electronic medical record.  No pertanent information unless stated regarding to the chief complaint.   Review of  Systems:Review of systems updated and as accurate as of 09/03/16  Positive for recent cough, mild shortness of breath, discomfort of muscles and joint pain spun mostly of the lower extremity. Objective  Blood pressure 104/74, pulse 81, height 5' 6.5" (1.689 m), weight 134 lb (60.8 kg), SpO2 92 %. Systems examined below as of 09/03/16   General: No apparent distress alert and oriented x3 mood and affect normal, dressed appropriately. Mild cachectic HEENT: Pupils equal, extraocular movements intact  Respiratory: Patient's speak in full sentences and does not appear short of breath  Cardiovascular: No lower extremity edema, non tender, no erythema    Skin: Warm dry intact with no signs of infection or rash on extremities or on axial skeleton.  Abdomen: Soft nontender  Neuro: Cranial nerves II through XII are intact, neurovascularly intact in all extremities with 2+ DTRs and 1+ pulses extremities.  Lymph: No lymphadenopathy of posterior or anterior cervical chain or axillae bilaterally.  Gait antalgic gait MSK:  Mild tender with full range of motion and good stability and symmetric strength and tone of shoulders, elbows, wrist, hips bilaterally.     Ankle: Bilateral Patient has some mild arthritic changes Range of motion is full in all directions. Strength is 5/5 in all directions. Stable lateral and medial ligaments; squeeze test and kleiger test unremarkable; Patient does have decrease in disc dorsalis pedis and posterior tibialis pulses 1+ bilaterally but less on the right side than left. Able to walk 4 steps. Impression and Recommendations:     This case required medical decision making of moderate complexity.      Note: This dictation was prepared with Dragon dictation along with smaller phrase technology. Any transcriptional errors that result from this process are unintentional.

## 2016-09-03 ENCOUNTER — Encounter: Payer: Self-pay | Admitting: Family Medicine

## 2016-09-03 ENCOUNTER — Ambulatory Visit (INDEPENDENT_AMBULATORY_CARE_PROVIDER_SITE_OTHER): Payer: Medicare HMO | Admitting: Family Medicine

## 2016-09-03 DIAGNOSIS — M79605 Pain in left leg: Secondary | ICD-10-CM | POA: Diagnosis not present

## 2016-09-03 DIAGNOSIS — M79604 Pain in right leg: Secondary | ICD-10-CM

## 2016-09-03 NOTE — Patient Instructions (Signed)
Good to see you  I think that most of this is the blood flow in the legs and vascular docs will help For me lets see if this is arthritis.  pennsaid pinkie amount topically 2 times daily as needed. Stop if any bleeding occur.  Tylenol 325mg  3 times a day Tart cherry extract any dose at night can help as well.  See me again in 4 weeks if vascular did not correct it all.

## 2016-09-03 NOTE — Assessment & Plan Note (Signed)
I believe the patient's bilateral leg pain is actually secondary to more the peripheral vascular disease. Possible osteoarthritic changes and ankles but I do not feel that further workup is necessary. Given a trial of topical anti-inflammatories. We discussed icing regimen. Discussed with home exercises. Discussed proper shoes and over-the-counter orthotics. Patient will continue to remain somewhat active and will follow-up with vascular in the near future. Patient will follow-up with me again in 4 weeks and consider further imaging. If no significant improvement we can consider neurogenic claudication but I think this is low likelihood at this moment.

## 2016-09-05 ENCOUNTER — Ambulatory Visit (INDEPENDENT_AMBULATORY_CARE_PROVIDER_SITE_OTHER): Payer: Commercial Managed Care - HMO | Admitting: Orthopaedic Surgery

## 2016-09-19 ENCOUNTER — Other Ambulatory Visit: Payer: Self-pay | Admitting: Internal Medicine

## 2016-09-19 DIAGNOSIS — I739 Peripheral vascular disease, unspecified: Secondary | ICD-10-CM

## 2016-09-26 ENCOUNTER — Encounter: Payer: Self-pay | Admitting: Internal Medicine

## 2016-09-26 ENCOUNTER — Ambulatory Visit (HOSPITAL_COMMUNITY)
Admission: RE | Admit: 2016-09-26 | Discharge: 2016-09-26 | Disposition: A | Discharge status: Home / Self Care | Payer: Medicare HMO | Source: Ambulatory Visit | Attending: Internal Medicine | Admitting: Internal Medicine

## 2016-09-26 DIAGNOSIS — I739 Peripheral vascular disease, unspecified: Secondary | ICD-10-CM | POA: Diagnosis not present

## 2016-09-29 ENCOUNTER — Other Ambulatory Visit: Payer: Self-pay | Admitting: Internal Medicine

## 2016-10-09 ENCOUNTER — Telehealth: Payer: Self-pay | Admitting: Internal Medicine

## 2016-10-09 MED ORDER — LOVASTATIN 40 MG PO TABS: 40.0000 mg | ORAL_TABLET | Freq: Every day | ORAL | 0 refills | Status: DC

## 2016-10-09 NOTE — Telephone Encounter (Signed)
States patient is needing lovastatin sent to [facility].  States needs 3 month supply.  States pharmacy was to send request on 2/5 but I did not see anything. States patient is currently out of medication.

## 2016-10-09 NOTE — Telephone Encounter (Signed)
Medication refill sent to pharmacy  

## 2016-11-02 NOTE — Progress Notes (Signed)
[facility] [place] [place], [place] [address] Phone: [phone] Subjective:    I'm seeing this patient by the request  of:  [name], MD   CC: Bilateral ankle and knee pain  [id number]  Matthew Gray is a 83 y.o. male coming in with complaint of right medial ankle pain patient does have peripheral vascular disease and repeat in Doppler ultrasound did show that patient did have decreasing perfusion of the lower extremities right greater left. Patient was to see a vascular surgeon. Patient did not go.  States though he continues to have foot and ankle pain Patient states it seems to be worsening left is greater than right. Patient states it seems to be worse with activity and better with rest. Denies any back pain with it. Does feel that is sometimes his legs so do feel weak. States that the pain is enough that can stop him from activity.    Past Medical History:  Diagnosis Date  . ALLERGIC RHINITIS 10/27/2007  . ANKLE PAIN, RIGHT 11/30/2009  . ASTHMA 10/27/2007  . BACK PAIN 10/08/2009  . BENIGN PROSTATIC HYPERTROPHY 10/27/2007  . CHRONIC OBSTRUCTIVE PULMONARY DISEASE, ACUTE EXACERBATION 11/22/2007  . COLONIC POLYPS, HX OF 10/27/2007  . COPD 10/27/2007  . CORONARY ARTERY DISEASE 10/27/2007  . Dementia 01/13/2013  . DEPRESSION 11/30/2009  . FATIGUE 11/30/2009  . Hemoptysis 04/24/2009  . HYPERLIPIDEMIA 04/03/2007  . HYPERTENSION 04/03/2007  . MYALGIA 02/04/2008  . Obstructive chronic bronchitis without exacerbation ([place])   . Pain in Soft Tissues of Limb 02/04/2008  . PARESTHESIA 09/02/2010  . PERIPHERAL EDEMA 11/16/2009  . Peripheral neuropathy ([place]) 03/08/2012  . PERIPHERAL VASCULAR DISEASE 10/27/2007  . 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 RASH-NONVESICULAR 10/27/2007  . SCIATICA, LEFT 11/08/2009  . SHOULDER PAIN, LEFT 06/21/2009  . SINUSITIS- ACUTE-NOS 10/08/2009  . VITAMIN D DEFICIENCY 06/04/2010   Past Surgical History:  Procedure Laterality Date  . None       Social History   Social History  . Marital status: Divorced    Spouse name: N/A  . Number of children: 3  . Years of education: 12   Occupational History  .  Retired   Social History Main Topics  . Smoking status: Never Smoker  . Smokeless tobacco: Never Used     Comment: Quit ten years ago.  . Alcohol use No  . Drug use: No  . Sexual activity: No   Other Topics Concern  . None   Social History Narrative   He had 12 years of education, retired from [employer] at age 83, lives alone for 33 years, has 3 children   Patient right handed.   Caffeine-  Coffee three times a week.   Allergies  Allergen Reactions  . Atorvastatin Other (See Comments)    REACTION: myalgias  . Simvastatin Other (See Comments)    REACTION: myalgias  . Ace Inhibitors Other (See Comments)    unknown  . Penicillins Other (See Comments)    unknown   Family History  Problem Relation Age of Onset  . Cancer Mother     BREAST  . Cancer Father     LUNG  . Hypertension Sister     3 sisters  . Anuerysm Brother     died with  . Stroke Other   . Cancer Other     lung and breast  . Alcohol abuse Other     Past medical history, social, surgical and family history all reviewed  in electronic medical record.  No pertanent information unless stated regarding to the chief complaint.   Review of Systems: No , visual changes, nausea, vomiting, diarrhea, constipation, dizziness, abdominal pain, skin rash, fevers, chills, night sweats, weight loss, swollen lymph nodes,  chest pain, shortness of breath, mood changes.  Positive muscle aches, body aches, mild headaches  Objective  Blood pressure 140/90, pulse 96, height 5' 6.25" (1.683 m), weight 146 lb 12 oz (66.6 kg), SpO2 98 %. Systems examined below as of 11/03/16   Systems examined below as of 11/03/16 General: NAD A&O x3 mood, affect normal  HEENT: Pupils equal, extraocular movements intact no nystagmus Respiratory: not short of breath at rest or  with speaking Cardiovascular: No lower extremity edema, non tender Skin: Warm dry intact with no signs of infection or rash on extremities or on axial skeleton. Abdomen: Soft nontender, no masses Neuro: Cranial nerves  intact, neurovascularly intact in all extremities with 2+ DTRs and 1+ pulses of lower extremities.  Lymph: No lymphadenopathy appreciated today   Gait antalgic gait MSK:  Mild tender with full range of motion and good stability and symmetric strength and tone of shoulders, elbows, wrist, hips bilaterally. Arthritic changes of multiple joints  Back exam shows the patient does have some limitation in all planes of motion. Negative straight leg test and tightness of the hamstrings bilaterally. Neurovascularly seems to be intact distally.    Ankle: Bilateral Patient has some mild arthritic changes Mild decrease in dorsiflexion bilaterally Strength is 5/5 in all directions. Stable lateral and medial ligaments; squeeze test and kleiger test unremarkable; Patient does have decrease in disc dorsalis pedis and posterior tibialis pulses 1+ bilaterally but less on the right side than left. Able to walk 4 steps. Impression and Recommendations:     This case required medical decision making of moderate complexity.      Note: This dictation was prepared with Dragon dictation along with smaller phrase technology. Any transcriptional errors that result from this process are unintentional.

## 2016-11-03 ENCOUNTER — Encounter: Payer: Self-pay | Admitting: Family Medicine

## 2016-11-03 ENCOUNTER — Ambulatory Visit (INDEPENDENT_AMBULATORY_CARE_PROVIDER_SITE_OTHER): Payer: Medicare HMO | Admitting: Family Medicine

## 2016-11-03 ENCOUNTER — Ambulatory Visit (INDEPENDENT_AMBULATORY_CARE_PROVIDER_SITE_OTHER)
Admission: RE | Admit: 2016-11-03 | Discharge: 2016-11-03 | Disposition: A | Discharge status: Home / Self Care | Payer: Medicare HMO | Source: Ambulatory Visit | Attending: Family Medicine | Admitting: Family Medicine

## 2016-11-03 VITALS — BP 140/90 | HR 96 | Ht 66.25 in | Wt 146.8 lb

## 2016-11-03 DIAGNOSIS — M79605 Pain in left leg: Secondary | ICD-10-CM

## 2016-11-03 DIAGNOSIS — M47816 Spondylosis without myelopathy or radiculopathy, lumbar region: Secondary | ICD-10-CM | POA: Diagnosis not present

## 2016-11-03 DIAGNOSIS — M25572 Pain in left ankle and joints of left foot: Secondary | ICD-10-CM

## 2016-11-03 DIAGNOSIS — M79604 Pain in right leg: Secondary | ICD-10-CM | POA: Diagnosis not present

## 2016-11-03 DIAGNOSIS — M545 Low back pain, unspecified: Secondary | ICD-10-CM

## 2016-11-03 DIAGNOSIS — G6289 Other specified polyneuropathies: Secondary | ICD-10-CM | POA: Diagnosis not present

## 2016-11-03 DIAGNOSIS — M7989 Other specified soft tissue disorders: Secondary | ICD-10-CM | POA: Diagnosis not present

## 2016-11-03 MED ORDER — GABAPENTIN 100 MG PO CAPS: 200.0000 mg | ORAL_CAPSULE | Freq: Every day | ORAL | 3 refills | Status: DC

## 2016-11-03 NOTE — Patient Instructions (Addendum)
Good to see you  Lets get xray of ankle and back Gabapentin 100-200mg  at night Wear brace with a lot of walking.  Keep wearing good shoes.  [employer] "total support" online would be great  See me again in 4 weeks.

## 2016-11-03 NOTE — Assessment & Plan Note (Signed)
I believe likely secondary to more of a spinal stenosis. Patient started on gabapentin. X-rays of back pending. Patient differential is quite broad with his other comorbidities. We will continue to monitor. Follow-up again in 4 weeks. Possible advance imaging may be warranted at follow-up if no significant improvement or weakness noted.

## 2016-11-03 NOTE — Assessment & Plan Note (Signed)
Patient may be having more of a peripheral neuropathy. Also has been diagnosed with a polyneuropathy previously. Differential still includes peripheral vascular disease. Patient ABI did show the patient has had some mild progression since last exam. Patient though likely would not be a significant candidate for any type of intervention. Spinal stenosis also is within the differential. Patient was given gabapentin that I hope will be beneficial. The pain on findings we'll discuss further. Patient can be a candidate for EMG as well as needed. Discussed and follow-up.

## 2016-12-02 ENCOUNTER — Ambulatory Visit: Payer: Medicare HMO | Admitting: Family Medicine

## 2016-12-30 NOTE — Progress Notes (Deleted)
[facility] [address] [place], [place] [address] Phone: [phone] Subjective:    I'm seeing this patient by the request  of:  Biagio Borg, MD   CC: Bilateral ankle and knee pain  [id number]  Matthew Gray is a 83 y.o. male coming in with complaint of right medial ankle pain patient does have peripheral vascular disease and repeat in Doppler ultrasound did show that patient did have decreasing perfusion of the lower extremities right greater left. Patient was to see a vascular surgeon. Patient did not go.  States though he continues to have foot and ankle pain Patient states it seems to be worsening left is greater than right. Patient states it seems to be worse with activity and better with rest. Denies any back pain with it. Does feel that is sometimes his legs so do feel weak. States that the pain is enough that can stop him from activity.    Past Medical History:  Diagnosis Date  . ALLERGIC RHINITIS 10/27/2007  . ANKLE PAIN, RIGHT 11/30/2009  . ASTHMA 10/27/2007  . BACK PAIN 10/08/2009  . BENIGN PROSTATIC HYPERTROPHY 10/27/2007  . CHRONIC OBSTRUCTIVE PULMONARY DISEASE, ACUTE EXACERBATION 11/22/2007  . COLONIC POLYPS, HX OF 10/27/2007  . COPD 10/27/2007  . CORONARY ARTERY DISEASE 10/27/2007  . Dementia 01/13/2013  . DEPRESSION 11/30/2009  . FATIGUE 11/30/2009  . Hemoptysis 04/24/2009  . HYPERLIPIDEMIA 04/03/2007  . HYPERTENSION 04/03/2007  . MYALGIA 02/04/2008  . Obstructive chronic bronchitis without exacerbation ([place])   . Pain in Soft Tissues of Limb 02/04/2008  . PARESTHESIA 09/02/2010  . PERIPHERAL EDEMA 11/16/2009  . Peripheral neuropathy ([place]) 03/08/2012  . PERIPHERAL VASCULAR DISEASE 10/27/2007  . 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 RASH-NONVESICULAR 10/27/2007  . SCIATICA, LEFT 11/08/2009  . SHOULDER PAIN, LEFT 06/21/2009  . SINUSITIS- ACUTE-NOS 10/08/2009  . VITAMIN D DEFICIENCY 06/04/2010   Past Surgical History:  Procedure Laterality Date  . None      Social History   Social History  . Marital status: Divorced    Spouse name: N/A  . Number of children: 3  . Years of education: 12   Occupational History  .  Retired   Social History Main Topics  . Smoking status: Never Smoker  . Smokeless tobacco: Never Used     Comment: Quit ten years ago.  . Alcohol use No  . Drug use: No  . Sexual activity: No   Other Topics Concern  . Not on file   Social History Narrative   He had 12 years of education, retired from [employer] at age 59, lives alone for 32 years, has 3 children   Patient right handed.   Caffeine-  Coffee three times a week.   Allergies  Allergen Reactions  . Atorvastatin Other (See Comments)    REACTION: myalgias  . Simvastatin Other (See Comments)    REACTION: myalgias  . Ace Inhibitors Other (See Comments)    unknown  . Penicillins Other (See Comments)    unknown   Family History  Problem Relation Age of Onset  . Cancer Mother     BREAST  . Cancer Father     LUNG  . Hypertension Sister     3 sisters  . Anuerysm Brother     died with  . Stroke Other   . Cancer Other     lung and breast  . Alcohol abuse Other     Past medical history, social, surgical and family history  all reviewed in electronic medical record.  No pertanent information unless stated regarding to the chief complaint.   Review of Systems: No , visual changes, nausea, vomiting, diarrhea, constipation, dizziness, abdominal pain, skin rash, fevers, chills, night sweats, weight loss, swollen lymph nodes,  chest pain, shortness of breath, mood changes.  Positive muscle aches, body aches, mild headaches  Objective  There were no vitals taken for this visit. Systems examined below as of 12/30/16   Systems examined below as of 12/30/16 General: NAD A&O x3 mood, affect normal  HEENT: Pupils equal, extraocular movements intact no nystagmus Respiratory: not short of breath at rest or with speaking Cardiovascular: No lower extremity  edema, non tender Skin: Warm dry intact with no signs of infection or rash on extremities or on axial skeleton. Abdomen: Soft nontender, no masses Neuro: Cranial nerves  intact, neurovascularly intact in all extremities with 2+ DTRs and 1+ pulses of lower extremities.  Lymph: No lymphadenopathy appreciated today   Gait antalgic gait MSK:  Mild tender with full range of motion and good stability and symmetric strength and tone of shoulders, elbows, wrist, hips bilaterally. Arthritic changes of multiple joints  Back exam shows the patient does have some limitation in all planes of motion. Negative straight leg test and tightness of the hamstrings bilaterally. Neurovascularly seems to be intact distally.    Ankle: Bilateral Patient has some mild arthritic changes Mild decrease in dorsiflexion bilaterally Strength is 5/5 in all directions. Stable lateral and medial ligaments; squeeze test and kleiger test unremarkable; Patient does have decrease in disc dorsalis pedis and posterior tibialis pulses 1+ bilaterally but less on the right side than left. Able to walk 4 steps. Impression and Recommendations:     This case required medical decision making of moderate complexity.      Note: This dictation was prepared with Dragon dictation along with smaller phrase technology. Any transcriptional errors that result from this process are unintentional.

## 2016-12-31 ENCOUNTER — Ambulatory Visit: Payer: Medicare HMO | Admitting: Family Medicine

## 2017-01-03 ENCOUNTER — Other Ambulatory Visit: Payer: Self-pay | Admitting: Internal Medicine

## 2017-01-05 ENCOUNTER — Other Ambulatory Visit: Payer: Self-pay | Admitting: Internal Medicine

## 2017-02-09 ENCOUNTER — Other Ambulatory Visit: Payer: Self-pay | Admitting: Internal Medicine

## 2017-03-18 IMAGING — CT CT ABD-PELV W/ CM
2 of 5 series · 15 of 46 positions shown, 17 images · IV contrast (omnipaque)
Comparison: Radiographs earlier today raising concern for ileus
versus early small bowel obstruction.

CLINICAL DATA: Abdominal and back pain today.

EXAM:
CT ABDOMEN AND PELVIS WITH CONTRAST
TECHNIQUE: Multidetector CT imaging of the abdomen and pelvis was performed
using the standard protocol following bolus administration of
intravenous contrast.
CONTRAST:  100mL OMNIPAQUE IOHEXOL 300 MG/ML  SOLN

[Series 2: abd/pel with · axial · 0.74mm/px · z∈[-409,-49]mm · 12 of 82 slices shown, 14 images]
[im 5/82  soft-tissue]
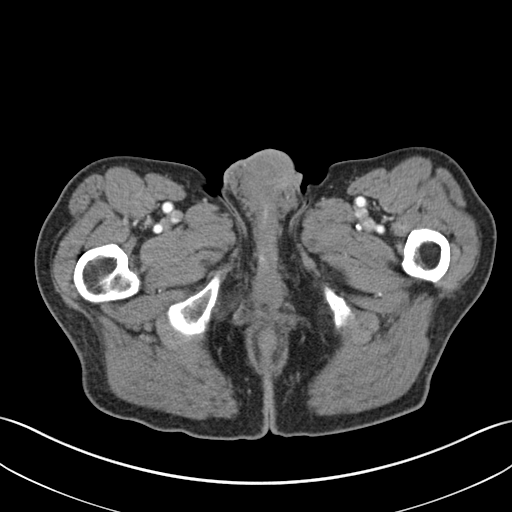
[im 5/82  bone]
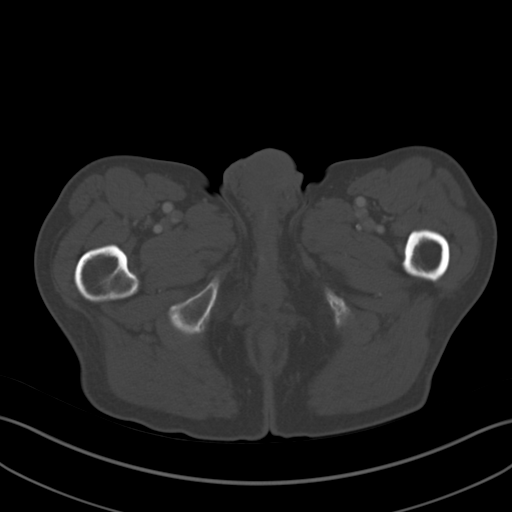
[im 15/82  soft-tissue]
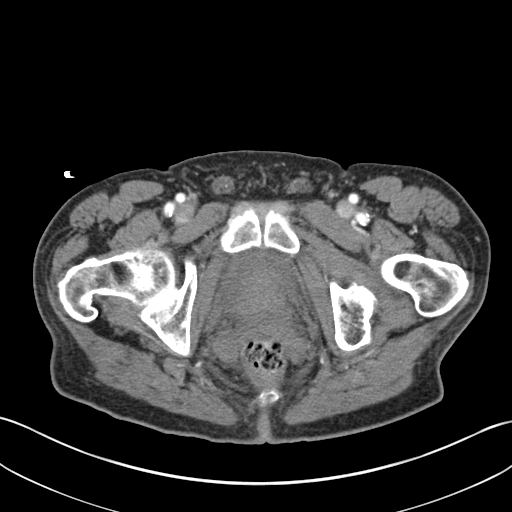
[im 20/82  soft-tissue]
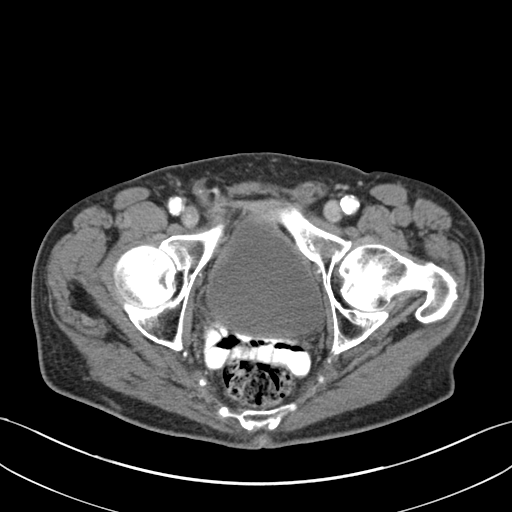
[im 24/82  soft-tissue]
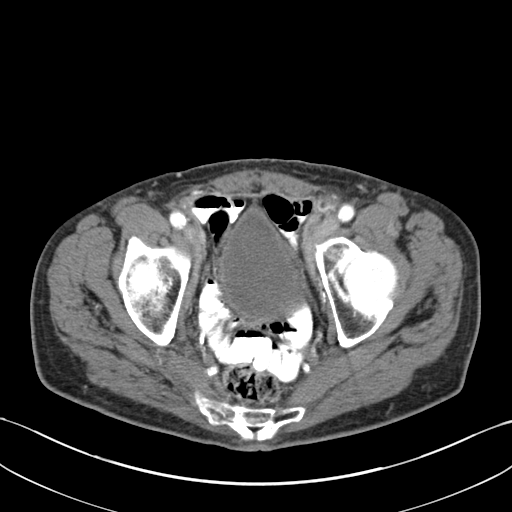
[im 34/82  soft-tissue]
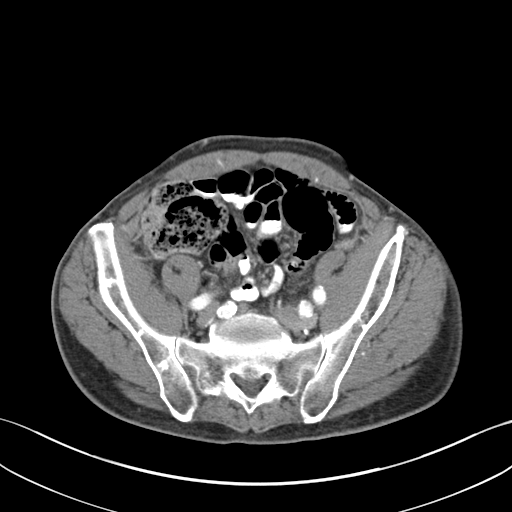
[im 39/82  soft-tissue]
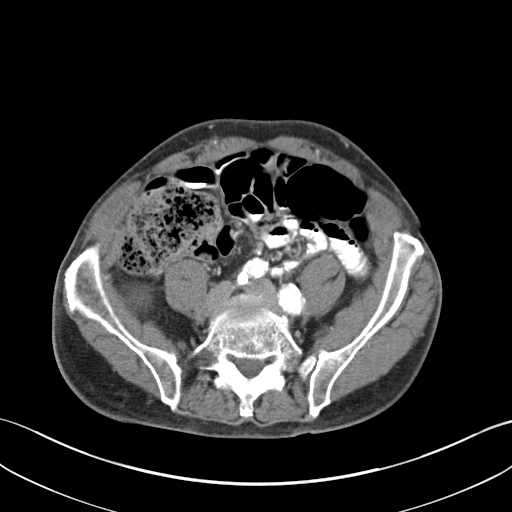
[im 43/82  soft-tissue]
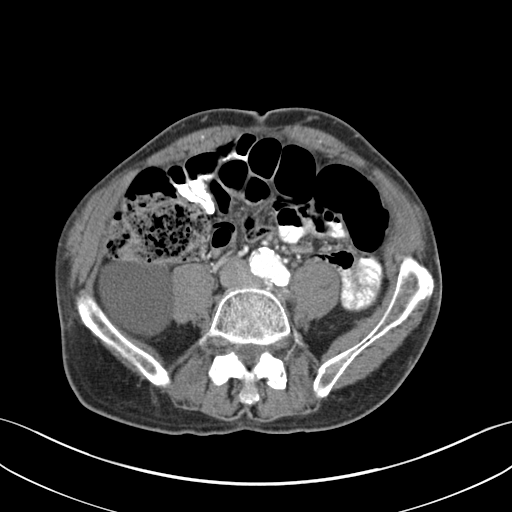
[im 53/82  soft-tissue]
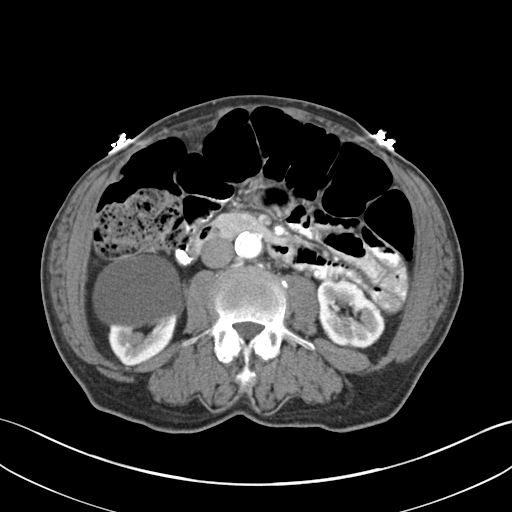
[im 58/82  soft-tissue]
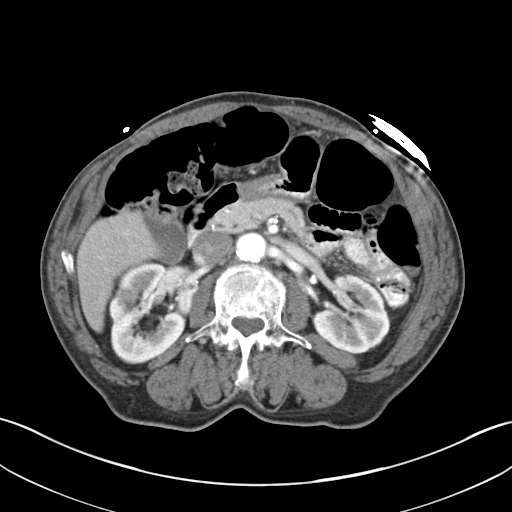
[im 58/82  bone]
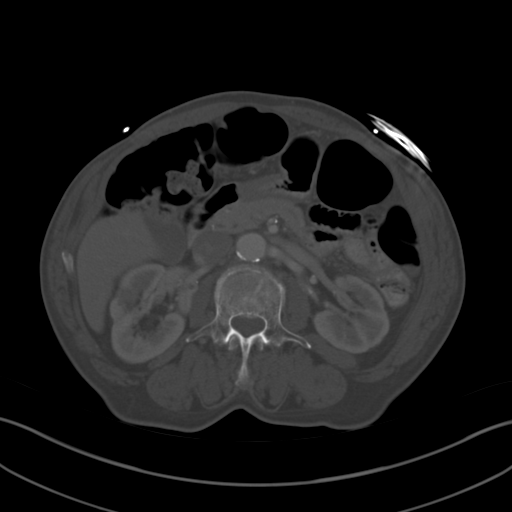
[im 62/82  soft-tissue]
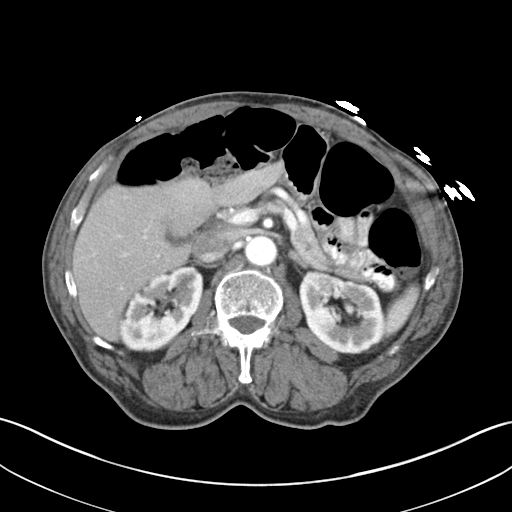
[im 72/82  soft-tissue]
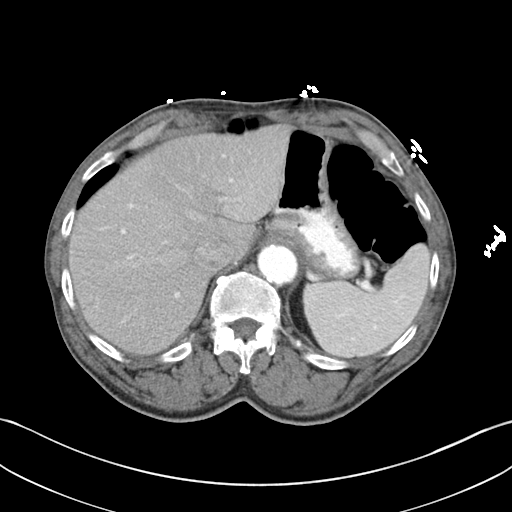
[im 77/82  soft-tissue]
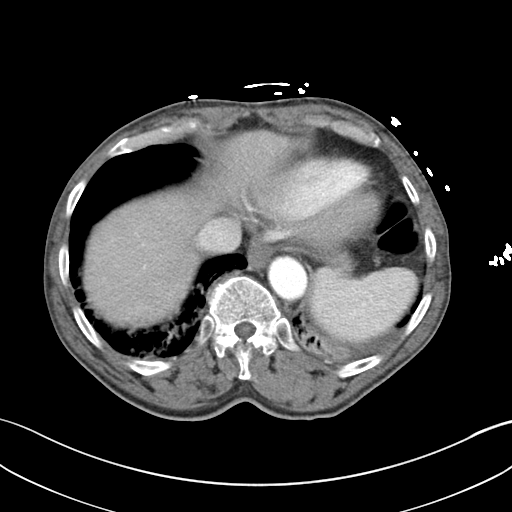

[Series 5: coronal a/|p · coronal · 0.68mm/px · 3 of 87 slices shown]
[im 29/87  soft-tissue]
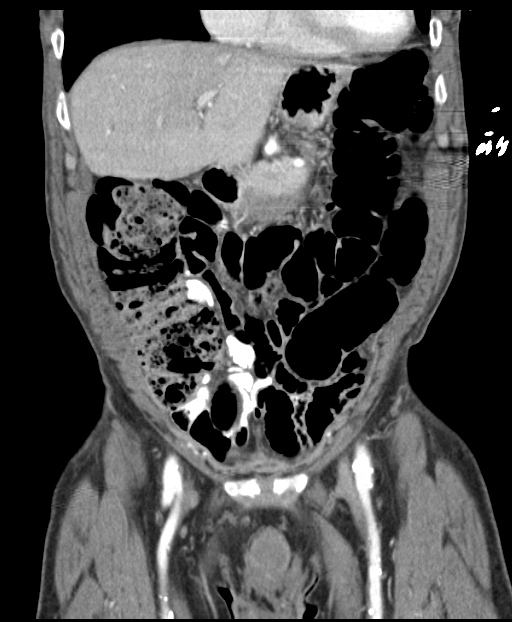
[im 39/87  soft-tissue]
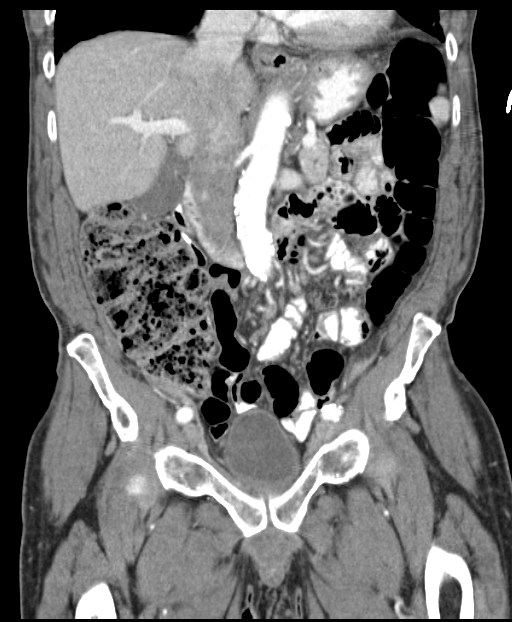
[im 48/87  soft-tissue]
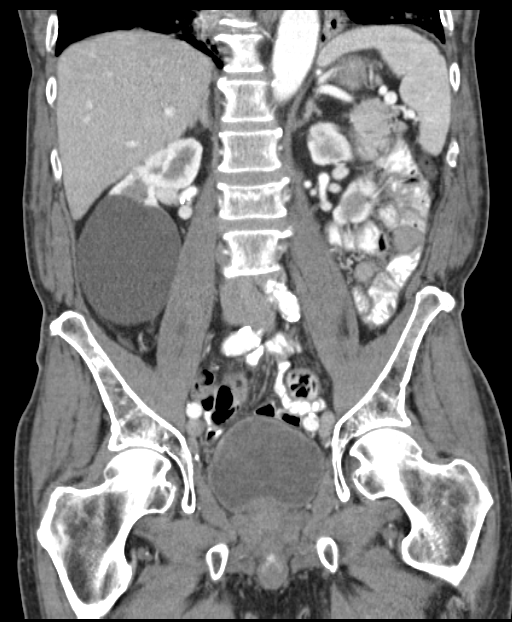

[15 of 46 positions shown; findings below may reference images not displayed]

FINDINGS: Lower chest: Dense consolidation in the left lower lobe, partially
included. Patchy consolidation in the lingula, also partially
included. Tree in bud opacities in the right lower lobe. Heart is
normal in size.

Liver: No focal lesion.

Hepatobiliary: Gallbladder physiologically distended, no calcified
stone. No biliary dilatation.

Pancreas: No ductal dilatation or inflammation. A few calcifications
in the region of the pancreatic head.

Spleen: Normal.

Adrenal glands: No nodule.

Kidneys: Symmetric renal enhancement and excretion. No
hydronephrosis. No perinephric stranding. Large cyst in the lower
right kidney measures 7.1 cm. Additional smaller cortical cysts in
both kidneys.

Stomach/Bowel: Stomach physiologically distended. There are no
dilated or thickened small bowel loops. Particularly, no evidence of
bowel obstruction. Large stool burden in the right colon, transverse
and descending colon are air-filled. Sigmoid colon is decompressed,
there is stool distending the rectum. There is no colonic wall
thickening. Portions of the appendix are visualized and normal.

Vascular/Lymphatic: No retroperitoneal adenopathy. Abdominal aorta
is normal in caliber. Moderate atherosclerosis of the abdominal
aorta and its branches without aneurysm.

Reproductive: Prostate gland prominent size.

Bladder: Physiologically distended, no wall thickening.

Other: No free air, free fluid, or intra-abdominal fluid collection.

Musculoskeletal: There are no acute or suspicious osseous
abnormalities. Mild compression deformity superior endplate of L2,
age indeterminate. Facet arthropathy in the lower lumbar spine.
IMPRESSION: 1. No small bowel obstruction. Colon distended with air and stool,
constipation pattern.
2. Dense left lower lobe consolidation, with additional patchy
opacities in the lingula and tree in bud opacities in the right
lower lobe. Findings most concerning for multifocal
pneumonia/bronchiolitis. Chest radiographic follow-up is recommended
to ensure clearing.
3. Mild compression deformity superior endplate of L2, age
indeterminate.

## 2017-04-21 ENCOUNTER — Ambulatory Visit (INDEPENDENT_AMBULATORY_CARE_PROVIDER_SITE_OTHER): Payer: Medicare HMO | Admitting: Internal Medicine

## 2017-04-21 ENCOUNTER — Other Ambulatory Visit (INDEPENDENT_AMBULATORY_CARE_PROVIDER_SITE_OTHER): Payer: Medicare HMO

## 2017-04-21 ENCOUNTER — Encounter: Payer: Self-pay | Admitting: Internal Medicine

## 2017-04-21 VITALS — BP 124/88 | HR 98 | Temp 98.6°F | Ht 66.25 in | Wt 147.0 lb

## 2017-04-21 DIAGNOSIS — G309 Alzheimer's disease, unspecified: Secondary | ICD-10-CM

## 2017-04-21 DIAGNOSIS — Z9189 Other specified personal risk factors, not elsewhere classified: Secondary | ICD-10-CM | POA: Insufficient documentation

## 2017-04-21 DIAGNOSIS — F0280 Dementia in other diseases classified elsewhere without behavioral disturbance: Secondary | ICD-10-CM

## 2017-04-21 DIAGNOSIS — E785 Hyperlipidemia, unspecified: Secondary | ICD-10-CM | POA: Diagnosis not present

## 2017-04-21 DIAGNOSIS — I10 Essential (primary) hypertension: Secondary | ICD-10-CM | POA: Diagnosis not present

## 2017-04-21 DIAGNOSIS — Z0001 Encounter for general adult medical examination with abnormal findings: Secondary | ICD-10-CM

## 2017-04-21 DIAGNOSIS — J449 Chronic obstructive pulmonary disease, unspecified: Secondary | ICD-10-CM

## 2017-04-21 LAB — CBC WITH DIFFERENTIAL/PLATELET
BASOS ABS: 0.1 10*3/uL (ref 0.0–0.1)
Basophils Relative: 0.8 % (ref 0.0–3.0)
EOS PCT: 2.9 % (ref 0.0–5.0)
Eosinophils Absolute: 0.2 10*3/uL (ref 0.0–0.7)
HEMATOCRIT: 40.3 % (ref 39.0–52.0)
Hemoglobin: 13.3 g/dL (ref 13.0–17.0)
LYMPHS ABS: 0.9 10*3/uL (ref 0.7–4.0)
Lymphocytes Relative: 12.5 % (ref 12.0–46.0)
MCHC: 33 g/dL (ref 30.0–36.0)
MCV: 81.1 fl (ref 78.0–100.0)
MONO ABS: 0.6 10*3/uL (ref 0.1–1.0)
Monocytes Relative: 7.7 % (ref 3.0–12.0)
Neutro Abs: 5.6 10*3/uL (ref 1.4–7.7)
Neutrophils Relative %: 76.1 % (ref 43.0–77.0)
Platelets: 183 10*3/uL (ref 150.0–400.0)
RBC: 4.97 Mil/uL (ref 4.22–5.81)
RDW: 15.9 % — ABNORMAL HIGH (ref 11.5–15.5)
WBC: 7.4 10*3/uL (ref 4.0–10.5)

## 2017-04-21 LAB — BASIC METABOLIC PANEL
BUN: 11 mg/dL (ref 6–23)
CO2: 36 meq/L — AB (ref 19–32)
Calcium: 9 mg/dL (ref 8.4–10.5)
Chloride: 97 mEq/L (ref 96–112)
Creatinine, Ser: 0.99 mg/dL (ref 0.40–1.50)
GFR: 93.37 mL/min (ref >60.00)
GLUCOSE: 87 mg/dL (ref 70–99)
POTASSIUM: 3.3 meq/L — AB (ref 3.5–5.1)
SODIUM: 138 meq/L (ref 135–145)

## 2017-04-21 LAB — TSH: TSH: 0.73 u[IU]/mL (ref 0.35–4.50)

## 2017-04-21 LAB — HEPATIC FUNCTION PANEL
ALBUMIN: 3.7 g/dL (ref 3.5–5.2)
ALK PHOS: 57 U/L (ref 39–117)
ALT: 10 U/L (ref 0–53)
AST: 15 U/L (ref 0–37)
Bilirubin, Direct: 0.2 mg/dL (ref 0.0–0.3)
TOTAL PROTEIN: 7.1 g/dL (ref 6.0–8.3)
Total Bilirubin: 0.5 mg/dL (ref 0.2–1.2)

## 2017-04-21 LAB — LIPID PANEL
CHOLESTEROL: 180 mg/dL (ref 0–200)
HDL: 66.3 mg/dL (ref >39.00)
LDL Cholesterol: 95 mg/dL (ref 0–99)
NonHDL: 113.4
Total CHOL/HDL Ratio: 3
Triglycerides: 93 mg/dL (ref 0.0–149.0)
VLDL: 18.6 mg/dL (ref 0.0–40.0)

## 2017-04-21 NOTE — Progress Notes (Signed)
Subjective:    Patient ID: Matthew Gray, male    DOB: 04-01-1934, 83 y.o.   MRN: [id number]  HPI  Here for wellness and f/u;  Overall doing ok;  Pt denies Chest pain, worsening SOB, DOE, wheezing, orthopnea, PND, worsening LE edema, palpitations, dizziness or syncope.  Pt denies neurological change such as new headache, facial or extremity weakness.  Pt denies polydipsia, polyuria, or low sugar symptoms. Pt states overall good compliance with treatment and medications, good tolerability, and has been trying to follow appropriate diet.  Pt denies worsening depressive symptoms, suicidal ideation or panic. No fever, night sweats, wt loss, loss of appetite, or other constitutional symptoms.  Pt states good ability with ADL's, has low fall risk, home safety reviewed and adequate, no other significant changes in hearing or vision, and not active with exercise.  Wife is concerned about driving and has DMV form to limit his driving to daytime short specific trips. Declines flu shot. Past Medical History:  Diagnosis Date  . ALLERGIC RHINITIS 10/27/2007  . ANKLE PAIN, RIGHT 11/30/2009  . ASTHMA 10/27/2007  . BACK PAIN 10/08/2009  . BENIGN PROSTATIC HYPERTROPHY 10/27/2007  . CHRONIC OBSTRUCTIVE PULMONARY DISEASE, ACUTE EXACERBATION 11/22/2007  . COLONIC POLYPS, HX OF 10/27/2007  . COPD 10/27/2007  . CORONARY ARTERY DISEASE 10/27/2007  . Dementia 01/13/2013  . DEPRESSION 11/30/2009  . FATIGUE 11/30/2009  . Hemoptysis 04/24/2009  . HYPERLIPIDEMIA 04/03/2007  . HYPERTENSION 04/03/2007  . MYALGIA 02/04/2008  . Obstructive chronic bronchitis without exacerbation ([place])   . Pain in Soft Tissues of Limb 02/04/2008  . PARESTHESIA 09/02/2010  . PERIPHERAL EDEMA 11/16/2009  . Peripheral neuropathy 03/08/2012  . PERIPHERAL VASCULAR DISEASE 10/27/2007  . 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 RASH-NONVESICULAR 10/27/2007  . SCIATICA, LEFT 11/08/2009  . SHOULDER PAIN, LEFT 06/21/2009  . SINUSITIS- ACUTE-NOS 10/08/2009  . VITAMIN D  DEFICIENCY 06/04/2010   Past Surgical History:  Procedure Laterality Date  . None      reports that he has never smoked. He has never used smokeless tobacco. He reports that he does not drink alcohol or use drugs. family history includes Alcohol abuse in his other; Anuerysm in his brother; Cancer in his father, mother, and other; Hypertension in his sister; Stroke in his other. Allergies  Allergen Reactions  . Atorvastatin Other (See Comments)    REACTION: myalgias  . Simvastatin Other (See Comments)    REACTION: myalgias  . Ace Inhibitors Other (See Comments)    unknown  . Penicillins Other (See Comments)    unknown   Current Outpatient Prescriptions on File Prior to Visit  Medication Sig Dispense Refill  . acetaminophen (TYLENOL) 500 MG tablet Take 2 tablets (1,000 mg total) by mouth every 6 (six) hours as needed for moderate pain. 30 tablet 0  . cholestyramine light (PREVALITE) 4 g packet Take 1 packet (4 g total) by mouth 2 (two) times daily as needed. 180 packet 3  . Diclofenac Sodium (PENNSAID) 2 % SOLN Place 1 application onto the skin 2 (two) times daily as needed. 2 g 2  . donepezil (ARICEPT) 10 MG tablet Take 1 tablet (10 mg total) by mouth at bedtime. Annual appt is due in August must see MD for refills 90 tablet 0  . fexofenadine (ALLEGRA) 180 MG tablet TAKE 1 TABLET BY MOUTH DAILY FOR ALLERGIES 90 tablet 0  . gabapentin (NEURONTIN) 100 MG capsule Take 2 capsules (200 mg total) by mouth at bedtime. 60 capsule 3  . lovastatin (MEVACOR) 40  MG tablet TAKE 1 TABLET BY MOUTH DAILY 90 tablet 0  . traMADol (ULTRAM) 50 MG tablet Take 1 tablet (50 mg total) by mouth 3 (three) times daily as needed (back and knee pain). 90 tablet 3  . triamcinolone (NASACORT AQ) 55 MCG/ACT AERO nasal inhaler Place 2 sprays into the nose daily. 1 Inhaler 12   No current facility-administered medications on file prior to visit.    Review of Systems Constitutional: Negative for other unusual  diaphoresis, sweats, appetite or weight changes HENT: Negative for other worsening hearing loss, ear pain, facial swelling, mouth sores or neck stiffness.   Eyes: Negative for other worsening pain, redness or other visual disturbance.  Respiratory: Negative for other stridor or swelling Cardiovascular: Negative for other palpitations or other chest pain  Gastrointestinal: Negative for worsening diarrhea or loose stools, blood in stool, distention or other pain Genitourinary: Negative for hematuria, flank pain or other change in urine volume.  Musculoskeletal: Negative for myalgias or other joint swelling.  Skin: Negative for other color change, or other wound or worsening drainage.  Neurological: Negative for other syncope or numbness. Hematological: Negative for other adenopathy or swelling Psychiatric/Behavioral: Negative for hallucinations, other worsening agitation, SI, self-injury, or new decreased concentration All other system neg per pt    Objective:   Physical Exam BP 124/88   Pulse 98   Temp 98.6 F (37 C) (Oral)   Ht 5' 6.25" (1.683 m)   Wt 147 lb (66.7 kg)   SpO2 99%   BMI 23.55 kg/m  VS noted,  Constitutional: Pt is oriented to person, place, and time. Appears well-developed and well-nourished, in no significant distress and comfortable Head: Normocephalic and atraumatic  Eyes: Conjunctivae and EOM are normal. Pupils are equal, round, and reactive to light Right Ear: External ear normal without discharge Left Ear: External ear normal without discharge Nose: Nose without discharge or deformity Mouth/Throat: Oropharynx is without other ulcerations and moist  Neck: Normal range of motion. Neck supple. No JVD present. No tracheal deviation present or significant neck LA or mass Cardiovascular: Normal rate, regular rhythm, normal heart sounds and intact distal pulses.   Pulmonary/Chest: WOB normal and breath sounds without rales or wheezing  Abdominal: Soft. Bowel sounds  are normal. NT. No HSM  Musculoskeletal: Normal range of motion. Exhibits no edema Lymphadenopathy: Has no other cervical adenopathy.  Neurological: Pt is alert and oriented to person, place only. Pt has normal reflexes. No cranial nerve deficit. Motor grossly intact, Gait intact Skin: Skin is warm and dry. No rash noted or new ulcerations Psychiatric:  Has normal mood and affect. Behavior is normal without agitation No other exam findings     Assessment & Plan:

## 2017-04-21 NOTE — Patient Instructions (Signed)
Please continue all other medications as before, and refills have been done if requested.  Please have the pharmacy call with any other refills you may need.  Please continue your efforts at being more active, low cholesterol diet, and weight control.  You are otherwise up to date with prevention measures today.  Please keep your appointments with your specialists as you may have planned  Your forms were filled out today  Please go to the LAB in the Basement (turn left off the elevator) for the tests to be done today  You will be contacted by phone if any changes need to be made immediately.  Otherwise, you will receive a letter about your results with an explanation, but please check with MyChart first.  Please remember to sign up for MyChart if you have not done so, as this will be important to you in the future with finding out test results, communicating by private email, and scheduling acute appointments online when needed.  Please return in 6 months, or sooner if needed

## 2017-04-22 ENCOUNTER — Other Ambulatory Visit: Payer: Self-pay | Admitting: Internal Medicine

## 2017-04-22 ENCOUNTER — Telehealth: Payer: Self-pay

## 2017-04-22 ENCOUNTER — Encounter: Payer: Self-pay | Admitting: Internal Medicine

## 2017-04-22 LAB — URINALYSIS, ROUTINE W REFLEX MICROSCOPIC
Bilirubin Urine: NEGATIVE
HGB URINE DIPSTICK: NEGATIVE
Ketones, ur: NEGATIVE
LEUKOCYTES UA: NEGATIVE
NITRITE: NEGATIVE
RBC / HPF: NONE SEEN (ref 0–?)
SPECIFIC GRAVITY, URINE: 1.015 (ref 1.000–1.030)
Urine Glucose: NEGATIVE
Urobilinogen, UA: 1 (ref 0.0–1.0)
WBC, UA: NONE SEEN (ref 0–?)
pH: 6 (ref 5.0–8.0)

## 2017-04-22 MED ORDER — POTASSIUM CHLORIDE ER 10 MEQ PO TBCR: 10.0000 meq | EXTENDED_RELEASE_TABLET | Freq: Every day | ORAL | 3 refills | Status: DC

## 2017-04-22 NOTE — Telephone Encounter (Signed)
-----   Message from Biagio Borg, MD sent at 04/22/2017 12:36 PM EDT ----- Letter sent, cont same tx except  The test results show that your current treatment is OK, except there is mild low potassium most likely related to the fluid pill part of your blood pressure medication.   We need to start a potassium pill - 1 per day and this will be sent.  You should also hear form the office.Redmond Baseman to please inform pt's FAMILY (pt with poor memory), I will do rx

## 2017-04-22 NOTE — Telephone Encounter (Signed)
Called pt, LVM.   

## 2017-04-23 ENCOUNTER — Other Ambulatory Visit: Payer: Self-pay | Admitting: Internal Medicine

## 2017-04-23 NOTE — Assessment & Plan Note (Addendum)
D/w pt and family, ok for pt to be restricted in driving to daytime short distances  In addition to the time spent performing CPE, I spent an additional 25 minutes face to face,in which greater than 50% of this time was spent in counseling and coordination of care for patient's acute illness as documented, including the differential dx, tx, further evaluation and other management of driving ability, dementia, COPD, HTN, and HLD

## 2017-04-23 NOTE — Assessment & Plan Note (Signed)
stable overall by history and exam, recent data reviewed with pt, and pt to continue medical treatment as before,  to f/u any worsening symptoms or concerns BP Readings from Last 3 Encounters:  04/21/17 124/88  11/03/16 140/90  09/03/16 104/74

## 2017-04-23 NOTE — Assessment & Plan Note (Signed)
stable overall by history and exam, and pt to continue medical treatment as before,  to f/u any worsening symptoms or concerns 

## 2017-04-23 NOTE — Assessment & Plan Note (Signed)

## 2017-04-23 NOTE — Assessment & Plan Note (Signed)
Lab Results  Component Value Date   [place] 89 04/21/2017  stable overall by history and exam, recent data reviewed with pt, and pt to continue medical treatment as before,  to f/u any worsening symptoms or concerns

## 2017-04-23 NOTE — Assessment & Plan Note (Signed)
Mild to mod, gradual worsening, to cont family support and same tx,  to f/u any worsening symptoms or concerns

## 2017-04-29 ENCOUNTER — Other Ambulatory Visit: Payer: Self-pay | Admitting: *Deleted

## 2017-04-29 MED ORDER — GABAPENTIN 100 MG PO CAPS: 200.0000 mg | ORAL_CAPSULE | Freq: Every day | ORAL | 0 refills | Status: DC

## 2017-04-29 NOTE — Telephone Encounter (Signed)
Refill request for gabapentin 100mg  BID sent into pharmacy.

## 2017-05-14 DIAGNOSIS — H524 Presbyopia: Secondary | ICD-10-CM | POA: Diagnosis not present

## 2017-05-20 DIAGNOSIS — H52209 Unspecified astigmatism, unspecified eye: Secondary | ICD-10-CM | POA: Diagnosis not present

## 2017-05-20 DIAGNOSIS — H5213 Myopia, bilateral: Secondary | ICD-10-CM | POA: Diagnosis not present

## 2017-05-20 DIAGNOSIS — H524 Presbyopia: Secondary | ICD-10-CM | POA: Diagnosis not present

## 2017-05-21 ENCOUNTER — Other Ambulatory Visit: Payer: Self-pay | Admitting: Internal Medicine

## 2017-06-08 ENCOUNTER — Other Ambulatory Visit: Payer: Self-pay | Admitting: Internal Medicine

## 2017-07-03 ENCOUNTER — Other Ambulatory Visit: Payer: Self-pay | Admitting: Pharmacist

## 2017-07-03 NOTE — Patient Outreach (Signed)
Incoming call from Matthew Gray in response to the [facility] Medication Adherence Campaign. Speak with patient. HIPAA identifiers verified and verbal consent received.  Matthew Gray reports that his sister fills his weekly pillbox for him every weekend. Reports that she keeps the pill bottles and manages his medications for him. Matthew Gray reports that he takes his medications everyday as she has prepared them for her. Discuss with patient the importance of medication adherence. Patient thanks me for my call. He gives permission and asks that I call his sister, Matthew Gray, to follow up to confirm that he is taking his cholesterol medication as directed. Patient reports that he is unable to find his sister's phone number at this time. However, note that caregiver's number is listed in Epic.  PLAN:  Will call to follow up with patient's sister/caregiver, using number listed in Epic, to confirm that patient is taking his lovastatin.   Harlow Asa, PharmD, [place] Management [phone]

## 2017-07-03 NOTE — Patient Outreach (Signed)
Call to follow up with patient's sister/caregiver, Davy Pique, using number listed in Epic, per patient request/permission to confirm that patient is taking his lovastatin. Left a HIPAA compliant message on the sister's voicemail. If have not heard from caregiver by next week, will give her another call at that time.  Harlow Asa, PharmD, [place] Management [phone]

## 2017-07-10 ENCOUNTER — Other Ambulatory Visit: Payer: Self-pay | Admitting: Pharmacist

## 2017-07-10 NOTE — Patient Outreach (Signed)
Call to follow up with patient's sister/caregiver, Davy Pique, using number listed in Epic, per patient request/permission to confirm that patient is taking his lovastatin. Outreach attempt #2. Left a HIPAA compliant message on the sister's voicemail. If have not heard from caregiver by next week, will give her another call at that time.  Harlow Asa, PharmD, [place] Management [phone]

## 2017-07-15 ENCOUNTER — Other Ambulatory Visit: Payer: Self-pay | Admitting: Pharmacist

## 2017-07-15 NOTE — Patient Outreach (Signed)
Receive a voicemail from patient's sister/caregiver, Matthew Gray, returning my call back and requesting a call back today. Called and speak with Matthew Gray. HIPAA identifiers verified.  Matthew Gray reports that she manages Matthew Gray's medications. Reports that she has been filling his pillbox for him every Sunday for some time. Reports that she has noticed that he will sometimes miss doses and that on one occasion, he took multiple days of medication in the same day. Reports that for now she has addressed this by telling him to only take his medication when she calls him. She reports that she is a Pharmacist, hospital and that she calls him every morning during her planning break and then again at night. Reports that each time she calls she waits while he finds the correct day in his pillbox to take. Reports that she will continue with this method for now, but has also considered purchasing a pillbox that has an alarm and locks. Discuss with patient medication adherence. Matthew Gray reports that Matthew Gray also has many other family members that live close to him and help him, including another sister next door and a daughter also next door.  Discuss with Matthew Gray the cost savings of using [employer] order.  Matthew Gray denies any further medication questions/concerns at this time. Reports that she will call me if she has questions in the future.  Will close pharmacy episode at this time.  Harlow Asa, PharmD, [place] Management [phone]

## 2017-07-24 ENCOUNTER — Other Ambulatory Visit: Payer: Self-pay | Admitting: *Deleted

## 2017-07-24 ENCOUNTER — Other Ambulatory Visit: Payer: Self-pay | Admitting: Pharmacist

## 2017-07-24 ENCOUNTER — Telehealth: Payer: Self-pay | Admitting: Internal Medicine

## 2017-07-24 DIAGNOSIS — G309 Alzheimer's disease, unspecified: Principal | ICD-10-CM

## 2017-07-24 DIAGNOSIS — F0280 Dementia in other diseases classified elsewhere without behavioral disturbance: Secondary | ICD-10-CM

## 2017-07-24 NOTE — Telephone Encounter (Signed)
Copied from [place] #[id number]. Topic: General - Other >> Jul 24, 2017  9:27 AM Cecelia Byars, NT wrote: Reason for [place]  said patients  sister and caregiver  called and says he is refusing to turn  on the heat in the home and patient is refusing to take his medicine sister would like for Dr Jenny Reichmann to call patient, he using a space heater bathroom only and stays in in bathroom, eats in there as well her name is Davy Pique [phone] information is in epic as well

## 2017-07-24 NOTE — Patient Outreach (Signed)
Receive a voicemail from patient's sister/caregiver from yesterday, requesting a call back. Called and speak with Matthew Gray. HIPAA identifiers verified.  Matthew Gray reports that she is concerned about her brother. Reports that the patient is refusing to turn the heat on in his home. Reports that he stays in his bathroom with a space heater. Reports that he stays in the bathroom most of the day and eats his meals there to stay warm. Reports that he is also now refusing to take all of his medications. Advise Matthew Gray to reach out to the patient's PCP to let him know about this situation. Let patient know that I will also reach out to one of our Social Workers for advice about how to best help Matthew Gray.  Place a coordination of care call to [place].  PLAN  1) Sister/caregiver to reach out to patient's PCP to let him know about her concerns for her brother's living situation and that he is refusing to take his medications.  2) I will also reach out to patient's PCP, Matthew Gray, to let him know what patient's sister/caregiver has reported and to ask him to reach out to patient and caregiver.   3) Will send a referral to [place].  Harlow Asa, PharmD, [place] Management [phone]

## 2017-07-24 NOTE — Telephone Encounter (Deleted)
Copied from [place] #[id number]. Topic: General - Other >> Jul 24, 2017  9:27 AM Cecelia Byars, NT wrote: Reason for [place]  said patients  sister and caregiver  called and says he is refusing to turn  on the heat in the home and patient is refusing to take his medicine sister would like for Dr Jenny Reichmann to call patient, he using a space heater bathroom only and stays in in bathroom, eats in there as well her name is Davy Pique [phone] information is in epic as well

## 2017-07-24 NOTE — Patient Outreach (Signed)
Outreach call to patient's PCP, Dr. Gwynn Burly office. Leave message with Marcie Bal in Dr. Gwynn Burly office letting him know about the call that I received from patient's sister/caregiver, Davy Pique. Report that Ms. Jimmye Norman  is concerned about her brother. She reports that the patient is refusing to turn the heat on in his home. Reports that he stays in his bathroom with a space heater. Reports that he stays in the bathroom most of the day and eats his meals there to stay warm. Reports that he is also now refusing to take all of his medications.   Request that Dr. Jenny Reichmann reach out to Mr. Trombetta and his caregiver.  Harlow Asa, PharmD, [place] Management [phone]

## 2017-07-24 NOTE — Telephone Encounter (Signed)
Copied from [place] #[id number]. Topic: General - Other >> Jul 24, 2017  9:27 AM Matthew Gray, NT wrote: Reason for [place]  said patients  sister and caregiver  called and says he is refusing to turn  on the heat in the home and patient is refusing to take his medicine sister would like for Dr Jenny Reichmann to call patient, he using a space heater bathroom only and stays in in bathroom, eats in there as well her name is Matthew Gray [phone] information is in epic as well also [name] from [facility] care management called [phone]

## 2017-07-24 NOTE — Telephone Encounter (Signed)
Not sure what to say about this  Unfortunately I am unable to do phone consultations with patients due to my work conditions required by my employer   All I can say is that if he refusing his meds and becomes worse some way or appears to be confused, he should go to ER ( or consider OV to check this)

## 2017-07-24 NOTE — Patient Outreach (Signed)
Perform Epic chart review. Note that Dr. Jenny Reichmann has responded to my message from this morning and suggested that patient/caregive consider scheduling an office visit.   Call to follow up with patient's sister/caregiver, Matthew Gray. Ms. Matthew Gray confirms that she has scheduled an office visit for Matthew Gray with Dr. Jenny Reichmann for next Tuesday, 12/4. Let Ms. Matthew Gray know that I have also sent a referral to our [place] Worker Eduard Clos and that Marcie Bal will be calling to follow up with her and the patient next week. Ms. Matthew Gray expresses appreciation for this referral.  Ms. Matthew Gray denies any further questions at this time.   PLAN  Will follow up again next week.  Harlow Asa, PharmD, [place] Management [phone]

## 2017-07-24 NOTE — Telephone Encounter (Signed)
Spoke to pt sister and pt has an appt on 07/28/2017

## 2017-07-24 NOTE — Patient Outreach (Signed)
[facility]) Care Management  07/24/2017  Matthew Gray 02-07-1934 [id number]   CSW received referral 07/24/17  to assist with concerns related to patient refusing to take his meds, cognitive impairment and refusing to turn on his heat.  CSW has spoken with Matthew Gray, [facility], as well as briefly with patient's sister regarding this scenario. CSW was advised by [facility] that patent will be seen by PCP on next Tuesday regarding these issues.  CSW has also attempted to reach patient by phone and left message for return call.  Patient's sister, Matthew Gray,   was working and requested to call CSW back. CSW awaits callback to further assess concerns and needs.   Eduard Clos, MSW, [place] Worker  [place] [phone]

## 2017-07-24 NOTE — Telephone Encounter (Signed)
Please advise 

## 2017-07-28 ENCOUNTER — Ambulatory Visit (INDEPENDENT_AMBULATORY_CARE_PROVIDER_SITE_OTHER)
Admission: RE | Admit: 2017-07-28 | Discharge: 2017-07-28 | Disposition: A | Discharge status: Home / Self Care | Payer: Medicare HMO | Source: Ambulatory Visit | Attending: Internal Medicine | Admitting: Internal Medicine

## 2017-07-28 ENCOUNTER — Ambulatory Visit: Payer: Self-pay | Admitting: *Deleted

## 2017-07-28 ENCOUNTER — Encounter: Payer: Self-pay | Admitting: Internal Medicine

## 2017-07-28 ENCOUNTER — Ambulatory Visit: Payer: Medicare HMO | Admitting: Internal Medicine

## 2017-07-28 VITALS — BP 156/92 | HR 84 | Temp 98.4°F | Ht 66.25 in | Wt 137.0 lb

## 2017-07-28 DIAGNOSIS — R05 Cough: Secondary | ICD-10-CM | POA: Diagnosis not present

## 2017-07-28 DIAGNOSIS — I10 Essential (primary) hypertension: Secondary | ICD-10-CM | POA: Diagnosis not present

## 2017-07-28 DIAGNOSIS — E876 Hypokalemia: Secondary | ICD-10-CM

## 2017-07-28 DIAGNOSIS — R059 Cough, unspecified: Secondary | ICD-10-CM

## 2017-07-28 MED ORDER — LOSARTAN POTASSIUM-HCTZ 50-12.5 MG PO TABS: 1.0000 | ORAL_TABLET | Freq: Every day | ORAL | 3 refills | Status: DC

## 2017-07-28 NOTE — Progress Notes (Signed)
Subjective:    Patient ID: Matthew Gray, male    DOB: Aug 24, 1934, 83 y.o.   MRN: [id number]  HPI  Here to f/u with family with whom he lives; hx limited by dementia and non specific; pt states "just dont feel good" but o/w cant be specific.  Has had a cough and feeling warm recently, family states they dont see where he has been ill or feverish; cough is non prod and Pt denies chest pain, increased sob or doe, wheezing, orthopnea, PND, increased LE swelling, palpitations, dizziness or syncope.  Pt denies new neurological symptoms such as new headache, or facial or extremity weakness or numbness   Pt denies polydipsia, polyuria.   Past Medical History:  Diagnosis Date  . ALLERGIC RHINITIS 10/27/2007  . ANKLE PAIN, RIGHT 11/30/2009  . ASTHMA 10/27/2007  . BACK PAIN 10/08/2009  . BENIGN PROSTATIC HYPERTROPHY 10/27/2007  . CHRONIC OBSTRUCTIVE PULMONARY DISEASE, ACUTE EXACERBATION 11/22/2007  . COLONIC POLYPS, HX OF 10/27/2007  . COPD 10/27/2007  . CORONARY ARTERY DISEASE 10/27/2007  . Dementia 01/13/2013  . DEPRESSION 11/30/2009  . FATIGUE 11/30/2009  . Hemoptysis 04/24/2009  . HYPERLIPIDEMIA 04/03/2007  . HYPERTENSION 04/03/2007  . MYALGIA 02/04/2008  . Obstructive chronic bronchitis without exacerbation ([employer])   . Pain in Soft Tissues of Limb 02/04/2008  . PARESTHESIA 09/02/2010  . PERIPHERAL EDEMA 11/16/2009  . Peripheral neuropathy 03/08/2012  . PERIPHERAL VASCULAR DISEASE 10/27/2007  . 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 RASH-NONVESICULAR 10/27/2007  . SCIATICA, LEFT 11/08/2009  . SHOULDER PAIN, LEFT 06/21/2009  . SINUSITIS- ACUTE-NOS 10/08/2009  . VITAMIN D DEFICIENCY 06/04/2010   Past Surgical History:  Procedure Laterality Date  . None      reports that  has never smoked. he has never used smokeless tobacco. He reports that he does not drink alcohol or use drugs. family history includes Alcohol abuse in his other; Anuerysm in his brother; Cancer in his father, mother, and other; Hypertension in his  sister; Stroke in his other. Allergies  Allergen Reactions  . Atorvastatin Other (See Comments)    REACTION: myalgias  . Simvastatin Other (See Comments)    REACTION: myalgias  . Ace Inhibitors Other (See Comments)    unknown  . Penicillins Other (See Comments)    unknown   Current Outpatient Medications on File Prior to Visit  Medication Sig Dispense Refill  . acetaminophen (TYLENOL) 500 MG tablet Take 2 tablets (1,000 mg total) by mouth every 6 (six) hours as needed for moderate pain. 30 tablet 0  . cholestyramine light (PREVALITE) 4 g packet Take 1 packet (4 g total) by mouth 2 (two) times daily as needed. 180 packet 3  . Diclofenac Sodium (PENNSAID) 2 % SOLN Place 1 application onto the skin 2 (two) times daily as needed. 2 g 2  . donepezil (ARICEPT) 10 MG tablet TAKE 1 TABLET BY MOUTH EVERY NIGHT AT BEDTIME 90 tablet 3  . fexofenadine (ALLEGRA) 180 MG tablet TAKE 1 TABLET BY MOUTH DAILY FOR ALLERGIES 90 tablet 0  . gabapentin (NEURONTIN) 100 MG capsule Take 2 capsules (200 mg total) by mouth at bedtime. 180 capsule 0  . lovastatin (MEVACOR) 40 MG tablet TAKE 1 TABLET BY MOUTH DAILY 90 tablet 3  . potassium chloride (K-DUR) 10 MEQ tablet Take 1 tablet (10 mEq total) by mouth daily. 90 tablet 3  . traMADol (ULTRAM) 50 MG tablet Take 1 tablet (50 mg total) by mouth 3 (three) times daily as needed (back and knee pain). 90 tablet  3  . triamcinolone (NASACORT AQ) 55 MCG/ACT AERO nasal inhaler Place 2 sprays into the nose daily. 1 Inhaler 12   No current facility-administered medications on file prior to visit.    Review of Systems  Constitutional: Negative for other unusual diaphoresis or sweats HENT: Negative for ear discharge or swelling Eyes: Negative for other worsening visual disturbances Respiratory: Negative for stridor or other swelling  Gastrointestinal: Negative for worsening distension or other blood Genitourinary: Negative for retention or other urinary  change Musculoskeletal: Negative for other MSK pain or swelling Skin: Negative for color change or other new lesions Neurological: Negative for worsening tremors and other numbness  Psychiatric/Behavioral: Negative for worsening agitation or other fatigue All other system neg per pt    Objective:   Physical Exam BP (!) 156/92   Pulse 84   Temp 98.4 F (36.9 C) (Oral)   Ht 5' 6.25" (1.683 m)   Wt 137 lb (62.1 kg)   SpO2 96%   BMI 21.95 kg/m  VS noted, non toxic or ill appearing Constitutional: Pt appears in NAD HENT: Head: NCAT.  Right Ear: External ear normal.  Left Ear: External ear normal.  Eyes: . Pupils are equal, round, and reactive to light. Conjunctivae and EOM are normal Nose: without d/c or deformity Neck: Neck supple. Gross normal ROM Cardiovascular: Normal rate and regular rhythm.   Pulmonary/Chest: Effort normal and breath sounds without rales or wheezing.  Abd:  Soft, NT, ND, + BS, no organomegaly Neurological: Pt is alert. At baseline orientation, motor grossly intact Skin: Skin is warm. No rashes, other new lesions, no LE edema Psychiatric: Pt behavior is normal without agitation  No other exam findings        Assessment & Plan:

## 2017-07-28 NOTE — Patient Instructions (Signed)
Ok to stop the Diovan HCT  Please take all new medication as prescribed - the losartan HCT  Please continue all other medications as before and make sure to take EVERY day  Please have the pharmacy call with any other refills you may need.  Please continue your efforts at being more active, low cholesterol diet, and weight control.  Please keep your appointments with your specialists as you may have planned  Please go to the XRAY Department in the Basement (go straight as you get off the elevator) for the x-ray testing  You will be contacted by phone if any changes need to be made immediately.  Otherwise, you will receive a letter about your results with an explanation, but please check with MyChart first.  Please remember to sign up for MyChart if you have not done so, as this will be important to you in the future with finding out test results, communicating by private email, and scheduling acute appointments online when needed.

## 2017-07-29 ENCOUNTER — Other Ambulatory Visit: Payer: Self-pay | Admitting: *Deleted

## 2017-07-29 DIAGNOSIS — E876 Hypokalemia: Secondary | ICD-10-CM | POA: Insufficient documentation

## 2017-07-29 NOTE — Assessment & Plan Note (Signed)
Mild, for K restart as has not been taking lately, and declines lab f/u today

## 2017-07-29 NOTE — Assessment & Plan Note (Signed)
Exam benign and not actually clear if he has significant cough, for cxr but o/w follow clinically with expectant management

## 2017-07-29 NOTE — Assessment & Plan Note (Signed)
Ok to stop and change the diovan HCT that has been recalled, to Losartan HCT 50/12.5

## 2017-07-30 ENCOUNTER — Telehealth: Payer: Self-pay

## 2017-07-30 NOTE — Telephone Encounter (Signed)
[name] sister has been informed and expressed understanding.  

## 2017-07-30 NOTE — Telephone Encounter (Signed)
-----   Message from Biagio Borg, MD sent at 07/30/2017  8:23 AM EST ----- Ok for Matthew Gray to let pt Family know - cxr essentially without change, no acute problems found that would need further evaluation (no lung tumor, chf or pna)

## 2017-07-31 ENCOUNTER — Other Ambulatory Visit: Payer: Self-pay | Admitting: Pharmacist

## 2017-07-31 ENCOUNTER — Other Ambulatory Visit: Payer: Self-pay | Admitting: *Deleted

## 2017-07-31 ENCOUNTER — Ambulatory Visit: Payer: Self-pay | Admitting: *Deleted

## 2017-07-31 NOTE — Patient Outreach (Signed)
Call to follow up with patient's sister/caregiver, Davy Pique. Left a HIPAA compliant message on the sister's voicemail. If have not heard from caregiver by next week, will give her another call at that time.  Harlow Asa, PharmD, [place] Management [phone]

## 2017-07-31 NOTE — Patient Outreach (Signed)
[facility]) Care Management  07/31/2017  Branson Kranz 14-Nov-1933 [id number]   CSW attempted a phone outreach attempt today for follow up from initial contact made with patient's sister. Another HIPPA compliant voice mail was left today . CSW will attempt again in 2-3 days if no return call is received.   Eduard Clos, MSW, [place] Worker  [place] [phone]

## 2017-08-03 ENCOUNTER — Other Ambulatory Visit: Payer: Self-pay | Admitting: *Deleted

## 2017-08-03 NOTE — Patient Outreach (Signed)
[facility] [facility]) Care Management  08/03/2017  Matthew Gray 30-Oct-1933 [id number]   CSW contacted patient's sister today to get an update on how he is doing. She reports that since going to see his PCP last week that he has been taking his medications and more compliant. She also reports she was informed by their sister who lives next door in a similar home, that the sister concurs their homes get rather warm and thus the heat is not needed as often.  She seems to feel he is doing better and declines CSW assessment/visit at this time, She is interested in getting some resources which we discussed that may be of benefit to helping him and with him at home.  CSW will mail materials to her (at her address) and close CSW consult due to no further concerns related to her brother.  CSW encouraged her to contact CSW, PCP, or [facility] office if further concerns/needs arise. CSW will advise PCP and [facility] team of above.   Eduard Clos, MSW, [place] Worker  [place] [phone]

## 2017-08-07 ENCOUNTER — Other Ambulatory Visit: Payer: Self-pay | Admitting: Pharmacist

## 2017-08-07 NOTE — Patient Outreach (Signed)
Call to follow up with patient's sister/caregiver, Matthew Gray. Called and spoke with patient. HIPAA identifiers verified and verbal consent received.  Ms. Matthew Gray reports that things have been going better for [name] since she took him to the appointment with Dr. Jenny Reichmann. Reports that patient has now been taking his medications again and now has his heat back on. Reports that she is back to filling his weekly pillbox and calling him twice a day to remind him to take his medications.   Ms. Matthew Gray denies further medication questions at this time. Will close pharmacy episode.  Harlow Asa, PharmD, [place] Management [phone]

## 2017-09-07 ENCOUNTER — Telehealth: Payer: Self-pay

## 2017-09-07 DIAGNOSIS — R413 Other amnesia: Secondary | ICD-10-CM

## 2017-09-07 NOTE — Telephone Encounter (Signed)
Ok, this is done 

## 2017-09-07 NOTE — Telephone Encounter (Signed)
Spoke with pt's sister, she would like to proceed with the referral to neurology. However, she stated the paperwork needed to be turned in on the 10th in order for him to keep his license. She would like for neurology to see him and hopefully still get the paperwork completed. Please advise.

## 2017-09-07 NOTE — Addendum Note (Signed)
Addended by: Biagio Borg on: 09/07/2017 05:21 PM   Modules accepted: Orders

## 2017-09-17 ENCOUNTER — Encounter: Payer: Self-pay | Admitting: Neurology

## 2017-10-22 ENCOUNTER — Encounter: Payer: Self-pay | Admitting: Internal Medicine

## 2017-10-22 ENCOUNTER — Other Ambulatory Visit (INDEPENDENT_AMBULATORY_CARE_PROVIDER_SITE_OTHER): Payer: Medicare HMO

## 2017-10-22 ENCOUNTER — Ambulatory Visit (INDEPENDENT_AMBULATORY_CARE_PROVIDER_SITE_OTHER): Payer: Medicare HMO | Admitting: Internal Medicine

## 2017-10-22 ENCOUNTER — Ambulatory Visit (INDEPENDENT_AMBULATORY_CARE_PROVIDER_SITE_OTHER)
Admission: RE | Admit: 2017-10-22 | Discharge: 2017-10-22 | Disposition: A | Discharge status: Home / Self Care | Payer: Medicare HMO | Source: Ambulatory Visit | Attending: Internal Medicine | Admitting: Internal Medicine

## 2017-10-22 VITALS — BP 126/82 | HR 67 | Temp 97.9°F | Ht 66.25 in | Wt 137.0 lb

## 2017-10-22 DIAGNOSIS — F0280 Dementia in other diseases classified elsewhere without behavioral disturbance: Secondary | ICD-10-CM

## 2017-10-22 DIAGNOSIS — G309 Alzheimer's disease, unspecified: Secondary | ICD-10-CM

## 2017-10-22 DIAGNOSIS — E559 Vitamin D deficiency, unspecified: Secondary | ICD-10-CM

## 2017-10-22 DIAGNOSIS — R634 Abnormal weight loss: Secondary | ICD-10-CM

## 2017-10-22 DIAGNOSIS — Z0000 Encounter for general adult medical examination without abnormal findings: Secondary | ICD-10-CM | POA: Diagnosis not present

## 2017-10-22 DIAGNOSIS — R5383 Other fatigue: Secondary | ICD-10-CM

## 2017-10-22 DIAGNOSIS — Z0001 Encounter for general adult medical examination with abnormal findings: Secondary | ICD-10-CM

## 2017-10-22 DIAGNOSIS — J449 Chronic obstructive pulmonary disease, unspecified: Secondary | ICD-10-CM | POA: Diagnosis not present

## 2017-10-22 DIAGNOSIS — J9811 Atelectasis: Secondary | ICD-10-CM | POA: Diagnosis not present

## 2017-10-22 LAB — CBC WITH DIFFERENTIAL/PLATELET
BASOS ABS: 0 10*3/uL (ref 0.0–0.1)
Basophils Relative: 0.5 % (ref 0.0–3.0)
EOS ABS: 0.1 10*3/uL (ref 0.0–0.7)
Eosinophils Relative: 0.6 % (ref 0.0–5.0)
HCT: 43.6 % (ref 39.0–52.0)
HEMOGLOBIN: 14.5 g/dL (ref 13.0–17.0)
LYMPHS ABS: 0.5 10*3/uL — AB (ref 0.7–4.0)
Lymphocytes Relative: 5.6 % — ABNORMAL LOW (ref 12.0–46.0)
MCHC: 33.2 g/dL (ref 30.0–36.0)
MCV: 80.4 fl (ref 78.0–100.0)
MONO ABS: 0.4 10*3/uL (ref 0.1–1.0)
Monocytes Relative: 4.2 % (ref 3.0–12.0)
Neutro Abs: 8.1 10*3/uL — ABNORMAL HIGH (ref 1.4–7.7)
Platelets: 261 10*3/uL (ref 150.0–400.0)
RBC: 5.42 Mil/uL (ref 4.22–5.81)
RDW: 15.1 % (ref 11.5–15.5)
WBC: 9 10*3/uL (ref 4.0–10.5)

## 2017-10-22 MED ORDER — ASPIRIN EC 81 MG PO TBEC: 81.0000 mg | DELAYED_RELEASE_TABLET | Freq: Every day | ORAL | 11 refills | Status: AC

## 2017-10-22 MED ORDER — DONEPEZIL HCL 10 MG PO TABS: 10.0000 mg | ORAL_TABLET | Freq: Every day | ORAL | 3 refills | Status: DC

## 2017-10-22 NOTE — Progress Notes (Signed)
Subjective:    Patient ID: Matthew Gray, male    DOB: 05-Aug-1934, 84 y.o.   MRN: [id number]  HPI  Here for wellness and f/u;  Overall doing ok;  Pt denies Chest pain, worsening SOB, DOE, wheezing, orthopnea, PND, worsening LE edema, palpitations, dizziness or syncope.  Pt denies neurological change such as new headache, facial or extremity weakness.  Pt denies polydipsia, polyuria, or low sugar symptoms. Pt states overall good compliance with treatment and medications, good tolerability, and has been trying to follow appropriate diet.  Pt denies worsening depressive symptoms, suicidal ideation or panic. No fever, night sweats, wt loss, loss of appetite, or other constitutional symptoms.  Pt states good ability with ADL's, has low fall risk, home safety reviewed and adequate, no other significant changes in hearing or vision, and not active with exercise.  Has lost significant wt recently but not clear why Wt Readings from Last 3 Encounters:  10/22/17 137 lb (62.1 kg)  07/28/17 137 lb (62.1 kg)  04/21/17 147 lb (66.7 kg)  Declines flu shot.  Does c/o ongoing fatigue, but denies signficant daytime hypersomnolence.  Past Medical History:  Diagnosis Date  . ALLERGIC RHINITIS 10/27/2007  . ANKLE PAIN, RIGHT 11/30/2009  . ASTHMA 10/27/2007  . BACK PAIN 10/08/2009  . BENIGN PROSTATIC HYPERTROPHY 10/27/2007  . CHRONIC OBSTRUCTIVE PULMONARY DISEASE, ACUTE EXACERBATION 11/22/2007  . COLONIC POLYPS, HX OF 10/27/2007  . COPD 10/27/2007  . CORONARY ARTERY DISEASE 10/27/2007  . Dementia 01/13/2013  . DEPRESSION 11/30/2009  . FATIGUE 11/30/2009  . Hemoptysis 04/24/2009  . HYPERLIPIDEMIA 04/03/2007  . HYPERTENSION 04/03/2007  . MYALGIA 02/04/2008  . Obstructive chronic bronchitis without exacerbation ([place])   . Pain in Soft Tissues of Limb 02/04/2008  . PARESTHESIA 09/02/2010  . PERIPHERAL EDEMA 11/16/2009  . Peripheral neuropathy 03/08/2012  . PERIPHERAL VASCULAR DISEASE 10/27/2007  . 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  RASH-NONVESICULAR 10/27/2007  . SCIATICA, LEFT 11/08/2009  . SHOULDER PAIN, LEFT 06/21/2009  . SINUSITIS- ACUTE-NOS 10/08/2009  . VITAMIN D DEFICIENCY 06/04/2010   Past Surgical History:  Procedure Laterality Date  . None      reports that  has never smoked. he has never used smokeless tobacco. He reports that he does not drink alcohol or use drugs. family history includes Alcohol abuse in his other; Anuerysm in his brother; Cancer in his father, mother, and other; Hypertension in his sister; Stroke in his other. Allergies  Allergen Reactions  . Atorvastatin Other (See Comments)    REACTION: myalgias  . Simvastatin Other (See Comments)    REACTION: myalgias  . Ace Inhibitors Other (See Comments)    unknown  . Penicillins Other (See Comments)    unknown   Current Outpatient Medications on File Prior to Visit  Medication Sig Dispense Refill  . cholestyramine light (PREVALITE) 4 g packet Take 1 packet (4 g total) by mouth 2 (two) times daily as needed. 180 packet 3  . Diclofenac Sodium (PENNSAID) 2 % SOLN Place 1 application onto the skin 2 (two) times daily as needed. 2 g 2   No current facility-administered medications on file prior to visit.    Review of Systems Constitutional: Negative for other unusual diaphoresis, sweats, appetite or weight changes HENT: Negative for other worsening hearing loss, ear pain, facial swelling, mouth sores or neck stiffness.   Eyes: Negative for other worsening pain, redness or other visual disturbance.  Respiratory: Negative for other stridor or swelling Cardiovascular: Negative for other palpitations or other chest pain  Gastrointestinal: Negative for worsening diarrhea or loose stools, blood in stool, distention or other pain Genitourinary: Negative for hematuria, flank pain or other change in urine volume.  Musculoskeletal: Negative for myalgias or other joint swelling.  Skin: Negative for other color change, or other wound or worsening drainage.   Neurological: Negative for other syncope or numbness. Hematological: Negative for other adenopathy or swelling Psychiatric/Behavioral: Negative for hallucinations, other worsening agitation, SI, self-injury, or new decreased concentration All other system neg per pt    Objective:   Physical Exam BP 126/82   Pulse 67   Temp 97.9 F (36.6 C) (Oral)   Ht 5' 6.25" (1.683 m)   Wt 137 lb (62.1 kg)   SpO2 97%   BMI 21.95 kg/m  VS noted,  Constitutional: Pt is oriented to person, place, and time. Appears well-developed and well-nourished, in no significant distress and comfortable Head: Normocephalic and atraumatic  Eyes: Conjunctivae and EOM are normal. Pupils are equal, round, and reactive to light Right Ear: External ear normal without discharge Left Ear: External ear normal without discharge Nose: Nose without discharge or deformity Mouth/Throat: Oropharynx is without other ulcerations and moist  Neck: Normal range of motion. Neck supple. No JVD present. No tracheal deviation present or significant neck LA or mass Cardiovascular: Normal rate, regular rhythm, normal heart sounds and intact distal pulses.   Pulmonary/Chest: WOB normal and breath sounds without rales or wheezing  Abdominal: Soft. Bowel sounds are normal. NT. No HSM  Musculoskeletal: Normal range of motion. Exhibits no edema Lymphadenopathy: Has no other cervical adenopathy.  Neurological: Pt is alert and oriented to person, place, and time. Pt has normal reflexes. No cranial nerve deficit. Motor grossly intact, Gait intact Skin: Skin is warm and dry. No rash noted or new ulcerations Psychiatric:  Has normal mood and affect. Behavior is normal without agitation No other exam findings Lab Results  Component Value Date   WBC 9.0 10/22/2017   HGB 14.5 10/22/2017   HCT 43.6 10/22/2017   PLT 261.0 10/22/2017   GLUCOSE 65 (L) 10/22/2017   CHOL 240 (H) 10/22/2017   TRIG 59.0 10/22/2017   HDL 71.70 10/22/2017    LDLDIRECT 131.5 06/24/2006   LDLCALC 157 (H) 10/22/2017   ALT 7 10/22/2017   AST 13 10/22/2017   NA 141 10/22/2017   K 4.1 10/22/2017   CL 96 10/22/2017   CREATININE 1.08 10/22/2017   BUN 15 10/22/2017   CO2 30 10/22/2017   TSH 0.69 10/22/2017   PSA 1.07 03/08/2012      Assessment & Plan:

## 2017-10-22 NOTE — Patient Instructions (Addendum)
Please restart the Aricept, and Aspirin 81 mg per day  Please continue all other medications as before, and refills have been done if requested.  Please have the pharmacy call with any other refills you may need.  Please continue your efforts at being more active, low cholesterol diet, and weight control.  You are otherwise up to date with prevention measures today.  Please keep your appointments with your specialists as you may have planned  Please go to the XRAY Department in the Basement (go straight as you get off the elevator) for the x-ray testing  Please go to the LAB in the Basement (turn left off the elevator) for the tests to be done today  You will be contacted by phone if any changes need to be made immediately.  Otherwise, you will receive a letter about your results with an explanation, but please check with MyChart first.  Please remember to sign up for MyChart if you have not done so, as this will be important to you in the future with finding out test results, communicating by private email, and scheduling acute appointments online when needed.  Please return in 6 months, or sooner if needed, with Lab testing done 3-5 days before

## 2017-10-23 ENCOUNTER — Telehealth: Payer: Self-pay

## 2017-10-23 ENCOUNTER — Encounter: Payer: Self-pay | Admitting: Internal Medicine

## 2017-10-23 LAB — LIPID PANEL
CHOL/HDL RATIO: 3
Cholesterol: 240 mg/dL — ABNORMAL HIGH (ref 0–200)
HDL: 71.7 mg/dL (ref >39.00)
LDL Cholesterol: 157 mg/dL — ABNORMAL HIGH (ref 0–99)
NonHDL: 168.37
Triglycerides: 59 mg/dL (ref 0.0–149.0)
VLDL: 11.8 mg/dL (ref 0.0–40.0)

## 2017-10-23 LAB — BASIC METABOLIC PANEL
BUN: 15 mg/dL (ref 6–23)
CALCIUM: 10 mg/dL (ref 8.4–10.5)
CO2: 30 meq/L (ref 19–32)
Chloride: 96 mEq/L (ref 96–112)
Creatinine, Ser: 1.08 mg/dL (ref 0.40–1.50)
GFR: 84.34 mL/min (ref >60.00)
GLUCOSE: 65 mg/dL — AB (ref 70–99)
Potassium: 4.1 mEq/L (ref 3.5–5.1)
SODIUM: 141 meq/L (ref 135–145)

## 2017-10-23 LAB — HEPATIC FUNCTION PANEL
ALT: 7 U/L (ref 0–53)
AST: 13 U/L (ref 0–37)
Albumin: 3.8 g/dL (ref 3.5–5.2)
Alkaline Phosphatase: 76 U/L (ref 39–117)
BILIRUBIN DIRECT: 0.2 mg/dL (ref 0.0–0.3)
BILIRUBIN TOTAL: 1 mg/dL (ref 0.2–1.2)
Total Protein: 8.1 g/dL (ref 6.0–8.3)

## 2017-10-23 LAB — VITAMIN D 25 HYDROXY (VIT D DEFICIENCY, FRACTURES): VITD: 40.72 ng/mL (ref 30.00–100.00)

## 2017-10-23 LAB — TSH: TSH: 0.69 u[IU]/mL (ref 0.35–4.50)

## 2017-10-23 NOTE — Telephone Encounter (Signed)
[name] sister has been informed and expressed understanding.  

## 2017-10-23 NOTE — Telephone Encounter (Signed)
-----   Message from Biagio Borg, MD sent at 10/22/2017  5:23 PM EST ----- Ok for Shray Hunley to call wife -   CXR does not show any acute problem or cancer, only COPD

## 2017-10-24 ENCOUNTER — Encounter: Payer: Self-pay | Admitting: Internal Medicine

## 2017-10-24 NOTE — Assessment & Plan Note (Signed)
stable overall by history and exam, recent data reviewed with wife, and pt to continue medical treatment as before,  to f/u any worsening symptoms or concerns

## 2017-10-24 NOTE — Assessment & Plan Note (Signed)
Etiology unclear, for lab f/u today as planned

## 2017-10-24 NOTE — Assessment & Plan Note (Signed)

## 2017-10-24 NOTE — Assessment & Plan Note (Addendum)
Etiology unclear, for labs as ordered, also cxr

## 2017-10-24 NOTE — Assessment & Plan Note (Signed)
Also for vit d check,  to f/u any worsening symptoms or concerns

## 2017-10-27 LAB — PROTEIN ELECTROPHORESIS, SERUM
ALBUMIN ELP: 3.9 g/dL (ref 3.8–4.8)
ALPHA 1: 0.4 g/dL — AB (ref 0.2–0.3)
Alpha 2: 0.9 g/dL (ref 0.5–0.9)
BETA 2: 0.5 g/dL (ref 0.2–0.5)
Beta Globulin: 0.4 g/dL (ref 0.4–0.6)
Gamma Globulin: 1.5 g/dL (ref 0.8–1.7)
TOTAL PROTEIN: 7.6 g/dL (ref 6.1–8.1)

## 2017-12-02 ENCOUNTER — Ambulatory Visit: Payer: Medicare HMO | Admitting: Neurology

## 2017-12-14 ENCOUNTER — Ambulatory Visit: Payer: Medicare HMO | Admitting: Neurology

## 2017-12-18 ENCOUNTER — Ambulatory Visit: Payer: Medicare HMO | Admitting: Neurology

## 2018-01-04 ENCOUNTER — Encounter: Payer: Self-pay | Admitting: Internal Medicine

## 2018-01-04 ENCOUNTER — Ambulatory Visit (INDEPENDENT_AMBULATORY_CARE_PROVIDER_SITE_OTHER): Payer: Medicare HMO | Admitting: Internal Medicine

## 2018-01-04 VITALS — BP 146/104 | HR 63 | Temp 97.8°F | Ht 66.25 in | Wt 136.0 lb

## 2018-01-04 DIAGNOSIS — J309 Allergic rhinitis, unspecified: Secondary | ICD-10-CM | POA: Diagnosis not present

## 2018-01-04 DIAGNOSIS — I10 Essential (primary) hypertension: Secondary | ICD-10-CM | POA: Diagnosis not present

## 2018-01-04 DIAGNOSIS — F0281 Dementia in other diseases classified elsewhere with behavioral disturbance: Secondary | ICD-10-CM

## 2018-01-04 DIAGNOSIS — G309 Alzheimer's disease, unspecified: Secondary | ICD-10-CM

## 2018-01-04 MED ORDER — AMLODIPINE BESYLATE 5 MG PO TABS: 5.0000 mg | ORAL_TABLET | Freq: Every day | ORAL | 3 refills | Status: DC

## 2018-01-04 MED ORDER — LORATADINE 10 MG PO TABS: 10.0000 mg | ORAL_TABLET | Freq: Every day | ORAL | 3 refills | Status: DC

## 2018-01-04 MED ORDER — DONEPEZIL HCL 10 MG PO TABS: 10.0000 mg | ORAL_TABLET | Freq: Every day | ORAL | 3 refills | Status: DC

## 2018-01-04 MED ORDER — EZETIMIBE 10 MG PO TABS: 10.0000 mg | ORAL_TABLET | Freq: Every day | ORAL | 3 refills | Status: DC

## 2018-01-04 MED ORDER — QUETIAPINE FUMARATE 25 MG PO TABS: 25.0000 mg | ORAL_TABLET | Freq: Two times a day (BID) | ORAL | 11 refills | Status: DC

## 2018-01-04 NOTE — Assessment & Plan Note (Signed)
Ok for restart aricept 10, also for seroquel 25 bid

## 2018-01-04 NOTE — Assessment & Plan Note (Signed)
Uncontrolled, for amlodipine 5 qd

## 2018-01-04 NOTE — Progress Notes (Signed)
Subjective:    Patient ID: Matthew Gray, male    DOB: Mar 02, 1934, 84 y.o.   MRN: [id number]  HPI  Here with 2 family members/daughters who relate pt has not been complaint with any meds recently, and on discovering this they plan supervise his med taking;  Dementia overall stable symptomatically with gradual worsening, and assoc with behavioral changes of agitation but  Not hallucinations, paranoia.  Does have several wks ongoing nasal allergy symptoms with clearish congestion, itch and sneezing, without fever, pain, ST, cough, swelling or wheezing.   BP has been newly increased recently as well.  Pt denies chest pain, increased sob or doe, wheezing, orthopnea, PND, increased LE swelling, palpitations, dizziness or syncope.   Pt denies polydipsia, polyuria Past Medical History:  Diagnosis Date  . ALLERGIC RHINITIS 10/27/2007  . ANKLE PAIN, RIGHT 11/30/2009  . ASTHMA 10/27/2007  . BACK PAIN 10/08/2009  . BENIGN PROSTATIC HYPERTROPHY 10/27/2007  . CHRONIC OBSTRUCTIVE PULMONARY DISEASE, ACUTE EXACERBATION 11/22/2007  . COLONIC POLYPS, HX OF 10/27/2007  . COPD 10/27/2007  . CORONARY ARTERY DISEASE 10/27/2007  . Dementia 01/13/2013  . DEPRESSION 11/30/2009  . FATIGUE 11/30/2009  . Hemoptysis 04/24/2009  . HYPERLIPIDEMIA 04/03/2007  . HYPERTENSION 04/03/2007  . MYALGIA 02/04/2008  . Obstructive chronic bronchitis without exacerbation ([place])   . Pain in Soft Tissues of Limb 02/04/2008  . PARESTHESIA 09/02/2010  . PERIPHERAL EDEMA 11/16/2009  . Peripheral neuropathy 03/08/2012  . PERIPHERAL VASCULAR DISEASE 10/27/2007  . 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 RASH-NONVESICULAR 10/27/2007  . SCIATICA, LEFT 11/08/2009  . SHOULDER PAIN, LEFT 06/21/2009  . SINUSITIS- ACUTE-NOS 10/08/2009  . VITAMIN D DEFICIENCY 06/04/2010   Past Surgical History:  Procedure Laterality Date  . None      reports that he has never smoked. He has never used smokeless tobacco. He reports that he does not drink alcohol or use drugs. family  history includes Alcohol abuse in his other; Anuerysm in his brother; Cancer in his father, mother, and other; Hypertension in his sister; Stroke in his other. Allergies  Allergen Reactions  . Atorvastatin Other (See Comments)    REACTION: myalgias  . Simvastatin Other (See Comments)    REACTION: myalgias  . Ace Inhibitors Other (See Comments)    unknown  . Penicillins Other (See Comments)    unknown   Current Outpatient Medications on File Prior to Visit  Medication Sig Dispense Refill  . aspirin EC 81 MG tablet Take 1 tablet (81 mg total) by mouth daily. 90 tablet 11  . cholestyramine light (PREVALITE) 4 g packet Take 1 packet (4 g total) by mouth 2 (two) times daily as needed. 180 packet 3  . Diclofenac Sodium (PENNSAID) 2 % SOLN Place 1 application onto the skin 2 (two) times daily as needed. 2 g 2   No current facility-administered medications on file prior to visit.    Review of Systems  Constitutional: Negative for other unusual diaphoresis or sweats HENT: Negative for ear discharge or swelling Eyes: Negative for other worsening visual disturbances Respiratory: Negative for stridor or other swelling  Gastrointestinal: Negative for worsening distension or other blood Genitourinary: Negative for retention or other urinary change Musculoskeletal: Negative for other MSK pain or swelling Skin: Negative for color change or other new lesions Neurological: Negative for worsening tremors and other numbness  Psychiatric/Behavioral: Negative for worsening other fatigue All other system neg per pt    Objective:   Physical Exam BP (!) 146/104   Pulse 63   Temp  97.8 F (36.6 C) (Oral)   Ht 5' 6.25" (1.683 m)   Wt 136 lb (61.7 kg)   SpO2 97%   BMI 21.79 kg/m  VS noted,  Constitutional: Pt appears in NAD HENT: Head: NCAT.  Right Ear: External ear normal.  Left Ear: External ear normal.  Eyes: . Pupils are equal, round, and reactive to light. Conjunctivae and EOM are  normal Nose: without d/c or deformity Neck: Neck supple. Gross normal ROM Cardiovascular: Normal rate and regular rhythm.   Pulmonary/Chest: Effort normal and breath sounds without rales or wheezing.  Abd:  Soft, NT, ND, + BS, no organomegaly Neurological: Pt is alert. At baseline orientation, motor grossly intact Skin: Skin is warm. No rashes, other new lesions, no LE edema Psychiatric: Pt behavior is normal without agitation  No other exam findings    Assessment & Plan:

## 2018-01-04 NOTE — Assessment & Plan Note (Signed)
[place] for [employer] claritin

## 2018-01-04 NOTE — Patient Instructions (Signed)
Please take all new medication as prescribed - the claritin for allergies, and zetia for cholesterol, and seroquel for behavior, and Amlodipine 5 mg for blood pressure  Please continue all other medications as before, including restarting the aricept  Please have the pharmacy call with any other refills you may need.  Please continue your efforts at being more active, low cholesterol diet, and weight control.  Please keep your appointments with your specialists as you may have planned

## 2018-01-06 ENCOUNTER — Telehealth: Payer: Self-pay

## 2018-01-06 NOTE — Telephone Encounter (Signed)
Quetiapine fumarate 25mg  tab (seroquel) approved until 01/06/2020

## 2018-01-07 ENCOUNTER — Telehealth: Payer: Self-pay | Admitting: Internal Medicine

## 2018-01-07 MED ORDER — FEXOFENADINE HCL 180 MG PO TABS: 180.0000 mg | ORAL_TABLET | Freq: Every day | ORAL | 3 refills | Status: DC

## 2018-01-07 NOTE — Telephone Encounter (Signed)
claritin changed to allegra due to insurance

## 2018-01-28 ENCOUNTER — Telehealth: Payer: Self-pay | Admitting: Internal Medicine

## 2018-01-28 MED ORDER — QUETIAPINE FUMARATE 25 MG PO TABS: ORAL_TABLET | ORAL | 5 refills | Status: DC

## 2018-01-28 NOTE — Telephone Encounter (Signed)
Ok to change the seroquel from 25 mg bid tto  seroquel 25 mg - 1 in the AM and 3 in the PM ( I will send new rx)

## 2018-01-28 NOTE — Telephone Encounter (Signed)
Copied from [place] [id number]. Topic: Quick Communication - See Telephone Encounter >> Jan 28, 2018 11:38 AM Cleaster Corin, NT wrote: CRM for notification. See Telephone encounter for: 01/28/18.  PT. Daughter Filbert Berthold Calling to see if Dr. Jenny Reichmann can call in something for pt. To help him sleep at night. Pt. Is having a horrible time with trying to go to sleep.Brayton Layman can be reached at [place], [address] [place] [address] Phone: [phone] Fax: [phone]

## 2018-01-28 NOTE — Telephone Encounter (Signed)
Spoke to daughter, informed her of the news directions and new rx been sent in. I told her to use the seroquel that he has remaining since it is the same mg and to cut back on waste. She expressed understanding.

## 2018-02-01 ENCOUNTER — Telehealth: Payer: Self-pay | Admitting: *Deleted

## 2018-02-01 ENCOUNTER — Ambulatory Visit: Payer: Medicare HMO | Admitting: *Deleted

## 2018-02-01 VITALS — BP 126/90

## 2018-02-01 DIAGNOSIS — I10 Essential (primary) hypertension: Secondary | ICD-10-CM

## 2018-02-01 MED ORDER — MIRTAZAPINE 30 MG PO TABS: 30.0000 mg | ORAL_TABLET | Freq: Every day | ORAL | 3 refills | Status: DC

## 2018-02-01 NOTE — Telephone Encounter (Signed)
Patient's daughter,Monica came into office with patient for his nurse visit/BP check. She states the increase on the Seroquel 75 mg at bedtime has not helped. She states patient is still staying awake all night every night.   She wants to know what Dr. Jenny Reichmann suggests. Please advise.

## 2018-02-01 NOTE — Progress Notes (Signed)
Patient here with daughter for BP check. See vitals. Advised pt to follow up with PCP as scheduled.

## 2018-02-01 NOTE — Telephone Encounter (Signed)
Ok for new mirtazipine 30 mg qhs - done erx  Ok to cont all other tx as is

## 2018-02-02 NOTE — Telephone Encounter (Signed)
Just to clarify- ok to take mirtazapine 30 mg with Seroquel 75 mg together qhs?

## 2018-02-02 NOTE — Telephone Encounter (Signed)
Yes, that would be ok 

## 2018-02-18 ENCOUNTER — Telehealth: Payer: Self-pay

## 2018-02-18 ENCOUNTER — Encounter: Payer: Self-pay | Admitting: Internal Medicine

## 2018-02-18 NOTE — Telephone Encounter (Signed)
Done hardcopy to [place]  

## 2018-02-18 NOTE — Telephone Encounter (Signed)
Paper were received and placed on PCP's desk  Copied from [place] [id number]. Topic: General - Other >> Feb 16, 2018  4:31 PM Gardiner Ramus wrote: Reason for CRM: pt daughter called and stated that she is going to fax over [place] paper work. Please advise   >> Feb 16, 2018  4:36 PM [place], Isidoro Donning wrote: Juluis Rainier. >> Feb 17, 2018  5:45 PM Valla Leaver wrote: Please call [name] back to confirm whether fax was received.

## 2018-03-01 ENCOUNTER — Other Ambulatory Visit: Payer: Self-pay

## 2018-03-01 ENCOUNTER — Ambulatory Visit (HOSPITAL_COMMUNITY)
Admission: EM | Admit: 2018-03-01 | Discharge: 2018-03-01 | Disposition: A | Discharge status: Home / Self Care | Payer: Medicare HMO | Attending: Family Medicine | Admitting: Family Medicine

## 2018-03-01 ENCOUNTER — Encounter (HOSPITAL_COMMUNITY): Payer: Self-pay | Admitting: *Deleted

## 2018-03-01 DIAGNOSIS — H5711 Ocular pain, right eye: Secondary | ICD-10-CM

## 2018-03-01 DIAGNOSIS — R220 Localized swelling, mass and lump, head: Secondary | ICD-10-CM

## 2018-03-01 DIAGNOSIS — W108XXA Fall (on) (from) other stairs and steps, initial encounter: Secondary | ICD-10-CM | POA: Diagnosis not present

## 2018-03-01 DIAGNOSIS — H02843 Edema of right eye, unspecified eyelid: Secondary | ICD-10-CM | POA: Diagnosis not present

## 2018-03-01 DIAGNOSIS — H1131 Conjunctival hemorrhage, right eye: Secondary | ICD-10-CM

## 2018-03-01 DIAGNOSIS — W19XXXA Unspecified fall, initial encounter: Secondary | ICD-10-CM

## 2018-03-01 MED ORDER — TETRACAINE HCL 0.5 % OP SOLN
OPHTHALMIC | Status: AC
  Filled 2018-03-01: qty 4

## 2018-03-01 NOTE — ED Triage Notes (Signed)
States he fell  On Friday thinks he hit his right eye on the step.

## 2018-03-01 NOTE — ED Provider Notes (Signed)
[facility]    CSN: [id number] Arrival date & time: 03/01/18  [place]     History   Chief Complaint Chief Complaint  Patient presents with  . Fall  . Eye Pain    HPI Matthew Gray is a 84 y.o. male.   Kendry presents with his daughter with complaints of swelling to right eye after a fall three days ago. Tripped and missed a step and fell forward, face struck the step. Had significant swelling at that time, ice has been applied intermittently and swelling has improved. Daughter states still with some swelling so wanted eye to be evaluated further. Does wear glasses. No headache, no dizziness, no change in mentation. No nausea, vomiting. Denies eye pain. Denies change in vision.     ROS per HPI.      Past Medical History:  Diagnosis Date  . ALLERGIC RHINITIS 10/27/2007  . ANKLE PAIN, RIGHT 11/30/2009  . ASTHMA 10/27/2007  . BACK PAIN 10/08/2009  . BENIGN PROSTATIC HYPERTROPHY 10/27/2007  . CHRONIC OBSTRUCTIVE PULMONARY DISEASE, ACUTE EXACERBATION 11/22/2007  . COLONIC POLYPS, HX OF 10/27/2007  . COPD 10/27/2007  . CORONARY ARTERY DISEASE 10/27/2007  . Dementia 01/13/2013  . DEPRESSION 11/30/2009  . FATIGUE 11/30/2009  . Hemoptysis 04/24/2009  . HYPERLIPIDEMIA 04/03/2007  . HYPERTENSION 04/03/2007  . MYALGIA 02/04/2008  . Obstructive chronic bronchitis without exacerbation ([place])   . Pain in Soft Tissues of Limb 02/04/2008  . PARESTHESIA 09/02/2010  . PERIPHERAL EDEMA 11/16/2009  . Peripheral neuropathy 03/08/2012  . PERIPHERAL VASCULAR DISEASE 10/27/2007  . 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 RASH-NONVESICULAR 10/27/2007  . SCIATICA, LEFT 11/08/2009  . SHOULDER PAIN, LEFT 06/21/2009  . SINUSITIS- ACUTE-NOS 10/08/2009  . VITAMIN D DEFICIENCY 06/04/2010    Patient Active Problem List   Diagnosis Date Noted  . Fatigue 10/22/2017  . Hypokalemia 07/29/2017  . Driving safety issue 04/21/2017  . Acute right ankle pain 08/16/2016  . Bilateral hand pain 03/30/2016  . Bilateral hip pain  03/27/2016  . Loose stools 03/27/2016  . Low back pain 11/13/2015  . Cough 02/06/2015  . Weakness 07/19/2014  . Right inguinal hernia 03/04/2013  . Dementia, Alzheimer's, with behavior disturbance 01/13/2013  . Obstructive chronic bronchitis without exacerbation ([place])   . Polyneuropathy in other diseases classified elsewhere ([place])   . PVD (peripheral vascular disease) ([place]) 05/04/2012  . Peripheral neuropathy 03/08/2012  . Bilateral leg pain 03/08/2012  . Peripheral edema 09/10/2011  . Preventative health care 11/22/2010  . Weight loss 11/22/2010  . Anxiety 11/22/2010  . PARESTHESIA 09/02/2010  . Vitamin D deficiency 06/04/2010  . DEPRESSION 11/30/2009  . ANKLE PAIN, RIGHT 11/30/2009  . FATIGUE 11/30/2009  . PERIPHERAL EDEMA 11/16/2009  . SCIATICA, LEFT 11/08/2009  . Backache 10/08/2009  . SHOULDER PAIN, LEFT 06/21/2009  . Hemoptysis 04/24/2009  . 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 MYALGIA 02/04/2008  . Pain in Soft Tissues of Limb 02/04/2008  . CORONARY ARTERY DISEASE 10/27/2007  . PERIPHERAL VASCULAR DISEASE 10/27/2007  . Allergic rhinitis 10/27/2007  . ASTHMA 10/27/2007  . COPD (chronic obstructive pulmonary disease) ([place]) 10/27/2007  . BENIGN PROSTATIC HYPERTROPHY 10/27/2007  . RASH-NONVESICULAR 10/27/2007  . COLONIC POLYPS, HX OF 10/27/2007  . Hyperlipidemia 04/03/2007  . Essential hypertension 04/03/2007    Past Surgical History:  Procedure Laterality Date  . None         Home Medications    Prior to Admission medications   Medication Sig Start Date End Date Taking? Authorizing Provider  amLODipine (NORVASC) 5  MG tablet Take 1 tablet (5 mg total) by mouth daily. 01/04/18   Biagio Borg, MD  aspirin EC 81 MG tablet Take 1 tablet (81 mg total) by mouth daily. 10/22/17   Biagio Borg, MD  cholestyramine light (PREVALITE) 4 g packet Take 1 packet (4 g total) by mouth 2 (two) times daily as needed. 03/27/16   Biagio Borg, MD  Diclofenac Sodium  (PENNSAID) 2 % SOLN Place 1 application onto the skin 2 (two) times daily as needed. 07/16/16   Nche, Charlene Brooke, NP  donepezil (ARICEPT) 10 MG tablet Take 1 tablet (10 mg total) by mouth daily at 2 am. 01/04/18   Biagio Borg, MD  ezetimibe (ZETIA) 10 MG tablet Take 1 tablet (10 mg total) by mouth daily. 01/04/18   Biagio Borg, MD  fexofenadine (ALLEGRA) 180 MG tablet Take 1 tablet (180 mg total) by mouth daily. 01/07/18 01/07/19  Biagio Borg, MD  mirtazapine (REMERON) 30 MG tablet Take 1 tablet (30 mg total) by mouth at bedtime. 02/01/18   Biagio Borg, MD  QUEtiapine (SEROQUEL) 25 MG tablet 1 tab by mouth in the AM, and 3 tab in the PM 01/28/18   Biagio Borg, MD    Family History Family History  Problem Relation Age of Onset  . Cancer Mother        BREAST  . Cancer Father        LUNG  . Hypertension Sister        3 sisters  . Anuerysm Brother        died with  . Stroke Other   . Cancer Other        lung and breast  . Alcohol abuse Other     Social History Social History   Tobacco Use  . Smoking status: Never Smoker  . Smokeless tobacco: Never Used  . Tobacco comment: Quit ten years ago.  Substance Use Topics  . Alcohol use: No  . Drug use: No     Allergies   Atorvastatin; Simvastatin; Ace inhibitors; and Penicillins   Review of Systems Review of Systems   Physical Exam Triage Vital Signs ED Triage Vitals  Enc Vitals Group     BP 03/01/18 1626 (!) 175/106     Pulse Rate 03/01/18 1626 73     Resp 03/01/18 1626 18     Temp 03/01/18 1626 98 F (36.7 C)     Temp src --      SpO2 03/01/18 1626 99 %     Weight --      Height --      Head Circumference --      Peak Flow --      Pain Score 03/01/18 1627 2     Pain Loc --      Pain Edu? --      Excl. in [place]? --    No data found.  Updated Vital Signs BP (!) 175/106 (BP Location: Left Arm)   Pulse 73   Temp 98 F (36.7 C)   Resp 18   SpO2 99%   Visual Acuity Right Eye Distance: 20/20 with  corrective lens Left Eye Distance: 20/20 with corrective lens Bilateral Distance: 20/20 with corrective lens  Right Eye Near:   Left Eye Near:    Bilateral Near:     Physical Exam  Constitutional: He is oriented to person, place, and time. He appears well-developed and well-nourished.  HENT:  Head: Normocephalic and  atraumatic. Head is without raccoon's eyes, without abrasion and without contusion.  Non tender to eye brow, nasal bone, lateral, medial, superior or inferior bones surrounding eye   Eyes: Pupils are equal, round, and reactive to light. EOM are normal. Right eye exhibits no discharge. Left eye exhibits no discharge. Right conjunctiva has a hemorrhage.    Small conjunctival hemorrhage noted to right eye; no hyphema noted; pressure to right eye 17 on tono pen; no injection or otherwise without redness; mild soft tissue swelling   Cardiovascular: Normal rate and regular rhythm.  Pulmonary/Chest: Effort normal and breath sounds normal.  Neurological: He is alert and oriented to person, place, and time.  Skin: Skin is warm and dry.     UC Treatments / Results  Labs (all labs ordered are listed, but only abnormal results are displayed) Labs Reviewed - No data to display  EKG None  Radiology No results found.  Procedures Procedures (including critical care time)  Medications Ordered in UC Medications - No data to display  Initial Impression / Assessment and Plan / UC Course  I have reviewed the triage vital signs and the nursing notes.  Pertinent labs & imaging results that were available during my care of the patient were reviewed by me and considered in my medical decision making (see chart for details).     Visual acuity WNL. Right eye pressure WNL at 17. No vision changes, no pain. Mild soft tissue swelling s/p fall. Continue with ice application. Encouraged follow up with eye doctor for recheck. Return precautions provided. Patient and daughter verbalized  understanding and agreeable to plan.   Final Clinical Impressions(s) / UC Diagnoses   Final diagnoses:  Fall, initial encounter  Facial swelling     Discharge Instructions     Continue with ice application for swelling. Please follow up with your eye doctor in the next 2-3 days for recheck and evaluation. If develop nausea, vomiting, increased confusion, eye pain, drainage or vision changes please go to Er.    ED Prescriptions    None     Controlled Substance Prescriptions [place] Controlled Substance Registry consulted? Not Applicable   Zigmund Gottron, NP 03/01/18 1740

## 2018-03-01 NOTE — Discharge Instructions (Signed)
Continue with ice application for swelling. Please follow up with your eye doctor in the next 2-3 days for recheck and evaluation. If develop nausea, vomiting, increased confusion, eye pain, drainage or vision changes please go to Er.

## 2018-03-22 ENCOUNTER — Ambulatory Visit (HOSPITAL_COMMUNITY)
Admission: EM | Admit: 2018-03-22 | Discharge: 2018-03-22 | Disposition: A | Discharge status: Home / Self Care | Payer: Medicare HMO | Attending: Family Medicine | Admitting: Family Medicine

## 2018-03-22 ENCOUNTER — Ambulatory Visit (INDEPENDENT_AMBULATORY_CARE_PROVIDER_SITE_OTHER): Payer: Medicare HMO

## 2018-03-22 ENCOUNTER — Other Ambulatory Visit: Payer: Self-pay

## 2018-03-22 ENCOUNTER — Encounter (HOSPITAL_COMMUNITY): Payer: Self-pay | Admitting: Emergency Medicine

## 2018-03-22 DIAGNOSIS — M7989 Other specified soft tissue disorders: Secondary | ICD-10-CM | POA: Diagnosis not present

## 2018-03-22 DIAGNOSIS — S80211A Abrasion, right knee, initial encounter: Secondary | ICD-10-CM

## 2018-03-22 DIAGNOSIS — S80212A Abrasion, left knee, initial encounter: Secondary | ICD-10-CM

## 2018-03-22 DIAGNOSIS — M25562 Pain in left knee: Secondary | ICD-10-CM | POA: Diagnosis not present

## 2018-03-22 DIAGNOSIS — W19XXXA Unspecified fall, initial encounter: Secondary | ICD-10-CM | POA: Diagnosis not present

## 2018-03-22 DIAGNOSIS — M25561 Pain in right knee: Secondary | ICD-10-CM | POA: Diagnosis not present

## 2018-03-22 DIAGNOSIS — S8992XA Unspecified injury of left lower leg, initial encounter: Secondary | ICD-10-CM | POA: Diagnosis not present

## 2018-03-22 DIAGNOSIS — F0390 Unspecified dementia without behavioral disturbance: Secondary | ICD-10-CM | POA: Diagnosis not present

## 2018-03-22 DIAGNOSIS — S8991XA Unspecified injury of right lower leg, initial encounter: Secondary | ICD-10-CM | POA: Diagnosis not present

## 2018-03-22 DIAGNOSIS — R2241 Localized swelling, mass and lump, right lower limb: Secondary | ICD-10-CM

## 2018-03-22 NOTE — ED Triage Notes (Signed)
Pt was lost in the woods for about 7 hours on Saturday and fell several times.  Pt now complains of bilateral knee pain when he stands up and sits down.

## 2018-03-22 NOTE — Discharge Instructions (Addendum)
It was nice meeting you!!  Your x rays were negative.  Continue rest, ice, elevation. Tylenol for pain.  Follow-up as needed

## 2018-03-22 NOTE — ED Provider Notes (Signed)
[facility]    CSN: [id number] Arrival date & time: 03/22/18  1527     History   Chief Complaint Chief Complaint  Patient presents with  . Knee Pain    bilateral    HPI Matthew Gray is a 84 y.o. male.   Patient is a 84 year old male with past medical history of depression, dementia that presents with fall.  According to daughter he was in the woods Saturday morning and was lost and they did not find him to about 4 that afternoon.  While he was in the woods he had multiple falls mainly injuring both knees.  He has been able to ambulate but with pain.  He has pain more in the right knee but abrasions and swelling to bilateral knees.      Past Medical History:  Diagnosis Date  . ALLERGIC RHINITIS 10/27/2007  . ANKLE PAIN, RIGHT 11/30/2009  . ASTHMA 10/27/2007  . BACK PAIN 10/08/2009  . BENIGN PROSTATIC HYPERTROPHY 10/27/2007  . CHRONIC OBSTRUCTIVE PULMONARY DISEASE, ACUTE EXACERBATION 11/22/2007  . COLONIC POLYPS, HX OF 10/27/2007  . COPD 10/27/2007  . CORONARY ARTERY DISEASE 10/27/2007  . Dementia 01/13/2013  . DEPRESSION 11/30/2009  . FATIGUE 11/30/2009  . Hemoptysis 04/24/2009  . HYPERLIPIDEMIA 04/03/2007  . HYPERTENSION 04/03/2007  . MYALGIA 02/04/2008  . Obstructive chronic bronchitis without exacerbation ([place])   . Pain in Soft Tissues of Limb 02/04/2008  . PARESTHESIA 09/02/2010  . PERIPHERAL EDEMA 11/16/2009  . Peripheral neuropathy 03/08/2012  . PERIPHERAL VASCULAR DISEASE 10/27/2007  . 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 RASH-NONVESICULAR 10/27/2007  . SCIATICA, LEFT 11/08/2009  . SHOULDER PAIN, LEFT 06/21/2009  . SINUSITIS- ACUTE-NOS 10/08/2009  . VITAMIN D DEFICIENCY 06/04/2010    Patient Active Problem List   Diagnosis Date Noted  . Fatigue 10/22/2017  . Hypokalemia 07/29/2017  . Driving safety issue 04/21/2017  . Acute right ankle pain 08/16/2016  . Bilateral hand pain 03/30/2016  . Bilateral hip pain 03/27/2016  . Loose stools 03/27/2016  . Low back pain  11/13/2015  . Cough 02/06/2015  . Weakness 07/19/2014  . Right inguinal hernia 03/04/2013  . Dementia, Alzheimer's, with behavior disturbance 01/13/2013  . Obstructive chronic bronchitis without exacerbation ([place])   . Polyneuropathy in other diseases classified elsewhere ([place])   . PVD (peripheral vascular disease) ([place]) 05/04/2012  . Peripheral neuropathy 03/08/2012  . Bilateral leg pain 03/08/2012  . Peripheral edema 09/10/2011  . Preventative health care 11/22/2010  . Weight loss 11/22/2010  . Anxiety 11/22/2010  . PARESTHESIA 09/02/2010  . Vitamin D deficiency 06/04/2010  . DEPRESSION 11/30/2009  . ANKLE PAIN, RIGHT 11/30/2009  . FATIGUE 11/30/2009  . PERIPHERAL EDEMA 11/16/2009  . SCIATICA, LEFT 11/08/2009  . Backache 10/08/2009  . SHOULDER PAIN, LEFT 06/21/2009  . Hemoptysis 04/24/2009  . 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 MYALGIA 02/04/2008  . Pain in Soft Tissues of Limb 02/04/2008  . CORONARY ARTERY DISEASE 10/27/2007  . PERIPHERAL VASCULAR DISEASE 10/27/2007  . Allergic rhinitis 10/27/2007  . ASTHMA 10/27/2007  . COPD (chronic obstructive pulmonary disease) ([place]) 10/27/2007  . BENIGN PROSTATIC HYPERTROPHY 10/27/2007  . RASH-NONVESICULAR 10/27/2007  . COLONIC POLYPS, HX OF 10/27/2007  . Hyperlipidemia 04/03/2007  . Essential hypertension 04/03/2007    Past Surgical History:  Procedure Laterality Date  . None         Home Medications    Prior to Admission medications   Medication Sig Start Date End Date Taking? Authorizing Provider  amLODipine (NORVASC) 5 MG tablet Take  1 tablet (5 mg total) by mouth daily. 01/04/18  Yes Biagio Borg, MD  aspirin EC 81 MG tablet Take 1 tablet (81 mg total) by mouth daily. 10/22/17  Yes Biagio Borg, MD  cholestyramine light (PREVALITE) 4 g packet Take 1 packet (4 g total) by mouth 2 (two) times daily as needed. 03/27/16  Yes Biagio Borg, MD  Diclofenac Sodium (PENNSAID) 2 % SOLN Place 1 application onto the skin 2  (two) times daily as needed. 07/16/16  Yes Nche, Charlene Brooke, NP  donepezil (ARICEPT) 10 MG tablet Take 1 tablet (10 mg total) by mouth daily at 2 am. 01/04/18  Yes Biagio Borg, MD  ezetimibe (ZETIA) 10 MG tablet Take 1 tablet (10 mg total) by mouth daily. 01/04/18  Yes Biagio Borg, MD  fexofenadine (ALLEGRA) 180 MG tablet Take 1 tablet (180 mg total) by mouth daily. 01/07/18 01/07/19 Yes Biagio Borg, MD  mirtazapine (REMERON) 30 MG tablet Take 1 tablet (30 mg total) by mouth at bedtime. 02/01/18  Yes Biagio Borg, MD  QUEtiapine (SEROQUEL) 25 MG tablet 1 tab by mouth in the AM, and 3 tab in the PM 01/28/18  Yes Biagio Borg, MD    Family History Family History  Problem Relation Age of Onset  . Cancer Mother        BREAST  . Cancer Father        LUNG  . Hypertension Sister        3 sisters  . Anuerysm Brother        died with  . Stroke Other   . Cancer Other        lung and breast  . Alcohol abuse Other     Social History Social History   Tobacco Use  . Smoking status: Never Smoker  . Smokeless tobacco: Never Used  . Tobacco comment: Quit ten years ago.  Substance Use Topics  . Alcohol use: No  . Drug use: No     Allergies   Atorvastatin; Simvastatin; Ace inhibitors; and Penicillins   Review of Systems Review of Systems  Constitutional: Negative for fatigue, fever and unexpected weight change.  Musculoskeletal: Positive for joint swelling.  Skin: Positive for wound.  Neurological: Positive for weakness. Negative for numbness.     Physical Exam Triage Vital Signs ED Triage Vitals [03/22/18 1553]  Enc Vitals Group     BP (!) 157/96     Pulse Rate 83     Resp      Temp 98.4 F (36.9 C)     Temp Source Oral     SpO2 99 %     Weight      Height      Head Circumference      Peak Flow      Pain Score      Pain Loc      Pain Edu?      Excl. in [place]?    No data found.  Updated Vital Signs BP (!) 157/96 (BP Location: Left Arm)   Pulse 83   Temp 98.4  F (36.9 C) (Oral)   SpO2 99%   Visual Acuity Right Eye Distance:   Left Eye Distance:   Bilateral Distance:    Right Eye Near:   Left Eye Near:    Bilateral Near:     Physical Exam  Constitutional: He appears well-developed and well-nourished.  HENT:  Head: Normocephalic and atraumatic.  Nose: Nose normal.  Neck:  Normal range of motion. Neck supple.  Cardiovascular: Normal rate and regular rhythm.  Pulmonary/Chest: Effort normal and breath sounds normal.  Musculoskeletal:  Bilateral knee swelling with tenderness to palpation. More tender in the right knee. Multiple abrasions to both knees. Able to stand during exam. Able to flex and extend both legs lying flat.   Neurological: He is alert.  Skin: Skin is warm and dry. Capillary refill takes less than 2 seconds.  Psychiatric: He has a normal mood and affect.  Nursing note and vitals reviewed.    UC Treatments / Results  Labs (all labs ordered are listed, but only abnormal results are displayed) Labs Reviewed - No data to display  EKG None  Radiology Dg Knee Complete 4 Views Left  Result Date: 03/22/2018 CLINICAL DATA:  Fall with knee pain EXAM: LEFT KNEE - COMPLETE 4+ VIEW COMPARISON:  None. FINDINGS: No fracture or malalignment. Mild patellofemoral and degenerative changes. No significant knee effusion. Vascular calcifications. IMPRESSION: No acute osseous abnormality. Electronically Signed   By: Donavan Foil M.D.   On: 03/22/2018 17:36   Dg Knee Complete 4 Views Right  Result Date: 03/22/2018 CLINICAL DATA:  Fall with bilateral knee pain EXAM: RIGHT KNEE - COMPLETE 4+ VIEW COMPARISON:  None. FINDINGS: Anterior soft tissue swelling without opaque foreign body. No evidence of fracture, dislocation, or joint effusion. Osteopenia and atherosclerosis. IMPRESSION: Soft tissue swelling without acute osseous finding. Electronically Signed   By: Monte Fantasia M.D.   On: 03/22/2018 17:28    Procedures Procedures (including  critical care time)  Medications Ordered in UC Medications - No data to display  Initial Impression / Assessment and Plan / UC Course  I have reviewed the triage vital signs and the nursing notes.  Pertinent labs & imaging results that were available during my care of the patient were reviewed by me and considered in my medical decision making (see chart for details).     X-rays negative for fractures.  Some soft tissue swelling to the right knee.  We will have him rest, ice and elevate.tylenol or ibuprofen for pain.  Return for worsening symptoms.    Final Clinical Impressions(s) / UC Diagnoses   Final diagnoses:  Fall, initial encounter     Discharge Instructions     It was nice meeting you!!  Your x rays were negative.  Continue rest, ice, elevation. Tylenol for pain.  Follow-up as needed    ED Prescriptions    None     Controlled Substance Prescriptions [place] Controlled Substance Registry consulted? Not Applicable   Orvan July, NP 03/22/18 1749

## 2018-03-30 ENCOUNTER — Telehealth: Payer: Self-pay | Admitting: Internal Medicine

## 2018-03-30 NOTE — Telephone Encounter (Signed)
Patient's daughter [name]) came by the office, requesting to pick up a letter about her father being incapacitated.   Being unsure of if we could give this letter, Due to what kind of POA is on file. I requested my manager Almyra Free) to look into this.   We are about to give the letter - The POA states:  Custodial Trusts: The power to direct the administration or distribution of or to terminate any custodial trust established for my benefit under a uniform custodial trust act, and the power to determine whether I am incapacitated or whether my incapacity has ceased for the purposes of any such custodial trust.  LVM to inform [name] would pick up the letter.

## 2018-04-14 ENCOUNTER — Encounter: Payer: Self-pay | Admitting: Internal Medicine

## 2018-04-14 ENCOUNTER — Telehealth: Payer: Self-pay

## 2018-04-14 NOTE — Telephone Encounter (Signed)
Done hardcopy to shirron 

## 2018-04-14 NOTE — Telephone Encounter (Signed)
Matthew Gray informed letter is ready for pick up.   Letter at front desk.

## 2018-04-14 NOTE — Telephone Encounter (Signed)
Copied from [facility] [id number]. 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 Patient is having trouble making judgement on certain things, trying to gain POA.

## 2018-04-21 ENCOUNTER — Ambulatory Visit: Payer: Medicare HMO | Admitting: Internal Medicine

## 2018-05-17 DIAGNOSIS — H52203 Unspecified astigmatism, bilateral: Secondary | ICD-10-CM | POA: Diagnosis not present

## 2018-05-17 DIAGNOSIS — H524 Presbyopia: Secondary | ICD-10-CM | POA: Diagnosis not present

## 2018-05-17 DIAGNOSIS — Z961 Presence of intraocular lens: Secondary | ICD-10-CM | POA: Diagnosis not present

## 2018-05-24 ENCOUNTER — Ambulatory Visit (INDEPENDENT_AMBULATORY_CARE_PROVIDER_SITE_OTHER): Payer: Medicare HMO | Admitting: Internal Medicine

## 2018-05-24 ENCOUNTER — Ambulatory Visit (INDEPENDENT_AMBULATORY_CARE_PROVIDER_SITE_OTHER)
Admission: RE | Admit: 2018-05-24 | Discharge: 2018-05-24 | Disposition: A | Discharge status: Home / Self Care | Payer: Medicare HMO | Source: Ambulatory Visit | Attending: Internal Medicine | Admitting: Internal Medicine

## 2018-05-24 ENCOUNTER — Encounter: Payer: Self-pay | Admitting: Internal Medicine

## 2018-05-24 VITALS — BP 144/88 | HR 71 | Temp 98.2°F | Ht 66.25 in | Wt 145.0 lb

## 2018-05-24 DIAGNOSIS — R05 Cough: Secondary | ICD-10-CM | POA: Diagnosis not present

## 2018-05-24 DIAGNOSIS — Z111 Encounter for screening for respiratory tuberculosis: Secondary | ICD-10-CM

## 2018-05-24 DIAGNOSIS — E785 Hyperlipidemia, unspecified: Secondary | ICD-10-CM

## 2018-05-24 DIAGNOSIS — F0281 Dementia in other diseases classified elsewhere with behavioral disturbance: Secondary | ICD-10-CM | POA: Diagnosis not present

## 2018-05-24 DIAGNOSIS — I10 Essential (primary) hypertension: Secondary | ICD-10-CM

## 2018-05-24 DIAGNOSIS — J309 Allergic rhinitis, unspecified: Secondary | ICD-10-CM

## 2018-05-24 DIAGNOSIS — G309 Alzheimer's disease, unspecified: Secondary | ICD-10-CM | POA: Diagnosis not present

## 2018-05-24 DIAGNOSIS — R059 Cough, unspecified: Secondary | ICD-10-CM

## 2018-05-24 MED ORDER — DONEPEZIL HCL 10 MG PO TABS: 10.0000 mg | ORAL_TABLET | Freq: Every day | ORAL | 3 refills | Status: DC

## 2018-05-24 MED ORDER — TRIAMCINOLONE ACETONIDE 55 MCG/ACT NA AERO: 2.0000 | INHALATION_SPRAY | Freq: Every day | NASAL | 12 refills | Status: DC

## 2018-05-24 MED ORDER — QUETIAPINE FUMARATE 25 MG PO TABS: ORAL_TABLET | ORAL | 5 refills | Status: DC

## 2018-05-24 MED ORDER — CETIRIZINE HCL 10 MG PO TABS: 10.0000 mg | ORAL_TABLET | Freq: Every day | ORAL | 11 refills | Status: DC

## 2018-05-24 MED ORDER — EZETIMIBE 10 MG PO TABS: 10.0000 mg | ORAL_TABLET | Freq: Every day | ORAL | 3 refills | Status: DC

## 2018-05-24 MED ORDER — AMLODIPINE BESYLATE 5 MG PO TABS: 5.0000 mg | ORAL_TABLET | Freq: Every day | ORAL | 3 refills | Status: DC

## 2018-05-24 NOTE — Assessment & Plan Note (Signed)
Uncontrolled, ok to start the amlodipine as rx,  to f/u any worsening symptoms or concerns

## 2018-05-24 NOTE — Assessment & Plan Note (Addendum)
Etiology unclear, for cxr  Note:  Total time for pt hx, exam, review of record with pt in the room, determination of diagnoses and plan for further eval and tx is > 40 min, with over 50% spent in coordination and counseling of patient including the differential dx, tx, further evaluation and other management of cough, dementia with behav disturbance, allergies, HTN, HLD

## 2018-05-24 NOTE — Patient Instructions (Addendum)
Your TB skin test was placed today  Please make a return Nurse Visit in 2-3 days to have this resulted by the nurse in the office  The form will be filled out soon after this  Please take all new medication as prescribed  - the nasacort and zyrtec for allergies, zetia 10 mg for cholesterol, amlodipine 5 mg for high blood pressure  OK to increase the Seroquel to 2 pills in the AM, and 3 pills in the PM  You can also take Delsym OTC for cough, and/or Mucinex (or it's generic off brand) for congestion, and tylenol as needed for pain.  Please continue all other medications as before, and refills have been done if requested.  Please have the pharmacy call with any other refills you may need.  Please continue your efforts at being more active, low cholesterol diet, and weight control.  Please keep your appointments with your specialists as you may have planned  Please go to the XRAY Department in the Basement (go straight as you get off the elevator) for the x-ray testing  You will be contacted by phone if any changes need to be made immediately.  Otherwise, you will receive a letter about your results with an explanation, but please check with MyChart first.  Please remember to sign up for MyChart if you have not done so, as this will be important to you in the future with finding out test results, communicating by private email, and scheduling acute appointments online when needed.'  Please return in 6 months, or sooner if needed

## 2018-05-24 NOTE — Assessment & Plan Note (Signed)
Ok for increased seroqel 25 mg to 2 in the AM, 3 in PM

## 2018-05-24 NOTE — Progress Notes (Signed)
Subjective:    Patient ID: Matthew Gray, male    DOB: September 09, 1933, 84 y.o.   MRN: [id number]  HPI     Here with daughter who has been some confused about his meds and their purpose, so not taking the amlodipine or zetia or claritin or allegra.  He is taking the seroquel; but still with intermittent agitation and anger, but not combative. Daughter is quite supportive but has difficulty understanding his meds.  She is trying to get him into an adult care program but will need PPD prior.  Not taking the remeron, not clear why as well.  He also has persistent cough with chest congestion it seems but no CP, wheezing ,sob or DOE.  Does have significant allergies it seems, but overt reflux to lead to cough.  Feb 2019 cxr neg for acute, daughter requests f/u exam.   Past Medical History:  Diagnosis Date  . ALLERGIC RHINITIS 10/27/2007  . ANKLE PAIN, RIGHT 11/30/2009  . ASTHMA 10/27/2007  . BACK PAIN 10/08/2009  . BENIGN PROSTATIC HYPERTROPHY 10/27/2007  . CHRONIC OBSTRUCTIVE PULMONARY DISEASE, ACUTE EXACERBATION 11/22/2007  . COLONIC POLYPS, HX OF 10/27/2007  . COPD 10/27/2007  . CORONARY ARTERY DISEASE 10/27/2007  . Dementia 01/13/2013  . DEPRESSION 11/30/2009  . FATIGUE 11/30/2009  . Hemoptysis 04/24/2009  . HYPERLIPIDEMIA 04/03/2007  . HYPERTENSION 04/03/2007  . MYALGIA 02/04/2008  . Obstructive chronic bronchitis without exacerbation ([place])   . Pain in Soft Tissues of Limb 02/04/2008  . PARESTHESIA 09/02/2010  . PERIPHERAL EDEMA 11/16/2009  . Peripheral neuropathy 03/08/2012  . PERIPHERAL VASCULAR DISEASE 10/27/2007  . 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 RASH-NONVESICULAR 10/27/2007  . SCIATICA, LEFT 11/08/2009  . SHOULDER PAIN, LEFT 06/21/2009  . SINUSITIS- ACUTE-NOS 10/08/2009  . VITAMIN D DEFICIENCY 06/04/2010   Past Surgical History:  Procedure Laterality Date  . None      reports that he has never smoked. He has never used smokeless tobacco. He reports that he does not drink alcohol or use drugs. family  history includes Alcohol abuse in his other; Anuerysm in his brother; Cancer in his father, mother, and other; Hypertension in his sister; Stroke in his other. Allergies  Allergen Reactions  . Atorvastatin Other (See Comments)    REACTION: myalgias  . Simvastatin Other (See Comments)    REACTION: myalgias  . Ace Inhibitors Other (See Comments)    unknown  . Penicillins Other (See Comments)    unknown   Current Outpatient Medications on File Prior to Visit  Medication Sig Dispense Refill  . amLODipine (NORVASC) 5 MG tablet Take 1 tablet (5 mg total) by mouth daily. 90 tablet 3  . aspirin EC 81 MG tablet Take 1 tablet (81 mg total) by mouth daily. 90 tablet 11  . cholestyramine light (PREVALITE) 4 g packet Take 1 packet (4 g total) by mouth 2 (two) times daily as needed. 180 packet 3  . Diclofenac Sodium (PENNSAID) 2 % SOLN Place 1 application onto the skin 2 (two) times daily as needed. 2 g 2  . donepezil (ARICEPT) 10 MG tablet Take 1 tablet (10 mg total) by mouth daily at 2 am. 90 tablet 3  . ezetimibe (ZETIA) 10 MG tablet Take 1 tablet (10 mg total) by mouth daily. 90 tablet 3  . fexofenadine (ALLEGRA) 180 MG tablet Take 1 tablet (180 mg total) by mouth daily. 90 tablet 3  . mirtazapine (REMERON) 30 MG tablet Take 1 tablet (30 mg total) by mouth at bedtime. 90 tablet  3  . QUEtiapine (SEROQUEL) 25 MG tablet 1 tab by mouth in the AM, and 3 tab in the PM 120 tablet 5   No current facility-administered medications on file prior to visit.    Review of Systems  Constitutional: Negative for other unusual diaphoresis or sweats HENT: Negative for ear discharge or swelling Eyes: Negative for other worsening visual disturbances Respiratory: Negative for stridor or other swelling  Gastrointestinal: Negative for worsening distension or other blood Genitourinary: Negative for retention or other urinary change Musculoskeletal: Negative for other MSK pain or swelling Skin: Negative for color  change or other new lesions Neurological: Negative for worsening tremors and other numbness  Psychiatric/Behavioral: Negative for worsening agitation or other fatigue All other system neg per daughter    Objective:   Physical Exam BP (!) 144/88   Pulse 71   Temp 98.2 F (36.8 C) (Oral)   Ht 5' 6.25" (1.683 m)   Wt 145 lb (65.8 kg)   SpO2 96%   BMI 23.23 kg/m  VS noted,  Constitutional: Pt appears in NAD HENT: Head: NCAT.  Right Ear: External ear normal.  Left Ear: External ear normal.  Eyes: . Pupils are equal, round, and reactive to light. Conjunctivae and EOM are normal Nose: without d/c or deformity Bilat tm's with mild erythema.  Max sinus areas non tender.  Pharynx with mild erythema, no exudate Neck: Neck supple. Gross normal ROM Cardiovascular: Normal rate and regular rhythm.   Pulmonary/Chest: Effort normal and breath sounds without rales or wheezing.  Abd:  Soft, NT, ND, + BS, no organomegaly Neurological: Pt is alert. At baseline orientation, motor grossly intact Skin: Skin is warm. No rashes, other new lesions, no LE edema Psychiatric: Pt behavior is normal without agitation  No other exam findings    Assessment & Plan:

## 2018-05-24 NOTE — Assessment & Plan Note (Signed)
Mild to mod, for zyrtec and add nasacort asd,  to f/u any worsening symptoms or concerns

## 2018-05-24 NOTE — Assessment & Plan Note (Signed)
Ok to continue zetia, though daughter is possibly not willing as she fears willhave side effect like statins

## 2018-05-26 ENCOUNTER — Telehealth: Payer: Self-pay | Admitting: Internal Medicine

## 2018-05-26 ENCOUNTER — Telehealth: Payer: Self-pay | Admitting: Emergency Medicine

## 2018-05-26 LAB — TB SKIN TEST
Induration: 0 mm
TB Skin Test: NEGATIVE

## 2018-05-26 NOTE — Telephone Encounter (Signed)
Noted. I will look out for it tomorrow.

## 2018-05-26 NOTE — Telephone Encounter (Signed)
Pt came in today for TB skin test reading. At appt on 05/24/18 paperwork was given to Dr Jenny Reichmann for completion. Spoke with Dr Jenny Reichmann and paperwork has been misplaced. [place] to have paperwork refaxed to [place]. Nurses leave at 1 pm and will have paperwork refaxed tomorrow.

## 2018-05-26 NOTE — Telephone Encounter (Signed)
Copied from [place] [id number]. Topic: Inquiry >> May 26, 2018  8:58 AM Margot Ables wrote: Reason for CRM: pt daughter called to check status of paperwork for adult day care brought in 05/24/18 that was to be completed and faxed to the day care. She is bringing pt in today to have TB test read and would like update.

## 2018-05-31 NOTE — Telephone Encounter (Signed)
To brittany for help filling out please

## 2018-05-31 NOTE — Telephone Encounter (Signed)
Pts daughter calling stating that [facility] still has not received paperwork and would like to know why. Pts daughter states that she would like to have it refaxed if possible and is requesting a call back.   [phone]

## 2018-05-31 NOTE — Telephone Encounter (Signed)
Informed pt's daughter that PCP was out of the office the end of last week and form was not completed. However I informed her that I would make sure that that it is priority to be done today.

## 2018-05-31 NOTE — Telephone Encounter (Signed)
Forms have been completed & placed in providers box to sign.  °

## 2018-06-01 DIAGNOSIS — Z0279 Encounter for issue of other medical certificate: Secondary | ICD-10-CM

## 2018-06-01 NOTE — Telephone Encounter (Signed)
Called Hilda Blades - She had already left for the day. If she calls back tomorrow... I have re sent the forms 3 times and all the pages have went. And now I am wondering if maybe we did not receive all the forms to start with. If she would like to re fax some blank forms to ATTN: [place] so that I can compare the forms.

## 2018-06-01 NOTE — Telephone Encounter (Signed)
Priscille Kluver, RN with [facility] called and said she did not receive all the forms. She would like all of those to be re-sent again. She needs pages 3/4 and 4/4 in the top corner of the pages. Fax Number [phone].

## 2018-06-01 NOTE — Telephone Encounter (Signed)
Forms have been signed and placed on your desk.

## 2018-06-01 NOTE — Telephone Encounter (Signed)
Forms have been Faxed to Adult day Service @ [phone], Copy sent to scan & Charged for.   LVM for daughter to inform. & Original is ready to be picked up or can mail if she would like.

## 2018-06-09 NOTE — Telephone Encounter (Signed)
Patient daughter is calling to state the Adult day service has not received the paperwork. She is requesting to have it mailed directly to her.  The address is [address]  [place] [address]. she is requesting a call back . She also requesting the form be resent to the day service  Please advise

## 2018-06-10 NOTE — Telephone Encounter (Signed)
Forms have been mailed.

## 2018-06-15 ENCOUNTER — Telehealth: Payer: Self-pay

## 2018-06-15 NOTE — Telephone Encounter (Signed)
Key: [id number]

## 2018-06-17 NOTE — Telephone Encounter (Signed)
[name] daughter called back in stating that she spoke with Hilda Blades with Adult day Service. Hilda Blades is stating that she still haven't received pages 3 and 4 of paperwork and if not received pt will not be able to continue going to location. Daughter says that she do not understand why, what is happening. I did advise that paper work has been re-faxed and also a copy has been mailed out to her. To assist pt/daughter, I re-faxed forms to [place] on office behalf.    Advised daughter to give [place] a call back if further assistance is needed.   Thanks.

## 2018-06-17 NOTE — Telephone Encounter (Signed)
Approved until 08/25/2019 by [facility]

## 2018-06-21 ENCOUNTER — Telehealth: Payer: Self-pay | Admitting: Internal Medicine

## 2018-06-21 NOTE — Telephone Encounter (Signed)
Patient's daughter [place]) has dropped off FMLA to completed. She needs 4 to 8 hours a day 3 to 5 days a month. She has to leave work sometimes to help him with ADL's, meals, transportation to & from doctor visits.   Please advise if okay to complete.  She is aware Dr.John's is out of office until 11/4.

## 2018-06-23 ENCOUNTER — Telehealth: Payer: Self-pay | Admitting: Internal Medicine

## 2018-06-23 NOTE — Telephone Encounter (Signed)
Forms have been completed & placed in providers box to sign.  °

## 2018-06-23 NOTE — Telephone Encounter (Signed)
Ok to complete

## 2018-06-23 NOTE — Telephone Encounter (Signed)
Copied from [place] [id number]. Topic: Quick Communication - See Telephone Encounter >> Jun 23, 2018  3:56 PM Antonieta Iba C wrote: CRM for notification. See Telephone encounter for: 06/23/18.  [name] daughter [name] called in to be advised. She said that pt started taking ezetimibe (ZETIA) 10 MG tablet and since then pt has been experiencing weakness. Daughter says that she was told by provider that medication could cause drowsiness but daughter says that it doesn't really seem like drowsiness. Daughter would like to be advised further on if she should stop giving pt medication?   Please advise  [phone]

## 2018-06-24 NOTE — Telephone Encounter (Signed)
Stop medication and follow-up with Dr. Jenny Reichmann upon his return

## 2018-06-24 NOTE — Telephone Encounter (Signed)
Called pt's daughter, [name], LVM to discontinue medication and to follow up with PCP for an OV next week.

## 2018-06-25 NOTE — Telephone Encounter (Signed)
Patient daughter is calling to request a call to schedule a week appt. The next availability is 11-19. She is requesting a sooner date.  Please advise   [phone]

## 2018-06-29 NOTE — Telephone Encounter (Signed)
Forms have been signed, Faxed to [employer] @ [phone], Copy sent to scan &charged for.   Mailed to [place] as she requested.

## 2018-06-30 NOTE — Telephone Encounter (Signed)
Spoke with daughter and none of the appointments worked for her any sooner. She has set up an appointment for 11/25.

## 2018-06-30 NOTE — Telephone Encounter (Signed)
The zetia normally is well tolerated without fatigue or weakness, but ok to stop for now.

## 2018-06-30 NOTE — Telephone Encounter (Signed)
Noted  

## 2018-06-30 NOTE — Telephone Encounter (Signed)
Pts daughter calling to checking status on getting an earlier appt.

## 2018-07-19 ENCOUNTER — Encounter: Payer: Self-pay | Admitting: Internal Medicine

## 2018-07-19 ENCOUNTER — Ambulatory Visit (INDEPENDENT_AMBULATORY_CARE_PROVIDER_SITE_OTHER): Payer: Medicare HMO | Admitting: Internal Medicine

## 2018-07-19 VITALS — BP 138/90 | HR 83 | Temp 98.3°F | Ht 66.25 in | Wt 153.0 lb

## 2018-07-19 DIAGNOSIS — I739 Peripheral vascular disease, unspecified: Secondary | ICD-10-CM | POA: Diagnosis not present

## 2018-07-19 DIAGNOSIS — E785 Hyperlipidemia, unspecified: Secondary | ICD-10-CM | POA: Diagnosis not present

## 2018-07-19 DIAGNOSIS — R32 Unspecified urinary incontinence: Secondary | ICD-10-CM

## 2018-07-19 NOTE — Progress Notes (Signed)
Subjective:    Patient ID: Matthew Gray, male    DOB: March 27, 1934, 84 y.o.   MRN: [id number]  HPI  Here to f/u with family and hx of dementia; overall doing ok,  Pt denies chest pain, increasing sob or doe, wheezing, orthopnea, PND, increased LE swelling, palpitations, dizziness or syncope, though has known PVD with abnormal vascular study 2013, and current ongoing pain with ambulation below the knees.  Pt denies new neurological symptoms such as new headache, or facial or extremity weakness or numbness.  Pt denies polydipsia, polyuria, or low sugar episode.  Pt states overall good compliance with meds, mostly trying to follow appropriate diet, with wt overall stable.  Could not tolerate zetia due to weakness and worsening balance.  Has some urinary slowing and occas ? Overflow incontinence like wetting accidents.  Does have several wks ongoing nasal allergy symptoms with clearish congestion, itch and sneezing, without fever, pain, ST, cough, swelling or wheezing.   Past Medical History:  Diagnosis Date  . ALLERGIC RHINITIS 10/27/2007  . ANKLE PAIN, RIGHT 11/30/2009  . ASTHMA 10/27/2007  . BACK PAIN 10/08/2009  . BENIGN PROSTATIC HYPERTROPHY 10/27/2007  . CHRONIC OBSTRUCTIVE PULMONARY DISEASE, ACUTE EXACERBATION 11/22/2007  . COLONIC POLYPS, HX OF 10/27/2007  . COPD 10/27/2007  . CORONARY ARTERY DISEASE 10/27/2007  . Dementia ([place]) 01/13/2013  . DEPRESSION 11/30/2009  . FATIGUE 11/30/2009  . Hemoptysis 04/24/2009  . HYPERLIPIDEMIA 04/03/2007  . HYPERTENSION 04/03/2007  . MYALGIA 02/04/2008  . Obstructive chronic bronchitis without exacerbation ([place])   . Pain in Soft Tissues of Limb 02/04/2008  . PARESTHESIA 09/02/2010  . PERIPHERAL EDEMA 11/16/2009  . Peripheral neuropathy 03/08/2012  . PERIPHERAL VASCULAR DISEASE 10/27/2007  . 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 RASH-NONVESICULAR 10/27/2007  . SCIATICA, LEFT 11/08/2009  . SHOULDER PAIN, LEFT 06/21/2009  . SINUSITIS- ACUTE-NOS 10/08/2009  . VITAMIN D DEFICIENCY  06/04/2010   Past Surgical History:  Procedure Laterality Date  . None      reports that he has never smoked. He has never used smokeless tobacco. He reports that he does not drink alcohol or use drugs. family history includes Alcohol abuse in his other; Anuerysm in his brother; Cancer in his father, mother, and other; Hypertension in his sister; Stroke in his other. Allergies  Allergen Reactions  . Atorvastatin Other (See Comments)    REACTION: myalgias  . Simvastatin Other (See Comments)    REACTION: myalgias  . Zetia [Ezetimibe] Other (See Comments)    weakness  . Ace Inhibitors Other (See Comments)    unknown  . Penicillins Other (See Comments)    unknown   Current Outpatient Medications on File Prior to Visit  Medication Sig Dispense Refill  . amLODipine (NORVASC) 5 MG tablet Take 1 tablet (5 mg total) by mouth daily. 90 tablet 3  . aspirin EC 81 MG tablet Take 1 tablet (81 mg total) by mouth daily. 90 tablet 11  . cetirizine (ZYRTEC) 10 MG tablet Take 1 tablet (10 mg total) by mouth daily. 30 tablet 11  . cholestyramine light (PREVALITE) 4 g packet Take 1 packet (4 g total) by mouth 2 (two) times daily as needed. 180 packet 3  . Diclofenac Sodium (PENNSAID) 2 % SOLN Place 1 application onto the skin 2 (two) times daily as needed. 2 g 2  . donepezil (ARICEPT) 10 MG tablet Take 1 tablet (10 mg total) by mouth daily at 2 am. 90 tablet 3  . ezetimibe (ZETIA) 10 MG tablet Take 1 tablet (  10 mg total) by mouth daily. 90 tablet 3  . QUEtiapine (SEROQUEL) 25 MG tablet 2 tab by mouth in the AM, and 3 tab in the PM 150 tablet 5  . triamcinolone (NASACORT) 55 MCG/ACT AERO nasal inhaler Place 2 sprays into the nose daily. 1 Inhaler 12   No current facility-administered medications on file prior to visit.    Review of Systems  Constitutional: Negative for other unusual diaphoresis or sweats HENT: Negative for ear discharge or swelling Eyes: Negative for other worsening visual  disturbances Respiratory: Negative for stridor or other swelling  Gastrointestinal: Negative for worsening distension or other blood Genitourinary: Negative for retention or other urinary change Musculoskeletal: Negative for other MSK pain or swelling Skin: Negative for color change or other new lesions Neurological: Negative for worsening tremors and other numbness  Psychiatric/Behavioral: Negative for worsening agitation or other fatigue All other system neg per pt    Objective:   Physical Exam BP 138/90   Pulse 83   Temp 98.3 F (36.8 C) (Oral)   Ht 5' 6.25" (1.683 m)   Wt 153 lb (69.4 kg)   SpO2 96%   BMI 24.51 kg/m  VS noted, not ill appearing Constitutional: Pt appears in NAD HENT: Head: NCAT.  Right Ear: External ear normal.  Left Ear: External ear normal.  Bilat tm's with mild erythema.  Max sinus areas non tender.  Pharynx with mild erythema, no exudate Eyes: . Pupils are equal, round, and reactive to light. Conjunctivae and EOM are normal Nose: without d/c or deformity Neck: Neck supple. Gross normal ROM Cardiovascular: Normal rate and regular rhythm.   Pulmonary/Chest: Effort normal and breath sounds without rales or wheezing.  Abd:  Soft, NT, ND, + BS, no organomegaly Neurological: Pt is alert. At baseline orientation, motor grossly intact Skin: Skin is warm. No rashes, other new lesions, no LE edema, trace dorsalis pedis bilat Psychiatric: Pt behavior is normal without agitation  No other exam findings Lab Results  Component Value Date   WBC 9.0 10/22/2017   HGB 14.5 10/22/2017   HCT 43.6 10/22/2017   PLT 261.0 10/22/2017   GLUCOSE 65 (L) 10/22/2017   CHOL 240 (H) 10/22/2017   TRIG 59.0 10/22/2017   HDL 71.70 10/22/2017   LDLDIRECT 131.5 06/24/2006   LDLCALC 157 (H) 10/22/2017   ALT 7 10/22/2017   AST 13 10/22/2017   NA 141 10/22/2017   K 4.1 10/22/2017   CL 96 10/22/2017   CREATININE 1.08 10/22/2017   BUN 15 10/22/2017   CO2 30 10/22/2017   TSH  0.69 10/22/2017   PSA 1.07 03/08/2012       Assessment & Plan:

## 2018-07-19 NOTE — Assessment & Plan Note (Signed)
?   Prostate related, for trial flomax asd,  to f/u any worsening symptoms or concerns

## 2018-07-19 NOTE — Assessment & Plan Note (Signed)
Suspect ongoing leg pain is due to this, family and pt decline further evaluation or tx at this time

## 2018-07-19 NOTE — Patient Instructions (Addendum)
Please take all new medication as prescribed - the flomax (but ok to stop after 5 days if does not help)  Please ask the pharmacist to check on how to use the nasal spray  Please continue all other medications as before, and refills have been done if requested.  Please have the pharmacy call with any other refills you may need.  Please continue your efforts at being more active, low cholesterol diet, and weight control.  Please keep your appointments with your specialists as you may have planned  Please return in 3 months, or sooner if needed, with Lab testing done 3-5 days before

## 2018-07-19 NOTE — Assessment & Plan Note (Signed)
Intolerant of statins and zetia, so for low chol diet

## 2018-07-30 ENCOUNTER — Telehealth: Payer: Self-pay | Admitting: Internal Medicine

## 2018-07-30 NOTE — Telephone Encounter (Signed)
Rec'd call from Abigail Butts, Pharmacist at [facility].  Stated the pt. is requesting a refill on Aricept 10 mg. too soon.  In questioning how pt. Is taking it, she was informed by the daughter that the pt. has been taking Aricept 10 mg. 1 tab in AM, and 2 tabs at HS.  The order is Aricept 10 mg., 1 tab daily @ 2 AM.  The Pharmacist called the daughter back to make sure there was no misunderstanding on which medicaton the pt. was taking 3 times/ day.  The daughter reported to the Pharmacist that Dr. Jenny Reichmann told the pt. To take the Aricept three times / day.  Advised that will need to send message to Dr. Jenny Reichmann re: this discrepancy, as no documentation can be found to support taking Aricept TID.  Pharmacist, Abigail Butts verb. Understanding.  Stated the daughter indicated the pt. Has enough Aricept to get through until Monday.

## 2018-08-02 ENCOUNTER — Other Ambulatory Visit: Payer: Self-pay | Admitting: Internal Medicine

## 2018-08-02 MED ORDER — DONEPEZIL HCL 10 MG PO TABS: 10.0000 mg | ORAL_TABLET | Freq: Every day | ORAL | 0 refills | Status: DC

## 2018-08-02 NOTE — Telephone Encounter (Signed)
Agree with aricept 10 mg daily, ok for early refill

## 2018-08-02 NOTE — Telephone Encounter (Signed)
Per PCP pt is only to take 1 Aricept per day.

## 2018-08-02 NOTE — Telephone Encounter (Signed)
Copied from [place] [id number]. Topic: Quick Communication - Rx Refill/Question >> Aug 02, 2018 11:22 AM Sheran Luz wrote: Medication: donepezil (ARICEPT) 10 MG tablet   Patient is requesting a refill of this medication-patient has zero tablets remaining.   Has the patient contacted their pharmacy? Yes, advised to contact office.   Preferred Pharmacy (with phone number or street name): [facility] - [place], [place] [address]. [phone] (Phone) [phone] (Fax)

## 2018-08-02 NOTE — Addendum Note (Signed)
Addended by: Juliet Rude on: 08/02/2018 03:42 PM   Modules accepted: Orders

## 2018-08-02 NOTE — Telephone Encounter (Signed)
I have talked with [facility] agent who has been talking with patient's daughter---per dr Jenny Reichmann, patient should be ok no longer than he has been taking 3 aricepts (incorrectly)---dr Jenny Reichmann is authorizing refill on aricept early to be sent to patients pharmacy now---patients daughter repeated back for understanding, take one pill daily (at same time each day)---if any adjustments need to be made to medications, patient will need to make office visit to discuss with dr john---rx called to pharmacy

## 2018-08-04 MED ORDER — DONEPEZIL HCL 10 MG PO TABS: 10.0000 mg | ORAL_TABLET | Freq: Every day | ORAL | 0 refills | Status: DC

## 2018-10-04 ENCOUNTER — Ambulatory Visit (HOSPITAL_COMMUNITY)
Admission: EM | Admit: 2018-10-04 | Discharge: 2018-10-04 | Disposition: A | Discharge status: Home / Self Care | Payer: Medicare HMO | Attending: Family Medicine | Admitting: Family Medicine

## 2018-10-04 ENCOUNTER — Encounter (HOSPITAL_COMMUNITY): Payer: Self-pay | Admitting: Emergency Medicine

## 2018-10-04 ENCOUNTER — Ambulatory Visit (INDEPENDENT_AMBULATORY_CARE_PROVIDER_SITE_OTHER): Payer: Medicare HMO

## 2018-10-04 ENCOUNTER — Other Ambulatory Visit: Payer: Self-pay

## 2018-10-04 DIAGNOSIS — R05 Cough: Secondary | ICD-10-CM

## 2018-10-04 DIAGNOSIS — I10 Essential (primary) hypertension: Secondary | ICD-10-CM | POA: Diagnosis not present

## 2018-10-04 DIAGNOSIS — J111 Influenza due to unidentified influenza virus with other respiratory manifestations: Secondary | ICD-10-CM | POA: Diagnosis present

## 2018-10-04 DIAGNOSIS — R69 Illness, unspecified: Secondary | ICD-10-CM | POA: Diagnosis not present

## 2018-10-04 MED ORDER — CLONIDINE HCL 0.1 MG PO TABS
ORAL_TABLET | ORAL | Status: AC
  Filled 2018-10-04: qty 2

## 2018-10-04 MED ORDER — CLONIDINE HCL 0.1 MG PO TABS
0.1000 mg | ORAL_TABLET | Freq: Once | ORAL | Status: AC
  Administered 2018-10-04: 0.1 mg via ORAL

## 2018-10-04 MED ORDER — OSELTAMIVIR PHOSPHATE 75 MG PO CAPS: 75.0000 mg | ORAL_CAPSULE | Freq: Two times a day (BID) | ORAL | 0 refills | Status: DC

## 2018-10-04 MED ORDER — ACETAMINOPHEN 325 MG PO TABS
ORAL_TABLET | ORAL | Status: AC
  Filled 2018-10-04: qty 2

## 2018-10-04 MED ORDER — ACETAMINOPHEN 325 MG PO TABS
650.0000 mg | ORAL_TABLET | Freq: Once | ORAL | Status: AC
  Administered 2018-10-04: 650 mg via ORAL

## 2018-10-04 NOTE — ED Triage Notes (Signed)
[name] daughter states that he always has a cough, but this morning he woke up with a much worse cough and vomiting.

## 2018-10-04 NOTE — ED Provider Notes (Signed)
[place]    CSN: [id number] Arrival date & time: 10/04/18  1432     History   Chief Complaint Chief Complaint  Patient presents with  . Cough    HPI Matthew Gray is a 85 y.o. male.   Patient has history of cognitive disorder.  He has chronic cough but awoke this morning with more severe deeper cough and fever.  He goes to adult daycare and his daughter who is his caregiver was called to come and pick him up today due to the cough.  Denies any sore throat or myalgias or headache. HPI  Past Medical History:  Diagnosis Date  . ALLERGIC RHINITIS 10/27/2007  . ANKLE PAIN, RIGHT 11/30/2009  . ASTHMA 10/27/2007  . BACK PAIN 10/08/2009  . BENIGN PROSTATIC HYPERTROPHY 10/27/2007  . CHRONIC OBSTRUCTIVE PULMONARY DISEASE, ACUTE EXACERBATION 11/22/2007  . COLONIC POLYPS, HX OF 10/27/2007  . COPD 10/27/2007  . CORONARY ARTERY DISEASE 10/27/2007  . Dementia ([place]) 01/13/2013  . DEPRESSION 11/30/2009  . FATIGUE 11/30/2009  . Hemoptysis 04/24/2009  . HYPERLIPIDEMIA 04/03/2007  . HYPERTENSION 04/03/2007  . MYALGIA 02/04/2008  . Obstructive chronic bronchitis without exacerbation ([place])   . Pain in Soft Tissues of Limb 02/04/2008  . PARESTHESIA 09/02/2010  . PERIPHERAL EDEMA 11/16/2009  . Peripheral neuropathy 03/08/2012  . PERIPHERAL VASCULAR DISEASE 10/27/2007  . 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 RASH-NONVESICULAR 10/27/2007  . SCIATICA, LEFT 11/08/2009  . SHOULDER PAIN, LEFT 06/21/2009  . SINUSITIS- ACUTE-NOS 10/08/2009  . VITAMIN D DEFICIENCY 06/04/2010    Patient Active Problem List   Diagnosis Date Noted  . Urinary incontinence 07/19/2018  . Fatigue 10/22/2017  . Hypokalemia 07/29/2017  . Driving safety issue 04/21/2017  . Acute right ankle pain 08/16/2016  . Bilateral hand pain 03/30/2016  . Bilateral hip pain 03/27/2016  . Loose stools 03/27/2016  . Low back pain 11/13/2015  . Cough 02/06/2015  . Weakness 07/19/2014  . Right inguinal hernia 03/04/2013  . Dementia, Alzheimer's,  with behavior disturbance ([place]) 01/13/2013  . Obstructive chronic bronchitis without exacerbation ([place])   . Polyneuropathy in other diseases classified elsewhere ([place])   . PVD (peripheral vascular disease) ([place]) 05/04/2012  . Peripheral neuropathy 03/08/2012  . Bilateral leg pain 03/08/2012  . Peripheral edema 09/10/2011  . Preventative health care 11/22/2010  . Weight loss 11/22/2010  . Anxiety 11/22/2010  . PARESTHESIA 09/02/2010  . Vitamin D deficiency 06/04/2010  . DEPRESSION 11/30/2009  . ANKLE PAIN, RIGHT 11/30/2009  . FATIGUE 11/30/2009  . PERIPHERAL EDEMA 11/16/2009  . SCIATICA, LEFT 11/08/2009  . Backache 10/08/2009  . SHOULDER PAIN, LEFT 06/21/2009  . Hemoptysis 04/24/2009  . 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 MYALGIA 02/04/2008  . Pain in Soft Tissues of Limb 02/04/2008  . CORONARY ARTERY DISEASE 10/27/2007  . PERIPHERAL VASCULAR DISEASE 10/27/2007  . Allergic rhinitis 10/27/2007  . ASTHMA 10/27/2007  . COPD (chronic obstructive pulmonary disease) ([place]) 10/27/2007  . BENIGN PROSTATIC HYPERTROPHY 10/27/2007  . RASH-NONVESICULAR 10/27/2007  . COLONIC POLYPS, HX OF 10/27/2007  . Hyperlipidemia 04/03/2007  . Essential hypertension 04/03/2007    Past Surgical History:  Procedure Laterality Date  . None         Home Medications    Prior to Admission medications   Medication Sig Start Date End Date Taking? Authorizing Provider  amLODipine (NORVASC) 5 MG tablet Take 1 tablet (5 mg total) by mouth daily. 05/24/18  Yes Biagio Borg, MD  aspirin EC 81 MG tablet Take 1 tablet (81 mg  total) by mouth daily. 10/22/17  Yes Biagio Borg, MD  donepezil (ARICEPT) 10 MG tablet Take 1 tablet (10 mg total) by mouth daily at 2 am. 08/04/18  Yes Biagio Borg, MD  QUEtiapine (SEROQUEL) 25 MG tablet 2 tab by mouth in the AM, and 3 tab in the PM 05/24/18  Yes Biagio Borg, MD  cetirizine (ZYRTEC) 10 MG tablet Take 1 tablet (10 mg total) by mouth daily. 05/24/18 05/24/19   Biagio Borg, MD  cholestyramine light (PREVALITE) 4 g packet Take 1 packet (4 g total) by mouth 2 (two) times daily as needed. 03/27/16   Biagio Borg, MD  Diclofenac Sodium (PENNSAID) 2 % SOLN Place 1 application onto the skin 2 (two) times daily as needed. 07/16/16   Nche, Charlene Brooke, NP  ezetimibe (ZETIA) 10 MG tablet Take 1 tablet (10 mg total) by mouth daily. 05/24/18   Biagio Borg, MD  triamcinolone (NASACORT) 55 MCG/ACT AERO nasal inhaler Place 2 sprays into the nose daily. 05/24/18   Biagio Borg, MD    Family History Family History  Problem Relation Age of Onset  . Cancer Mother        BREAST  . Cancer Father        LUNG  . Hypertension Sister        3 sisters  . Anuerysm Brother        died with  . Stroke Other   . Cancer Other        lung and breast  . Alcohol abuse Other     Social History Social History   Tobacco Use  . Smoking status: Never Smoker  . Smokeless tobacco: Never Used  . Tobacco comment: Quit ten years ago.  Substance Use Topics  . Alcohol use: No  . Drug use: No     Allergies   Atorvastatin; Simvastatin; Zetia [ezetimibe]; Ace inhibitors; and Penicillins   Review of Systems Review of Systems  Constitutional: Positive for fever.  HENT: Positive for congestion.   Respiratory: Positive for cough.   Cardiovascular: Negative.   Gastrointestinal: Negative.   All other systems reviewed and are negative.    Physical Exam Triage Vital Signs ED Triage Vitals  Enc Vitals Group     BP 10/04/18 1558 (!) 180/113     Pulse Rate 10/04/18 1558 97     Resp 10/04/18 1558 (!) 36     Temp 10/04/18 1558 (!) 102.3 F (39.1 C)     Temp Source 10/04/18 1558 Temporal     SpO2 10/04/18 1558 97 %     Weight --      Height --      Head Circumference --      Peak Flow --      Pain Score 10/04/18 1605 0     Pain Loc --      Pain Edu? --      Excl. in [place]? --    No data found.  Updated Vital Signs BP (!) 180/113 (BP Location: Right Arm)    Pulse 97   Temp (!) 102.3 F (39.1 C) (Temporal)   Resp (!) 36   SpO2 97%   Visual Acuity Right Eye Distance:   Left Eye Distance:   Bilateral Distance:    Right Eye Near:   Left Eye Near:    Bilateral Near:     Physical Exam Constitutional:      Appearance: Normal appearance. He is ill-appearing.  HENT:  Right Ear: Tympanic membrane normal.     Left Ear: Tympanic membrane normal.     Mouth/Throat:     Mouth: Mucous membranes are moist.     Pharynx: Oropharynx is clear.  Neck:     Musculoskeletal: Normal range of motion and neck supple.  Cardiovascular:     Rate and Rhythm: Normal rate and regular rhythm.     Heart sounds: Normal heart sounds.  Pulmonary:     Effort: Pulmonary effort is normal.     Breath sounds: Normal breath sounds.  Neurological:     General: No focal deficit present.     Mental Status: He is alert and oriented to person, place, and time.  Psychiatric:     Comments: Patient unable to say date or name of president   Chest x-ray showed no acute process Repeat blood pressure after clonidine was 137/91   UC Treatments / Results  Labs (all labs ordered are listed, but only abnormal results are displayed) Labs Reviewed - No data to display  EKG None  Radiology No results found.  Procedures Procedures (including critical care time)  Medications Ordered in UC Medications  acetaminophen (TYLENOL) tablet 650 mg (has no administration in time range)  cloNIDine (CATAPRES) tablet 0.1 mg (has no administration in time range)    Initial Impression / Assessment and Plan / UC Course  I have reviewed the triage vital signs and the nursing notes.  Pertinent labs & imaging results that were available during my care of the patient were reviewed by me and considered in my medical decision making (see chart for details).     Flulike illness.  Will treat with Tamiflu.  Daughter has cough syrup.  Recommend Tylenol for temp greater than 101 Final  Clinical Impressions(s) / UC Diagnoses   Final diagnoses:  None   Discharge Instructions   None    ED Prescriptions    None     Controlled Substance Prescriptions [place] Controlled Substance Registry consulted? No   Wardell Honour, MD 10/04/18 [phone]

## 2018-10-05 ENCOUNTER — Ambulatory Visit: Payer: Self-pay | Admitting: *Deleted

## 2018-10-05 NOTE — Telephone Encounter (Signed)
[name] daughter has been informed and expressed understanding.  

## 2018-10-05 NOTE — Telephone Encounter (Signed)
Patient's daughter Ann Held would like to know is he suppose to take all his other medications with his oseltamivir (TAMIFLU) 75 MG capsule. Please advise.   Per [employer]- patient is not taking any medications that would interact with Tamiflu. Recommended take with food.  Daughter is concerned about the cough and the X-ray results. Cough has been present for at least 2 weeks with white/yellow phlegm. Patient is using Robitussin DM and has been using for 2 weeks. She wants to know if continued use is appropriate. Daughter states it is helpful- but wants to know if antibiotic would clear things up.  Told daughter I would send her question for review- since patient was just seen at ED yesterday.

## 2018-10-05 NOTE — Telephone Encounter (Signed)
Ok to finish the tramiflu  Some cough can persist for several wks, but would only be concerning if also having worsening fever, ST, sob, CP or other unusual symptoms

## 2018-10-14 ENCOUNTER — Encounter: Payer: Self-pay | Admitting: Internal Medicine

## 2018-10-14 ENCOUNTER — Other Ambulatory Visit (INDEPENDENT_AMBULATORY_CARE_PROVIDER_SITE_OTHER): Payer: Medicare HMO

## 2018-10-14 ENCOUNTER — Ambulatory Visit (INDEPENDENT_AMBULATORY_CARE_PROVIDER_SITE_OTHER): Payer: Medicare HMO | Admitting: Internal Medicine

## 2018-10-14 VITALS — BP 138/84 | HR 87 | Temp 98.0°F | Ht 66.25 in | Wt 157.0 lb

## 2018-10-14 DIAGNOSIS — J441 Chronic obstructive pulmonary disease with (acute) exacerbation: Secondary | ICD-10-CM

## 2018-10-14 DIAGNOSIS — E785 Hyperlipidemia, unspecified: Secondary | ICD-10-CM

## 2018-10-14 DIAGNOSIS — I10 Essential (primary) hypertension: Secondary | ICD-10-CM | POA: Diagnosis not present

## 2018-10-14 LAB — CBC WITH DIFFERENTIAL/PLATELET
Basophils Absolute: 0.1 10*3/uL (ref 0.0–0.1)
Basophils Relative: 0.8 % (ref 0.0–3.0)
EOS PCT: 1.4 % (ref 0.0–5.0)
Eosinophils Absolute: 0.1 10*3/uL (ref 0.0–0.7)
HEMATOCRIT: 44.4 % (ref 39.0–52.0)
Hemoglobin: 14.6 g/dL (ref 13.0–17.0)
Lymphocytes Relative: 9.9 % — ABNORMAL LOW (ref 12.0–46.0)
Lymphs Abs: 0.8 10*3/uL (ref 0.7–4.0)
MCHC: 32.9 g/dL (ref 30.0–36.0)
MCV: 80.8 fl (ref 78.0–100.0)
Monocytes Absolute: 0.8 10*3/uL (ref 0.1–1.0)
Monocytes Relative: 9.2 % (ref 3.0–12.0)
Neutro Abs: 6.5 10*3/uL (ref 1.4–7.7)
Neutrophils Relative %: 78.7 % — ABNORMAL HIGH (ref 43.0–77.0)
Platelets: 221 10*3/uL (ref 150.0–400.0)
RBC: 5.5 Mil/uL (ref 4.22–5.81)
RDW: 15.3 % (ref 11.5–15.5)
WBC: 8.3 10*3/uL (ref 4.0–10.5)

## 2018-10-14 LAB — BASIC METABOLIC PANEL
BUN: 26 mg/dL — ABNORMAL HIGH (ref 6–23)
CO2: 32 mEq/L (ref 19–32)
Calcium: 8.9 mg/dL (ref 8.4–10.5)
Chloride: 102 mEq/L (ref 96–112)
Creatinine, Ser: 1.09 mg/dL (ref 0.40–1.50)
GFR: 78.32 mL/min (ref >60.00)
Glucose, Bld: 76 mg/dL (ref 70–99)
Potassium: 4.4 mEq/L (ref 3.5–5.1)
SODIUM: 140 meq/L (ref 135–145)

## 2018-10-14 LAB — URINALYSIS, ROUTINE W REFLEX MICROSCOPIC
Bilirubin Urine: NEGATIVE
Ketones, ur: NEGATIVE
LEUKOCYTE UA: NEGATIVE
Nitrite: NEGATIVE
Specific Gravity, Urine: 1.025 (ref 1.000–1.030)
Total Protein, Urine: >=300 — AB
Urine Glucose: NEGATIVE
Urobilinogen, UA: 0.2 (ref 0.0–1.0)
pH: 6.5 (ref 5.0–8.0)

## 2018-10-14 LAB — HEPATIC FUNCTION PANEL
ALBUMIN: 3.4 g/dL — AB (ref 3.5–5.2)
ALT: 37 U/L (ref 0–53)
AST: 24 U/L (ref 0–37)
Alkaline Phosphatase: 72 U/L (ref 39–117)
Bilirubin, Direct: 0.1 mg/dL (ref 0.0–0.3)
Total Bilirubin: 0.6 mg/dL (ref 0.2–1.2)
Total Protein: 6.9 g/dL (ref 6.0–8.3)

## 2018-10-14 LAB — LIPID PANEL
Cholesterol: 207 mg/dL — ABNORMAL HIGH (ref 0–200)
HDL: 53.4 mg/dL (ref >39.00)
LDL Cholesterol: 135 mg/dL — ABNORMAL HIGH (ref 0–99)
NonHDL: 153.59
Total CHOL/HDL Ratio: 4
Triglycerides: 93 mg/dL (ref 0.0–149.0)
VLDL: 18.6 mg/dL (ref 0.0–40.0)

## 2018-10-14 LAB — TSH: TSH: 1.27 u[IU]/mL (ref 0.35–4.50)

## 2018-10-14 MED ORDER — PREDNISONE 10 MG PO TABS: ORAL_TABLET | ORAL | 0 refills | Status: DC

## 2018-10-14 MED ORDER — LEVOFLOXACIN 250 MG PO TABS: 250.0000 mg | ORAL_TABLET | Freq: Every day | ORAL | 0 refills | Status: AC

## 2018-10-14 NOTE — Patient Instructions (Addendum)
Please take all new medication as prescribed - the antibiotic, and prednisone  Please continue all other medications as before, and refills have been done if requested.  Please have the pharmacy call with any other refills you may need.  Please continue your efforts at being more active, low cholesterol diet, and weight control.  You are otherwise up to date with prevention measures today.  Please keep your appointments with your specialists as you may have planned  Please go to the LAB in the Basement (turn left off the elevator) for the tests to be done today  You will be contacted by phone if any changes need to be made immediately.  Otherwise, you will receive a letter about your results with an explanation, but please check with MyChart first.  Please remember to sign up for MyChart if you have not done so, as this will be important to you in the future with finding out test results, communicating by private email, and scheduling acute appointments online when needed.  Please return in Mar 30, or sooner if needed

## 2018-10-14 NOTE — Assessment & Plan Note (Signed)
D/w family, cont lower chol diet, delcines other med change

## 2018-10-14 NOTE — Progress Notes (Signed)
Subjective:    Patient ID: Matthew Gray, male    DOB: [birth year], 85 y.o.   MRN: [id number]  HPI  Here to f/u persistent fever, Here with acute onset mild to mod 2-3 wks ST, HA, general weakness and malaise, with prod cough greenish sputum, but Pt denies chest pain, orthopnea, PND, increased LE swelling, palpitations, dizziness or syncope, but has had worsening several days of wheezing and sob.  Was Seen at [facility] with cxr neg for pna feb 10, tx empirically with tamiflu, but essentially no better, except cough is actually somewhat less frequent.    Dementia overall stable symptomatically, and not assoc with behavioral changes such as hallucinations, paranoia, or agitation. Past Medical History:  Diagnosis Date  . ALLERGIC RHINITIS 10/27/2007  . ANKLE PAIN, RIGHT 11/30/2009  . ASTHMA 10/27/2007  . BACK PAIN 10/08/2009  . BENIGN PROSTATIC HYPERTROPHY 10/27/2007  . CHRONIC OBSTRUCTIVE PULMONARY DISEASE, ACUTE EXACERBATION 11/22/2007  . COLONIC POLYPS, HX OF 10/27/2007  . COPD 10/27/2007  . CORONARY ARTERY DISEASE 10/27/2007  . Dementia ([place]) 01/13/2013  . DEPRESSION 11/30/2009  . FATIGUE 11/30/2009  . Hemoptysis 04/24/2009  . HYPERLIPIDEMIA 04/03/2007  . HYPERTENSION 04/03/2007  . MYALGIA 02/04/2008  . Obstructive chronic bronchitis without exacerbation ([place])   . Pain in Soft Tissues of Limb 02/04/2008  . PARESTHESIA 09/02/2010  . PERIPHERAL EDEMA 11/16/2009  . Peripheral neuropathy 03/08/2012  . PERIPHERAL VASCULAR DISEASE 10/27/2007  . 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 RASH-NONVESICULAR 10/27/2007  . SCIATICA, LEFT 11/08/2009  . SHOULDER PAIN, LEFT 06/21/2009  . SINUSITIS- ACUTE-NOS 10/08/2009  . VITAMIN D DEFICIENCY 06/04/2010   Past Surgical History:  Procedure Laterality Date  . None      reports that he has never smoked. He has never used smokeless tobacco. He reports that he does not drink alcohol or use drugs. family history includes Alcohol abuse in an other family member; Anuerysm in his brother;  Cancer in his father, mother, and another family member; Hypertension in his sister; Stroke in an other family member. Allergies  Allergen Reactions  . Atorvastatin Other (See Comments)    REACTION: myalgias  . Simvastatin Other (See Comments)    REACTION: myalgias  . Zetia [Ezetimibe] Other (See Comments)    weakness  . Ace Inhibitors Other (See Comments)    unknown  . Penicillins Other (See Comments)    unknown   Current Outpatient Medications on File Prior to Visit  Medication Sig Dispense Refill  . amLODipine (NORVASC) 5 MG tablet Take 1 tablet (5 mg total) by mouth daily. 90 tablet 3  . aspirin EC 81 MG tablet Take 1 tablet (81 mg total) by mouth daily. 90 tablet 11  . cetirizine (ZYRTEC) 10 MG tablet Take 1 tablet (10 mg total) by mouth daily. 30 tablet 11  . cholestyramine light (PREVALITE) 4 g packet Take 1 packet (4 g total) by mouth 2 (two) times daily as needed. 180 packet 3  . Diclofenac Sodium (PENNSAID) 2 % SOLN Place 1 application onto the skin 2 (two) times daily as needed. 2 g 2  . donepezil (ARICEPT) 10 MG tablet Take 1 tablet (10 mg total) by mouth daily at 2 am. 90 tablet 0  . ezetimibe (ZETIA) 10 MG tablet Take 1 tablet (10 mg total) by mouth daily. 90 tablet 3  . oseltamivir (TAMIFLU) 75 MG capsule Take 1 capsule (75 mg total) by mouth every 12 (twelve) hours. 10 capsule 0  . QUEtiapine (SEROQUEL) 25 MG tablet 2 tab by  mouth in the AM, and 3 tab in the PM 150 tablet 5  . triamcinolone (NASACORT) 55 MCG/ACT AERO nasal inhaler Place 2 sprays into the nose daily. 1 Inhaler 12   No current facility-administered medications on file prior to visit.    Review of Systems  Constitutional: Negative for other unusual diaphoresis or sweats HENT: Negative for ear discharge or swelling Eyes: Negative for other worsening visual disturbances Respiratory: Negative for stridor or other swelling  Gastrointestinal: Negative for worsening distension or other blood Genitourinary:  Negative for retention or other urinary change Musculoskeletal: Negative for other MSK pain or swelling Skin: Negative for color change or other new lesions Neurological: Negative for worsening tremors and other numbness  Psychiatric/Behavioral: Negative for worsening agitation or other fatigue All other system neg pre pt    Objective:   Physical Exam BP 138/84   Pulse 87   Temp 98 F (36.7 C) (Oral)   Ht 5' 6.25" (1.683 m)   Wt 157 lb (71.2 kg)   SpO2 95%   BMI 25.15 kg/m  VS noted, mod ill Constitutional: Pt appears in NAD HENT: Head: NCAT.  Right Ear: External ear normal.  Left Ear: External ear normal.  Eyes: . Pupils are equal, round, and reactive to light. Conjunctivae and EOM are normal Bilat tm's with mild erythema.  Max sinus areas mild tender.  Pharynx with mild erythema, no exudate  Nose: without d/c or deformity Neck: Neck supple. Gross normal ROM Cardiovascular: Normal rate and regular rhythm.   Pulmonary/Chest: Effort normal and breath sounds without rales but with few bilat wheezing, no accessoary muscle use.  Abd:  Soft, NT, ND, + BS, no organomegaly Neurological: Pt is alert. At baseline orientation, motor grossly intact Skin: Skin is warm. No rashes, other new lesions, no LE edema Psychiatric: Pt behavior is normal without agitation  No other exam findings Lab Results  Component Value Date   WBC 8.3 10/14/2018   HGB 14.6 10/14/2018   HCT 44.4 10/14/2018   PLT 221.0 10/14/2018   GLUCOSE 76 10/14/2018   CHOL 207 (H) 10/14/2018   TRIG 93.0 10/14/2018   HDL 53.40 10/14/2018   LDLDIRECT 131.5 06/24/2006   LDLCALC 135 (H) 10/14/2018   ALT 37 10/14/2018   AST 24 10/14/2018   NA 140 10/14/2018   K 4.4 10/14/2018   CL 102 10/14/2018   CREATININE 1.09 10/14/2018   BUN 26 (H) 10/14/2018   CO2 32 10/14/2018   TSH 1.27 10/14/2018   PSA 1.07 03/08/2012       Assessment & Plan:

## 2018-10-14 NOTE — Assessment & Plan Note (Signed)
stable overall by history and exam, recent data reviewed with pt, and pt to continue medical treatment as before,  to f/u any worsening symptoms or concerns  

## 2018-10-14 NOTE — Assessment & Plan Note (Signed)
Mild to mod, for antibiotic and predpac asd,  to f/u any worsening symptoms or concerns

## 2018-10-24 DIAGNOSIS — Z8619 Personal history of other infectious and parasitic diseases: Secondary | ICD-10-CM

## 2018-10-24 HISTORY — DX: Personal history of other infectious and parasitic diseases: Z86.19

## 2018-11-02 ENCOUNTER — Encounter (HOSPITAL_COMMUNITY): Payer: Self-pay | Admitting: Emergency Medicine

## 2018-11-02 ENCOUNTER — Emergency Department (HOSPITAL_COMMUNITY): Payer: Medicare HMO

## 2018-11-02 ENCOUNTER — Other Ambulatory Visit: Payer: Self-pay

## 2018-11-02 ENCOUNTER — Inpatient Hospital Stay (HOSPITAL_COMMUNITY)
Admission: EM | Admit: 2018-11-02 | Discharge: 2018-11-05 | DRG: 194 | Disposition: A | Discharge status: Home / Self Care | Payer: Medicare HMO | Attending: Internal Medicine | Admitting: Internal Medicine

## 2018-11-02 DIAGNOSIS — N179 Acute kidney failure, unspecified: Secondary | ICD-10-CM | POA: Diagnosis not present

## 2018-11-02 DIAGNOSIS — J440 Chronic obstructive pulmonary disease with acute lower respiratory infection: Secondary | ICD-10-CM | POA: Diagnosis not present

## 2018-11-02 DIAGNOSIS — B0223 Postherpetic polyneuropathy: Secondary | ICD-10-CM | POA: Diagnosis present

## 2018-11-02 DIAGNOSIS — E663 Overweight: Secondary | ICD-10-CM | POA: Diagnosis present

## 2018-11-02 DIAGNOSIS — D696 Thrombocytopenia, unspecified: Secondary | ICD-10-CM | POA: Diagnosis not present

## 2018-11-02 DIAGNOSIS — J189 Pneumonia, unspecified organism: Secondary | ICD-10-CM | POA: Diagnosis present

## 2018-11-02 DIAGNOSIS — F0281 Dementia in other diseases classified elsewhere with behavioral disturbance: Secondary | ICD-10-CM

## 2018-11-02 DIAGNOSIS — R000 Tachycardia, unspecified: Secondary | ICD-10-CM | POA: Diagnosis not present

## 2018-11-02 DIAGNOSIS — E785 Hyperlipidemia, unspecified: Secondary | ICD-10-CM | POA: Diagnosis present

## 2018-11-02 DIAGNOSIS — Z8249 Family history of ischemic heart disease and other diseases of the circulatory system: Secondary | ICD-10-CM | POA: Diagnosis not present

## 2018-11-02 DIAGNOSIS — R05 Cough: Secondary | ICD-10-CM | POA: Diagnosis not present

## 2018-11-02 DIAGNOSIS — R739 Hyperglycemia, unspecified: Secondary | ICD-10-CM | POA: Diagnosis present

## 2018-11-02 DIAGNOSIS — E559 Vitamin D deficiency, unspecified: Secondary | ICD-10-CM | POA: Diagnosis present

## 2018-11-02 DIAGNOSIS — Z23 Encounter for immunization: Secondary | ICD-10-CM

## 2018-11-02 DIAGNOSIS — Z7982 Long term (current) use of aspirin: Secondary | ICD-10-CM

## 2018-11-02 DIAGNOSIS — R17 Unspecified jaundice: Secondary | ICD-10-CM | POA: Diagnosis present

## 2018-11-02 DIAGNOSIS — A419 Sepsis, unspecified organism: Secondary | ICD-10-CM | POA: Diagnosis not present

## 2018-11-02 DIAGNOSIS — Z823 Family history of stroke: Secondary | ICD-10-CM

## 2018-11-02 DIAGNOSIS — I10 Essential (primary) hypertension: Secondary | ICD-10-CM | POA: Diagnosis present

## 2018-11-02 DIAGNOSIS — Z809 Family history of malignant neoplasm, unspecified: Secondary | ICD-10-CM

## 2018-11-02 DIAGNOSIS — J449 Chronic obstructive pulmonary disease, unspecified: Secondary | ICD-10-CM | POA: Diagnosis not present

## 2018-11-02 DIAGNOSIS — Z79899 Other long term (current) drug therapy: Secondary | ICD-10-CM

## 2018-11-02 DIAGNOSIS — I1310 Hypertensive heart and chronic kidney disease without heart failure, with stage 1 through stage 4 chronic kidney disease, or unspecified chronic kidney disease: Secondary | ICD-10-CM | POA: Diagnosis present

## 2018-11-02 DIAGNOSIS — D649 Anemia, unspecified: Secondary | ICD-10-CM | POA: Diagnosis present

## 2018-11-02 DIAGNOSIS — N183 Chronic kidney disease, stage 3 (moderate): Secondary | ICD-10-CM | POA: Diagnosis present

## 2018-11-02 DIAGNOSIS — F329 Major depressive disorder, single episode, unspecified: Secondary | ICD-10-CM | POA: Diagnosis present

## 2018-11-02 DIAGNOSIS — Z8719 Personal history of other diseases of the digestive system: Secondary | ICD-10-CM

## 2018-11-02 DIAGNOSIS — I129 Hypertensive chronic kidney disease with stage 1 through stage 4 chronic kidney disease, or unspecified chronic kidney disease: Secondary | ICD-10-CM | POA: Diagnosis present

## 2018-11-02 DIAGNOSIS — J13 Pneumonia due to Streptococcus pneumoniae: Secondary | ICD-10-CM | POA: Diagnosis not present

## 2018-11-02 DIAGNOSIS — I2510 Atherosclerotic heart disease of native coronary artery without angina pectoris: Secondary | ICD-10-CM | POA: Diagnosis present

## 2018-11-02 DIAGNOSIS — G309 Alzheimer's disease, unspecified: Secondary | ICD-10-CM | POA: Diagnosis present

## 2018-11-02 DIAGNOSIS — R0602 Shortness of breath: Secondary | ICD-10-CM

## 2018-11-02 DIAGNOSIS — J181 Lobar pneumonia, unspecified organism: Secondary | ICD-10-CM

## 2018-11-02 DIAGNOSIS — F02818 Dementia in other diseases classified elsewhere, unspecified severity, with other behavioral disturbance: Secondary | ICD-10-CM | POA: Diagnosis present

## 2018-11-02 LAB — CBC WITH DIFFERENTIAL/PLATELET
Abs Immature Granulocytes: 0.03 10*3/uL (ref 0.00–0.07)
Basophils Absolute: 0 10*3/uL (ref 0.0–0.1)
Basophils Relative: 0 %
Eosinophils Absolute: 0 10*3/uL (ref 0.0–0.5)
Eosinophils Relative: 0 %
HCT: 47.1 % (ref 39.0–52.0)
Hemoglobin: 14.7 g/dL (ref 13.0–17.0)
Immature Granulocytes: 0 %
Lymphocytes Relative: 2 %
Lymphs Abs: 0.2 10*3/uL — ABNORMAL LOW (ref 0.7–4.0)
MCH: 25.9 pg — ABNORMAL LOW (ref 26.0–34.0)
MCHC: 31.2 g/dL (ref 30.0–36.0)
MCV: 83.1 fL (ref 80.0–100.0)
Monocytes Absolute: 0.3 10*3/uL (ref 0.1–1.0)
Monocytes Relative: 3 %
Neutro Abs: 11.1 10*3/uL — ABNORMAL HIGH (ref 1.7–7.7)
Neutrophils Relative %: 95 %
Platelets: 180 10*3/uL (ref 150–400)
RBC: 5.67 MIL/uL (ref 4.22–5.81)
RDW: 15.5 % (ref 11.5–15.5)
WBC: 11.7 10*3/uL — ABNORMAL HIGH (ref 4.0–10.5)
nRBC: 0 % (ref 0.0–0.2)

## 2018-11-02 LAB — STREP PNEUMONIAE URINARY ANTIGEN: Strep Pneumo Urinary Antigen: POSITIVE — AB

## 2018-11-02 LAB — COMPREHENSIVE METABOLIC PANEL
ALT: 14 U/L (ref 0–44)
ANION GAP: 9 (ref 5–15)
AST: 21 U/L (ref 15–41)
Albumin: 3.3 g/dL — ABNORMAL LOW (ref 3.5–5.0)
Alkaline Phosphatase: 74 U/L (ref 38–126)
BILIRUBIN TOTAL: 1.1 mg/dL (ref 0.3–1.2)
BUN: 19 mg/dL (ref 8–23)
CALCIUM: 9.1 mg/dL (ref 8.9–10.3)
CO2: 28 mmol/L (ref 22–32)
Chloride: 100 mmol/L (ref 98–111)
Creatinine, Ser: 1.23 mg/dL (ref 0.61–1.24)
GFR calc Af Amer: >60 mL/min (ref >60)
GFR calc non Af Amer: 54 mL/min — ABNORMAL LOW (ref >60)
GLUCOSE: 104 mg/dL — AB (ref 70–99)
Potassium: 4.4 mmol/L (ref 3.5–5.1)
Sodium: 137 mmol/L (ref 135–145)
Total Protein: 7 g/dL (ref 6.5–8.1)

## 2018-11-02 LAB — URINALYSIS, ROUTINE W REFLEX MICROSCOPIC
Bilirubin Urine: NEGATIVE
Glucose, UA: NEGATIVE mg/dL
Ketones, ur: NEGATIVE mg/dL
Leukocytes,Ua: NEGATIVE
Nitrite: NEGATIVE
Protein, ur: 100 mg/dL — AB
Specific Gravity, Urine: 1.013 (ref 1.005–1.030)
pH: 6 (ref 5.0–8.0)

## 2018-11-02 LAB — INFLUENZA PANEL BY PCR (TYPE A & B)
Influenza A By PCR: NEGATIVE
Influenza B By PCR: NEGATIVE

## 2018-11-02 LAB — LACTIC ACID, PLASMA
Lactic Acid, Venous: 1.5 mmol/L (ref 0.5–1.9)
Lactic Acid, Venous: 1.2 mmol/L (ref 0.5–1.9)

## 2018-11-02 LAB — PROTIME-INR
INR: 1 (ref 0.8–1.2)
Prothrombin Time: 13.4 seconds (ref 11.4–15.2)

## 2018-11-02 MED ORDER — ACETAMINOPHEN 650 MG RE SUPP: 650.0000 mg | Freq: Four times a day (QID) | RECTAL | Status: DC | PRN

## 2018-11-02 MED ORDER — INFLUENZA VAC SPLIT HIGH-DOSE 0.5 ML IM SUSY
0.5000 mL | PREFILLED_SYRINGE | INTRAMUSCULAR | Status: AC
  Administered 2018-11-03: 0.5 mL via INTRAMUSCULAR
  Filled 2018-11-02: qty 0.5

## 2018-11-02 MED ORDER — DONEPEZIL HCL 10 MG PO TABS
10.0000 mg | ORAL_TABLET | Freq: Every day | ORAL | Status: DC
  Administered 2018-11-02 – 2018-11-04 (×3): 10 mg via ORAL
  Filled 2018-11-02 (×3): qty 1

## 2018-11-02 MED ORDER — ALBUTEROL SULFATE (2.5 MG/3ML) 0.083% IN NEBU: 2.5000 mg | INHALATION_SOLUTION | RESPIRATORY_TRACT | Status: DC | PRN

## 2018-11-02 MED ORDER — QUETIAPINE FUMARATE 25 MG PO TABS
75.0000 mg | ORAL_TABLET | Freq: Every day | ORAL | Status: DC
  Administered 2018-11-02 – 2018-11-04 (×3): 75 mg via ORAL
  Filled 2018-11-02 (×3): qty 3

## 2018-11-02 MED ORDER — ACETAMINOPHEN 325 MG PO TABS: 650.0000 mg | ORAL_TABLET | Freq: Four times a day (QID) | ORAL | Status: DC | PRN

## 2018-11-02 MED ORDER — ASPIRIN EC 81 MG PO TBEC
81.0000 mg | DELAYED_RELEASE_TABLET | Freq: Every day | ORAL | Status: DC
  Administered 2018-11-03 – 2018-11-05 (×3): 81 mg via ORAL
  Filled 2018-11-02 (×3): qty 1

## 2018-11-02 MED ORDER — PNEUMOCOCCAL VAC POLYVALENT 25 MCG/0.5ML IJ INJ
0.5000 mL | INJECTION | INTRAMUSCULAR | Status: AC
  Administered 2018-11-03: 0.5 mL via INTRAMUSCULAR

## 2018-11-02 MED ORDER — ACETAMINOPHEN 500 MG PO TABS: 1000.0000 mg | ORAL_TABLET | Freq: Once | ORAL | Status: DC

## 2018-11-02 MED ORDER — ACETAMINOPHEN 325 MG PO TABS
650.0000 mg | ORAL_TABLET | Freq: Once | ORAL | Status: AC | PRN
  Administered 2018-11-02: 650 mg via ORAL
  Filled 2018-11-02: qty 2

## 2018-11-02 MED ORDER — AMLODIPINE BESYLATE 5 MG PO TABS
5.0000 mg | ORAL_TABLET | Freq: Every day | ORAL | Status: DC
  Administered 2018-11-03 – 2018-11-05 (×3): 5 mg via ORAL
  Filled 2018-11-02 (×3): qty 1

## 2018-11-02 MED ORDER — ENOXAPARIN SODIUM 40 MG/0.4ML ~~LOC~~ SOLN
40.0000 mg | SUBCUTANEOUS | Status: DC
  Administered 2018-11-03 – 2018-11-05 (×3): 40 mg via SUBCUTANEOUS
  Filled 2018-11-02 (×3): qty 0.4

## 2018-11-02 MED ORDER — SODIUM CHLORIDE 0.9 % IV BOLUS
1000.0000 mL | Freq: Once | INTRAVENOUS | Status: AC
  Administered 2018-11-02: 1000 mL via INTRAVENOUS

## 2018-11-02 MED ORDER — SODIUM CHLORIDE 0.9% FLUSH
3.0000 mL | Freq: Once | INTRAVENOUS | Status: AC
  Administered 2018-11-02: 3 mL via INTRAVENOUS

## 2018-11-02 MED ORDER — QUETIAPINE FUMARATE 25 MG PO TABS
50.0000 mg | ORAL_TABLET | Freq: Every day | ORAL | Status: DC
  Administered 2018-11-03 – 2018-11-05 (×3): 50 mg via ORAL
  Filled 2018-11-02 (×3): qty 2

## 2018-11-02 MED ORDER — DOXYCYCLINE HYCLATE 100 MG PO TABS
100.0000 mg | ORAL_TABLET | Freq: Two times a day (BID) | ORAL | Status: DC
  Administered 2018-11-02 – 2018-11-03 (×2): 100 mg via ORAL
  Filled 2018-11-02 (×2): qty 1

## 2018-11-02 MED ORDER — SODIUM CHLORIDE 0.9 % IV SOLN
1.0000 g | INTRAVENOUS | Status: DC
  Administered 2018-11-02 – 2018-11-04 (×3): 1 g via INTRAVENOUS
  Filled 2018-11-02 (×4): qty 10

## 2018-11-02 MED ORDER — SODIUM CHLORIDE 0.9 % IV SOLN
100.0000 mg | Freq: Once | INTRAVENOUS | Status: AC
  Administered 2018-11-02: 100 mg via INTRAVENOUS
  Filled 2018-11-02: qty 100

## 2018-11-02 MED ORDER — ADULT MULTIVITAMIN W/MINERALS CH
1.0000 | ORAL_TABLET | Freq: Every day | ORAL | Status: DC
  Administered 2018-11-03 – 2018-11-05 (×3): 1 via ORAL
  Filled 2018-11-02 (×3): qty 1

## 2018-11-02 NOTE — ED Notes (Signed)
ED TO INPATIENT HANDOFF REPORT  ED Nurse Name and Phone #: Harleigh Civello [phone]  S Name/Age/Gender [name] 85 y.o. male Room/Bed: 020C/020C  Code Status   Code Status: Not on file  Home/SNF/Other Home Patient oriented to: self Is this baseline? Yes   Triage Complete: Triage complete  Chief Complaint Fever  Triage Note Pt presents with fever and cough. Per Adult Day Care the patients fever was 101. Denies n/v/d. Per daughter the cough is white/yellowish and productive.    Allergies Allergies  Allergen Reactions  . Atorvastatin Other (See Comments)    REACTION: myalgias  . Simvastatin Other (See Comments)    REACTION: myalgias  . Zetia [Ezetimibe] Other (See Comments)    weakness  . Ace Inhibitors Other (See Comments)    unknown  . Penicillins Other (See Comments)    unknown    Level of Care/Admitting Diagnosis ED Disposition    ED Disposition Condition [facility] Area: [place] [[id number]]  Level of Care: Med-Surg [16]  Diagnosis: Pneumonia [[id number]]  Admitting Physician: Barb Merino [[id number]]  Attending Physician: Barb Merino [[id number]]  Estimated length of stay: past midnight tomorrow  Certification:: I certify this patient will need inpatient services for at least 2 midnights  PT Class (Do Not Modify): Inpatient [101]  PT Acc Code (Do Not Modify): Private [1]       B Medical/Surgery History Past Medical History:  Diagnosis Date  . ALLERGIC RHINITIS 10/27/2007  . ANKLE PAIN, RIGHT 11/30/2009  . ASTHMA 10/27/2007  . BACK PAIN 10/08/2009  . BENIGN PROSTATIC HYPERTROPHY 10/27/2007  . CHRONIC OBSTRUCTIVE PULMONARY DISEASE, ACUTE EXACERBATION 11/22/2007  . COLONIC POLYPS, HX OF 10/27/2007  . COPD 10/27/2007  . CORONARY ARTERY DISEASE 10/27/2007  . Dementia ([place]) 01/13/2013  . DEPRESSION 11/30/2009  . FATIGUE 11/30/2009  . Hemoptysis 04/24/2009  . HYPERLIPIDEMIA 04/03/2007  . HYPERTENSION 04/03/2007  . MYALGIA 02/04/2008  . Obstructive  chronic bronchitis without exacerbation ([place])   . Pain in Soft Tissues of Limb 02/04/2008  . PARESTHESIA 09/02/2010  . PERIPHERAL EDEMA 11/16/2009  . Peripheral neuropathy 03/08/2012  . PERIPHERAL VASCULAR DISEASE 10/27/2007  . 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 RASH-NONVESICULAR 10/27/2007  . SCIATICA, LEFT 11/08/2009  . SHOULDER PAIN, LEFT 06/21/2009  . SINUSITIS- ACUTE-NOS 10/08/2009  . VITAMIN D DEFICIENCY 06/04/2010   Past Surgical History:  Procedure Laterality Date  . None       A IV Location/Drains/Wounds Patient Lines/Drains/Airways Status   Active Line/Drains/Airways    Name:   Placement date:   Placement time:   Site:   Days:   Peripheral IV 11/02/18 Right;Upper Forearm   11/02/18    1354    Forearm   less than 1          Intake/Output Last 24 hours No intake or output data in the 24 hours ending 11/02/18 1621  Labs/Imaging Results for orders placed or performed during the hospital encounter of 11/02/18 (from the past 48 hour(s))  Lactic acid, plasma     Status: None   Collection Time: 11/02/18  1:18 PM  Result Value Ref Range   Lactic Acid, Venous 1.5 0.5 - 1.9 mmol/L    Comment: Performed at [facility], [address] [place], [place] [address]  Comprehensive metabolic panel     Status: Abnormal   Collection Time: 11/02/18  1:18 PM  Result Value Ref Range   Sodium 137 135 - 145 mmol/L   Potassium 4.4 3.5 -  5.1 mmol/L   Chloride 100 98 - 111 mmol/L   CO2 28 22 - 32 mmol/L   Glucose, Bld 104 (H) 70 - 99 mg/dL   BUN 19 8 - 23 mg/dL   Creatinine, Ser 1.23 0.61 - 1.24 mg/dL   Calcium 9.1 8.9 - 10.3 mg/dL   Total Protein 7.0 6.5 - 8.1 g/dL   Albumin 3.3 (L) 3.5 - 5.0 g/dL   AST 21 15 - 41 U/L   ALT 14 0 - 44 U/L   Alkaline Phosphatase 74 38 - 126 U/L   Total Bilirubin 1.1 0.3 - 1.2 mg/dL   GFR calc non Af Amer 54 (L) >60 mL/min   GFR calc Af Amer >60 >60 mL/min   Anion gap 9 5 - 15    Comment: Performed at [facility], [address] [place], [place] [address]  CBC with Differential     Status: Abnormal   Collection Time: 11/02/18  1:18 PM  Result Value Ref Range   WBC 11.7 (H) 4.0 - 10.5 K/uL   RBC 5.67 4.22 - 5.81 MIL/uL   Hemoglobin 14.7 13.0 - 17.0 g/dL   HCT 47.1 39.0 - 52.0 %   MCV 83.1 80.0 - 100.0 fL   MCH 25.9 (L) 26.0 - 34.0 pg   MCHC 31.2 30.0 - 36.0 g/dL   RDW 15.5 11.5 - 15.5 %   Platelets 180 150 - 400 K/uL   nRBC 0.0 0.0 - 0.2 %   Neutrophils Relative % 95 %   Neutro Abs 11.1 (H) 1.7 - 7.7 K/uL   Lymphocytes Relative 2 %   Lymphs Abs 0.2 (L) 0.7 - 4.0 K/uL   Monocytes Relative 3 %   Monocytes Absolute 0.3 0.1 - 1.0 K/uL   Eosinophils Relative 0 %   Eosinophils Absolute 0.0 0.0 - 0.5 K/uL   Basophils Relative 0 %   Basophils Absolute 0.0 0.0 - 0.1 K/uL   Immature Granulocytes 0 %   Abs Immature Granulocytes 0.03 0.00 - 0.07 K/uL    Comment: Performed at [facility], [place] [address], [place], [place] [address]  Protime-INR     Status: None   Collection Time: 11/02/18  1:18 PM  Result Value Ref Range   Prothrombin Time 13.4 11.4 - 15.2 seconds   INR 1.0 0.8 - 1.2    Comment: (NOTE) INR goal varies based on device and disease states. Performed at [facility], [place] [address], [place], [place] [address]   Influenza panel by PCR (type A & B)     Status: None   Collection Time: 11/02/18  1:55 PM  Result Value Ref Range   Influenza A By PCR NEGATIVE NEGATIVE   Influenza B By PCR NEGATIVE NEGATIVE    Comment: (NOTE) The Xpert Xpress Flu assay is intended as an aid in the diagnosis of  influenza and should not be used as a sole basis for treatment.  This  assay is FDA approved for nasopharyngeal swab specimens only. Nasal  washings and aspirates are unacceptable for Xpert Xpress Flu testing. Performed at [facility], [place] [address], [place],  [address]   Urinalysis, Routine w reflex microscopic     Status: Abnormal   Collection Time: 11/02/18  3:38 PM  Result Value  Ref Range   Color, Urine YELLOW YELLOW   APPearance HAZY (A) CLEAR   Specific Gravity, Urine 1.013 1.005 - 1.030   pH 6.0 5.0 - 8.0  Glucose, UA NEGATIVE NEGATIVE mg/dL   Hgb urine dipstick SMALL (A) NEGATIVE   Bilirubin Urine NEGATIVE NEGATIVE   Ketones, ur NEGATIVE NEGATIVE mg/dL   Protein, ur 100 (A) NEGATIVE mg/dL   Nitrite NEGATIVE NEGATIVE   Leukocytes,Ua NEGATIVE NEGATIVE   RBC / HPF 0-5 0 - 5 RBC/hpf   WBC, UA 0-5 0 - 5 WBC/hpf   Bacteria, UA RARE (A) NONE SEEN   Mucus PRESENT     Comment: Performed at [facility] [address] [place], [place] [address]   Dg Chest 2 View  Result Date: 11/02/2018 CLINICAL DATA:  Cough and fever EXAM: CHEST - 2 VIEW COMPARISON:  October 04, 2018 FINDINGS: There is airspace consolidation in the left lower lobe with small left pleural effusion. There is mild right base atelectasis. Heart is mildly enlarged with pulmonary vascularity normal. No adenopathy. There is aortic atherosclerosis. No bone lesions. IMPRESSION: Airspace consolidation consistent with pneumonia in the left lower lobe with small left pleural effusion. There is mild right base atelectasis. There is mild cardiomegaly. Aortic Atherosclerosis (ICD10-I70.0). Followup PA and lateral chest radiographs recommended in 3-4 weeks following trial of antibiotic therapy to ensure resolution and exclude underlying malignancy. Electronically Signed   By: Lowella Grip III M.D.   On: 11/02/2018 14:54    Pending Labs Unresulted Labs (From admission, onward)    Start     Ordered   11/02/18 1321  Culture, blood (Routine x 2)  BLOOD CULTURE X 2,   STAT     11/02/18 1321   11/02/18 1318  Lactic acid, plasma  Now then every 2 hours,   STAT     11/02/18 1317   Signed and Held  Culture, sputum-assessment  Once,   R     Signed and Held   Signed and Held  Gram stain  Once,   R     Signed and Held   Signed and Held  Strep pneumoniae urinary antigen  Once,   R     Signed and Held    Signed and Held  Legionella Pneumophila Serogp 1 Ur Ag  Once,   R     Signed and Held          Vitals/Pain Today's Vitals   11/02/18 1515 11/02/18 1545 11/02/18 1600 11/02/18 1615  BP: (!) 144/93 (!) 114/57 127/81 125/85  Pulse: (!) 107 91 97 94  Resp: (!) 23 (!) 24 (!) 24 (!) 21  Temp:      TempSrc:      SpO2: 92% 94% 92% 92%    Isolation Precautions Droplet precaution  Medications Medications  acetaminophen (TYLENOL) tablet 1,000 mg (1,000 mg Oral Not Given 11/02/18 1354)  doxycycline (VIBRAMYCIN) 100 mg in sodium chloride 0.9 % 250 mL IVPB (100 mg Intravenous New Bag/Given 11/02/18 1502)  sodium chloride flush (NS) 0.9 % injection 3 mL (3 mLs Intravenous Given 11/02/18 1353)  acetaminophen (TYLENOL) tablet 650 mg (650 mg Oral Given 11/02/18 1323)  sodium chloride 0.9 % bolus 1,000 mL (1,000 mLs Intravenous New Bag/Given 11/02/18 1354)    Mobility walks with person assist Moderate fall risk   Focused Assessments Cardiac Assessment Handoff:  Cardiac Rhythm: Sinus tachycardia Lab Results  Component Value Date   CKTOTAL 89 03/27/2016   TROPONINI <0.03 11/02/2015   No results found for: DDIMER Does the Patient currently have chest pain? No     R Recommendations: See Admitting Provider Note  Report given to:   Additional Notes:

## 2018-11-02 NOTE — ED Provider Notes (Signed)
[facility] Emergency Department Provider Note MRN:  [id number]  Arrival date & time: 11/02/18     Chief Complaint   Fever and Cough   History of Present Illness   Matthew Gray is a 85 y.o. year-old male with a history of COPD, CAD presenting to the ED with chief complaint of cough, fever.  Diagnosed with the flu clinically a few weeks ago, completed Tamiflu.  Has continued to have flulike symptoms, persistent cough now productive, last night daughter notes shaking chills at home.  Continue general malaise, low energy.  Review of Systems  A complete 10 system review of systems was obtained and all systems are negative except as noted in the HPI and PMH.   Patient's Health History    Past Medical History:  Diagnosis Date  . ALLERGIC RHINITIS 10/27/2007  . ANKLE PAIN, RIGHT 11/30/2009  . ASTHMA 10/27/2007  . BACK PAIN 10/08/2009  . BENIGN PROSTATIC HYPERTROPHY 10/27/2007  . CHRONIC OBSTRUCTIVE PULMONARY DISEASE, ACUTE EXACERBATION 11/22/2007  . COLONIC POLYPS, HX OF 10/27/2007  . COPD 10/27/2007  . CORONARY ARTERY DISEASE 10/27/2007  . Dementia ([place]) 01/13/2013  . DEPRESSION 11/30/2009  . FATIGUE 11/30/2009  . Hemoptysis 04/24/2009  . HYPERLIPIDEMIA 04/03/2007  . HYPERTENSION 04/03/2007  . MYALGIA 02/04/2008  . Obstructive chronic bronchitis without exacerbation ([place])   . Pain in Soft Tissues of Limb 02/04/2008  . PARESTHESIA 09/02/2010  . PERIPHERAL EDEMA 11/16/2009  . Peripheral neuropathy 03/08/2012  . PERIPHERAL VASCULAR DISEASE 10/27/2007  . 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 RASH-NONVESICULAR 10/27/2007  . SCIATICA, LEFT 11/08/2009  . SHOULDER PAIN, LEFT 06/21/2009  . SINUSITIS- ACUTE-NOS 10/08/2009  . VITAMIN D DEFICIENCY 06/04/2010    Past Surgical History:  Procedure Laterality Date  . None      Family History  Problem Relation Age of Onset  . Cancer Mother        BREAST  . Cancer Father        LUNG  . Hypertension Sister        3 sisters  . Anuerysm Brother          died with  . Stroke Other   . Cancer Other        lung and breast  . Alcohol abuse Other     Social History   Socioeconomic History  . Marital status: Divorced    Spouse name: Not on file  . Number of children: 3  . Years of education: 21  . Highest education level: Not on file  Occupational History    Employer: RETIRED  Social Needs  . Financial resource strain: Not on file  . Food insecurity:    Worry: Not on file    Inability: Not on file  . Transportation needs:    Medical: Not on file    Non-medical: Not on file  Tobacco Use  . Smoking status: Never Smoker  . Smokeless tobacco: Never Used  . Tobacco comment: Quit ten years ago.  Substance and Sexual Activity  . Alcohol use: No  . Drug use: No  . Sexual activity: Never  Lifestyle  . Physical activity:    Days per week: Not on file    Minutes per session: Not on file  . Stress: Not on file  Relationships  . Social connections:    Talks on phone: Not on file    Gets together: Not on file    Attends religious service: Not on file    Active member of club  or organization: Not on file    Attends meetings of clubs or organizations: Not on file    Relationship status: Not on file  . Intimate partner violence:    Fear of current or ex partner: Not on file    Emotionally abused: Not on file    Physically abused: Not on file    Forced sexual activity: Not on file  Other Topics Concern  . Not on file  Social History Narrative   He had 12 years of education, retired from [employer] at age 80, lives alone for 68 years, has 3 children   Patient right handed.   Caffeine-  Coffee three times a week.     Physical Exam  Vital Signs and Nursing Notes reviewed Vitals:   11/02/18 1445 11/02/18 1500  BP: 130/85 (!) 149/99  Pulse: 95 (!) 110  Resp: (!) 21 (!) 23  Temp:    SpO2: 93% 91%    CONSTITUTIONAL: Well-appearing, NAD NEURO:  Alert and oriented x 3, no focal deficits EYES:  eyes equal and  reactive ENT/NECK:  no LAD, no JVD CARDIO: Tachycardic rate, well-perfused, normal S1 and S2 PULM: Scattered rhonchi GI/GU:  normal bowel sounds, non-distended, non-tender MSK/SPINE:  No gross deformities, no edema SKIN:  no rash, atraumatic PSYCH:  Appropriate speech and behavior  Diagnostic and Interventional Summary    EKG Interpretation  Date/Time:  Tuesday November 02 2018 14:31:35 EDT Ventricular Rate:  96 PR Interval:    QRS Duration: 151 QT Interval:  387 QTC Calculation: 490 R Axis:   43 Text Interpretation:  Sinus rhythm Right bundle branch block Baseline wander in lead(s) II III aVF Confirmed by Gerlene Fee [phone]) on 11/02/2018 2:51:06 PM      Labs Reviewed  COMPREHENSIVE METABOLIC PANEL - Abnormal; Notable for the following components:      Result Value   Glucose, Bld 104 (*)    Albumin 3.3 (*)    GFR calc non Af Amer 54 (*)    All other components within normal limits  CBC WITH DIFFERENTIAL/PLATELET - Abnormal; Notable for the following components:   WBC 11.7 (*)    MCH 25.9 (*)    Neutro Abs 11.1 (*)    Lymphs Abs 0.2 (*)    All other components within normal limits  CULTURE, BLOOD (ROUTINE X 2)  CULTURE, BLOOD (ROUTINE X 2)  LACTIC ACID, PLASMA  PROTIME-INR  LACTIC ACID, PLASMA  URINALYSIS, ROUTINE W REFLEX MICROSCOPIC  INFLUENZA PANEL BY PCR (TYPE A & B)    DG Chest 2 View  Final Result      Medications  acetaminophen (TYLENOL) tablet 1,000 mg (1,000 mg Oral Not Given 11/02/18 1354)  doxycycline (VIBRAMYCIN) 100 mg in sodium chloride 0.9 % 250 mL IVPB (100 mg Intravenous New Bag/Given 11/02/18 1502)  sodium chloride flush (NS) 0.9 % injection 3 mL (3 mLs Intravenous Given 11/02/18 1353)  acetaminophen (TYLENOL) tablet 650 mg (650 mg Oral Given 11/02/18 1323)  sodium chloride 0.9 % bolus 1,000 mL (1,000 mLs Intravenous New Bag/Given 11/02/18 1354)     Procedures Critical Care Critical Care Documentation Critical care time provided by me (excluding  procedures): 34 minutes  Condition necessitating critical care: Sepsis  Components of critical care management: reviewing of prior records, laboratory and imaging interpretation, frequent re-examination and reassessment of vital signs, administration of IV fluids, IV antibiotics, discussion with consulting services    ED Course and Medical Decision Making  I have reviewed the triage vital signs and  the nursing notes.  Pertinent labs & imaging results that were available during my care of the patient were reviewed by me and considered in my medical decision making (see below for details).  Concern for sepsis, possibly MRSA pneumonia given patient's prodrome of influenza-like illness.  Will cover with doxycycline, provide IV fluids, labs and chest x-ray pending.  Given the rigors at home, will request admission.   Barth Kirks. [place] Small, [place] [email or web address]  Final Clinical Impressions(s) / ED Diagnoses     ICD-10-CM   1. Sepsis, due to unspecified organism, unspecified whether acute organ dysfunction present [facility]) A41.9     ED Discharge Orders    None         Maudie Flakes, MD 11/02/18 [id number]

## 2018-11-02 NOTE — Plan of Care (Addendum)
Patient received from ED. Admission assessment complete. All needs met at bedside. Patient placed in low bed. Patients daughter provided an update. CTX due this evening at 8PM. Urine and Sputum are due to be collect, both patient and his daughter informed.

## 2018-11-02 NOTE — ED Triage Notes (Signed)
Pt presents with fever and cough. Per Adult Day Care the patients fever was 101. Denies n/v/d. Per daughter the cough is white/yellowish and productive.

## 2018-11-02 NOTE — ED Notes (Signed)
Pt will not allow this RN to take his temperature.

## 2018-11-02 NOTE — H&P (Signed)
History and Physical    Matthew Gray GUR:[id number] DOB: [birth year] DOA: 11/02/2018  PCP: Biagio Borg, MD  Patient coming from: home   I have personally briefly reviewed patient's old medical records available.   Chief Complaint: cough and fever  HPI: Matthew Gray is a 85 y.o. male with medical history significant of dementia, COPD, coronary artery disease, hyperlipidemia who presents to the emergency room with his daughter with fever and cough.  Patient is poor historian probably due to underlying cognitive dysfunction.  History is supplemented by daughter at the bedside.  According to her, he developed some cough and congestion about 3 weeks ago and was treated with Tamiflu.  After about a week he had another bout of cough and was given antibiotics.  He has been quite lethargic and coughing all throughout.  Last night her daughter noted him having shaking and chills.  He went to his adult daycare today, his temperature was 101 so was sent to the hospital.  Patient himself complains of cough with mucoid sputum and occasionally yellow-tinged on it.  He denies any chest pain.  Wheezing present but no shortness of breath.  Denies any nausea vomiting.  Urine and bowel habits are normal. ED Course: Patient is febrile with temperature 103.  Blood pressures in heart rate normal.  Lactic acid is normal.  WBC is 11.7.  Chest x-ray shows left lower lobe pneumonia.  Patient was given IV fluids and was given IV doxycycline 100 mg in the ER after drawing blood cultures.  This patient has significant symptoms including high fever.  He has failed outpatient antibiotic therapy and has recurrent symptoms.  He will need inpatient hospitalization and treatment with IV antibiotics.  Anticipating staying more than 2 midnights in the hospital.  Review of Systems: As per HPI otherwise 10 point review of systems negative.    Past Medical History:  Diagnosis Date  . ALLERGIC RHINITIS 10/27/2007  . ANKLE PAIN, RIGHT  11/30/2009  . ASTHMA 10/27/2007  . BACK PAIN 10/08/2009  . BENIGN PROSTATIC HYPERTROPHY 10/27/2007  . CHRONIC OBSTRUCTIVE PULMONARY DISEASE, ACUTE EXACERBATION 11/22/2007  . COLONIC POLYPS, HX OF 10/27/2007  . COPD 10/27/2007  . CORONARY ARTERY DISEASE 10/27/2007  . Dementia ([place]) 01/13/2013  . DEPRESSION 11/30/2009  . FATIGUE 11/30/2009  . Hemoptysis 04/24/2009  . HYPERLIPIDEMIA 04/03/2007  . HYPERTENSION 04/03/2007  . MYALGIA 02/04/2008  . Obstructive chronic bronchitis without exacerbation ([place])   . Pain in Soft Tissues of Limb 02/04/2008  . PARESTHESIA 09/02/2010  . PERIPHERAL EDEMA 11/16/2009  . Peripheral neuropathy 03/08/2012  . PERIPHERAL VASCULAR DISEASE 10/27/2007  . 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 RASH-NONVESICULAR 10/27/2007  . SCIATICA, LEFT 11/08/2009  . SHOULDER PAIN, LEFT 06/21/2009  . SINUSITIS- ACUTE-NOS 10/08/2009  . VITAMIN D DEFICIENCY 06/04/2010    Past Surgical History:  Procedure Laterality Date  . None       reports that he has never smoked. He has never used smokeless tobacco. He reports that he does not drink alcohol or use drugs.  Allergies  Allergen Reactions  . Atorvastatin Other (See Comments)    REACTION: myalgias  . Simvastatin Other (See Comments)    REACTION: myalgias  . Zetia [Ezetimibe] Other (See Comments)    weakness  . Ace Inhibitors Other (See Comments)    unknown  . Penicillins Other (See Comments)    unknown    Family History  Problem Relation Age of Onset  . Cancer Mother        BREAST  .  Cancer Father        LUNG  . Hypertension Sister        3 sisters  . Anuerysm Brother        died with  . Stroke Other   . Cancer Other        lung and breast  . Alcohol abuse Other      Prior to Admission medications   Medication Sig Start Date End Date Taking? Authorizing Provider  amLODipine (NORVASC) 5 MG tablet Take 1 tablet (5 mg total) by mouth daily. 05/24/18  Yes Biagio Borg, MD  aspirin EC 81 MG tablet Take 1 tablet (81 mg total) by  mouth daily. 10/22/17  Yes Biagio Borg, MD  cetirizine (ZYRTEC) 10 MG tablet Take 1 tablet (10 mg total) by mouth daily. 05/24/18 05/24/19 Yes Biagio Borg, MD  donepezil (ARICEPT) 10 MG tablet Take 1 tablet (10 mg total) by mouth daily at 2 am. 08/04/18  Yes Biagio Borg, MD  Multiple Vitamins-Minerals (ALIVE MENS ENERGY) TABS Take 1 tablet by mouth daily.   Yes [provider]  QUEtiapine (SEROQUEL) 25 MG tablet 2 tab by mouth in the AM, and 3 tab in the PM Patient taking differently: Take 50-75 mg by mouth 2 (two) times daily. 2 tab (50mg ) by mouth in the AM, and 3 tab (75mg ) in the PM 05/24/18  Yes Biagio Borg, MD    Physical Exam: Vitals:   11/02/18 1445 11/02/18 1500 11/02/18 1515 11/02/18 1545  BP: 130/85 (!) 149/99 (!) 144/93 (!) 114/57  Pulse: 95 (!) 110 (!) 107 91  Resp: (!) 21 (!) 23 (!) 23 (!) 24  Temp:      TempSrc:      SpO2: 93% 91% 92% 94%    Constitutional: NAD, calm, comfortable Vitals:   11/02/18 1445 11/02/18 1500 11/02/18 1515 11/02/18 1545  BP: 130/85 (!) 149/99 (!) 144/93 (!) 114/57  Pulse: 95 (!) 110 (!) 107 91  Resp: (!) 21 (!) 23 (!) 23 (!) 24  Temp:      TempSrc:      SpO2: 93% 91% 92% 94%   Eyes: PERRL, lids and conjunctivae normal Patient looks fairly comfortable on room air. ENMT: Mucous membranes are moist. Posterior pharynx clear of any exudate or lesions.Normal dentition.  Neck: normal, supple, no masses, no thyromegaly Respiratory: Bilateral expiratory wheezes and crackles.  Conducted airway sounds present all throughout the lung fields.  No accessory muscle use.  Cardiovascular: Regular rate and rhythm, no murmurs / rubs / gallops. No extremity edema. 2+ pedal pulses. No carotid bruits.  Abdomen: no tenderness, no masses palpated. No hepatosplenomegaly. Bowel sounds positive.  Musculoskeletal: no clubbing / cyanosis. No joint deformity upper and lower extremities. Good ROM, no contractures. Normal muscle tone.  Skin: no rashes,  lesions, ulcers. No induration Neurologic: CN 2-12 grossly intact. Sensation intact, DTR normal. Strength 5/5 in all 4.  Psychiatric: Normal judgment and insight. Alert and oriented x 3.  Anxious.    Labs on Admission: I have personally reviewed following labs and imaging studies  CBC: Recent Labs  Lab 11/02/18 1318  WBC 11.7*  NEUTROABS 11.1*  HGB 14.7  HCT 47.1  MCV 83.1  PLT 673   Basic Metabolic Panel: Recent Labs  Lab 11/02/18 1318  NA 137  K 4.4  CL 100  CO2 28  GLUCOSE 104*  BUN 19  CREATININE 1.23  CALCIUM 9.1   GFR: CrCl cannot be calculated (Unknown ideal  weight.). Liver Function Tests: Recent Labs  Lab 11/02/18 1318  AST 21  ALT 14  ALKPHOS 74  BILITOT 1.1  PROT 7.0  ALBUMIN 3.3*   No results for input(s): LIPASE, AMYLASE in the last 168 hours. No results for input(s): AMMONIA in the last 168 hours. Coagulation Profile: Recent Labs  Lab 11/02/18 1318  INR 1.0   Cardiac Enzymes: No results for input(s): CKTOTAL, CKMB, CKMBINDEX, TROPONINI in the last 168 hours. BNP (last 3 results) No results for input(s): PROBNP in the last 8760 hours. HbA1C: No results for input(s): HGBA1C in the last 72 hours. CBG: No results for input(s): GLUCAP in the last 168 hours. Lipid Profile: No results for input(s): CHOL, HDL, LDLCALC, TRIG, CHOLHDL, LDLDIRECT in the last 72 hours. Thyroid Function Tests: No results for input(s): TSH, T4TOTAL, FREET4, T3FREE, THYROIDAB in the last 72 hours. Anemia Panel: No results for input(s): VITAMINB12, FOLATE, FERRITIN, TIBC, IRON, RETICCTPCT in the last 72 hours. Urine analysis:    Component Value Date/Time   COLORURINE YELLOW 10/14/2018 [place] 10/14/2018 1143   LABSPEC 1.025 10/14/2018 1143   PHURINE 6.5 10/14/2018 1143   GLUCOSEU NEGATIVE 10/14/2018 1143   HGBUR MODERATE (A) 10/14/2018 1143   BILIRUBINUR NEGATIVE 10/14/2018 1143   BILIRUBINUR small (A) 11/02/2015 1712   KETONESUR NEGATIVE  10/14/2018 1143   PROTEINUR =100 (A) 11/02/2015 1712   PROTEINUR NEGATIVE 02/25/2013 1001   UROBILINOGEN 0.2 10/14/2018 1143   NITRITE NEGATIVE 10/14/2018 1143   LEUKOCYTESUR NEGATIVE 10/14/2018 1143    Radiological Exams on Admission: Dg Chest 2 View  Result Date: 11/02/2018 CLINICAL DATA:  Cough and fever EXAM: CHEST - 2 VIEW COMPARISON:  October 04, 2018 FINDINGS: There is airspace consolidation in the left lower lobe with small left pleural effusion. There is mild right base atelectasis. Heart is mildly enlarged with pulmonary vascularity normal. No adenopathy. There is aortic atherosclerosis. No bone lesions. IMPRESSION: Airspace consolidation consistent with pneumonia in the left lower lobe with small left pleural effusion. There is mild right base atelectasis. There is mild cardiomegaly. Aortic Atherosclerosis (ICD10-I70.0). Followup PA and lateral chest radiographs recommended in 3-4 weeks following trial of antibiotic therapy to ensure resolution and exclude underlying malignancy. Electronically Signed   By: Lowella Grip III M.D.   On: 11/02/2018 14:54    EKG: Independently reviewed.  Sinus rhythm.  Right bundle branch block and that was present on previous EKGs on comparison.  Assessment/Plan Principal Problem:   Community acquired pneumonia Active Problems:   Postherpetic polyneuropathy   Vitamin D deficiency   Hyperlipidemia   Essential hypertension   Coronary atherosclerosis   COPD (chronic obstructive pulmonary disease) (HCC)   Dementia, Alzheimer's, with behavior disturbance (HCC)   Pneumonia     1.  Community-acquired pneumonia, also suspect post viral pneumonia: Failed outpatient therapy. Agree with admission given severity of symptoms. Antibiotics to treat bacterial pneumonia.  We will continue Rocephin and doxycycline because of history of flulike symptoms. Chest physiotherapy, incentive spirometry, deep breathing exercises, sputum induction, mucolytic's and  bronchodilators. Sputum cultures, blood cultures, Legionella and streptococcal antigen. Supplemental oxygen to keep saturations more than 90%. Speech evaluation to rule out aspiration pneumonia because of pneumonia of the lower lobes.  2.  Hypertension: Blood pressures are stable.  He will resume home medications.  3.  History of coronary artery disease: Denies any chest pain.  Patient is on aspirin, statin and beta-blockers that he will continue.  4.  COPD with no exacerbation: Bronchodilator  as needed as per #1.  5.  Dementia: Lives at home with family.  Has good family support.    DVT prophylaxis: Lovenox subcu. Code Status: Full code Family Communication: Daughter at the bedside Disposition Plan: Home with home care.  Anticipate 48 -72 hours. Consults called: None. Admission status: Inpatient.   Barb Merino MD [facility] Pager [phone]  If 7PM-7AM, please contact night-coverage [email or web address] Password [email or web address]  11/02/2018, 4:00 PM

## 2018-11-03 ENCOUNTER — Other Ambulatory Visit: Payer: Self-pay

## 2018-11-03 DIAGNOSIS — I10 Essential (primary) hypertension: Secondary | ICD-10-CM

## 2018-11-03 DIAGNOSIS — D696 Thrombocytopenia, unspecified: Secondary | ICD-10-CM

## 2018-11-03 DIAGNOSIS — E559 Vitamin D deficiency, unspecified: Secondary | ICD-10-CM

## 2018-11-03 DIAGNOSIS — J13 Pneumonia due to Streptococcus pneumoniae: Principal | ICD-10-CM

## 2018-11-03 LAB — CBC WITH DIFFERENTIAL/PLATELET
Abs Immature Granulocytes: 0.06 10*3/uL (ref 0.00–0.07)
BASOS ABS: 0 10*3/uL (ref 0.0–0.1)
BASOS PCT: 0 %
Eosinophils Absolute: 0.1 10*3/uL (ref 0.0–0.5)
Eosinophils Relative: 1 %
HCT: 45 % (ref 39.0–52.0)
Hemoglobin: 13.9 g/dL (ref 13.0–17.0)
Immature Granulocytes: 1 %
Lymphocytes Relative: 5 %
Lymphs Abs: 0.7 10*3/uL (ref 0.7–4.0)
MCH: 25.8 pg — ABNORMAL LOW (ref 26.0–34.0)
MCHC: 30.9 g/dL (ref 30.0–36.0)
MCV: 83.5 fL (ref 80.0–100.0)
Monocytes Absolute: 0.3 10*3/uL (ref 0.1–1.0)
Monocytes Relative: 2 %
NRBC: 0 % (ref 0.0–0.2)
Neutro Abs: 12.2 10*3/uL — ABNORMAL HIGH (ref 1.7–7.7)
Neutrophils Relative %: 91 %
Platelets: 147 10*3/uL — ABNORMAL LOW (ref 150–400)
RBC: 5.39 MIL/uL (ref 4.22–5.81)
RDW: 15.5 % (ref 11.5–15.5)
WBC: 13.3 10*3/uL — ABNORMAL HIGH (ref 4.0–10.5)

## 2018-11-03 LAB — COMPREHENSIVE METABOLIC PANEL
ALBUMIN: 2.9 g/dL — AB (ref 3.5–5.0)
ALT: 11 U/L (ref 0–44)
AST: 18 U/L (ref 15–41)
Alkaline Phosphatase: 70 U/L (ref 38–126)
Anion gap: 11 (ref 5–15)
BUN: 18 mg/dL (ref 8–23)
CO2: 25 mmol/L (ref 22–32)
Calcium: 9 mg/dL (ref 8.9–10.3)
Chloride: 102 mmol/L (ref 98–111)
Creatinine, Ser: 1.28 mg/dL — ABNORMAL HIGH (ref 0.61–1.24)
GFR calc Af Amer: >60 mL/min (ref >60)
GFR calc non Af Amer: 52 mL/min — ABNORMAL LOW (ref >60)
Glucose, Bld: 114 mg/dL — ABNORMAL HIGH (ref 70–99)
Potassium: 4 mmol/L (ref 3.5–5.1)
SODIUM: 138 mmol/L (ref 135–145)
Total Bilirubin: 1.4 mg/dL — ABNORMAL HIGH (ref 0.3–1.2)
Total Protein: 6.5 g/dL (ref 6.5–8.1)

## 2018-11-03 LAB — EXPECTORATED SPUTUM ASSESSMENT W GRAM STAIN, RFLX TO RESP C

## 2018-11-03 LAB — MAGNESIUM: Magnesium: 1.8 mg/dL (ref 1.7–2.4)

## 2018-11-03 LAB — PHOSPHORUS: Phosphorus: 3 mg/dL (ref 2.5–4.6)

## 2018-11-03 MED ORDER — GUAIFENESIN ER 600 MG PO TB12
1200.0000 mg | ORAL_TABLET | Freq: Two times a day (BID) | ORAL | Status: DC
  Administered 2018-11-03 – 2018-11-05 (×5): 1200 mg via ORAL
  Filled 2018-11-03 (×5): qty 2

## 2018-11-03 MED ORDER — IPRATROPIUM-ALBUTEROL 0.5-2.5 (3) MG/3ML IN SOLN
3.0000 mL | Freq: Four times a day (QID) | RESPIRATORY_TRACT | Status: DC
  Administered 2018-11-03 (×2): 3 mL via RESPIRATORY_TRACT
  Filled 2018-11-03 (×2): qty 3

## 2018-11-03 NOTE — Evaluation (Signed)
Physical Therapy Evaluation Patient Details Name: Matthew Gray MRN: [id number] DOB: 1934/02/02 Today's Date: 11/03/2018   History of Present Illness  Matthew Gray is a 85 y.o. male with medical history significant of dementia, COPD, asthma, myalgia, coronary artery disease, hyperlipidemia, attends adult daycare who presents to the emergency room with fever and cough. Chest x-ray shows left lower lobe pneumonia.     Clinical Impression  Pt admitted with above diagnosis. Pt currently with functional limitations due to the deficits listed below (see PT Problem List). Patient unreliable historian reports he lives at home with his brother but hasn't seen him in a while. Per chart lives with daughter. Today ambulating without AD but min guard due to imbalance noted. De sat to 85% on RA with 120' of walking. Returns quickly with rest.  Pt will benefit from skilled PT to increase their independence and safety with mobility to allow discharge to the venue listed below.       Follow Up Recommendations Home health PT    Equipment Recommendations  None recommended by PT    Recommendations for Other Services       Precautions / Restrictions Precautions Precautions: Fall Restrictions Weight Bearing Restrictions: No      Mobility  Bed Mobility               General bed mobility comments: pt sitting in recliner upon arrival  Transfers Overall transfer level: Needs assistance   Transfers: Sit to/from Stand Sit to Stand: Min assist         General transfer comment: minA for stability with initial stand;minor LOB noted, pt able to self-correct, pt's daughter reports this is atypical for pt  Ambulation/Gait Ambulation/Gait assistance: Min guard Gait Distance (Feet): 125 Feet   Gait Pattern/deviations: Step-to pattern Gait velocity: decreased   General Gait Details: mild unsteadiness, guarding for safety. de sat to 86%   Science writer    Modified  [place] (Stroke Patients Only)       Balance Overall balance assessment: Mild deficits observed, not formally tested                                           Pertinent Vitals/Pain Pain Assessment: No/denies pain    Home Living Family/patient expects to be discharged to:: Private residence Living Arrangements: Children;Other (Comment) Available Help at Discharge: Available 24 hours/day Type of Home: House Home Access: Level entry     Home Layout: One level   Additional Comments: pt's daughter works 2 jobs, but reports pt is never left alone    Prior Function Level of Independence: Needs assistance   Gait / Transfers Assistance Needed: pt was independent with mobility   ADL's / Homemaking Assistance Needed: pt required assistance from his daughter for all ADL/IADL   Comments: pt attended adult day care daily and has caregiver come from 4pm-9pm on weekdays while pt's daughter is at work;     Journalist, newspaper        Extremity/Trunk Assessment   Upper Extremity Assessment Upper Extremity Assessment: Generalized weakness    Lower Extremity Assessment Lower Extremity Assessment: Overall WFL for tasks assessed    Cervical / Trunk Assessment Cervical / Trunk Assessment: Normal  Communication   Communication: No difficulties  Cognition Arousal/Alertness: Awake/alert Behavior During Therapy: WFL for tasks assessed/performed Overall Cognitive Status:  History of cognitive impairments - at baseline                                        General Comments      Exercises     Assessment/Plan    PT Assessment Patient needs continued PT services  PT Problem List Decreased strength       PT Treatment Interventions      PT Goals (Current goals can be found in the Care Plan section)  Acute Rehab PT Goals Patient Stated Goal: pt did not state goal PT Goal Formulation: With patient Potential to Achieve Goals: Good    Frequency Min  3X/week   Barriers to discharge        Co-evaluation               AM-PAC PT "6 Clicks" Mobility  Outcome Measure Help needed turning from your back to your side while in a flat bed without using bedrails?: None Help needed moving from lying on your back to sitting on the side of a flat bed without using bedrails?: None Help needed moving to and from a bed to a chair (including a wheelchair)?: A Little Help needed standing up from a chair using your arms (e.g., wheelchair or bedside chair)?: A Little Help needed to walk in hospital room?: A Little Help needed climbing 3-5 steps with a railing? : A Little 6 Click Score: 20    End of Session Equipment Utilized During Treatment: Gait belt Activity Tolerance: Patient tolerated treatment well Patient left: in bed Nurse Communication: Mobility status PT Visit Diagnosis: Unsteadiness on feet (R26.81)    Time: 4975-3005 PT Time Calculation (min) (ACUTE ONLY): 25 min   Charges:   PT Evaluation $PT Eval Low Complexity: 1 Low PT Treatments $Gait Training: 8-22 mins        Reinaldo Berber, PT, DPT Acute Rehabilitation Services Pager: [phone] Office: [phone]    Reinaldo Berber 11/03/2018, 6:28 PM

## 2018-11-03 NOTE — Progress Notes (Signed)
Occupational Therapy Evaluation Patient Details Name: Matthew Gray MRN: [id number] DOB: 07-12-1934 Today's Date: 11/03/2018    History of Present Illness Matthew Gray is a 85 y.o. male with medical history significant of dementia, COPD, asthma, myalgia, coronary artery disease, hyperlipidemia, attends adult daycare who presents to the emergency room with fever and cough. Chest x-ray shows left lower lobe pneumonia.    Clinical Impression   PTA, pt was living at home with his daughter, who assisted pt with ADL/IADL and pt was independent with functional mobility. Pts daughter present throughout session and provided pt's history. [name] daughter reports pt attends an Adult Day Care and pt has a caregiver M-F from 4pm-9pm while daughter is at work. [name] daughter reports pt is currently functioning at baseline requiring minA for ADL. Pt requiring minA for initial sit<>stand with minor instability noted. Anticipate pt will continue to progress given current level of support available at home. No additional OT needs identified, family with no additional questions, pt to d/c home when medically stable. OT to sign off. Thank you for referral.     Follow Up Recommendations  No OT follow up;Supervision/Assistance - 24 hour    Equipment Recommendations       Recommendations for Other Services PT consult     Precautions / Restrictions Precautions Precautions: Fall Restrictions Weight Bearing Restrictions: No      Mobility Bed Mobility               General bed mobility comments: pt sitting in recliner upon arrival  Transfers Overall transfer level: Needs assistance   Transfers: Sit to/from Stand Sit to Stand: Min assist         General transfer comment: minA for stability with initial stand;minor LOB noted, pt able to self-correct, pt's daughter reports this is atypical for pt    Balance Overall balance assessment: Mild deficits observed, not formally tested                                          ADL either performed or assessed with clinical judgement   ADL Overall ADL's : At baseline                                       General ADL Comments: daughter reports pt is functioning at baseline     Vision Baseline Vision/History: No visual deficits Patient Visual Report: No change from baseline       Perception     Praxis      Pertinent Vitals/Pain Pain Assessment: No/denies pain     Hand Dominance Right   Extremity/Trunk Assessment Upper Extremity Assessment Upper Extremity Assessment: Generalized weakness   Lower Extremity Assessment Lower Extremity Assessment: Defer to PT evaluation   Cervical / Trunk Assessment Cervical / Trunk Assessment: Normal   Communication Communication Communication: No difficulties   Cognition Arousal/Alertness: Awake/alert Behavior During Therapy: WFL for tasks assessed/performed Overall Cognitive Status: History of cognitive impairments - at baseline                                     General Comments  pt's daughter present throughout session    Exercises     Shoulder Instructions      Home  Living Family/patient expects to be discharged to:: Private residence Living Arrangements: Children;Other (Comment) Available Help at Discharge: Available 24 hours/day Type of Home: House Home Access: Level entry     Home Layout: One level     Bathroom Shower/Tub: Teacher, early years/pre: Standard Bathroom Accessibility: Yes How Accessible: Accessible via walker     Additional Comments: pt's daughter works 2 jobs, but reports pt is never left alone      Prior Functioning/Environment Level of Independence: Needs assistance  Gait / Transfers Assistance Needed: pt was independent with mobility  ADL's / Homemaking Assistance Needed: pt required assistance from his daughter for all ADL/IADL    Comments: pt attended adult day care daily and has  caregiver come from 4pm-9pm on weekdays while pt's daughter is at work;        OT Problem List: Decreased activity tolerance;Impaired balance (sitting and/or standing);Decreased cognition;Decreased safety awareness;Cardiopulmonary status limiting activity      OT Treatment/Interventions:      OT Goals(Current goals can be found in the care plan section) Acute Rehab OT Goals Patient Stated Goal: pt did not state goal OT Goal Formulation: With patient Time For Goal Achievement: 11/17/18 Potential to Achieve Goals: Good  OT Frequency:     Barriers to D/C:            Co-evaluation              AM-PAC OT "6 Clicks" Daily Activity     Outcome Measure Help from another person eating meals?: None Help from another person taking care of personal grooming?: A Little Help from another person toileting, which includes using toliet, bedpan, or urinal?: A Little Help from another person bathing (including washing, rinsing, drying)?: A Little Help from another person to put on and taking off regular upper body clothing?: A Little Help from another person to put on and taking off regular lower body clothing?: A Little 6 Click Score: 19   End of Session Equipment Utilized During Treatment: Gait belt Nurse Communication: Mobility status  Activity Tolerance: Patient tolerated treatment well Patient left: in chair;with call bell/phone within reach;with chair alarm set;with family/visitor present  OT Visit Diagnosis: Unsteadiness on feet (R26.81);Other abnormalities of gait and mobility (R26.89);Muscle weakness (generalized) (M62.81);Other symptoms and signs involving cognitive function                Time: 1301-1320 OT Time Calculation (min): 19 min Charges:  OT General Charges $OT Visit: 1 Visit OT Evaluation $OT Eval Moderate Complexity: [place] OTR/L Acute Rehabilitation Services Office: [place] 11/03/2018, 2:06 PM

## 2018-11-03 NOTE — Evaluation (Addendum)
Clinical/Bedside Swallow Evaluation Patient Details  Name: Matthew Gray MRN: [id number] Date of Birth: March 08, 1934  Today's Date: 11/03/2018 Time: SLP Start Time (ACUTE ONLY): 1030 SLP Stop Time (ACUTE ONLY): 1045 SLP Time Calculation (min) (ACUTE ONLY): 15 min  Past Medical History:  Past Medical History:  Diagnosis Date  . ALLERGIC RHINITIS 10/27/2007  . ANKLE PAIN, RIGHT 11/30/2009  . ASTHMA 10/27/2007  . BACK PAIN 10/08/2009  . BENIGN PROSTATIC HYPERTROPHY 10/27/2007  . CHRONIC OBSTRUCTIVE PULMONARY DISEASE, ACUTE EXACERBATION 11/22/2007  . COLONIC POLYPS, HX OF 10/27/2007  . COPD 10/27/2007  . CORONARY ARTERY DISEASE 10/27/2007  . Dementia ([place]) 01/13/2013  . DEPRESSION 11/30/2009  . FATIGUE 11/30/2009  . Hemoptysis 04/24/2009  . HYPERLIPIDEMIA 04/03/2007  . HYPERTENSION 04/03/2007  . MYALGIA 02/04/2008  . Obstructive chronic bronchitis without exacerbation ([place])   . Pain in Soft Tissues of Limb 02/04/2008  . PARESTHESIA 09/02/2010  . PERIPHERAL EDEMA 11/16/2009  . Peripheral neuropathy 03/08/2012  . PERIPHERAL VASCULAR DISEASE 10/27/2007  . 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 RASH-NONVESICULAR 10/27/2007  . SCIATICA, LEFT 11/08/2009  . SHOULDER PAIN, LEFT 06/21/2009  . SINUSITIS- ACUTE-NOS 10/08/2009  . VITAMIN D DEFICIENCY 06/04/2010   Past Surgical History:  Past Surgical History:  Procedure Laterality Date  . None     HPI:  Matthew Gray is a 85 y.o. male with medical history significant of dementia, COPD, asthma, myalgia, coronary artery disease, hyperlipidemia, attends adult daycare who presents to the emergency room with fever and cough. Chest x-ray shows left lower lobe pneumonia.   Assessment / Plan / Recommendation Clinical Impression  [name] oral motor exam was unremarkable; cough is strong with normal vocal quality. He denied prior difficulty when eating or drinking. Suspected airway compromise indicated by consistent cough either immediately or shortly after swallows of thin and puree.  Facial grimace versus effortful swallow with pt denying odonophagia or globus sensation. He was unable to consume 3 oz consecutively. Intermittent wet vocal quality. Instrumental evaluation recommended with MBS (potential UES or esophageal component?); radiology unable to accomodate MBS at present. Will plan for MBS tomorrow morning- continue regular texture, thin and educated pt to take small sips/bites and consume po's at a slow rate.     SLP Visit Diagnosis: Dysphagia, unspecified (R13.10)    Aspiration Risk  Mild aspiration risk;Moderate aspiration risk    Diet Recommendation Regular;Thin liquid   Liquid Administration via: Cup;Straw Medication Administration: Whole meds with puree Supervision: Patient able to self feed;Intermittent supervision to cue for compensatory strategies Compensations: Slow rate;Small sips/bites;Minimize environmental distractions Postural Changes: Seated upright at 90 degrees    Other  Recommendations Oral Care Recommendations: Oral care BID   Follow up Recommendations (TBA)      Frequency and Duration min 2x/week  2 weeks       Prognosis Prognosis for Safe Diet Advancement: (fair-good) Barriers to Reach Goals: Cognitive deficits      Swallow Study   General HPI: Matthew Gray is a 85 y.o. male with medical history significant of dementia, COPD, asthma, myalgia, coronary artery disease, hyperlipidemia, attends adult daycare who presents to the emergency room with fever and cough. Chest x-ray shows left lower lobe pneumonia. Type of Study: Bedside Swallow Evaluation Previous Swallow Assessment: (none) Diet Prior to this Study: Regular;Thin liquids Temperature Spikes Noted: No Respiratory Status: Room air History of Recent Intubation: No Behavior/Cognition: Alert;Cooperative;Pleasant mood Oral Cavity Assessment: Within Functional Limits Oral Care Completed by SLP: No Oral Cavity - Dentition: Dentures, top(lower denture plate vs natural?) Vision:  Functional for self-feeding Self-Feeding Abilities: Able to feed self Patient Positioning: Upright in chair Baseline Vocal Quality: Normal Volitional Cough: Strong Volitional Swallow: Able to elicit    Oral/Motor/Sensory Function Overall Oral Motor/Sensory Function: Within functional limits   Ice Chips Ice chips: Not tested   Thin Liquid Thin Liquid: Impaired Presentation: Cup;Straw Pharyngeal  Phase Impairments: Cough - Immediate    Nectar Thick Nectar Thick Liquid: Not tested   Honey Thick Honey Thick Liquid: Not tested   Puree Puree: Impaired Pharyngeal Phase Impairments: Cough - Immediate   Solid     Solid: Within functional limits      Matthew Gray 11/03/2018,10:58 AM  Matthew Gray Matthew Gray.Ed Risk analyst [phone] Office [phone]

## 2018-11-03 NOTE — Progress Notes (Signed)
PROGRESS NOTE    Matthew Gray  PJA:[id number] DOB: May 05, 1934 DOA: 11/02/2018 PCP: Biagio Borg, MD   Brief Narrative:  HPI per Dr. Barb Merino on 11/02/2018 Matthew Gray is a 85 y.o. male with medical history significant of dementia, COPD, coronary artery disease, hyperlipidemia who presents to the emergency room with his daughter with fever and cough.  Patient is poor historian probably due to underlying cognitive dysfunction.  History is supplemented by daughter at the bedside.  According to her, he developed some cough and congestion about 3 weeks ago and was treated with Tamiflu.  After about a week he had another bout of cough and was given antibiotics.  He has been quite lethargic and coughing all throughout.  Last night her daughter noted him having shaking and chills.  He went to his adult daycare today, his temperature was 101 so was sent to the hospital.  Patient himself complains of cough with mucoid sputum and occasionally yellow-tinged on it.  He denies any chest pain.  Wheezing present but no shortness of breath.  Denies any nausea vomiting.  Urine and bowel habits are normal.  **Interim History The patient states he was doing ok and wanting to go home. Surprised he has a Pneumonia. States he is not able to cough up much sputum.   Assessment & Plan:   Principal Problem:   Community acquired pneumonia Active Problems:   Postherpetic polyneuropathy   Vitamin D deficiency   Hyperlipidemia   Essential hypertension   Coronary atherosclerosis   COPD (chronic obstructive pulmonary disease) (HCC)   Dementia, Alzheimer's, with behavior disturbance (HCC)   Pneumonia  Strep Pneumo Community-acquired Pneumonia   -Failed outpatient therapy. -Was admitted due to Severity of the Sumptoms -C/w Antibiotics to treat bacterial pneumonia.  We will continue Rocephin and doxycycline because of history of flulike symptoms. -Influenza A and B via PCR was negative -Chest physiotherapy,  incentive spirometry, deep breathing exercises, sputum induction, mucolytic's and bronchodilators. -Added DuoNeb q6h and will C/w Albuterol 2.5 mg Neb q2hprn -Checked Sputum cultures and Gram Stain showed RARE WBC PRESENT, PREDOMINANTLY PMN  FEW GRAM POSITIVE COCCI IN PAIRS  FEW GRAM NEGATIVE RODS  FEW GRAM POSITIVE RODS  With Cx pending -Blood cultures (NGTD < 24 hours), Legionella pending and streptococcal Pneumo antigen was POSITIVE. -LA went from 1.5 -> 1.2 -Added Mucinex 1200 mg po BID -WBC worsened and went from 8.3 -> 11.7 -> 13.3 -C/w Supplemental oxygen to keep saturations more than 90%. -Speech evaluation to rule out aspiration pneumonia because of pneumonia of the lower lobes and patient is a low aspiration risk and was placed on a Regular Diet with Thin Liquids  -Repeat CXR in AM  Hypertension -Blood pressures was stable and slightly elevated at 134/95 this AM.   -Resume home medications and will continue Amlodipine 5 mg po Daily -Continue to Monitor Blood Pressures Carefully  History of Coronary Artery Disease -Denies any chest pain.   -Patient is on Aspirin 81 mg po Daily -Per MAR Review not on any Statin or BB  COPD with slight Exacerbation -See as above for Nebs -Added Flutter Valve, Incentive Spirometry and Guaifenesin 1200 mg po BID -If significantly wheezing will add IV Steroids   Dementia -Lives at home with family.  Has good family support. -C/w Donepezil 10 mg po Daily along with Quetiapine 50 mg po Daily and 75 mg po qHS -Delirium Precautions  HLD -Last Lipid Panel on 2/20 showed cholesterol HDL ratio 4, cholesterol level of 207,  HDL of 53.4, LDL 135, non-HDL 153.59, triglycerides of 93.0 and VLDL of 18.6 -Currently not on any statins will continue to monitor  Hyperbilirubinemia -Mild at 1.4 -Likely reactive in the setting of infection -Continue to monitor and repeat CMP in a.m.  Mildly elevated creatinine/AKI on CKD stage II -Baseline creatinine  ranges from 2.9-1.1 -Slightly elevated and is 1.28 today -Continue monitor and avoid nephrotoxic medications as well as contrast dyes if possible -Repeat CMP in the a.m.  Hyperglycemia -Patient's Glucose ranging from 104-114 on Daily CMP's -Check HbA1c -Continue to Monitor Blood Sugars Carefully and if consistently elevated will place on Sensitive Novolog SSI  Thrombocytopenia -Mild at 147K and was 180K on admission -In the setting of Pneumonia -Continue to Monitor for S/Sx of Bleeding -Repeat CBC in AM   DVT prophylaxis: Enoxaparin 40 mg sq q24h Code Status: FULL CODE Family Communication: Discussed with Daughter at bedside Disposition Plan: Pending Clinical Improvement back to baseline  Consultants:   None   Procedures:  None   Antimicrobials:  Anti-infectives (From admission, onward)   Start     Dose/Rate Route Frequency Ordered Stop   11/02/18 2200  doxycycline (VIBRA-TABS) tablet 100 mg     100 mg Oral Every 12 hours 11/02/18 1739 11/09/18 2159   11/02/18 1830  cefTRIAXone (ROCEPHIN) 1 g in sodium chloride 0.9 % 100 mL IVPB     1 g 200 mL/hr over 30 Minutes Intravenous Every 24 hours 11/02/18 1739 11/09/18 1829   11/02/18 1415  doxycycline (VIBRAMYCIN) 100 mg in sodium chloride 0.9 % 250 mL IVPB     100 mg 125 mL/hr over 120 Minutes Intravenous  Once 11/02/18 1407 11/02/18 1715     Subjective: Seen and examined at bedside he is pleasantly demented and was surprised that he had a pneumonia.  Denies any shortness of breath or chest pain currently and states he is not able to cough up much sputum.  No nausea or vomiting.  No other concerns or complaints at this time.  Objective: Vitals:   11/02/18 1615 11/02/18 1713 11/02/18 2339 11/03/18 0716  BP: 125/85 (!) 147/89 130/83 (!) 134/95  Pulse: 94 96 93 89  Resp: (!) 21 20 20 16   Temp:  (!) 100.5 F (38.1 C) 98.9 F (37.2 C) 98.6 F (37 C)  TempSrc:  Oral Oral Oral  SpO2: 92% 95% 95% 100%    Intake/Output  Summary (Last 24 hours) at 11/03/2018 1219 Last data filed at 11/03/2018 0636 Gross per 24 hour  Intake 100 ml  Output 575 ml  Net -475 ml   There were no vitals filed for this visit.  Examination: Physical Exam:  Constitutional: WN/WD elderly AAM in NAD and appears calm and comfortable and is pleasantly demented  Eyes: Lids and conjunctivae normal, sclerae anicteric  ENMT: External Ears, Nose appear normal. Grossly normal hearing.  Neck: Appears normal, supple, no cervical masses, normal ROM, no appreciable thyromegaly Respiratory: Diminished to auscultation bilaterally with some mild wheezing and rhonchi; No appreciable rales or crackles. Normal respiratory effort and patient is not tachypenic. No accessory muscle use.  Cardiovascular: RRR, Slight 2/6 murmur. S1 and S2 auscultated. No extremity edema.  Abdomen: Soft, non-tender, Distended slightly. No masses palpated. No appreciable hepatosplenomegaly. Bowel sounds positive x4.  GU: Deferred. Musculoskeletal: No clubbing / cyanosis of digits/nails. No joint deformity upper and lower extremities. Skin: No rashes, lesions, ulcers on a limited skin evaluation. No induration; Warm and dry.  Neurologic: CN 2-12 grossly intact with no focal  deficits. Romberg sign and cerebellar reflexes not assessed.  Psychiatric: Impaired judgment and insight. Alert and oriented x1. Normal mood and appropriate affect.   Data Reviewed: I have personally reviewed following labs and imaging studies  CBC: Recent Labs  Lab 11/02/18 1318 11/03/18 0932  WBC 11.7* 13.3*  NEUTROABS 11.1* 12.2*  HGB 14.7 13.9  HCT 47.1 45.0  MCV 83.1 83.5  PLT 180 379*   Basic Metabolic Panel: Recent Labs  Lab 11/02/18 1318 11/03/18 0932  NA 137 138  K 4.4 4.0  CL 100 102  CO2 28 25  GLUCOSE 104* 114*  BUN 19 18  CREATININE 1.23 1.28*  CALCIUM 9.1 9.0  MG  --  1.8  PHOS  --  3.0   GFR: CrCl cannot be calculated (Unknown ideal weight.). Liver Function Tests:  Recent Labs  Lab 11/02/18 1318 11/03/18 0932  AST 21 18  ALT 14 11  ALKPHOS 74 70  BILITOT 1.1 1.4*  PROT 7.0 6.5  ALBUMIN 3.3* 2.9*   No results for input(s): LIPASE, AMYLASE in the last 168 hours. No results for input(s): AMMONIA in the last 168 hours. Coagulation Profile: Recent Labs  Lab 11/02/18 1318  INR 1.0   Cardiac Enzymes: No results for input(s): CKTOTAL, CKMB, CKMBINDEX, TROPONINI in the last 168 hours. BNP (last 3 results) No results for input(s): PROBNP in the last 8760 hours. HbA1C: No results for input(s): HGBA1C in the last 72 hours. CBG: No results for input(s): GLUCAP in the last 168 hours. Lipid Profile: No results for input(s): CHOL, HDL, LDLCALC, TRIG, CHOLHDL, LDLDIRECT in the last 72 hours. Thyroid Function Tests: No results for input(s): TSH, T4TOTAL, FREET4, T3FREE, THYROIDAB in the last 72 hours. Anemia Panel: No results for input(s): VITAMINB12, FOLATE, FERRITIN, TIBC, IRON, RETICCTPCT in the last 72 hours. Sepsis Labs: Recent Labs  Lab 11/02/18 1318 11/02/18 1752  LATICACIDVEN 1.5 1.2    Recent Results (from the past 240 hour(s))  Culture, blood (Routine x 2)     Status: None (Preliminary result)   Collection Time: 11/02/18  1:20 PM  Result Value Ref Range Status   Specimen Description BLOOD LEFT ARM  Final   Special Requests   Final    BOTTLES DRAWN AEROBIC AND ANAEROBIC Blood Culture adequate volume   Culture   Final    NO GROWTH < 24 HOURS Performed at [facility], [place] [address], [place], [place] [address]    Report Status PENDING  Incomplete  Culture, blood (Routine x 2)     Status: None (Preliminary result)   Collection Time: 11/02/18  1:55 PM  Result Value Ref Range Status   Specimen Description BLOOD RIGHT FOREARM  Final   Special Requests   Final    BOTTLES DRAWN AEROBIC AND ANAEROBIC Blood Culture adequate volume   Culture   Final    NO GROWTH < 24 HOURS Performed at [facility], [place] [address]  [place], [place] [address]    Report Status PENDING  Incomplete  Culture, sputum-assessment     Status: None   Collection Time: 11/03/18  5:27 AM  Result Value Ref Range Status   Specimen Description SPUTUM  Final   Special Requests NONE  Final   Sputum evaluation   Final    THIS SPECIMEN IS ACCEPTABLE FOR SPUTUM CULTURE Performed at [facility], [address], [place], [place] [address]    Report Status 11/03/2018 FINAL  Final  Culture, respiratory  Status: None (Preliminary result)   Collection Time: 11/03/18  5:27 AM  Result Value Ref Range Status   Specimen Description SPUTUM  Final   Special Requests NONE Reflexed from [id number]  Final   Gram Stain   Final    RARE WBC PRESENT, PREDOMINANTLY PMN FEW GRAM POSITIVE COCCI IN PAIRS FEW GRAM NEGATIVE RODS FEW GRAM POSITIVE RODS Performed at [facility], [place] [address] [place],  [address]    Culture PENDING  Incomplete   Report Status PENDING  Incomplete    Radiology Studies: Dg Chest 2 View  Result Date: 11/02/2018 CLINICAL DATA:  Cough and fever EXAM: CHEST - 2 VIEW COMPARISON:  October 04, 2018 FINDINGS: There is airspace consolidation in the left lower lobe with small left pleural effusion. There is mild right base atelectasis. Heart is mildly enlarged with pulmonary vascularity normal. No adenopathy. There is aortic atherosclerosis. No bone lesions. IMPRESSION: Airspace consolidation consistent with pneumonia in the left lower lobe with small left pleural effusion. There is mild right base atelectasis. There is mild cardiomegaly. Aortic Atherosclerosis (ICD10-I70.0). Followup PA and lateral chest radiographs recommended in 3-4 weeks following trial of antibiotic therapy to ensure resolution and exclude underlying malignancy. Electronically Signed   By: Lowella Grip III M.D.   On: 11/02/2018 14:54   Scheduled Meds: . acetaminophen  1,000 mg Oral Once  . amLODipine  5 mg Oral Daily  . aspirin EC  81 mg Oral  Daily  . donepezil  10 mg Oral Q0200  . doxycycline  100 mg Oral Q12H  . enoxaparin (LOVENOX) injection  40 mg Subcutaneous Q24H  . guaiFENesin  1,200 mg Oral BID  . ipratropium-albuterol  3 mL Nebulization Q6H  . multivitamin with minerals  1 tablet Oral Daily  . QUEtiapine  50 mg Oral Daily  . QUEtiapine  75 mg Oral QHS   Continuous Infusions: . cefTRIAXone (ROCEPHIN)  IV Stopped (11/02/18 2046)    LOS: 1 day   Kerney Elbe, DO [facility] PAGER is on [place]  If 7PM-7AM, please contact night-coverage [email or web address] Password [facility] 11/03/2018, 12:19 PM

## 2018-11-03 NOTE — TOC Initial Note (Addendum)
Transition of Care [facility]) - Initial/Assessment Note    Patient Details  Name: Matthew Gray MRN: [id number] Date of Birth: Jun 05, 1934  Transition of Care [facility]) CM/SW Contact:    Carles Collet, RN Phone Number: 11/03/2018, 3:03 PM  Clinical Narrative:                  3/11 1503 Spoke w patient and his daughter at bedside. Patient lives at home with his daughter. She works during the day and he goes to [employer] 5 days a week for 8 ish hours a day. Patient does not currently endorse using any [employer] equipment, including O2. Patient has coverage through [facility] and denies  Difficulties obtaining meds or getting to MD.  3/12 1456 Attempt to contact daughter by phone to discuss [facility]. PT rec HH PT, discussed w MD adding SLP as well. Unable to leave voicemail. Filbert Berthold Daughter [phone]    NEEDS HH PT SLP ORDER AND REFERRAL.   Expected Discharge Plan: Home/Self Care     Patient Goals and CMS Choice        Expected Discharge Plan and Services Expected Discharge Plan: Home/Self Care       Expected Discharge Date: 11/05/18                        Prior Living Arrangements/Services                       Activities of Daily Living Home Assistive Devices/Equipment: None ADL Screening (condition at time of admission) Patient's cognitive ability adequate to safely complete daily activities?: Yes Is the patient deaf or have difficulty hearing?: No Does the patient have difficulty seeing, even when wearing glasses/contacts?: Yes Does the patient have difficulty concentrating, remembering, or making decisions?: Yes Patient able to express need for assistance with ADLs?: No Does the patient have difficulty dressing or bathing?: Yes Independently performs ADLs?: No Communication: Independent Dressing (OT): Dependent Is this a change from baseline?: Pre-admission baseline Grooming: Dependent Is this a change from baseline?: Pre-admission  baseline Feeding: Independent Bathing: Dependent Is this a change from baseline?: Pre-admission baseline Toileting: Independent In/Out Bed: Independent Walks in Home: Independent Does the patient have difficulty walking or climbing stairs?: No Weakness of Legs: None Weakness of Arms/Hands: None  Permission Sought/Granted                  Emotional Assessment              Admission diagnosis:  Sepsis, due to unspecified organism, unspecified whether acute organ dysfunction present [facility]) [A41.9] Patient Active Problem List   Diagnosis Date Noted  . Community acquired pneumonia 11/02/2018  . Pneumonia 11/02/2018  . COPD exacerbation ([place]) 10/14/2018  . Influenza-like illness 10/04/2018  . Urinary incontinence 07/19/2018  . Fatigue 10/22/2017  . Hypokalemia 07/29/2017  . Driving safety issue 04/21/2017  . Acute right ankle pain 08/16/2016  . Bilateral hand pain 03/30/2016  . Bilateral hip pain 03/27/2016  . Loose stools 03/27/2016  . Low back pain 11/13/2015  . Cough 02/06/2015  . Weakness 07/19/2014  . Right inguinal hernia 03/04/2013  . Dementia, Alzheimer's, with behavior disturbance ([place]) 01/13/2013  . Obstructive chronic bronchitis without exacerbation ([place])   . Polyneuropathy in other diseases classified elsewhere ([place])   . PVD (peripheral vascular disease) ([place]) 05/04/2012  . Peripheral neuropathy 03/08/2012  . Bilateral leg pain 03/08/2012  . Peripheral edema 09/10/2011  . Preventative health care  11/22/2010  . Weight loss 11/22/2010  . Anxiety 11/22/2010  . PARESTHESIA 09/02/2010  . Vitamin D deficiency 06/04/2010  . DEPRESSION 11/30/2009  . ANKLE PAIN, RIGHT 11/30/2009  . FATIGUE 11/30/2009  . PERIPHERAL EDEMA 11/16/2009  . SCIATICA, LEFT 11/08/2009  . Backache 10/08/2009  . SHOULDER PAIN, LEFT 06/21/2009  . Hemoptysis 04/24/2009  . 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 MYALGIA 02/04/2008  . Pain in Soft Tissues of Limb 02/04/2008  .  Coronary atherosclerosis 10/27/2007  . PERIPHERAL VASCULAR DISEASE 10/27/2007  . Allergic rhinitis 10/27/2007  . ASTHMA 10/27/2007  . COPD (chronic obstructive pulmonary disease) ([place]) 10/27/2007  . BENIGN PROSTATIC HYPERTROPHY 10/27/2007  . RASH-NONVESICULAR 10/27/2007  . COLONIC POLYPS, HX OF 10/27/2007  . Hyperlipidemia 04/03/2007  . Essential hypertension 04/03/2007   PCP:  Biagio Borg, MD Pharmacy:   [facility] [place] [address] Phone: [phone] Fax: [phone]  [place], [place] [address] Phone: [phone] Fax: [phone]     Social Determinants of Health (SDOH) Interventions    Readmission Risk Interventions 30 Day Unplanned Readmission Risk Score     ED to Hosp-Admission (Current) from 11/02/2018 in [place] [facility] [place] [facility]  30 Day Unplanned Readmission Risk Score (%)  16 Filed at 11/03/2018 1200     This score is the patient's risk of an unplanned readmission within 30 days of being discharged (0 -100%). The score is based on dignosis, age, lab data, medications, orders, and past utilization.   Low:  0-14.9   Medium: 15-21.9   High: 22-29.9   Extreme: 30 and above       No flowsheet data found.

## 2018-11-04 ENCOUNTER — Inpatient Hospital Stay (HOSPITAL_COMMUNITY): Payer: Medicare HMO

## 2018-11-04 DIAGNOSIS — D649 Anemia, unspecified: Secondary | ICD-10-CM

## 2018-11-04 DIAGNOSIS — B0223 Postherpetic polyneuropathy: Secondary | ICD-10-CM

## 2018-11-04 LAB — COMPREHENSIVE METABOLIC PANEL
ALT: 12 U/L (ref 0–44)
AST: 15 U/L (ref 15–41)
Albumin: 2.5 g/dL — ABNORMAL LOW (ref 3.5–5.0)
Alkaline Phosphatase: 54 U/L (ref 38–126)
Anion gap: 7 (ref 5–15)
BUN: 19 mg/dL (ref 8–23)
CO2: 27 mmol/L (ref 22–32)
CREATININE: 1.13 mg/dL (ref 0.61–1.24)
Calcium: 8.3 mg/dL — ABNORMAL LOW (ref 8.9–10.3)
Chloride: 104 mmol/L (ref 98–111)
GFR calc Af Amer: >60 mL/min (ref >60)
GFR calc non Af Amer: >60 mL/min (ref >60)
Glucose, Bld: 93 mg/dL (ref 70–99)
Potassium: 3.6 mmol/L (ref 3.5–5.1)
Sodium: 138 mmol/L (ref 135–145)
Total Bilirubin: 0.7 mg/dL (ref 0.3–1.2)
Total Protein: 5.7 g/dL — ABNORMAL LOW (ref 6.5–8.1)

## 2018-11-04 LAB — CBC WITH DIFFERENTIAL/PLATELET
Abs Immature Granulocytes: 0.03 10*3/uL (ref 0.00–0.07)
Basophils Absolute: 0 10*3/uL (ref 0.0–0.1)
Basophils Relative: 0 %
Eosinophils Absolute: 0.2 10*3/uL (ref 0.0–0.5)
Eosinophils Relative: 2 %
HCT: 40.7 % (ref 39.0–52.0)
Hemoglobin: 12.9 g/dL — ABNORMAL LOW (ref 13.0–17.0)
Immature Granulocytes: 0 %
Lymphocytes Relative: 6 %
Lymphs Abs: 0.6 10*3/uL — ABNORMAL LOW (ref 0.7–4.0)
MCH: 26 pg (ref 26.0–34.0)
MCHC: 31.7 g/dL (ref 30.0–36.0)
MCV: 82.1 fL (ref 80.0–100.0)
MONO ABS: 0.5 10*3/uL (ref 0.1–1.0)
Monocytes Relative: 5 %
Neutro Abs: 8 10*3/uL — ABNORMAL HIGH (ref 1.7–7.7)
Neutrophils Relative %: 87 %
Platelets: 144 10*3/uL — ABNORMAL LOW (ref 150–400)
RBC: 4.96 MIL/uL (ref 4.22–5.81)
RDW: 15.3 % (ref 11.5–15.5)
WBC: 9.3 10*3/uL (ref 4.0–10.5)
nRBC: 0 % (ref 0.0–0.2)

## 2018-11-04 LAB — HEMOGLOBIN A1C
Hgb A1c MFr Bld: 5.4 % (ref 4.8–5.6)
Mean Plasma Glucose: 108.28 mg/dL

## 2018-11-04 LAB — LEGIONELLA PNEUMOPHILA SEROGP 1 UR AG: L. pneumophila Serogp 1 Ur Ag: NEGATIVE

## 2018-11-04 LAB — PHOSPHORUS: Phosphorus: 3.5 mg/dL (ref 2.5–4.6)

## 2018-11-04 LAB — MAGNESIUM: Magnesium: 1.7 mg/dL (ref 1.7–2.4)

## 2018-11-04 MED ORDER — IPRATROPIUM-ALBUTEROL 0.5-2.5 (3) MG/3ML IN SOLN
3.0000 mL | Freq: Three times a day (TID) | RESPIRATORY_TRACT | Status: DC
  Administered 2018-11-04 – 2018-11-05 (×3): 3 mL via RESPIRATORY_TRACT
  Filled 2018-11-04 (×3): qty 3

## 2018-11-04 NOTE — Progress Notes (Signed)
Nutrition Consult/Brief Note  RD consulted via COPD Focused Protocol.  Wt Readings from Last 15 Encounters:  11/04/18 71 kg  10/14/18 71.2 kg  07/19/18 69.4 kg  05/24/18 65.8 kg  01/04/18 61.7 kg  10/22/17 62.1 kg  07/28/17 62.1 kg  04/21/17 66.7 kg  11/03/16 66.6 kg  09/03/16 60.8 kg  08/13/16 59.4 kg  07/16/16 63.5 kg  06/21/16 62.2 kg  03/27/16 61.2 kg  11/17/15 65.4 kg   Body mass index is 25.26 kg/m. Patient meets criteria for Overweight  based on current BMI.   Current diet order is Regular, patient is consuming approximately 100% of meals at this time. Labs and medications reviewed.   No nutrition interventions warranted at this time. If nutrition issues arise, please consult RD.   Arthur Holms, RD, LDN Pager #: [phone] After-Hours Pager #: [phone]

## 2018-11-04 NOTE — Progress Notes (Signed)
PROGRESS NOTE    Matthew Gray  ZOX:[id number] DOB: [birth year] DOA: 11/02/2018 PCP: Biagio Borg, MD   Brief Narrative:  HPI per Dr. Barb Merino on 11/02/2018 Matthew Gray is a 85 y.o. male with medical history significant of dementia, COPD, coronary artery disease, hyperlipidemia who presents to the emergency room with his daughter with fever and cough.  Patient is poor historian probably due to underlying cognitive dysfunction.  History is supplemented by daughter at the bedside.  According to her, he developed some cough and congestion about 3 weeks ago and was treated with Tamiflu.  After about a week he had another bout of cough and was given antibiotics.  He has been quite lethargic and coughing all throughout.  Last night her daughter noted him having shaking and chills.  He went to his adult daycare today, his temperature was 101 so was sent to the hospital.  Patient himself complains of cough with mucoid sputum and occasionally yellow-tinged on it.  He denies any chest pain.  Wheezing present but no shortness of breath.  Denies any nausea vomiting.  Urine and bowel habits are normal.  **Interim History The patient states he was doing ok and wanting to go home. Surprised he has a Pneumonia. States he is not able to cough up much sputum. Desaturated yesterday with Home O2 Screen. Case Management to discuss Home Health with patient's daughter.  Assessment & Plan:   Principal Problem:   Community acquired pneumonia Active Problems:   Postherpetic polyneuropathy   Vitamin D deficiency   Hyperlipidemia   Essential hypertension   Coronary atherosclerosis   COPD (chronic obstructive pulmonary disease) (HCC)   Dementia, Alzheimer's, with behavior disturbance (HCC)   Pneumonia  Strep Pneumo Community-acquired Pneumonia   -Failed outpatient therapy. -Was admitted due to Severity of the Sumptoms -C/w Antibiotics to treat bacterial pneumonia.  We will continue Rocephin and doxycycline  because of history of flulike symptoms. -Influenza A and B via PCR was negative -Chest physiotherapy, incentive spirometry, deep breathing exercises, sputum induction, mucolytic's and bronchodilators. -Added DuoNeb q6h and will C/w Albuterol 2.5 mg Neb q2hprn -Checked Sputum cultures and Gram Stain showed RARE WBC PRESENT, PREDOMINANTLY PMN  FEW GRAM POSITIVE COCCI IN PAIRS  FEW GRAM NEGATIVE RODS  FEW GRAM POSITIVE RODS  With Cx pending and reincubated for better growth. -Blood cultures (NGTD at 2 Days), Legionella pending and streptococcal Pneumo antigen was POSITIVE. -LA went from 1.5 -> 1.2 -Added Mucinex 1200 mg po BID -WBC worsened and went from 8.3 -> 11.7 -> 13.3 -> 9.3 -C/w Supplemental oxygen to keep saturations more than 90%. Home O2 Screen Done yesterday and patient desaturated  -Speech evaluation to rule out aspiration pneumonia because of pneumonia of the lower lobes and patient is a low aspiration risk and was placed on a Regular Diet with Thin Liquids  -Repeat CXR this AM showed Unchanged lower lobe airspace disease. COPD. -Will repeat Home O2 Screen in the AM   Hypertension -Blood pressures was stable and slightly elevated at 125/83 this AM.   -Resume home medications and will continue Amlodipine 5 mg po Daily -Continue to Monitor Blood Pressures Carefully  History of Coronary Artery Disease -Denies any chest pain.   -Patient is on Aspirin 81 mg po Daily -Per MAR Review not on any Statin or BB  COPD with slight Exacerbation -See as above for Nebs -Added Flutter Valve, Incentive Spirometry and Guaifenesin 1200 mg po BID -If significantly wheezing will add IV Steroids but will hold  off for now  Dementia -Lives at home with family and goes to day care.  Has good family support. -C/w Donepezil 10 mg po Daily along with Quetiapine 50 mg po Daily and 75 mg po qHS -Delirium Precautions  HLD -Last Lipid Panel on 2/20 showed cholesterol HDL ratio 4, cholesterol level  of 207, HDL of 53.4, LDL 135, non-HDL 153.59, triglycerides of 93.0 and VLDL of 18.6 -Currently not on any statins will continue to monitor  Hyperbilirubinemia -Mild at 1.4 and improved to 0.7 -Likely reactive in the setting of infection -Continue to monitor and repeat CMP in a.m.  Mildly elevated creatinine/AKI on CKD stage II -Baseline creatinine ranges from 2.9-1.1 -Slightly elevated and was 1.28 yesterday and was 1.13 this AM -Continue monitor and avoid nephrotoxic medications as well as contrast dyes if possible -Repeat CMP in the a.m.  Hyperglycemia -Patient's Glucose ranging from 104-114 on Daily CMP's -Checked HbA1c and was 5.4 -Continue to Monitor Blood Sugars Carefully and if consistently elevated will place on Sensitive Novolog SSI  Thrombocytopenia -Mild at 144K and was 180K on admission -In the setting of Pneumonia -Continue to Monitor for S/Sx of Bleeding -Repeat CBC in AM   Overweight -Estimated body mass index is 25.26 kg/m as calculated from the following:   Height as of this encounter: 5\' 6"  (1.676 m).   Weight as of this encounter: 71 kg. -Dietician consulted   Normocytic Anemia -Patient's Hb/Hct went from 14.7/47.1 -> 13.9/45.0 -> 12.9/40.7 -In the setting of CKD Stage 3 -Check Anemia Panel in the AM -Continue to Monitor for S/Sx of Bleeding -Repeat CBC in AM   DVT prophylaxis: Enoxaparin 40 mg sq q24h Code Status: FULL CODE Family Communication: Discussed with Daughter over the phone Disposition Plan: Pending Clinical Improvement back to baseline; Will repeat Home O2 Screen in the AM   Consultants:   None   Procedures:  None   Antimicrobials:  Anti-infectives (From admission, onward)   Start     Dose/Rate Route Frequency Ordered Stop   11/02/18 2200  doxycycline (VIBRA-TABS) tablet 100 mg  Status:  Discontinued     100 mg Oral Every 12 hours 11/02/18 1739 11/03/18 1428   11/02/18 1830  cefTRIAXone (ROCEPHIN) 1 g in sodium chloride 0.9 %  100 mL IVPB     1 g 200 mL/hr over 30 Minutes Intravenous Every 24 hours 11/02/18 1739 11/09/18 1829   11/02/18 1415  doxycycline (VIBRAMYCIN) 100 mg in sodium chloride 0.9 % 250 mL IVPB     100 mg 125 mL/hr over 120 Minutes Intravenous  Once 11/02/18 1407 11/02/18 1715     Subjective: Seen and examined at bedside he is pleasantly demented and sitting in the chair bedside. No CP, lightheadedness or dizziness. Still coughing. No other concerns or complaints at this time.   Objective: Vitals:   11/03/18 2015 11/03/18 2255 11/04/18 0818 11/04/18 1349  BP:  140/78 125/83   Pulse:  72 90   Resp:  15 19   Temp:  98.3 F (36.8 C) 98.7 F (37.1 C)   TempSrc:  Oral    SpO2: 96% 95% (!) 79%   Weight:    71 kg  Height:    5\' 6"  (1.676 m)    Intake/Output Summary (Last 24 hours) at 11/04/2018 1453 Last data filed at 11/04/2018 1100 Gross per 24 hour  Intake 700 ml  Output -  Net 700 ml   Filed Weights   11/04/18 1349  Weight: 71 kg   Examination:  Physical Exam:  Constitutional: Well-nourished, well-developed elderly African-American male currently no acute distress appears calm and comfortable sitting in the chair bedside watching television and is pleasantly demented. Eyes: Conjunctive are normal.  Sclera anicteric ENMT: External ears and nose appear normal.  Grossly normal hearing Neck: Appears supple no JVD Respiratory: Diminished auscultation bilaterally with improved wheezing and slight rhonchi.  No appreciable crackles or rales but does have some coarse breath sounds.  Has a normal respiratory effort and he is not tachypneic or using any accessory muscles to breathe Cardiovascular: Regular rate and rhythm.  Has a 2 out of 6 systolic murmur.  No appreciable extremity edema Abdomen: Soft, nontender, distended slightly.  Bowel sounds present in 4 quadrants GU: Deferred Musculoskeletal: No contractures or cyanosis.  No joint deformities in upper lower extremities Skin: Skin is  warm and dry no appreciable rashes or lesions limited skin evaluation Neurologic: Cranial nerves II through XII grossly intact no appreciable focal deficits.  Romberg sign and cerebellar reflexes were not assessed Psychiatric: Has impaired judgment and insight.  He is alert and oriented x1.  Has a normal mood and affect  Data Reviewed: I have personally reviewed following labs and imaging studies  CBC: Recent Labs  Lab 11/02/18 1318 11/03/18 0932 11/04/18 0255  WBC 11.7* 13.3* 9.3  NEUTROABS 11.1* 12.2* 8.0*  HGB 14.7 13.9 12.9*  HCT 47.1 45.0 40.7  MCV 83.1 83.5 82.1  PLT 180 147* 720*   Basic Metabolic Panel: Recent Labs  Lab 11/02/18 1318 11/03/18 0932 11/04/18 0255  NA 137 138 138  K 4.4 4.0 3.6  CL 100 102 104  CO2 28 25 27   GLUCOSE 104* 114* 93  BUN 19 18 19   CREATININE 1.23 1.28* 1.13  CALCIUM 9.1 9.0 8.3*  MG  --  1.8 1.7  PHOS  --  3.0 3.5   GFR: Estimated Creatinine Clearance: 45.5 mL/min (by C-G formula based on SCr of 1.13 mg/dL). Liver Function Tests: Recent Labs  Lab 11/02/18 1318 11/03/18 0932 11/04/18 0255  AST 21 18 15   ALT 14 11 12   ALKPHOS 74 70 54  BILITOT 1.1 1.4* 0.7  PROT 7.0 6.5 5.7*  ALBUMIN 3.3* 2.9* 2.5*   No results for input(s): LIPASE, AMYLASE in the last 168 hours. No results for input(s): AMMONIA in the last 168 hours. Coagulation Profile: Recent Labs  Lab 11/02/18 1318  INR 1.0   Cardiac Enzymes: No results for input(s): CKTOTAL, CKMB, CKMBINDEX, TROPONINI in the last 168 hours. BNP (last 3 results) No results for input(s): PROBNP in the last 8760 hours. HbA1C: Recent Labs    11/04/18 0255  HGBA1C 5.4   CBG: No results for input(s): GLUCAP in the last 168 hours. Lipid Profile: No results for input(s): CHOL, HDL, LDLCALC, TRIG, CHOLHDL, LDLDIRECT in the last 72 hours. Thyroid Function Tests: No results for input(s): TSH, T4TOTAL, FREET4, T3FREE, THYROIDAB in the last 72 hours. Anemia Panel: No results for  input(s): VITAMINB12, FOLATE, FERRITIN, TIBC, IRON, RETICCTPCT in the last 72 hours. Sepsis Labs: Recent Labs  Lab 11/02/18 1318 11/02/18 1752  LATICACIDVEN 1.5 1.2    Recent Results (from the past 240 hour(s))  Culture, blood (Routine x 2)     Status: None (Preliminary result)   Collection Time: 11/02/18  1:20 PM  Result Value Ref Range Status   Specimen Description BLOOD LEFT ARM  Final   Special Requests   Final    BOTTLES DRAWN AEROBIC AND ANAEROBIC Blood Culture adequate volume  Culture   Final    NO GROWTH 2 DAYS Performed at [facility], [place] [address] [place], [place] [address]    Report Status PENDING  Incomplete  Culture, blood (Routine x 2)     Status: None (Preliminary result)   Collection Time: 11/02/18  1:55 PM  Result Value Ref Range Status   Specimen Description BLOOD RIGHT FOREARM  Final   Special Requests   Final    BOTTLES DRAWN AEROBIC AND ANAEROBIC Blood Culture adequate volume   Culture   Final    NO GROWTH 2 DAYS Performed at [facility], [place] [address] [place], [place] [address]    Report Status PENDING  Incomplete  Culture, sputum-assessment     Status: None   Collection Time: 11/03/18  5:27 AM  Result Value Ref Range Status   Specimen Description SPUTUM  Final   Special Requests NONE  Final   Sputum evaluation   Final    THIS SPECIMEN IS ACCEPTABLE FOR SPUTUM CULTURE Performed at [facility], [address] [place], [place] [address]    Report Status 11/03/2018 FINAL  Final  Culture, respiratory     Status: None (Preliminary result)   Collection Time: 11/03/18  5:27 AM  Result Value Ref Range Status   Specimen Description SPUTUM  Final   Special Requests NONE Reflexed from [phone]  Final   Gram Stain   Final    RARE WBC PRESENT, PREDOMINANTLY PMN FEW GRAM POSITIVE COCCI IN PAIRS FEW GRAM NEGATIVE RODS FEW GRAM POSITIVE RODS    Culture   Final    CULTURE REINCUBATED FOR BETTER GROWTH Performed at [facility], [place] [address], [place], [place] [address]    Report Status PENDING  Incomplete    Radiology Studies: Dg Chest Port 1 View  Result Date: 11/04/2018 CLINICAL DATA:  Shortness of breath EXAM: PORTABLE CHEST 1 VIEW COMPARISON:  Two days ago FINDINGS: Emphysema. Unchanged retrocardiac more than right lower lobe opacity. Trace pleural effusions. Chronic cardiomegaly. IMPRESSION: 1. Unchanged lower lobe airspace disease. 2. COPD. Electronically Signed   By: Monte Fantasia M.D.   On: 11/04/2018 09:37   Dg Swallowing Func-speech Pathology  Result Date: 11/04/2018 Objective Swallowing Evaluation: Type of Study: MBS-Modified Barium Swallow Study  Patient Details Name: Roshun Klingensmith MRN: [id number] Date of Birth: [birth year] Today's Date: 11/04/2018 Time: SLP Start Time (ACUTE ONLY): 1003 -SLP Stop Time (ACUTE ONLY): 1012 SLP Time Calculation (min) (ACUTE ONLY): 9 min Past Medical History: Past Medical History: Diagnosis Date . ALLERGIC RHINITIS 10/27/2007 . ANKLE PAIN, RIGHT 11/30/2009 . ASTHMA 10/27/2007 . BACK PAIN 10/08/2009 . BENIGN PROSTATIC HYPERTROPHY 10/27/2007 . CHRONIC OBSTRUCTIVE PULMONARY DISEASE, ACUTE EXACERBATION 11/22/2007 . COLONIC POLYPS, HX OF 10/27/2007 . COPD 10/27/2007 . CORONARY ARTERY DISEASE 10/27/2007 . Dementia ([place]) 01/13/2013 . DEPRESSION 11/30/2009 . FATIGUE 11/30/2009 . Hemoptysis 04/24/2009 . HYPERLIPIDEMIA 04/03/2007 . HYPERTENSION 04/03/2007 . MYALGIA 02/04/2008 . Obstructive chronic bronchitis without exacerbation ([place])  . Pain in Soft Tissues of Limb 02/04/2008 . PARESTHESIA 09/02/2010 . PERIPHERAL EDEMA 11/16/2009 . Peripheral neuropathy 03/08/2012 . PERIPHERAL VASCULAR DISEASE 10/27/2007 . 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 . RASH-NONVESICULAR 10/27/2007 . SCIATICA, LEFT 11/08/2009 . SHOULDER PAIN, LEFT 06/21/2009 . SINUSITIS- ACUTE-NOS 10/08/2009 . VITAMIN D DEFICIENCY 06/04/2010 Past Surgical History: Past Surgical History: Procedure Laterality Date . None   HPI: Jayshun Galentine is a 85 y.o. male with medical  history significant of dementia, COPD, asthma, myalgia, coronary artery disease, hyperlipidemia, attends adult daycare who presents  to the emergency room with fever and cough. Chest x-ray shows left lower lobe pneumonia.  No data recorded Assessment / Plan / Recommendation CHL IP CLINICAL IMPRESSIONS 11/04/2018 Clinical Impression Pt exhibited min-mild oral dysphagia with normal pharyngeal phase swallow function. Oral phase marked by minimal premature spill with thin into pharynx momentarily before swallow initiated. He demonstrated difficulty coordinating pill and thin liquid with pill remaining in oral cavity and requiring additional sip to transit. Sensation and motor components of swallow were within normal limits without penetration or aspiration. Pt coughed throughout evalutuation without material in vestibule or below vocal cords as aformentioned. Coughing due to pneumonia versus aspiration during today's assessment. Recommend continue regular texture, thin liquids and pills whole in applesauce. No further ST needed.  SLP Visit Diagnosis Dysphagia, oral phase (R13.11) Attention and concentration deficit following -- Frontal lobe and executive function deficit following -- Impact on safety and function Mild aspiration risk   CHL IP TREATMENT RECOMMENDATION 11/04/2018 Treatment Recommendations No treatment recommended at this time   Prognosis 11/03/2018 Prognosis for Safe Diet Advancement (No Data) Barriers to Reach Goals Cognitive deficits Barriers/Prognosis Comment -- CHL IP DIET RECOMMENDATION 11/04/2018 SLP Diet Recommendations Regular solids;Thin liquid Liquid Administration via Cup;Straw Medication Administration Whole meds with puree Compensations Slow rate;Small sips/bites Postural Changes Seated upright at 90 degrees   CHL IP OTHER RECOMMENDATIONS 11/04/2018 Recommended Consults -- Oral Care Recommendations Oral care BID Other Recommendations --   CHL IP FOLLOW UP RECOMMENDATIONS 11/04/2018 Follow up  Recommendations None   CHL IP FREQUENCY AND DURATION 11/03/2018 Speech Therapy Frequency (ACUTE ONLY) min 2x/week Treatment Duration 2 weeks      CHL IP ORAL PHASE 11/04/2018 Oral Phase Impaired Oral - Pudding Teaspoon -- Oral - Pudding Cup -- Oral - Honey Teaspoon -- Oral - Honey Cup -- Oral - Nectar Teaspoon -- Oral - Nectar Cup -- Oral - Nectar Straw -- Oral - Thin Teaspoon -- Oral - Thin Cup Premature spillage Oral - Thin Straw -- Oral - Puree -- Oral - Mech Soft -- Oral - Regular WFL Oral - Multi-Consistency -- Oral - Pill Reduced posterior propulsion Oral Phase - Comment --  CHL IP PHARYNGEAL PHASE 11/04/2018 Pharyngeal Phase WFL Pharyngeal- Pudding Teaspoon -- Pharyngeal -- Pharyngeal- Pudding Cup -- Pharyngeal -- Pharyngeal- Honey Teaspoon -- Pharyngeal -- Pharyngeal- Honey Cup -- Pharyngeal -- Pharyngeal- Nectar Teaspoon -- Pharyngeal -- Pharyngeal- Nectar Cup -- Pharyngeal -- Pharyngeal- Nectar Straw -- Pharyngeal -- Pharyngeal- Thin Teaspoon -- Pharyngeal -- Pharyngeal- Thin Cup -- Pharyngeal -- Pharyngeal- Thin Straw -- Pharyngeal -- Pharyngeal- Puree -- Pharyngeal -- Pharyngeal- Mechanical Soft -- Pharyngeal -- Pharyngeal- Regular -- Pharyngeal -- Pharyngeal- Multi-consistency -- Pharyngeal -- Pharyngeal- Pill -- Pharyngeal -- Pharyngeal Comment --  CHL IP CERVICAL ESOPHAGEAL PHASE 11/04/2018 Cervical Esophageal Phase WFL Pudding Teaspoon -- Pudding Cup -- Honey Teaspoon -- Honey Cup -- Nectar Teaspoon -- Nectar Cup -- Nectar Straw -- Thin Teaspoon -- Thin Cup -- Thin Straw -- Puree -- Mechanical Soft -- Regular -- Multi-consistency -- Pill -- Cervical Esophageal Comment -- Houston Siren 11/04/2018, 12:42 PM  [name] [employer] M.Ed Actor Pager [phone] Office [phone]             Scheduled Meds: . acetaminophen  1,000 mg Oral Once  . amLODipine  5 mg Oral Daily  . aspirin EC  81 mg Oral Daily  . donepezil  10 mg Oral Q0200  . enoxaparin (LOVENOX) injection  40 mg  Subcutaneous Q24H  .  guaiFENesin  1,200 mg Oral BID  . ipratropium-albuterol  3 mL Nebulization TID  . multivitamin with minerals  1 tablet Oral Daily  . QUEtiapine  50 mg Oral Daily  . QUEtiapine  75 mg Oral QHS   Continuous Infusions: . cefTRIAXone (ROCEPHIN)  IV Stopped (11/03/18 1822)    LOS: 2 days   Kerney Elbe, DO [facility] PAGER is on [place]  If 7PM-7AM, please contact night-coverage [email or web address] Password [facility] 11/04/2018, 2:53 PM

## 2018-11-04 NOTE — Progress Notes (Signed)
Modified Barium Swallow Progress Note  Patient Details  Name: Matthew Gray MRN: [id number] Date of Birth: 06-23-1934  Today's Date: 11/04/2018  Modified Barium Swallow completed.  Full report located under Chart Review in the Imaging Section.  Brief recommendations include the following:  Clinical Impression  Pt exhibited min-mild oral dysphagia with normal pharyngeal phase swallow function. Oral phase marked by minimal premature spill with thin into pharynx momentarily before swallow initiated. He demonstrated difficulty coordinating pill and thin liquid with pill remaining in oral cavity and requiring additional sip to transit. Sensation and motor components of swallow were within normal limits without penetration or aspiration. Pt coughed throughout evalutuation without material in vestibule or below vocal cords as aformentioned. Coughing due to pneumonia versus aspiration during today's assessment. Recommend continue regular texture, thin liquids and pills whole in applesauce. No further ST needed.    Swallow Evaluation Recommendations       SLP Diet Recommendations: Regular solids;Thin liquid   Liquid Administration via: Cup;Straw   Medication Administration: Whole meds with puree   Supervision: Patient able to self feed   Compensations: Slow rate;Small sips/bites   Postural Changes: Seated upright at 90 degrees   Oral Care Recommendations: Oral care BID        Houston Siren 11/04/2018,12:46 PM    [name] [employer].Ed Risk analyst [phone] Office [phone]

## 2018-11-05 ENCOUNTER — Inpatient Hospital Stay (HOSPITAL_COMMUNITY): Payer: Medicare HMO

## 2018-11-05 DIAGNOSIS — Z23 Encounter for immunization: Secondary | ICD-10-CM | POA: Diagnosis not present

## 2018-11-05 LAB — CBC WITH DIFFERENTIAL/PLATELET
Abs Immature Granulocytes: 0.02 10*3/uL (ref 0.00–0.07)
BASOS PCT: 0 %
Basophils Absolute: 0 10*3/uL (ref 0.0–0.1)
EOS PCT: 3 %
Eosinophils Absolute: 0.2 10*3/uL (ref 0.0–0.5)
HCT: 41.9 % (ref 39.0–52.0)
Hemoglobin: 12.9 g/dL — ABNORMAL LOW (ref 13.0–17.0)
Immature Granulocytes: 0 %
Lymphocytes Relative: 8 %
Lymphs Abs: 0.6 10*3/uL — ABNORMAL LOW (ref 0.7–4.0)
MCH: 25.6 pg — ABNORMAL LOW (ref 26.0–34.0)
MCHC: 30.8 g/dL (ref 30.0–36.0)
MCV: 83.3 fL (ref 80.0–100.0)
MONOS PCT: 5 %
Monocytes Absolute: 0.4 10*3/uL (ref 0.1–1.0)
Neutro Abs: 5.9 10*3/uL (ref 1.7–7.7)
Neutrophils Relative %: 84 %
Platelets: 151 10*3/uL (ref 150–400)
RBC: 5.03 MIL/uL (ref 4.22–5.81)
RDW: 15.4 % (ref 11.5–15.5)
WBC: 7.1 10*3/uL (ref 4.0–10.5)
nRBC: 0 % (ref 0.0–0.2)

## 2018-11-05 LAB — COMPREHENSIVE METABOLIC PANEL
ALT: 12 U/L (ref 0–44)
AST: 18 U/L (ref 15–41)
Albumin: 2.5 g/dL — ABNORMAL LOW (ref 3.5–5.0)
Alkaline Phosphatase: 56 U/L (ref 38–126)
Anion gap: 8 (ref 5–15)
BUN: 16 mg/dL (ref 8–23)
CO2: 28 mmol/L (ref 22–32)
Calcium: 8.4 mg/dL — ABNORMAL LOW (ref 8.9–10.3)
Chloride: 104 mmol/L (ref 98–111)
Creatinine, Ser: 1.21 mg/dL (ref 0.61–1.24)
GFR calc Af Amer: >60 mL/min (ref >60)
GFR calc non Af Amer: 55 mL/min — ABNORMAL LOW (ref >60)
Glucose, Bld: 104 mg/dL — ABNORMAL HIGH (ref 70–99)
POTASSIUM: 3.6 mmol/L (ref 3.5–5.1)
Sodium: 140 mmol/L (ref 135–145)
Total Bilirubin: 0.5 mg/dL (ref 0.3–1.2)
Total Protein: 5.8 g/dL — ABNORMAL LOW (ref 6.5–8.1)

## 2018-11-05 LAB — CULTURE, RESPIRATORY W GRAM STAIN: Culture: NORMAL

## 2018-11-05 LAB — PHOSPHORUS: Phosphorus: 3.5 mg/dL (ref 2.5–4.6)

## 2018-11-05 LAB — MAGNESIUM: Magnesium: 1.8 mg/dL (ref 1.7–2.4)

## 2018-11-05 MED ORDER — CEFDINIR 300 MG PO CAPS
300.0000 mg | ORAL_CAPSULE | Freq: Two times a day (BID) | ORAL | Status: DC
  Administered 2018-11-05: 300 mg via ORAL
  Filled 2018-11-05: qty 1

## 2018-11-05 MED ORDER — ACETAMINOPHEN 325 MG PO TABS: 650.0000 mg | ORAL_TABLET | Freq: Four times a day (QID) | ORAL | 0 refills | Status: DC | PRN

## 2018-11-05 MED ORDER — CEFDINIR 300 MG PO CAPS: 300.0000 mg | ORAL_CAPSULE | Freq: Two times a day (BID) | ORAL | 0 refills | Status: AC

## 2018-11-05 MED ORDER — IPRATROPIUM-ALBUTEROL 0.5-2.5 (3) MG/3ML IN SOLN: 3.0000 mL | Freq: Four times a day (QID) | RESPIRATORY_TRACT | 0 refills | Status: DC | PRN

## 2018-11-05 MED ORDER — GUAIFENESIN ER 600 MG PO TB12: 1200.0000 mg | ORAL_TABLET | Freq: Two times a day (BID) | ORAL | 0 refills | Status: DC

## 2018-11-05 NOTE — Care Management Important Message (Signed)
Important Message  Patient Details  Name: Matthew Gray MRN: [id number] Date of Birth: 22-Apr-1934   Medicare Important Message Given:       [name] 11/05/2018, 2:47 PM

## 2018-11-05 NOTE — Progress Notes (Signed)
SATURATION QUALIFICATIONS: (This note is used to comply with regulatory documentation for home oxygen)  Patient Saturations on Room Air at Rest = 94%  Patient Saturations on Room Air while Ambulating = 89%  Patient Saturations on 0 Liters of oxygen while Ambulating = 89%  No SOB

## 2018-11-05 NOTE — TOC Transition Note (Addendum)
Transition of Care [facility]) - CM/SW Discharge Note   Patient Details  Name: Matthew Gray MRN: [id number] Date of Birth: [birth year]  [facility]) CM/SW Contact:  Sharin Mons, RN Phone Number: 11/05/2018, 12:09 PM   Clinical Narrative:    NCM spoke wih pt/daughter, both declined home health PT services per [name]  recommendations. Daughter to provide transportation to home.  Final next level of care: Home/Self Care(declined home health services) Barriers to Discharge: No Barriers Identified   Patient Goals and CMS Choice Patient states their goals for this hospitalization and ongoing recovery are:: wants to continue going to adult day care CMS Medicare.gov Compare Post Acute Care list provided to:: Patient Choice offered to / list presented to : Patient(left in room for daughter to review)                       Discharge Plan and Services Discharge Planning Services: CM Consult                      Readmission Risk Interventions No flowsheet data found.

## 2018-11-05 NOTE — Discharge Summary (Signed)
Physician Discharge Summary  [name] YHC:[id number] DOB: Feb 03, 1934 DOA: 11/02/2018  PCP: Biagio Borg, MD  Admit date: 11/02/2018 Discharge date: 11/05/2018  Admitted From: Home Disposition: Home with [employer] PT  Recommendations for Outpatient Follow-up:  1. Follow up with PCP in 1-2 weeks 2. Repeat CXR in 3-6 Weeks  3. Please obtain CMP/CBC, Mag, Phos in one week 4. Please follow up on the following pending results:  Home Health: YES  Equipment/Devices: DME Nebulizer  Discharge Condition: Stable  CODE STATUS: FULL CODE Diet recommendation: Heart Healthy Diet  Brief/Interim Summary: HPI per Dr. Barb Merino on 11/02/2018 Matthew Gray a 85 y.o.malewith medical history significant ofdementia, COPD, coronary artery disease, hyperlipidemia who presents to the emergency room with his daughter with fever and cough. Patient is poor historian probably due to underlying cognitive dysfunction. History is supplemented by daughter at the bedside. According to her, he developed some cough and congestion about 3 weeks ago and was treated with Tamiflu. After about a week he had another bout of cough and was given antibiotics. He has been quite lethargic and coughing all throughout. Last night her daughter noted him having shaking and chills. He went to his adult daycare today, his temperature was 101 so was sent to the hospital. Patient himself complains of cough with mucoid sputum and occasionally yellow-tinged on it. He denies any chest pain. Wheezing present but no shortness of breath. Denies any nausea vomiting. Urine and bowel habits are normal.  **Interim History He was treated with Nebs and Abx and improved. Desaturated the day before yesterday with Home O2 Screen. Case Management to discuss Home Health with patient's daughter. Repeat Home O2 screen showed that the patient did not desaturate.  He improved and he was deemed medically stable to be discharged will need to  follow-up with PCP and repeat chest x-ray in 3 to 6 weeks.  He will be transitioned to oral antibiotics for completion of 7 days and will need to be followed up within 1 week of discharge by his PCP.  Discharge Diagnoses:  Principal Problem:   Community acquired pneumonia Active Problems:   Postherpetic polyneuropathy   Vitamin D deficiency   Hyperlipidemia   Essential hypertension   Coronary atherosclerosis   COPD (chronic obstructive pulmonary disease) (HCC)   Dementia, Alzheimer's, with behavior disturbance (HCC)   Pneumonia  Strep Pneumo Community-Acquired Pneumonia   -Failed outpatient therapy. -Was admitted due to Severity of the Sumptoms -C/w Antibiotics to treat bacterial pneumonia. We will continue Rocephin and doxycycline because of history of flulike symptoms. -Influenza A and B via PCR was negative -Chest physiotherapy, incentive spirometry, deep breathing exercises, sputum induction, mucolytic's and bronchodilators. -Added DuoNeb q6h and will C/w Albuterol 2.5 mg Neb q2hprn -Checked Sputum cultures and Gram Stain showed RARE WBC PRESENT, PREDOMINANTLY PMN  FEW GRAM POSITIVE COCCI IN PAIRS  FEW GRAM NEGATIVE RODS  FEW GRAM POSITIVE RODS  With Cx consistent with Normal Respiratory Flora. -Blood cultures (NGTD at 2 Days still), Legionella Negative and streptococcal Pneumo antigen was POSITIVE. -LA went from 1.5 -> 1.2 -Added Mucinex 1200 mg po BID -WBC worsened and went from 8.3 -> 11.7 -> 13.3 -> 9.3 -> 7.1 -C/w Supplemental oxygen to keep saturations more than 90%. Home O2 Screen Done yesterday and patient desaturated  -Speech evaluation to rule out aspiration pneumonia because of pneumonia of the lower lobes and patient is a low aspiration risk and was placed on a Regular Diet with Thin Liquids  -Repeat CXR this  AM showed "Unchanged appearance of the chest with continued LEFT LOWER lung airspace disease/consolidation." -Repeat Home O2 Screen this AM showed patient did  not desaturate and lowest O2 was 89% Ambulating   Hypertension -Blood pressures was stable and slightly elevated at 125/87 this AM.  -Resume home medications and will continue Amlodipine 5 mg po Daily -Continue to Monitor Blood Pressures Carefully  History of Coronary Artery Disease -Denies any chest pain.  -Patient is on Aspirin 81 mg po Daily -Per MAR Review not on any Statin or BB  COPD with slight Exacerbation -See as above for Nebs -Added Flutter Valve, Incentive Spirometry and Guaifenesin 1200 mg po BID -If significantly wheezing will add IV Steroids but will hold off for now as he is improving   Dementia -Lives at home with family and goes to day care. Has good family support. -C/w Donepezil 10 mg po Daily along with Quetiapine 50 mg po Daily and 75 mg po qHS -Delirium Precautions  HLD -Last Lipid Panel on 2/20 showed cholesterol HDL ratio 4, cholesterol level of 207, HDL of 53.4, LDL 135, non-HDL 153.59, triglycerides of 93.0 and VLDL of 18.6 -Currently not on any statins will continue to monitor  Hyperbilirubinemia -Mild at 1.4 and improved to 0.5 -Likely reactive in the setting of infection -Continue to monitor and repeat CMP as an outpatient   Mildly elevated creatinine/AKI on CKD stage II -Baseline creatinine ranges from 2.9-1.1 -Slightly elevated and was 1.28 at peak and was 1.21 this AM -Continue monitor and avoid nephrotoxic medications as well as contrast dyes if possible -Repeat CMP as an outpatient   Hyperglycemia -Patient's Glucose ranging from 93-114 on Daily CMP's -Checked HbA1c and was 5.4 -Continue to Monitor Blood Sugars Carefully and if consistently elevated will place on Sensitive Novolog SSI -Follow up with PCP as an outpatient   Thrombocytopenia -Was mild and now improved to 151K; was 180K on admission -In the setting of Pneumonia -Continue to Monitor for S/Sx of Bleeding -Repeat CBC as an outpatient   Overweight -Estimated  body mass index is 25.26 kg/m as calculated from the following:   Height as of this encounter: 5\' 6"  (1.676 m).   Weight as of this encounter: 71 kg. -Dietician consulted and no interventions are warranted at this time  Normocytic Anemia -Patient's Hb/Hct went from 14.7/47.1 -> 13.9/45.0 -> 12.9/40.7 -> 12.9/41.9 -In the setting of CKD Stage 3 -Check Anemia Panel as an outpatient -Continue to Monitor for S/Sx of Bleeding -Repeat CBC as an outpatient   Discharge Instructions Discharge Instructions    Call MD for:  difficulty breathing, headache or visual disturbances   Complete by:  As directed    Call MD for:  extreme fatigue   Complete by:  As directed    Call MD for:  hives   Complete by:  As directed    Call MD for:  persistant dizziness or light-headedness   Complete by:  As directed    Call MD for:  persistant nausea and vomiting   Complete by:  As directed    Call MD for:  redness, tenderness, or signs of infection (pain, swelling, redness, odor or green/yellow discharge around incision site)   Complete by:  As directed    Call MD for:  severe uncontrolled pain   Complete by:  As directed    Call MD for:  temperature >100.4   Complete by:  As directed    DME Nebulizer/meds   Complete by:  As directed  Patient needs a nebulizer to treat with the following condition:  Pneumonia   Diet - low sodium heart healthy   Complete by:  As directed    Discharge instructions   Complete by:  As directed    You were cared for by a hospitalist during your hospital stay. If you have any questions about your discharge medications or the care you received while you were in the hospital after you are discharged, you can call the unit and ask to speak with the hospitalist on call if the hospitalist that took care of you is not available. Once you are discharged, your primary care physician will handle any further medical issues. Please note that NO REFILLS for any discharge medications will  be authorized once you are discharged, as it is imperative that you return to your primary care physician (or establish a relationship with a primary care physician if you do not have one) for your aftercare needs so that they can reassess your need for medications and monitor your lab values.  Follow up with PCP and Repeat CXR in 3-6 weeks. Take all medications as prescribed. If symptoms change or worsen please return to the ED for evaluation   Increase activity slowly   Complete by:  As directed      Allergies as of 11/05/2018      Reactions   Atorvastatin Other (See Comments)   REACTION: myalgias   Simvastatin Other (See Comments)   REACTION: myalgias   Zetia [ezetimibe] Other (See Comments)   weakness   Ace Inhibitors Other (See Comments)   unknown   Penicillins Other (See Comments)   Unknown;  Tolerated Ceftriaxone 10/2018      Medication List    TAKE these medications   acetaminophen 325 MG tablet Commonly known as:  TYLENOL Take 2 tablets (650 mg total) by mouth every 6 (six) hours as needed for mild pain (or Fever >/= 101).   Alive Mens Energy Tabs Take 1 tablet by mouth daily.   amLODipine 5 MG tablet Commonly known as:  NORVASC Take 1 tablet (5 mg total) by mouth daily.   aspirin EC 81 MG tablet Take 1 tablet (81 mg total) by mouth daily.   cefdinir 300 MG capsule Commonly known as:  OMNICEF Take 1 capsule (300 mg total) by mouth every 12 (twelve) hours for 4 days.   cetirizine 10 MG tablet Commonly known as:  ZYRTEC Take 1 tablet (10 mg total) by mouth daily.   donepezil 10 MG tablet Commonly known as:  ARICEPT Take 1 tablet (10 mg total) by mouth daily at 2 am.   guaiFENesin 600 MG 12 hr tablet Commonly known as:  MUCINEX Take 2 tablets (1,200 mg total) by mouth 2 (two) times daily.   ipratropium-albuterol 0.5-2.5 (3) MG/3ML Soln Commonly known as:  DUONEB Take 3 mLs by nebulization every 6 (six) hours as needed.   QUEtiapine 25 MG tablet Commonly  known as:  SEROquel 2 tab by mouth in the AM, and 3 tab in the PM What changed:    how much to take  how to take this  when to take this  additional instructions            Durable Medical Equipment  (From admission, onward)         Start     Ordered   11/05/18 0000  DME Nebulizer/meds    Question:  Patient needs a nebulizer to treat with the following condition  Answer:  Pneumonia   11/05/18 1246          Allergies  Allergen Reactions  . Atorvastatin Other (See Comments)    REACTION: myalgias  . Simvastatin Other (See Comments)    REACTION: myalgias  . Zetia [Ezetimibe] Other (See Comments)    weakness  . Ace Inhibitors Other (See Comments)    unknown  . Penicillins Other (See Comments)    Unknown;  Tolerated Ceftriaxone 10/2018   Consultations:  None  Procedures/Studies: Dg Chest 2 View  Result Date: 11/02/2018 CLINICAL DATA:  Cough and fever EXAM: CHEST - 2 VIEW COMPARISON:  October 04, 2018 FINDINGS: There is airspace consolidation in the left lower lobe with small left pleural effusion. There is mild right base atelectasis. Heart is mildly enlarged with pulmonary vascularity normal. No adenopathy. There is aortic atherosclerosis. No bone lesions. IMPRESSION: Airspace consolidation consistent with pneumonia in the left lower lobe with small left pleural effusion. There is mild right base atelectasis. There is mild cardiomegaly. Aortic Atherosclerosis (ICD10-I70.0). Followup PA and lateral chest radiographs recommended in 3-4 weeks following trial of antibiotic therapy to ensure resolution and exclude underlying malignancy. Electronically Signed   By: Lowella Grip III M.D.   On: 11/02/2018 14:54   Dg Chest Port 1 View  Result Date: 11/05/2018 CLINICAL DATA:  Cough, congestion and shortness of breath. EXAM: PORTABLE CHEST 1 VIEW COMPARISON:  11/04/2018 and prior radiographs FINDINGS: Cardiomediastinal silhouette is unchanged. LEFT LOWER lung airspace  disease/consolidation again noted. Minimal RIGHT basilar atelectasis identified. Little significant change since the prior study. No pneumothorax. IMPRESSION: Unchanged appearance of the chest with continued LEFT LOWER lung airspace disease/consolidation. Electronically Signed   By: Margarette Canada M.D.   On: 11/05/2018 08:55   Dg Chest Port 1 View  Result Date: 11/04/2018 CLINICAL DATA:  Shortness of breath EXAM: PORTABLE CHEST 1 VIEW COMPARISON:  Two days ago FINDINGS: Emphysema. Unchanged retrocardiac more than right lower lobe opacity. Trace pleural effusions. Chronic cardiomegaly. IMPRESSION: 1. Unchanged lower lobe airspace disease. 2. COPD. Electronically Signed   By: Monte Fantasia M.D.   On: 11/04/2018 09:37   Dg Swallowing Func-speech Pathology  Result Date: 11/04/2018 Objective Swallowing Evaluation: Type of Study: MBS-Modified Barium Swallow Study  Patient Details Name: Byrne Capek MRN: [id number] Date of Birth: 1933-10-20 Today's Date: 11/04/2018 Time: SLP Start Time (ACUTE ONLY): 1003 -SLP Stop Time (ACUTE ONLY): 1012 SLP Time Calculation (min) (ACUTE ONLY): 9 min Past Medical History: Past Medical History: Diagnosis Date . ALLERGIC RHINITIS 10/27/2007 . ANKLE PAIN, RIGHT 11/30/2009 . ASTHMA 10/27/2007 . BACK PAIN 10/08/2009 . BENIGN PROSTATIC HYPERTROPHY 10/27/2007 . CHRONIC OBSTRUCTIVE PULMONARY DISEASE, ACUTE EXACERBATION 11/22/2007 . COLONIC POLYPS, HX OF 10/27/2007 . COPD 10/27/2007 . CORONARY ARTERY DISEASE 10/27/2007 . Dementia ([place]) 01/13/2013 . DEPRESSION 11/30/2009 . FATIGUE 11/30/2009 . Hemoptysis 04/24/2009 . HYPERLIPIDEMIA 04/03/2007 . HYPERTENSION 04/03/2007 . MYALGIA 02/04/2008 . Obstructive chronic bronchitis without exacerbation ([place])  . Pain in Soft Tissues of Limb 02/04/2008 . PARESTHESIA 09/02/2010 . PERIPHERAL EDEMA 11/16/2009 . Peripheral neuropathy 03/08/2012 . PERIPHERAL VASCULAR DISEASE 10/27/2007 . 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 . RASH-NONVESICULAR 10/27/2007 . SCIATICA, LEFT 11/08/2009 . SHOULDER PAIN,  LEFT 06/21/2009 . SINUSITIS- ACUTE-NOS 10/08/2009 . VITAMIN D DEFICIENCY 06/04/2010 Past Surgical History: Past Surgical History: Procedure Laterality Date . None   HPI: Matthew Gray is a 85 y.o. male with medical history significant of dementia, COPD, asthma, myalgia, coronary artery disease, hyperlipidemia, attends adult daycare who presents to the emergency room with fever and cough. Chest x-ray shows left lower  lobe pneumonia.  No data recorded Assessment / Plan / Recommendation CHL IP CLINICAL IMPRESSIONS 11/04/2018 Clinical Impression Pt exhibited min-mild oral dysphagia with normal pharyngeal phase swallow function. Oral phase marked by minimal premature spill with thin into pharynx momentarily before swallow initiated. He demonstrated difficulty coordinating pill and thin liquid with pill remaining in oral cavity and requiring additional sip to transit. Sensation and motor components of swallow were within normal limits without penetration or aspiration. Pt coughed throughout evalutuation without material in vestibule or below vocal cords as aformentioned. Coughing due to pneumonia versus aspiration during today's assessment. Recommend continue regular texture, thin liquids and pills whole in applesauce. No further ST needed.  SLP Visit Diagnosis Dysphagia, oral phase (R13.11) Attention and concentration deficit following -- Frontal lobe and executive function deficit following -- Impact on safety and function Mild aspiration risk   CHL IP TREATMENT RECOMMENDATION 11/04/2018 Treatment Recommendations No treatment recommended at this time   Prognosis 11/03/2018 Prognosis for Safe Diet Advancement (No Data) Barriers to Reach Goals Cognitive deficits Barriers/Prognosis Comment -- CHL IP DIET RECOMMENDATION 11/04/2018 SLP Diet Recommendations Regular solids;Thin liquid Liquid Administration via Cup;Straw Medication Administration Whole meds with puree Compensations Slow rate;Small sips/bites Postural Changes Seated  upright at 90 degrees   CHL IP OTHER RECOMMENDATIONS 11/04/2018 Recommended Consults -- Oral Care Recommendations Oral care BID Other Recommendations --   CHL IP FOLLOW UP RECOMMENDATIONS 11/04/2018 Follow up Recommendations None   CHL IP FREQUENCY AND DURATION 11/03/2018 Speech Therapy Frequency (ACUTE ONLY) min 2x/week Treatment Duration 2 weeks      CHL IP ORAL PHASE 11/04/2018 Oral Phase Impaired Oral - Pudding Teaspoon -- Oral - Pudding Cup -- Oral - Honey Teaspoon -- Oral - Honey Cup -- Oral - Nectar Teaspoon -- Oral - Nectar Cup -- Oral - Nectar Straw -- Oral - Thin Teaspoon -- Oral - Thin Cup Premature spillage Oral - Thin Straw -- Oral - Puree -- Oral - Mech Soft -- Oral - Regular WFL Oral - Multi-Consistency -- Oral - Pill Reduced posterior propulsion Oral Phase - Comment --  CHL IP PHARYNGEAL PHASE 11/04/2018 Pharyngeal Phase WFL Pharyngeal- Pudding Teaspoon -- Pharyngeal -- Pharyngeal- Pudding Cup -- Pharyngeal -- Pharyngeal- Honey Teaspoon -- Pharyngeal -- Pharyngeal- Honey Cup -- Pharyngeal -- Pharyngeal- Nectar Teaspoon -- Pharyngeal -- Pharyngeal- Nectar Cup -- Pharyngeal -- Pharyngeal- Nectar Straw -- Pharyngeal -- Pharyngeal- Thin Teaspoon -- Pharyngeal -- Pharyngeal- Thin Cup -- Pharyngeal -- Pharyngeal- Thin Straw -- Pharyngeal -- Pharyngeal- Puree -- Pharyngeal -- Pharyngeal- Mechanical Soft -- Pharyngeal -- Pharyngeal- Regular -- Pharyngeal -- Pharyngeal- Multi-consistency -- Pharyngeal -- Pharyngeal- Pill -- Pharyngeal -- Pharyngeal Comment --  CHL IP CERVICAL ESOPHAGEAL PHASE 11/04/2018 Cervical Esophageal Phase WFL Pudding Teaspoon -- Pudding Cup -- Honey Teaspoon -- Honey Cup -- Nectar Teaspoon -- Nectar Cup -- Nectar Straw -- Thin Teaspoon -- Thin Cup -- Thin Straw -- Puree -- Mechanical Soft -- Regular -- Multi-consistency -- Pill -- Cervical Esophageal Comment -- Houston Siren 11/04/2018, 12:42 PM  [name] Litaker M.Ed Actor Pager [phone] Office  [phone]              Subjective: Seen and examined at bedside and was feeling better.  Denies chest pain, lightheadedness or dizziness. No nasuea or vomiting. States his cough and breathing is better. No other concerns or complaints at this time and ready to go home.  Discharge Exam: Vitals:   11/05/18 0818 11/05/18 1214  BP:  125/87  Pulse:  68  Resp:    Temp:    SpO2: 96%    Vitals:   11/05/18 0338 11/05/18 0728 11/05/18 0818 11/05/18 1214  BP: (!) 151/99 (!) 153/95  125/87  Pulse: 67 67  68  Resp:  17    Temp:  97.8 F (36.6 C)    TempSrc:  Oral    SpO2:  96% 96%   Weight:      Height:       General: Pt is alert, awake, not in acute distress Cardiovascular: RRR, S1/S2 +, no rubs, no gallops Respiratory: Diminished bilaterally, no wheezing, no rhonchi Abdominal: Soft, NT, ND, bowel sounds + Extremities: no edema, no cyanosis  The results of significant diagnostics from this hospitalization (including imaging, microbiology, ancillary and laboratory) are listed below for reference.    Microbiology: Recent Results (from the past 240 hour(s))  Culture, blood (Routine x 2)     Status: None (Preliminary result)   Collection Time: 11/02/18  1:20 PM  Result Value Ref Range Status   Specimen Description BLOOD LEFT ARM  Final   Special Requests   Final    BOTTLES DRAWN AEROBIC AND ANAEROBIC Blood Culture adequate volume   Culture   Final    NO GROWTH 2 DAYS Performed at [facility], [address], [place], [place] [address]    Report Status PENDING  Incomplete  Culture, blood (Routine x 2)     Status: None (Preliminary result)   Collection Time: 11/02/18  1:55 PM  Result Value Ref Range Status   Specimen Description BLOOD RIGHT FOREARM  Final   Special Requests   Final    BOTTLES DRAWN AEROBIC AND ANAEROBIC Blood Culture adequate volume   Culture   Final    NO GROWTH 2 DAYS Performed at [facility], [place] [address], [place], [place] [address]    Report  Status PENDING  Incomplete  Culture, sputum-assessment     Status: None   Collection Time: 11/03/18  5:27 AM  Result Value Ref Range Status   Specimen Description SPUTUM  Final   Special Requests NONE  Final   Sputum evaluation   Final    THIS SPECIMEN IS ACCEPTABLE FOR SPUTUM CULTURE Performed at [facility], [address] [place], [place] [address]    Report Status 11/03/2018 FINAL  Final  Culture, respiratory     Status: None   Collection Time: 11/03/18  5:27 AM  Result Value Ref Range Status   Specimen Description SPUTUM  Final   Special Requests NONE Reflexed from [id number]  Final   Gram Stain   Final    RARE WBC PRESENT, PREDOMINANTLY PMN FEW GRAM POSITIVE COCCI IN PAIRS FEW GRAM NEGATIVE RODS FEW GRAM POSITIVE RODS    Culture   Final    MODERATE Consistent with normal respiratory flora. Performed at [facility], [place] [address] [place], [place] [address]    Report Status 11/05/2018 FINAL  Final    Labs: BNP (last 3 results) No results for input(s): BNP in the last 8760 hours. Basic Metabolic Panel: Recent Labs  Lab 11/02/18 1318 11/03/18 0932 11/04/18 0255 11/05/18 0929  NA 137 138 138 140  K 4.4 4.0 3.6 3.6  CL 100 102 104 104  CO2 28 25 27 28   GLUCOSE 104* 114* 93 104*  BUN 19 18 19 16   CREATININE 1.23 1.28* 1.13 1.21  CALCIUM 9.1 9.0 8.3* 8.4*  MG  --  1.8 1.7  1.8  PHOS  --  3.0 3.5 3.5   Liver Function Tests: Recent Labs  Lab 11/02/18 1318 11/03/18 0932 11/04/18 0255 11/05/18 0929  AST 21 18 15 18   ALT 14 11 12 12   ALKPHOS 74 70 54 56  BILITOT 1.1 1.4* 0.7 0.5  PROT 7.0 6.5 5.7* 5.8*  ALBUMIN 3.3* 2.9* 2.5* 2.5*   No results for input(s): LIPASE, AMYLASE in the last 168 hours. No results for input(s): AMMONIA in the last 168 hours. CBC: Recent Labs  Lab 11/02/18 1318 11/03/18 0932 11/04/18 0255 11/05/18 0929  WBC 11.7* 13.3* 9.3 7.1  NEUTROABS 11.1* 12.2* 8.0* 5.9  HGB 14.7 13.9 12.9* 12.9*  HCT 47.1 45.0 40.7 41.9   MCV 83.1 83.5 82.1 83.3  PLT 180 147* 144* 151   Cardiac Enzymes: No results for input(s): CKTOTAL, CKMB, CKMBINDEX, TROPONINI in the last 168 hours. BNP: Invalid input(s): POCBNP CBG: No results for input(s): GLUCAP in the last 168 hours. D-Dimer No results for input(s): DDIMER in the last 72 hours. Hgb A1c Recent Labs    11/04/18 0255  HGBA1C 5.4   Lipid Profile No results for input(s): CHOL, HDL, LDLCALC, TRIG, CHOLHDL, LDLDIRECT in the last 72 hours. Thyroid function studies No results for input(s): TSH, T4TOTAL, T3FREE, THYROIDAB in the last 72 hours.  Invalid input(s): FREET3 Anemia work up No results for input(s): VITAMINB12, FOLATE, FERRITIN, TIBC, IRON, RETICCTPCT in the last 72 hours. Urinalysis    Component Value Date/Time   COLORURINE YELLOW 11/02/2018 1538   APPEARANCEUR HAZY (A) 11/02/2018 1538   LABSPEC 1.013 11/02/2018 1538   PHURINE 6.0 11/02/2018 1538   GLUCOSEU NEGATIVE 11/02/2018 1538   GLUCOSEU NEGATIVE 10/14/2018 1143   HGBUR SMALL (A) 11/02/2018 1538   BILIRUBINUR NEGATIVE 11/02/2018 1538   BILIRUBINUR small (A) 11/02/2015 1712   KETONESUR NEGATIVE 11/02/2018 1538   PROTEINUR 100 (A) 11/02/2018 1538   UROBILINOGEN 0.2 10/14/2018 1143   NITRITE NEGATIVE 11/02/2018 1538   LEUKOCYTESUR NEGATIVE 11/02/2018 1538   Sepsis Labs Invalid input(s): PROCALCITONIN,  WBC,  LACTICIDVEN Microbiology Recent Results (from the past 240 hour(s))  Culture, blood (Routine x 2)     Status: None (Preliminary result)   Collection Time: 11/02/18  1:20 PM  Result Value Ref Range Status   Specimen Description BLOOD LEFT ARM  Final   Special Requests   Final    BOTTLES DRAWN AEROBIC AND ANAEROBIC Blood Culture adequate volume   Culture   Final    NO GROWTH 2 DAYS Performed at [facility], [place] [address] [place], [place] [address]    Report Status PENDING  Incomplete  Culture, blood (Routine x 2)     Status: None (Preliminary result)   Collection Time:  11/02/18  1:55 PM  Result Value Ref Range Status   Specimen Description BLOOD RIGHT FOREARM  Final   Special Requests   Final    BOTTLES DRAWN AEROBIC AND ANAEROBIC Blood Culture adequate volume   Culture   Final    NO GROWTH 2 DAYS Performed at [facility], [place] [address] [place], [place] [address]    Report Status PENDING  Incomplete  Culture, sputum-assessment     Status: None   Collection Time: 11/03/18  5:27 AM  Result Value Ref Range Status   Specimen Description SPUTUM  Final   Special Requests NONE  Final   Sputum evaluation   Final    THIS SPECIMEN IS ACCEPTABLE FOR SPUTUM CULTURE Performed at [facility]  [facility], [place] [address], [place], [place] [address]    Report Status 11/03/2018 FINAL  Final  Culture, respiratory     Status: None   Collection Time: 11/03/18  5:27 AM  Result Value Ref Range Status   Specimen Description SPUTUM  Final   Special Requests NONE Reflexed from [id number]  Final   Gram Stain   Final    RARE WBC PRESENT, PREDOMINANTLY PMN FEW GRAM POSITIVE COCCI IN PAIRS FEW GRAM NEGATIVE RODS FEW GRAM POSITIVE RODS    Culture   Final    MODERATE Consistent with normal respiratory flora. Performed at [facility], [place] [address], [place], [place] [address]    Report Status 11/05/2018 FINAL  Final   Time coordinating discharge: 35 minutes  SIGNED:  Kerney Elbe, DO [facility] 11/05/2018, 12:46 PM Pager is on [place]  If 7PM-7AM, please contact night-coverage [email or web address] Password [email or web address]

## 2018-11-05 NOTE — Progress Notes (Signed)
Physical Therapy Treatment Patient Details Name: Matthew Gray MRN: [id number] DOB: Dec 20, 1933 Today's Date: 11/05/2018    History of Present Illness Matthew Gray is a 85 y.o. male with medical history significant of dementia, COPD, asthma, myalgia, coronary artery disease, hyperlipidemia, attends adult daycare who presents to the emergency room with fever and cough. Chest x-ray shows left lower lobe pneumonia.     PT Comments    Pt received in recliner. Min guard assist for transfers and ambulation 150 feet without AD. SpO2 96% at rest and desat to 89% during ambulation. Pt returned to recliner at end of session. Daughter present in room.   Follow Up Recommendations  Home health PT     Equipment Recommendations  None recommended by PT    Recommendations for Other Services       Precautions / Restrictions Precautions Precautions: Fall    Mobility  Bed Mobility               General bed mobility comments: pt sitting in recliner upon arrival  Transfers Overall transfer level: Needs assistance Equipment used: None Transfers: Sit to/from Stand Sit to Stand: Min guard         General transfer comment: min guard for safety. No physical assist  Ambulation/Gait Ambulation/Gait assistance: Min guard Gait Distance (Feet): 150 Feet Assistive device: None Gait Pattern/deviations: Step-through pattern;Decreased stride length Gait velocity: decreased   General Gait Details: min guard assist for safety. Mildly unsteady but no physical assist needed. SpO2 96% prior to mobility. Desat to 89% during ambulation.    Stairs             Wheelchair Mobility    Modified Rankin (Stroke Patients Only)       Balance Overall balance assessment: Mild deficits observed, not formally tested                                          Cognition Arousal/Alertness: Awake/alert Behavior During Therapy: WFL for tasks assessed/performed Overall Cognitive Status:  History of cognitive impairments - at baseline                                        Exercises      General Comments General comments (skin integrity, edema, etc.): [name] daughter present during session.      Pertinent Vitals/Pain Pain Assessment: No/denies pain    Home Living                      Prior Function            PT Goals (current goals can now be found in the care plan section) Acute Rehab PT Goals Patient Stated Goal: not stated PT Goal Formulation: With patient Potential to Achieve Goals: Good Progress towards PT goals: Progressing toward goals    Frequency    Min 3X/week      PT Plan Current plan remains appropriate    Co-evaluation              AM-PAC PT "6 Clicks" Mobility   Outcome Measure  Help needed turning from your back to your side while in a flat bed without using bedrails?: None Help needed moving from lying on your back to sitting on the side of a flat bed without using  bedrails?: None Help needed moving to and from a bed to a chair (including a wheelchair)?: A Little Help needed standing up from a chair using your arms (e.g., wheelchair or bedside chair)?: A Little Help needed to walk in hospital room?: A Little Help needed climbing 3-5 steps with a railing? : A Little 6 Click Score: 20    End of Session Equipment Utilized During Treatment: Gait belt Activity Tolerance: Patient tolerated treatment well Patient left: in chair;with call bell/phone within reach;with family/visitor present Nurse Communication: Mobility status PT Visit Diagnosis: Unsteadiness on feet (R26.81)     Time: 1218-1229 PT Time Calculation (min) (ACUTE ONLY): 11 min  Charges:  $Gait Training: 8-22 mins                     Lorrin Goodell, PT  Office # [phone] Pager [phone]    [name] 11/05/2018, 1:39 PM

## 2018-11-07 LAB — CULTURE, BLOOD (ROUTINE X 2)
Culture: NO GROWTH
Culture: NO GROWTH
Special Requests: ADEQUATE
Special Requests: ADEQUATE

## 2018-11-08 ENCOUNTER — Encounter: Payer: Self-pay | Admitting: Internal Medicine

## 2018-11-08 ENCOUNTER — Other Ambulatory Visit: Payer: Self-pay

## 2018-11-08 ENCOUNTER — Ambulatory Visit (INDEPENDENT_AMBULATORY_CARE_PROVIDER_SITE_OTHER): Payer: Medicare HMO | Admitting: Internal Medicine

## 2018-11-08 VITALS — BP 160/90 | HR 73 | Temp 98.2°F | Ht 66.0 in | Wt 155.0 lb

## 2018-11-08 DIAGNOSIS — J13 Pneumonia due to Streptococcus pneumoniae: Secondary | ICD-10-CM | POA: Diagnosis not present

## 2018-11-08 DIAGNOSIS — J449 Chronic obstructive pulmonary disease, unspecified: Secondary | ICD-10-CM | POA: Diagnosis not present

## 2018-11-08 DIAGNOSIS — I10 Essential (primary) hypertension: Secondary | ICD-10-CM

## 2018-11-08 MED ORDER — PROMETHAZINE-DM 6.25-15 MG/5ML PO SYRP: 5.0000 mL | ORAL_SOLUTION | Freq: Four times a day (QID) | ORAL | 1 refills | Status: DC | PRN

## 2018-11-08 MED ORDER — ALBUTEROL SULFATE HFA 108 (90 BASE) MCG/ACT IN AERS: 2.0000 | INHALATION_SPRAY | Freq: Four times a day (QID) | RESPIRATORY_TRACT | 11 refills | Status: DC | PRN

## 2018-11-08 NOTE — Assessment & Plan Note (Signed)
BP Readings from Last 3 Encounters:  11/08/18 (!) 160/90  11/05/18 125/87  10/14/18 138/84  mild elevated today, overall stable, BP at home less per family, declines med change, and cont same tx

## 2018-11-08 NOTE — Assessment & Plan Note (Signed)
stable overall by history and exam, recent data reviewed with pt, and pt to continue medical treatment as before,  to f/u any worsening symptoms or concerns  

## 2018-11-08 NOTE — Patient Instructions (Addendum)
Please continue all other medications as before, and refills have been done if requested for the albuterol HFA inhaler and cough medicine as needed  You are given the Adult Daycare note today  Please call if you are have any worsening, to consider having the chest xray repeated  Please have the pharmacy call with any other refills you may need.  Please keep your appointments with your specialists as you may have planned  Please return in 3 months, or sooner if needed

## 2018-11-08 NOTE — Assessment & Plan Note (Signed)
Overall improved, to finish antibiotics, declines f/u cxr for now unless he gets worsening s/s, for cough med prn and daughter asks for [facility] as he does not do well enough with the nebs for some reason; ok for note to return to Adult Daycare tomorrow.

## 2018-11-08 NOTE — Progress Notes (Signed)
Subjective:    Patient ID: Matthew Gray, male    DOB: [birth year], 85 y.o.   MRN: [id number]  HPI  Here to f/u recent hospn; has significant ofdementia, COPD, coronary artery disease, hyperlipidemia who presents to the emergency room with his daughter with fever and cough. Patient is poor historian.  he developed some cough and congestion about 3 weeks ago and was treated with Tamiflu. After about a week he had another bout of cough and was given antibiotics. He has been quite lethargic and coughing all throughout. Daughter noted him having shaking and chills. He went to his adult daycare, his temperature was 101 so was sent to the hospital.  Patient himself complains of cough with mucoid sputum and occasionally yellow-tinged on it. He denies any chest pain. Wheezing present but no shortness of breath.  He was treated with Nebs and Abx and improved  .Influenza A and B via PCR was negative  -Chest physiotherapy, incentive spirometry, deep breathing exercises, sputum induction, mucolytic's and bronchodilators.Desaturated the day before yesterday with Home O2 Screen. Case Management to discuss Home Health with patient's daughter. Repeat Home O2 screen showed that the patient did not desaturate.  He was deemed medically stable to be discharged will need to follow-up with PCP and repeat chest x-ray in 3 to 6 weeks.  He will be transitioned to oral antibiotics for completion of 7 days   Blood cultures NGTD, Legionella Negative and streptococcal Pneumo antigen was POSITIVE.  Repeat CXRon day of d/c showed"Unchanged appearance of the chest with continued LEFT LOWER lung airspace disease/consolidation." Today here with daughter he lives with, overall stable to some improved, no new complaints, has some persistent dry cough and nose running on the mucinex.  No worsening fever, ST, CP, sob or wheezing Past Medical History:  Diagnosis Date  . ALLERGIC RHINITIS 10/27/2007  . ANKLE PAIN, RIGHT 11/30/2009  . ASTHMA  10/27/2007  . BACK PAIN 10/08/2009  . BENIGN PROSTATIC HYPERTROPHY 10/27/2007  . CHRONIC OBSTRUCTIVE PULMONARY DISEASE, ACUTE EXACERBATION 11/22/2007  . COLONIC POLYPS, HX OF 10/27/2007  . COPD 10/27/2007  . CORONARY ARTERY DISEASE 10/27/2007  . Dementia ([place]) 01/13/2013  . DEPRESSION 11/30/2009  . FATIGUE 11/30/2009  . Hemoptysis 04/24/2009  . HYPERLIPIDEMIA 04/03/2007  . HYPERTENSION 04/03/2007  . MYALGIA 02/04/2008  . Obstructive chronic bronchitis without exacerbation ([place])   . Pain in Soft Tissues of Limb 02/04/2008  . PARESTHESIA 09/02/2010  . PERIPHERAL EDEMA 11/16/2009  . Peripheral neuropathy 03/08/2012  . PERIPHERAL VASCULAR DISEASE 10/27/2007  . 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 RASH-NONVESICULAR 10/27/2007  . SCIATICA, LEFT 11/08/2009  . SHOULDER PAIN, LEFT 06/21/2009  . SINUSITIS- ACUTE-NOS 10/08/2009  . VITAMIN D DEFICIENCY 06/04/2010   Past Surgical History:  Procedure Laterality Date  . None      reports that he has never smoked. He has never used smokeless tobacco. He reports that he does not drink alcohol or use drugs. family history includes Alcohol abuse in an other family member; Anuerysm in his brother; Cancer in his father, mother, and another family member; Hypertension in his sister; Stroke in an other family member. Allergies  Allergen Reactions  . Atorvastatin Other (See Comments)    REACTION: myalgias  . Simvastatin Other (See Comments)    REACTION: myalgias  . Zetia [Ezetimibe] Other (See Comments)    weakness  . Ace Inhibitors Other (See Comments)    unknown  . Penicillins Other (See Comments)    Unknown;  Tolerated Ceftriaxone 10/2018   Current  Outpatient Medications on File Prior to Visit  Medication Sig Dispense Refill  . acetaminophen (TYLENOL) 325 MG tablet Take 2 tablets (650 mg total) by mouth every 6 (six) hours as needed for mild pain (or Fever >/= 101). 30 tablet 0  . amLODipine (NORVASC) 5 MG tablet Take 1 tablet (5 mg total) by mouth daily. 90 tablet 3   . aspirin EC 81 MG tablet Take 1 tablet (81 mg total) by mouth daily. 90 tablet 11  . cefdinir (OMNICEF) 300 MG capsule Take 1 capsule (300 mg total) by mouth every 12 (twelve) hours for 4 days. 8 capsule 0  . cetirizine (ZYRTEC) 10 MG tablet Take 1 tablet (10 mg total) by mouth daily. 30 tablet 11  . donepezil (ARICEPT) 10 MG tablet Take 1 tablet (10 mg total) by mouth daily at 2 am. 90 tablet 0  . guaiFENesin (MUCINEX) 600 MG 12 hr tablet Take 2 tablets (1,200 mg total) by mouth 2 (two) times daily. 14 tablet 0  . ipratropium-albuterol (DUONEB) 0.5-2.5 (3) MG/3ML SOLN Take 3 mLs by nebulization every 6 (six) hours as needed. 360 mL 0  . Multiple Vitamins-Minerals (ALIVE MENS ENERGY) TABS Take 1 tablet by mouth daily.    . QUEtiapine (SEROQUEL) 25 MG tablet 2 tab by mouth in the AM, and 3 tab in the PM (Patient taking differently: Take 50-75 mg by mouth 2 (two) times daily. 2 tab (50mg ) by mouth in the AM, and 3 tab (75mg ) in the PM) 150 tablet 5   No current facility-administered medications on file prior to visit.    Review of Systems  Constitutional: Negative for other unusual diaphoresis or sweats HENT: Negative for ear discharge or swelling Eyes: Negative for other worsening visual disturbances Respiratory: Negative for stridor or other swelling  Gastrointestinal: Negative for worsening distension or other blood Genitourinary: Negative for retention or other urinary change Musculoskeletal: Negative for other MSK pain or swelling Skin: Negative for color change or other new lesions Neurological: Negative for worsening tremors and other numbness  Psychiatric/Behavioral: Negative for worsening agitation or other fatigue All other system neg per pt    Objective:   Physical Exam BP (!) 160/90 (BP Location: Left Arm, Patient Position: Sitting, Cuff Size: Normal)   Pulse 73   Temp 98.2 F (36.8 C) (Oral)   Ht 5\' 6"  (1.676 m)   Wt 155 lb (70.3 kg)   SpO2 97%   BMI 25.02 kg/m  VS  noted,  Constitutional: Pt appears in NAD HENT: Head: NCAT.  Right Ear: External ear normal.  Left Ear: External ear normal.  Eyes: . Pupils are equal, round, and reactive to light. Conjunctivae and EOM are normal Nose: without d/c or deformity Neck: Neck supple. Gross normal ROM Cardiovascular: Normal rate and regular rhythm.   Pulmonary/Chest: Effort normal and breath sounds decrease left base somewhat but overall without rales or wheezing.  Neurological: Pt is alert. At baseline orientation, motor grossly intact Skin: Skin is warm. No rashes, other new lesions, no LE edema Psychiatric: Pt behavior is normal without agitation  No other exam findings  Lab Results  Component Value Date   WBC 7.1 11/05/2018   HGB 12.9 (L) 11/05/2018   HCT 41.9 11/05/2018   PLT 151 11/05/2018   GLUCOSE 104 (H) 11/05/2018   CHOL 207 (H) 10/14/2018   TRIG 93.0 10/14/2018   HDL 53.40 10/14/2018   LDLDIRECT 131.5 06/24/2006   LDLCALC 135 (H) 10/14/2018   ALT 12 11/05/2018   AST  18 11/05/2018   NA 140 11/05/2018   K 3.6 11/05/2018   CL 104 11/05/2018   CREATININE 1.21 11/05/2018   BUN 16 11/05/2018   CO2 28 11/05/2018   TSH 1.27 10/14/2018   PSA 1.07 03/08/2012   INR 1.0 11/02/2018   HGBA1C 5.4 11/04/2018       Assessment & Plan:

## 2018-11-22 ENCOUNTER — Ambulatory Visit: Payer: Medicare HMO | Admitting: Internal Medicine

## 2018-11-29 ENCOUNTER — Other Ambulatory Visit: Payer: Self-pay | Admitting: Internal Medicine

## 2018-12-15 ENCOUNTER — Other Ambulatory Visit: Payer: Self-pay | Admitting: Internal Medicine

## 2018-12-27 ENCOUNTER — Other Ambulatory Visit: Payer: Self-pay | Admitting: Internal Medicine

## 2019-01-18 ENCOUNTER — Other Ambulatory Visit: Payer: Self-pay

## 2019-01-18 MED ORDER — DONEPEZIL HCL 10 MG PO TABS: ORAL_TABLET | ORAL | 1 refills | Status: DC

## 2019-01-18 MED ORDER — AMLODIPINE BESYLATE 5 MG PO TABS: 5.0000 mg | ORAL_TABLET | Freq: Every day | ORAL | 1 refills | Status: DC

## 2019-01-28 ENCOUNTER — Telehealth: Payer: Self-pay | Admitting: Internal Medicine

## 2019-01-28 MED ORDER — QUETIAPINE FUMARATE 25 MG PO TABS: ORAL_TABLET | ORAL | 0 refills | Status: DC

## 2019-01-28 NOTE — Telephone Encounter (Signed)
Copied from [place] [phone]. Topic: Quick Communication - Rx Refill/Question >> Jan 28, 2019  1:33 PM Rayann Heman wrote: Medication:QUEtiapine (SEROQUEL) 25 MG tablet [[id number]]  pt daughter called and stated that pt will run out of medication soon but needs just enough sent in to a local pharmacy until the mail order can deliever medication. Pt would need this RX sent to both  ([place] [phone] (Phone) [phone] [facility]  [place], [place] [phone] (Phone) [phone] (Fax)

## 2019-01-28 NOTE — Telephone Encounter (Signed)
Done erx 

## 2019-02-07 ENCOUNTER — Encounter: Payer: Self-pay | Admitting: Internal Medicine

## 2019-02-07 ENCOUNTER — Ambulatory Visit (INDEPENDENT_AMBULATORY_CARE_PROVIDER_SITE_OTHER): Payer: Medicare HMO | Admitting: Internal Medicine

## 2019-02-07 ENCOUNTER — Other Ambulatory Visit: Payer: Self-pay

## 2019-02-07 VITALS — BP 148/90 | HR 77 | Temp 98.1°F | Ht 66.0 in | Wt 150.0 lb

## 2019-02-07 DIAGNOSIS — F0281 Dementia in other diseases classified elsewhere with behavioral disturbance: Secondary | ICD-10-CM

## 2019-02-07 DIAGNOSIS — J449 Chronic obstructive pulmonary disease, unspecified: Secondary | ICD-10-CM | POA: Diagnosis not present

## 2019-02-07 DIAGNOSIS — I10 Essential (primary) hypertension: Secondary | ICD-10-CM | POA: Diagnosis not present

## 2019-02-07 DIAGNOSIS — Z0000 Encounter for general adult medical examination without abnormal findings: Secondary | ICD-10-CM | POA: Diagnosis not present

## 2019-02-07 DIAGNOSIS — G309 Alzheimer's disease, unspecified: Secondary | ICD-10-CM

## 2019-02-07 MED ORDER — QUETIAPINE FUMARATE 25 MG PO TABS: ORAL_TABLET | ORAL | 1 refills | Status: DC

## 2019-02-07 NOTE — Progress Notes (Signed)
Subjective:    Patient ID: Matthew Gray, male    DOB: [birth year], 85 y.o.   MRN: [id number]  HPI  Here for wellness and f/u with family who gives the hx;  Overall doing ok;  Pt denies Chest pain, worsening SOB, DOE, wheezing, orthopnea, PND, worsening LE edema, palpitations, dizziness or syncope.  Pt denies neurological change such as new headache, facial or extremity weakness.  Pt denies polydipsia, polyuria, or low sugar symptoms. Pt states overall good compliance with treatment and medications, good tolerability, and has been trying to follow appropriate diet.  Pt denies worsening depressive symptoms, suicidal ideation or panic. No fever, night sweats, wt loss, loss of appetite, or other constitutional symptoms.  Pt states good ability with ADL's, has low fall risk, home safety reviewed and adequate, no other significant changes in hearing or vision, and not active with exercise.  .Dementia overall stable symptomatically with gradual worsening at best, and not assoc with behavioral changes such as hallucinations, paranoia, or agitation.  Adult daycare still closed.  Here with daughter, reluctant to increased BP med.  Has some sedation in the am after seroquel so only wants to take one pill, then 3 in the PM.   Past Medical History:  Diagnosis Date  . ALLERGIC RHINITIS 10/27/2007  . ANKLE PAIN, RIGHT 11/30/2009  . ASTHMA 10/27/2007  . BACK PAIN 10/08/2009  . BENIGN PROSTATIC HYPERTROPHY 10/27/2007  . CHRONIC OBSTRUCTIVE PULMONARY DISEASE, ACUTE EXACERBATION 11/22/2007  . COLONIC POLYPS, HX OF 10/27/2007  . COPD 10/27/2007  . CORONARY ARTERY DISEASE 10/27/2007  . Dementia ([place]) 01/13/2013  . DEPRESSION 11/30/2009  . FATIGUE 11/30/2009  . Hemoptysis 04/24/2009  . HYPERLIPIDEMIA 04/03/2007  . HYPERTENSION 04/03/2007  . MYALGIA 02/04/2008  . Obstructive chronic bronchitis without exacerbation ([place])   . Pain in Soft Tissues of Limb 02/04/2008  . PARESTHESIA 09/02/2010  . PERIPHERAL EDEMA 11/16/2009  . Peripheral  neuropathy 03/08/2012  . PERIPHERAL VASCULAR DISEASE 10/27/2007  . 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 RASH-NONVESICULAR 10/27/2007  . SCIATICA, LEFT 11/08/2009  . SHOULDER PAIN, LEFT 06/21/2009  . SINUSITIS- ACUTE-NOS 10/08/2009  . VITAMIN D DEFICIENCY 06/04/2010   Past Surgical History:  Procedure Laterality Date  . None      reports that he has never smoked. He has never used smokeless tobacco. He reports that he does not drink alcohol or use drugs. family history includes Alcohol abuse in an other family member; Anuerysm in his brother; Cancer in his father, mother, and another family member; Hypertension in his sister; Stroke in an other family member. Allergies  Allergen Reactions  . Atorvastatin Other (See Comments)    REACTION: myalgias  . Simvastatin Other (See Comments)    REACTION: myalgias  . Zetia [Ezetimibe] Other (See Comments)    weakness  . Ace Inhibitors Other (See Comments)    unknown  . Penicillins Other (See Comments)    Unknown;  Tolerated Ceftriaxone 10/2018   Current Outpatient Medications on File Prior to Visit  Medication Sig Dispense Refill  . acetaminophen (TYLENOL) 325 MG tablet Take 2 tablets (650 mg total) by mouth every 6 (six) hours as needed for mild pain (or Fever >/= 101). 30 tablet 0  . albuterol (PROVENTIL HFA;VENTOLIN HFA) 108 (90 Base) MCG/ACT inhaler Inhale 2 puffs into the lungs every 6 (six) hours as needed for wheezing or shortness of breath. 1 Inhaler 11  . amLODipine (NORVASC) 5 MG tablet Take 1 tablet (5 mg total) by mouth daily. 90 tablet 1  .  aspirin EC 81 MG tablet Take 1 tablet (81 mg total) by mouth daily. 90 tablet 11  . cetirizine (ZYRTEC) 10 MG tablet Take 1 tablet (10 mg total) by mouth daily. 30 tablet 11  . donepezil (ARICEPT) 10 MG tablet TAKE 1 TABLET BY MOUTH DAILY AT 2AM 90 tablet 1  . guaiFENesin (MUCINEX) 600 MG 12 hr tablet Take 2 tablets (1,200 mg total) by mouth 2 (two) times daily. 14 tablet 0  .  ipratropium-albuterol (DUONEB) 0.5-2.5 (3) MG/3ML SOLN Take 3 mLs by nebulization every 6 (six) hours as needed. 360 mL 0  . Multiple Vitamins-Minerals (ALIVE MENS ENERGY) TABS Take 1 tablet by mouth daily.    . promethazine-dextromethorphan (PROMETHAZINE-DM) 6.25-15 MG/5ML syrup Take 5 mLs by mouth 4 (four) times daily as needed for cough. 118 mL 1   No current facility-administered medications on file prior to visit.    ROS:  Constitutional: Negative for other unusual diaphoresis or sweats HENT: Negative for ear discharge or swelling Eyes: Negative for other worsening visual disturbances Respiratory: Negative for stridor or other swelling  Gastrointestinal: Negative for worsening distension or other blood Genitourinary: Negative for retention or other urinary change Musculoskeletal: Negative for other MSK pain or swelling Skin: Negative for color change or other new lesions Neurological: Negative for worsening tremors and other numbness  Psychiatric/Behavioral: Negative for worsening agitation or other fatigue All other system neg per pt     Objective:   Physical Exam BP (!) 148/90 (BP Location: Left Arm, Patient Position: Sitting, Cuff Size: Normal)   Pulse 77   Temp 98.1 F (36.7 C) (Oral)   Ht 5\' 6"  (1.676 m)   Wt 150 lb (68 kg)   SpO2 98%   BMI 24.21 kg/m  VS noted,  Constitutional: Pt is oriented to person, place, and time. Appears well-developed and well-nourished, in no significant distress and comfortable Head: Normocephalic and atraumatic  Eyes: Conjunctivae and EOM are normal. Pupils are equal, round, and reactive to light Right Ear: External ear normal without discharge Left Ear: External ear normal without discharge Nose: Nose without discharge or deformity Mouth/Throat: Oropharynx is without other ulcerations and moist  Neck: Normal range of motion. Neck supple. No JVD present. No tracheal deviation present or significant neck LA or mass Cardiovascular: Normal  rate, regular rhythm, normal heart sounds and intact distal pulses.   Pulmonary/Chest: WOB normal and breath sounds without rales or wheezing  Abdominal: Soft. Bowel sounds are normal. NT. No HSM  Musculoskeletal: Normal range of motion. Exhibits no edema Lymphadenopathy: Has no other cervical adenopathy.  Neurological: Pt is alert and oriented to person, place, and time. Pt has normal reflexes. No cranial nerve deficit. Motor grossly intact, Gait intact Skin: Skin is warm and dry. No rash noted or new ulcerations Psychiatric:  Has normal mood and affect. Behavior is normal without agitation No other exam findings  Lab Results  Component Value Date   WBC 7.1 11/05/2018   HGB 12.9 (L) 11/05/2018   HCT 41.9 11/05/2018   PLT 151 11/05/2018   GLUCOSE 104 (H) 11/05/2018   CHOL 207 (H) 10/14/2018   TRIG 93.0 10/14/2018   HDL 53.40 10/14/2018   LDLDIRECT 131.5 06/24/2006   LDLCALC 135 (H) 10/14/2018   ALT 12 11/05/2018   AST 18 11/05/2018   NA 140 11/05/2018   K 3.6 11/05/2018   CL 104 11/05/2018   CREATININE 1.21 11/05/2018   BUN 16 11/05/2018   CO2 28 11/05/2018   TSH 1.27 10/14/2018  PSA 1.07 03/08/2012   INR 1.0 11/02/2018   HGBA1C 5.4 11/04/2018       Assessment & Plan:

## 2019-02-07 NOTE — Patient Instructions (Addendum)
Ok to decrease the seroquel to one in the am, and 3 in the PM  Please continue all other medications as before, and refills have been done if requested.  Please have the pharmacy call with any other refills you may need.  Please continue your efforts at being more active, low cholesterol diet, and weight control.  You are otherwise up to date with prevention measures today.  Please keep your appointments with your specialists as you may have planned  Please return in 6 months, or sooner if needed

## 2019-02-08 ENCOUNTER — Encounter: Payer: Self-pay | Admitting: Internal Medicine

## 2019-02-08 NOTE — Assessment & Plan Note (Signed)
Elevated today, family does not want meds changed, cont to f/u BP at home

## 2019-02-08 NOTE — Assessment & Plan Note (Signed)

## 2019-02-08 NOTE — Assessment & Plan Note (Signed)
stable overall by history and exam, recent data reviewed with pt, and pt to continue medical treatment as before,  to f/u any worsening symptoms or concerns  

## 2019-03-14 ENCOUNTER — Other Ambulatory Visit: Payer: Self-pay | Admitting: Internal Medicine

## 2019-04-22 ENCOUNTER — Ambulatory Visit (INDEPENDENT_AMBULATORY_CARE_PROVIDER_SITE_OTHER): Payer: Medicare HMO | Admitting: Internal Medicine

## 2019-04-22 DIAGNOSIS — J4520 Mild intermittent asthma, uncomplicated: Secondary | ICD-10-CM | POA: Diagnosis not present

## 2019-04-22 DIAGNOSIS — J309 Allergic rhinitis, unspecified: Secondary | ICD-10-CM | POA: Diagnosis not present

## 2019-04-22 DIAGNOSIS — F32A Depression, unspecified: Secondary | ICD-10-CM

## 2019-04-22 DIAGNOSIS — F329 Major depressive disorder, single episode, unspecified: Secondary | ICD-10-CM | POA: Diagnosis not present

## 2019-04-22 MED ORDER — PREDNISONE 10 MG PO TABS: ORAL_TABLET | ORAL | 0 refills | Status: DC

## 2019-04-22 MED ORDER — TRIAMCINOLONE ACETONIDE 55 MCG/ACT NA AERO: 2.0000 | INHALATION_SPRAY | Freq: Every day | NASAL | 12 refills | Status: DC

## 2019-04-22 NOTE — Progress Notes (Signed)
Patient ID: Matthew Gray, male   DOB: [birth year], 85 y.o.   MRN: [id number]  Virtual Visit via Video Note  I connected with Matthew Gray on 04/22/19 at  1:20 PM EDT by a video enabled telemedicine application and verified that I am speaking with the correct person using two identifiers.  Location: Patient: at home Provider: at office   I discussed the limitations of evaluation and management by telemedicine and the availability of in person appointments. The patient expressed understanding and agreed to proceed.  History of Present Illness: Here to f/u; overall doing ok,  Pt denies chest pain, increasing sob or doe, wheezing, orthopnea, PND, increased LE swelling, palpitations, dizziness or syncope.  Pt denies new neurological symptoms such as new headache, or facial or extremity weakness or numbness.  Pt denies polydipsia, polyuria, or low sugar episode.  Pt states overall good compliance with meds, Does have several wks ongoing nasal allergy symptoms with clearish congestion, itch and sneezing, without fever, pain, ST, cough, swelling or wheezing.  Denies worsening depressive symptoms, suicidal ideation, or panic Past Medical History:  Diagnosis Date  . ALLERGIC RHINITIS 10/27/2007  . ANKLE PAIN, RIGHT 11/30/2009  . ASTHMA 10/27/2007  . BACK PAIN 10/08/2009  . BENIGN PROSTATIC HYPERTROPHY 10/27/2007  . CHRONIC OBSTRUCTIVE PULMONARY DISEASE, ACUTE EXACERBATION 11/22/2007  . COLONIC POLYPS, HX OF 10/27/2007  . COPD 10/27/2007  . CORONARY ARTERY DISEASE 10/27/2007  . Dementia ([place]) 01/13/2013  . DEPRESSION 11/30/2009  . FATIGUE 11/30/2009  . Hemoptysis 04/24/2009  . HYPERLIPIDEMIA 04/03/2007  . HYPERTENSION 04/03/2007  . MYALGIA 02/04/2008  . Obstructive chronic bronchitis without exacerbation ([place])   . Pain in Soft Tissues of Limb 02/04/2008  . PARESTHESIA 09/02/2010  . PERIPHERAL EDEMA 11/16/2009  . Peripheral neuropathy 03/08/2012  . PERIPHERAL VASCULAR DISEASE 10/27/2007  . 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 RASH-NONVESICULAR 10/27/2007  . SCIATICA, LEFT 11/08/2009  . SHOULDER PAIN, LEFT 06/21/2009  . SINUSITIS- ACUTE-NOS 10/08/2009  . VITAMIN D DEFICIENCY 06/04/2010   Past Surgical History:  Procedure Laterality Date  . None      reports that he has never smoked. He has never used smokeless tobacco. He reports that he does not drink alcohol or use drugs. family history includes Alcohol abuse in an other family member; Anuerysm in his brother; Cancer in his father, mother, and another family member; Hypertension in his sister; Stroke in an other family member. Allergies  Allergen Reactions  . Atorvastatin Other (See Comments)    REACTION: myalgias  . Simvastatin Other (See Comments)    REACTION: myalgias  . Zetia [Ezetimibe] Other (See Comments)    weakness  . Ace Inhibitors Other (See Comments)    unknown  . Penicillins Other (See Comments)    Unknown;  Tolerated Ceftriaxone 10/2018   Current Outpatient Medications on File Prior to Visit  Medication Sig Dispense Refill  . acetaminophen (TYLENOL) 325 MG tablet Take 2 tablets (650 mg total) by mouth every 6 (six) hours as needed for mild pain (or Fever >/= 101). 30 tablet 0  . albuterol (PROVENTIL HFA;VENTOLIN HFA) 108 (90 Base) MCG/ACT inhaler Inhale 2 puffs into the lungs every 6 (six) hours as needed for wheezing or shortness of breath. 1 Inhaler 11  . amLODipine (NORVASC) 5 MG tablet Take 1 tablet (5 mg total) by mouth daily. 90 tablet 1  . aspirin EC 81 MG tablet Take 1 tablet (81 mg total) by mouth daily. 90 tablet 11  . cetirizine (ZYRTEC) 10 MG tablet Take 1  tablet (10 mg total) by mouth daily. 30 tablet 11  . donepezil (ARICEPT) 10 MG tablet TAKE 1 TABLET EVERY DAY  AT  2AM 90 tablet 1  . guaiFENesin (MUCINEX) 600 MG 12 hr tablet Take 2 tablets (1,200 mg total) by mouth 2 (two) times daily. 14 tablet 0  . ipratropium-albuterol (DUONEB) 0.5-2.5 (3) MG/3ML SOLN Take 3 mLs by nebulization every 6 (six) hours as needed. 360 mL 0  .  Multiple Vitamins-Minerals (ALIVE MENS ENERGY) TABS Take 1 tablet by mouth daily.    . promethazine-dextromethorphan (PROMETHAZINE-DM) 6.25-15 MG/5ML syrup Take 5 mLs by mouth 4 (four) times daily as needed for cough. 118 mL 1  . QUEtiapine (SEROQUEL) 25 MG tablet TAKE 1 TABLETS BY MOUTH IN THE MORNING, AND 3 TABLETS IN THE EVENING 360 tablet 1   No current facility-administered medications on file prior to visit.     Observations/Objective: Alert, NAD, appropriate mood and affect, resps normal, cn 2-12 intact, moves all 4s, no visible rash or swelling Lab Results  Component Value Date   WBC 7.1 11/05/2018   HGB 12.9 (L) 11/05/2018   HCT 41.9 11/05/2018   PLT 151 11/05/2018   GLUCOSE 104 (H) 11/05/2018   CHOL 207 (H) 10/14/2018   TRIG 93.0 10/14/2018   HDL 53.40 10/14/2018   LDLDIRECT 131.5 06/24/2006   LDLCALC 135 (H) 10/14/2018   ALT 12 11/05/2018   AST 18 11/05/2018   NA 140 11/05/2018   K 3.6 11/05/2018   CL 104 11/05/2018   CREATININE 1.21 11/05/2018   BUN 16 11/05/2018   CO2 28 11/05/2018   TSH 1.27 10/14/2018   PSA 1.07 03/08/2012   INR 1.0 11/02/2018   HGBA1C 5.4 11/04/2018   Assessment and Plan: See notes  Follow Up Instructions: See notes   I discussed the assessment and treatment plan with the patient. The patient was provided an opportunity to ask questions and all were answered. The patient agreed with the plan and demonstrated an understanding of the instructions.   The patient was advised to call back or seek an in-person evaluation if the symptoms worsen or if the condition fails to improve as anticipated   [name], MD

## 2019-04-24 ENCOUNTER — Encounter: Payer: Self-pay | Admitting: Internal Medicine

## 2019-04-24 NOTE — Patient Instructions (Signed)
Please take all new medication as prescribed  Please continue all other medications as before, and refills have been done if requested.  Please have the pharmacy call with any other refills you may need.  Please continue your efforts at being more active, low cholesterol diet, and weight control.  Please keep your appointments with your specialists as you may have planned    

## 2019-04-24 NOTE — Assessment & Plan Note (Signed)
Mild to mod, seasonal flare most likely, for zyrtec, nasacort and short course prednisone,  to f/u any worsening symptoms or concerns

## 2019-04-24 NOTE — Assessment & Plan Note (Signed)
stable overall by history and exam, recent data reviewed with pt, and pt to continue medical treatment as before,  to f/u any worsening symptoms or concerns  

## 2019-04-29 ENCOUNTER — Telehealth: Payer: Self-pay

## 2019-04-29 MED ORDER — PROMETHAZINE-DM 6.25-15 MG/5ML PO SYRP: 5.0000 mL | ORAL_SOLUTION | Freq: Four times a day (QID) | ORAL | 1 refills | Status: DC | PRN

## 2019-04-29 NOTE — Telephone Encounter (Signed)
Copied from [place] [id number]. Topic: General - Inquiry >> Apr 29, 2019 10:19 AM Mathis Bud wrote: Reason for CRM: Patient daughter called stating patient is coughing pretty bad this morning and would like to know if PCP can call in patient anything. Patient states medication predniSONE (DELTASONE) 10 MG tablet is not working.  Patient only has two more tablets. Patient call back [phone]

## 2019-04-29 NOTE — Telephone Encounter (Signed)
Ok for cough med refill

## 2019-04-29 NOTE — Addendum Note (Signed)
Addended by: Biagio Borg on: 04/29/2019 11:55 AM   Modules accepted: Orders

## 2019-04-29 NOTE — Telephone Encounter (Signed)
[name] daughter has been informed.  

## 2019-05-06 ENCOUNTER — Other Ambulatory Visit: Payer: Self-pay | Admitting: Internal Medicine

## 2019-05-06 MED ORDER — PROMETHAZINE-DM 6.25-15 MG/5ML PO SYRP: 5.0000 mL | ORAL_SOLUTION | Freq: Four times a day (QID) | ORAL | 1 refills | Status: DC | PRN

## 2019-05-06 NOTE — Telephone Encounter (Signed)
Requested medication (s) are due for refill today: yes  Requested medication (s) are on the active medication list: yes  Last refill:  04/29/2019  Future visit scheduled: no  Notes to clinic:  Review for refill   Requested Prescriptions  Pending Prescriptions Disp Refills   promethazine-dextromethorphan (PROMETHAZINE-DM) 6.25-15 MG/5ML syrup 118 mL 1    Sig: Take 5 mLs by mouth 4 (four) times daily as needed for cough.     Ear, Nose, and Throat:  Antitussives/Expectorants Passed - 05/06/2019 11:14 AM      Passed - Valid encounter within last 12 months    Recent Outpatient Visits          2 weeks ago Allergic rhinitis, unspecified seasonality, unspecified trigger   [place] John, James W, MD   2 months ago Preventative health care   [place] John, James W, MD   5 months ago Pneumonia of left lower lobe due to Streptococcus pneumoniae [facility])   [facility] -Georges Mouse, MD   6 months ago COPD exacerbation [facility])   [facility] -Georges Mouse, MD   9 months ago PVD (peripheral vascular disease) [facility])   [facility] -Georges Mouse, MD

## 2019-05-06 NOTE — Telephone Encounter (Signed)
Medication Refill - Medication: promethazine-dextromethorphan (PROMETHAZINE-DM) 6.25-15 MG/5ML syrup   Has the patient contacted their pharmacy? Yes.   (Agent: If no, request that the patient contact the pharmacy for the refill.) (Agent: If yes, when and what did the pharmacy advise?)  Preferred Pharmacy (with phone number or street name):  [facility] [place] - [address]  [address] [place] [address]  Phone: [phone] Fax: [phone]     Agent: Please be advised that RX refills may take up to 3 business days. We ask that you follow-up with your pharmacy.

## 2019-06-20 DIAGNOSIS — Z961 Presence of intraocular lens: Secondary | ICD-10-CM | POA: Diagnosis not present

## 2019-06-20 DIAGNOSIS — H524 Presbyopia: Secondary | ICD-10-CM | POA: Diagnosis not present

## 2019-06-20 DIAGNOSIS — H5213 Myopia, bilateral: Secondary | ICD-10-CM | POA: Diagnosis not present

## 2019-06-28 ENCOUNTER — Other Ambulatory Visit: Payer: Self-pay

## 2019-06-28 ENCOUNTER — Ambulatory Visit (INDEPENDENT_AMBULATORY_CARE_PROVIDER_SITE_OTHER): Payer: Medicare HMO | Admitting: Internal Medicine

## 2019-06-28 ENCOUNTER — Encounter: Payer: Self-pay | Admitting: Internal Medicine

## 2019-06-28 VITALS — BP 140/90 | HR 70 | Temp 97.9°F | Ht 66.0 in | Wt 142.0 lb

## 2019-06-28 DIAGNOSIS — I10 Essential (primary) hypertension: Secondary | ICD-10-CM

## 2019-06-28 DIAGNOSIS — J449 Chronic obstructive pulmonary disease, unspecified: Secondary | ICD-10-CM

## 2019-06-28 DIAGNOSIS — G309 Alzheimer's disease, unspecified: Secondary | ICD-10-CM | POA: Diagnosis not present

## 2019-06-28 DIAGNOSIS — F0281 Dementia in other diseases classified elsewhere with behavioral disturbance: Secondary | ICD-10-CM | POA: Diagnosis not present

## 2019-06-28 MED ORDER — PROMETHAZINE-DM 6.25-15 MG/5ML PO SYRP: 5.0000 mL | ORAL_SOLUTION | Freq: Four times a day (QID) | ORAL | 1 refills | Status: DC | PRN

## 2019-06-28 NOTE — Progress Notes (Signed)
Subjective:    Patient ID: Matthew Gray, male    DOB: 1934-07-23, 85 y.o.   MRN: [id number]  HPI  Here with daughter for form to be filled out for senior daycare application renewal, as last yr was somewhat of a disaster to get done per daughter.  Pt denies chest pain, increased sob or doe, wheezing, orthopnea, PND, increased LE swelling, palpitations, dizziness or syncope.  Pt denies new neurological symptoms such as new headache, or facial or extremity weakness or numbness  Dementia overall stable symptomatically, and not assoc with behavioral changes such as hallucinations, paranoia, or agitation.  Lost wt with less calories lately, walking 30 min per day.   Wt Readings from Last 3 Encounters:  06/28/19 142 lb (64.4 kg)  02/07/19 150 lb (68 kg)  11/08/18 155 lb (70.3 kg)   Past Medical History:  Diagnosis Date  . ALLERGIC RHINITIS 10/27/2007  . ANKLE PAIN, RIGHT 11/30/2009  . ASTHMA 10/27/2007  . BACK PAIN 10/08/2009  . BENIGN PROSTATIC HYPERTROPHY 10/27/2007  . CHRONIC OBSTRUCTIVE PULMONARY DISEASE, ACUTE EXACERBATION 11/22/2007  . COLONIC POLYPS, HX OF 10/27/2007  . COPD 10/27/2007  . CORONARY ARTERY DISEASE 10/27/2007  . Dementia ([place]) 01/13/2013  . DEPRESSION 11/30/2009  . FATIGUE 11/30/2009  . Hemoptysis 04/24/2009  . HYPERLIPIDEMIA 04/03/2007  . HYPERTENSION 04/03/2007  . MYALGIA 02/04/2008  . Obstructive chronic bronchitis without exacerbation ([place])   . Pain in Soft Tissues of Limb 02/04/2008  . PARESTHESIA 09/02/2010  . PERIPHERAL EDEMA 11/16/2009  . Peripheral neuropathy 03/08/2012  . PERIPHERAL VASCULAR DISEASE 10/27/2007  . 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 RASH-NONVESICULAR 10/27/2007  . SCIATICA, LEFT 11/08/2009  . SHOULDER PAIN, LEFT 06/21/2009  . SINUSITIS- ACUTE-NOS 10/08/2009  . VITAMIN D DEFICIENCY 06/04/2010   Past Surgical History:  Procedure Laterality Date  . None      reports that he has never smoked. He has never used smokeless tobacco. He reports that he does not drink  alcohol or use drugs. family history includes Alcohol abuse in an other family member; Anuerysm in his brother; Cancer in his father, mother, and another family member; Hypertension in his sister; Stroke in an other family member. Allergies  Allergen Reactions  . Atorvastatin Other (See Comments)    REACTION: myalgias  . Simvastatin Other (See Comments)    REACTION: myalgias  . Zetia [Ezetimibe] Other (See Comments)    weakness  . Ace Inhibitors Other (See Comments)    unknown  . Penicillins Other (See Comments)    Unknown;  Tolerated Ceftriaxone 10/2018   Current Outpatient Medications on File Prior to Visit  Medication Sig Dispense Refill  . acetaminophen (TYLENOL) 325 MG tablet Take 2 tablets (650 mg total) by mouth every 6 (six) hours as needed for mild pain (or Fever >/= 101). 30 tablet 0  . albuterol (PROVENTIL HFA;VENTOLIN HFA) 108 (90 Base) MCG/ACT inhaler Inhale 2 puffs into the lungs every 6 (six) hours as needed for wheezing or shortness of breath. 1 Inhaler 11  . amLODipine (NORVASC) 5 MG tablet Take 1 tablet (5 mg total) by mouth daily. 90 tablet 1  . aspirin EC 81 MG tablet Take 1 tablet (81 mg total) by mouth daily. 90 tablet 11  . donepezil (ARICEPT) 10 MG tablet TAKE 1 TABLET EVERY DAY  AT  2AM 90 tablet 1  . guaiFENesin (MUCINEX) 600 MG 12 hr tablet Take 2 tablets (1,200 mg total) by mouth 2 (two) times daily. 14 tablet 0  . ipratropium-albuterol (DUONEB) 0.5-2.5 (  3) MG/3ML SOLN Take 3 mLs by nebulization every 6 (six) hours as needed. 360 mL 0  . Multiple Vitamins-Minerals (ALIVE MENS ENERGY) TABS Take 1 tablet by mouth daily.    . predniSONE (DELTASONE) 10 MG tablet 3 tabs by mouth per day for 3 days,2tabs per day for 3 days,1tab per day for 3 days 18 tablet 0  . QUEtiapine (SEROQUEL) 25 MG tablet TAKE 1 TABLETS BY MOUTH IN THE MORNING, AND 3 TABLETS IN THE EVENING 360 tablet 1  . triamcinolone (NASACORT) 55 MCG/ACT AERO nasal inhaler Place 2 sprays into the nose daily.  1 Inhaler 12  . cetirizine (ZYRTEC) 10 MG tablet Take 1 tablet (10 mg total) by mouth daily. 30 tablet 11   No current facility-administered medications on file prior to visit.    Review of Systems  Constitutional: Negative for other unusual diaphoresis or sweats HENT: Negative for ear discharge or swelling Eyes: Negative for other worsening visual disturbances Respiratory: Negative for stridor or other swelling  Gastrointestinal: Negative for worsening distension or other blood Genitourinary: Negative for retention or other urinary change Musculoskeletal: Negative for other MSK pain or swelling Skin: Negative for color change or other new lesions Neurological: Negative for worsening tremors and other numbness  Psychiatric/Behavioral: Negative for worsening agitation or other fatigue All otherwise neg per pt      Objective:   Physical Exam BP 140/90 (BP Location: Left Arm, Patient Position: Sitting, Cuff Size: Normal)   Pulse 70   Temp 97.9 F (36.6 C) (Oral)   Ht 5\' 6"  (1.676 m)   Wt 142 lb (64.4 kg)   SpO2 97%   BMI 22.92 kg/m  VS noted,  Constitutional: Pt appears in NAD HENT: Head: NCAT.  Right Ear: External ear normal.  Left Ear: External ear normal.  Eyes: . Pupils are equal, round, and reactive to light. Conjunctivae and EOM are normal Nose: without d/c or deformity Neck: Neck supple. Gross normal ROM Cardiovascular: Normal rate and regular rhythm.   Pulmonary/Chest: Effort normal and breath sounds without rales or wheezing.  Abd:  Soft, NT, ND, + BS, no organomegaly Neurological: Pt is alert. At baseline orientation, motor grossly intact Skin: Skin is warm. No rashes, other new lesions, no LE edema Psychiatric: Pt behavior is normal without agitation  All otherwise neg per pt Lab Results  Component Value Date   WBC 7.1 11/05/2018   HGB 12.9 (L) 11/05/2018   HCT 41.9 11/05/2018   PLT 151 11/05/2018   GLUCOSE 104 (H) 11/05/2018   CHOL 207 (H) 10/14/2018    TRIG 93.0 10/14/2018   HDL 53.40 10/14/2018   LDLDIRECT 131.5 06/24/2006   LDLCALC 135 (H) 10/14/2018   ALT 12 11/05/2018   AST 18 11/05/2018   NA 140 11/05/2018   K 3.6 11/05/2018   CL 104 11/05/2018   CREATININE 1.21 11/05/2018   BUN 16 11/05/2018   CO2 28 11/05/2018   TSH 1.27 10/14/2018   PSA 1.07 03/08/2012   INR 1.0 11/02/2018   HGBA1C 5.4 11/04/2018       Assessment & Plan:

## 2019-06-28 NOTE — Assessment & Plan Note (Signed)
stable overall by history and exam, recent data reviewed with pt, and pt to continue medical treatment as before,  to f/u any worsening symptoms or concerns  

## 2019-06-28 NOTE — Assessment & Plan Note (Signed)
stable overall by history and exam, recent data reviewed with pt, and pt to continue medical treatment as before,  to f/u any worsening symptoms or concerns, form filled out for senior daycare application

## 2019-06-28 NOTE — Patient Instructions (Addendum)
Your form was filled out today  Please continue all other medications as before, and refills have been done if requested.  Please have the pharmacy call with any other refills you may need.  Please continue your efforts at being more active, low cholesterol diet, and weight control.  Please keep your appointments with your specialists as you may have planned  Please return in 3 months, or sooner if needed

## 2019-06-29 ENCOUNTER — Telehealth: Payer: Self-pay

## 2019-06-29 NOTE — Telephone Encounter (Signed)
Forms re-faxed to [phone]. I faxed all 4 pages on 06/28/19. I even called the number on the form and the lady I spoke with stated she had received the fax from [place] numerous times.

## 2019-06-29 NOTE — Telephone Encounter (Signed)
Called pt, LVM. Informed [name] that forms were faxed and verified that they were received per Erline Levine, CMA who covered for PCP on 11/3.   Copied from [place] [phone]. Topic: General - Inquiry >> Jun 29, 2019  9:21 AM Virl Axe D wrote: Reason for CRM: [name] daughter stated that the paperwork that Dr. Jenny Reichmann filled out for pt on yesterday had 4 pages and they did not receive all the paperwork. She would like to know if all papers can be faxed again today. Fax# [phone]. Please advise.

## 2019-06-29 NOTE — Telephone Encounter (Signed)
Copied from [place] [id number]. Topic: General - Inquiry >> Jun 29, 2019  9:21 AM Virl Axe D wrote: Reason for CRM: [name] daughter stated that the paperwork that Dr. Jenny Reichmann filled out for pt on yesterday had 4 pages and they did not receive all the paperwork. She would like to know if all papers can be faxed again today. Fax# [phone]. Please advise.

## 2019-07-06 NOTE — Telephone Encounter (Signed)
Daughter is calling again about the paperwork that should have been faxed.  They are still claiming that they do not have the paperwork.  Daughter is asking the office to please refax again, if at all possible.  Fax# [phone]

## 2019-07-06 NOTE — Telephone Encounter (Signed)
Forms have been faxed again.  

## 2019-07-11 ENCOUNTER — Other Ambulatory Visit: Payer: Self-pay | Admitting: Internal Medicine

## 2019-07-20 ENCOUNTER — Other Ambulatory Visit: Payer: Self-pay | Admitting: Internal Medicine

## 2019-08-04 ENCOUNTER — Ambulatory Visit: Payer: Medicare HMO | Admitting: Internal Medicine

## 2019-08-04 ENCOUNTER — Ambulatory Visit (INDEPENDENT_AMBULATORY_CARE_PROVIDER_SITE_OTHER): Payer: Medicare HMO | Admitting: Internal Medicine

## 2019-08-04 ENCOUNTER — Other Ambulatory Visit: Payer: Self-pay

## 2019-08-04 ENCOUNTER — Other Ambulatory Visit: Payer: Self-pay | Admitting: Internal Medicine

## 2019-08-04 ENCOUNTER — Encounter: Payer: Self-pay | Admitting: Internal Medicine

## 2019-08-04 ENCOUNTER — Ambulatory Visit (INDEPENDENT_AMBULATORY_CARE_PROVIDER_SITE_OTHER)
Admission: RE | Admit: 2019-08-04 | Discharge: 2019-08-04 | Disposition: A | Discharge status: Home / Self Care | Payer: Medicare HMO | Source: Ambulatory Visit | Attending: Internal Medicine | Admitting: Internal Medicine

## 2019-08-04 VITALS — BP 148/90 | HR 73 | Temp 98.2°F | Ht 66.0 in | Wt 147.0 lb

## 2019-08-04 DIAGNOSIS — R32 Unspecified urinary incontinence: Secondary | ICD-10-CM

## 2019-08-04 DIAGNOSIS — R350 Frequency of micturition: Secondary | ICD-10-CM | POA: Diagnosis not present

## 2019-08-04 DIAGNOSIS — R059 Cough, unspecified: Secondary | ICD-10-CM

## 2019-08-04 DIAGNOSIS — R05 Cough: Secondary | ICD-10-CM

## 2019-08-04 DIAGNOSIS — I10 Essential (primary) hypertension: Secondary | ICD-10-CM | POA: Diagnosis not present

## 2019-08-04 LAB — POCT URINALYSIS DIPSTICK
Bilirubin, UA: NEGATIVE
Glucose, UA: NEGATIVE
Ketones, UA: NEGATIVE
Leukocytes, UA: NEGATIVE
Nitrite, UA: NEGATIVE
Protein, UA: POSITIVE — AB
Spec Grav, UA: 1.015 (ref 1.010–1.025)
Urobilinogen, UA: 0.2 E.U./dL
pH, UA: 6.5 (ref 5.0–8.0)

## 2019-08-04 MED ORDER — PROMETHAZINE-DM 6.25-15 MG/5ML PO SYRP: 5.0000 mL | ORAL_SOLUTION | Freq: Four times a day (QID) | ORAL | 5 refills | Status: DC | PRN

## 2019-08-04 MED ORDER — TAMSULOSIN HCL 0.4 MG PO CAPS: 0.4000 mg | ORAL_CAPSULE | Freq: Every day | ORAL | 5 refills | Status: DC

## 2019-08-04 NOTE — Progress Notes (Signed)
Subjective:    Patient ID: Matthew Gray, male    DOB: Jan 16, 1934, 85 y.o.   MRN: [id number]  HPI   Here to f/u with c/o persistent scant prod cough, off colored sputum sometimes, no blood for > 6 months. Pt denies chest pain, increased sob or doe, wheezing, orthopnea, PND, increased LE swelling, palpitations, dizziness or syncope.  Also, Denies urinary symptoms such as dysuria, urgency, flank pain, hematuria or n/v, fever, chills, but does have urinary frequency ongoing > 3-6 mo and mild weak stream and occasional very small incontinence.  Pt denies new neurological symptoms such as new headache, or facial or extremity weakness or numbness   Pt denies polydipsia, polyuria Past Medical History:  Diagnosis Date  . ALLERGIC RHINITIS 10/27/2007  . ANKLE PAIN, RIGHT 11/30/2009  . ASTHMA 10/27/2007  . BACK PAIN 10/08/2009  . BENIGN PROSTATIC HYPERTROPHY 10/27/2007  . CHRONIC OBSTRUCTIVE PULMONARY DISEASE, ACUTE EXACERBATION 11/22/2007  . COLONIC POLYPS, HX OF 10/27/2007  . COPD 10/27/2007  . CORONARY ARTERY DISEASE 10/27/2007  . Dementia ([place]) 01/13/2013  . DEPRESSION 11/30/2009  . FATIGUE 11/30/2009  . Hemoptysis 04/24/2009  . HYPERLIPIDEMIA 04/03/2007  . HYPERTENSION 04/03/2007  . MYALGIA 02/04/2008  . Obstructive chronic bronchitis without exacerbation ([place])   . Pain in Soft Tissues of Limb 02/04/2008  . PARESTHESIA 09/02/2010  . PERIPHERAL EDEMA 11/16/2009  . Peripheral neuropathy 03/08/2012  . PERIPHERAL VASCULAR DISEASE 10/27/2007  . 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 RASH-NONVESICULAR 10/27/2007  . SCIATICA, LEFT 11/08/2009  . SHOULDER PAIN, LEFT 06/21/2009  . SINUSITIS- ACUTE-NOS 10/08/2009  . VITAMIN D DEFICIENCY 06/04/2010   Past Surgical History:  Procedure Laterality Date  . None      reports that he has never smoked. He has never used smokeless tobacco. He reports that he does not drink alcohol or use drugs. family history includes Alcohol abuse in an other family member; Anuerysm in his brother;  Cancer in his father, mother, and another family member; Hypertension in his sister; Stroke in an other family member. Allergies  Allergen Reactions  . Atorvastatin Other (See Comments)    REACTION: myalgias  . Simvastatin Other (See Comments)    REACTION: myalgias  . Zetia [Ezetimibe] Other (See Comments)    weakness  . Ace Inhibitors Other (See Comments)    unknown  . Penicillins Other (See Comments)    Unknown;  Tolerated Ceftriaxone 10/2018   Current Outpatient Medications on File Prior to Visit  Medication Sig Dispense Refill  . acetaminophen (TYLENOL) 325 MG tablet Take 2 tablets (650 mg total) by mouth every 6 (six) hours as needed for mild pain (or Fever >/= 101). 30 tablet 0  . albuterol (PROVENTIL HFA;VENTOLIN HFA) 108 (90 Base) MCG/ACT inhaler Inhale 2 puffs into the lungs every 6 (six) hours as needed for wheezing or shortness of breath. 1 Inhaler 11  . amLODipine (NORVASC) 5 MG tablet TAKE 1 TABLET EVERY DAY 90 tablet 1  . aspirin EC 81 MG tablet Take 1 tablet (81 mg total) by mouth daily. 90 tablet 11  . guaiFENesin (MUCINEX) 600 MG 12 hr tablet Take 2 tablets (1,200 mg total) by mouth 2 (two) times daily. 14 tablet 0  . ipratropium-albuterol (DUONEB) 0.5-2.5 (3) MG/3ML SOLN Take 3 mLs by nebulization every 6 (six) hours as needed. 360 mL 0  . Multiple Vitamins-Minerals (ALIVE MENS ENERGY) TABS Take 1 tablet by mouth daily.    . QUEtiapine (SEROQUEL) 25 MG tablet TAKE 1 TABLET BY MOUTH IN THE MORNING,  AND TAKE 3 TABLETS IN THE EVENING 360 tablet 1  . triamcinolone (NASACORT) 55 MCG/ACT AERO nasal inhaler Place 2 sprays into the nose daily. 1 Inhaler 12  . cetirizine (ZYRTEC) 10 MG tablet Take 1 tablet (10 mg total) by mouth daily. 30 tablet 11   No current facility-administered medications on file prior to visit.   Review of Systems  Constitutional: Negative for other unusual diaphoresis or sweats HENT: Negative for ear discharge or swelling Eyes: Negative for other  worsening visual disturbances Respiratory: Negative for stridor or other swelling  Gastrointestinal: Negative for worsening distension or other blood Genitourinary: Negative for retention or other urinary change Musculoskeletal: Negative for other MSK pain or swelling Skin: Negative for color change or other new lesions Neurological: Negative for worsening tremors and other numbness  Psychiatric/Behavioral: Negative for worsening agitation or other fatigue All otherwise neg per pt     Objective:   Physical Exam BP (!) 148/90 (BP Location: Left Arm, Patient Position: Sitting, Cuff Size: Normal)   Pulse 73   Temp 98.2 F (36.8 C) (Oral)   Ht 5\' 6"  (1.676 m)   Wt 147 lb (66.7 kg)   SpO2 95%   BMI 23.73 kg/m  VS noted, non toxic Constitutional: Pt appears in NAD HENT: Head: NCAT.  Right Ear: External ear normal.  Left Ear: External ear normal.  Eyes: . Pupils are equal, round, and reactive to light. Conjunctivae and EOM are normal Nose: without d/c or deformity Neck: Neck supple. Gross normal ROM Cardiovascular: Normal rate and regular rhythm.   Pulmonary/Chest: Effort normal and breath sounds without rales or wheezing.  Abd:  Soft, NT, ND, + BS, no organomegaly Neurological: Pt is alert. At baseline orientation, motor grossly intact Skin: Skin is warm. No rashes, other new lesions, no LE edema Psychiatric: Pt behavior is normal without agitation  All otherwise neg per pt   Lab Results  Component Value Date   WBC 7.1 11/05/2018   HGB 12.9 (L) 11/05/2018   HCT 41.9 11/05/2018   PLT 151 11/05/2018   GLUCOSE 104 (H) 11/05/2018   CHOL 207 (H) 10/14/2018   TRIG 93.0 10/14/2018   HDL 53.40 10/14/2018   LDLDIRECT 131.5 06/24/2006   LDLCALC 135 (H) 10/14/2018   ALT 12 11/05/2018   AST 18 11/05/2018   NA 140 11/05/2018   K 3.6 11/05/2018   CL 104 11/05/2018   CREATININE 1.21 11/05/2018   BUN 16 11/05/2018   CO2 28 11/05/2018   TSH 1.27 10/14/2018   PSA 1.07 03/08/2012     INR 1.0 11/02/2018   HGBA1C 5.4 11/04/2018   00    Assessment & Plan:

## 2019-08-04 NOTE — Patient Instructions (Signed)
Your specimen will be sent to the lab for study and culture  Please take all new medication as prescribed - the flomax (given hardcopy)  Please continue all other medications as before, and refills have been done if requested - the [place]  Please have the pharmacy call with any other refills you may need.  Please continue your efforts at being more active, low cholesterol diet, and weight control.  Please keep your appointments with your specialists as you may have planned  Please go to the XRAY Department in the Basement (go straight as you get off the elevator) for the x-ray testing  You will be contacted by phone if any changes need to be made immediately.  Otherwise, you will receive a letter about your results with an explanation, but please check with MyChart first.

## 2019-08-05 ENCOUNTER — Encounter: Payer: Self-pay | Admitting: Internal Medicine

## 2019-08-05 ENCOUNTER — Other Ambulatory Visit: Payer: Self-pay | Admitting: Internal Medicine

## 2019-08-05 DIAGNOSIS — R05 Cough: Secondary | ICD-10-CM

## 2019-08-05 DIAGNOSIS — J479 Bronchiectasis, uncomplicated: Secondary | ICD-10-CM

## 2019-08-05 DIAGNOSIS — R053 Chronic cough: Secondary | ICD-10-CM

## 2019-08-07 ENCOUNTER — Encounter: Payer: Self-pay | Admitting: Internal Medicine

## 2019-08-07 NOTE — Assessment & Plan Note (Signed)
stable overall by history and exam, recent data reviewed with pt, and pt to continue medical treatment as before,  to f/u any worsening symptoms or concerns  

## 2019-08-07 NOTE — Assessment & Plan Note (Addendum)
Chronic persistent, for cxr r/o pna or other subacute process  Note:  Total time for pt hx, exam, review of record with pt in the room, determination of diagnoses and plan for further eval and tx is > 40 min, with over 50% spent in coordination and counseling of patient including the differential dx, tx, further evaluation and other management of chronic cough, HTN, urinary incontinence and urinary frequency

## 2019-08-07 NOTE — Assessment & Plan Note (Signed)
For urine studies, r/o UTI

## 2019-08-07 NOTE — Assessment & Plan Note (Signed)
Suspect BPH related, for flomax asd,  to f/u any worsening symptoms or concerns

## 2019-08-08 ENCOUNTER — Telehealth: Payer: Self-pay

## 2019-08-08 NOTE — Telephone Encounter (Signed)
Daughter has been informed.

## 2019-08-08 NOTE — Telephone Encounter (Signed)
Yes apparently the specimen for ua nad cx was not sent to lab as intended  [facility] for patient to return to lab to have this done if he likes.  Fortunately the initial UA was neg however, but if he wants the complete test..Marland Kitchen

## 2019-08-08 NOTE — Telephone Encounter (Addendum)
[name] daughter, [name] has been informed on xray results per PCP notations.  Please advise. Lab results are showing "active". Do they need to come back to lab? It looks like we only did a POC UA in office.  >> Aug 08, 2019 10:02 AM Greggory Keen D wrote: [name] daughter called back asking if her dad's lab results and xray results were in yet.  CB#  [phone]

## 2019-08-17 NOTE — Progress Notes (Signed)
Synopsis: Referred in Dec 2020 for cough, abnormal x-ray by Biagio Borg, MD  Subjective:   PATIENT ID: Matthew Gray GENDER: male DOB: [birth year], MRN: [id number]  Chief Complaint  Patient presents with  . Consult    Patient has congestion, cough with white phlegm and rattling in chest. Patient had x-ray done 08/04/19 which was abnormal.    HPI 85 year old man with dementia presenting for evaluation of abnormal CXR. History is per daughter in room. -Longstanding cough productive of clear to yellow sputum. -Worse after eating. -Has trouble clearing secretions. -Available CTs that I see show abnormal upper and lower lung architecture.  CXR read as only issues in LLL -Has a couple ER visits and ER admissions for "pneumonia" as well - PFTs c/w asthma - Has albuterol inhaler but does not know how to use it  ROS Positive Symptoms in bold:  Constitutional fevers, chills, weight loss, fatigue, anorexia, malaise  Eyes decreased vision, double vision, eye irritation  Ears, Nose, Mouth, Throat sore throat, trouble swallowing, sinus congestion  Cardiovascular chest pain, paroxysmal nocturnal dyspnea, lower ext edema, palpitations   Respiratory SOB, cough, DOE, hemoptysis, wheezing  Gastrointestinal nausea, vomiting, diarrhea  Genitourinary burning with urination, trouble urinating  Musculoskeletal joint aches, joint swelling, back pain  Integumentary  rashes, skin lesions  Neurological focal weakness, focal numbness, trouble speaking, headaches  Psychiatric depression, anxiety, confusion  Endocrine polyuria, polydipsia, cold intolerance, heat intolerance  Hematologic abnormal bruising, abnormal bleeding, unexplained nose bleeds  Allergic/Immunologic recurrent infections, hives, swollen lymph nodes    Past Medical History:  Diagnosis Date  . ALLERGIC RHINITIS 10/27/2007  . ANKLE PAIN, RIGHT 11/30/2009  . ASTHMA 10/27/2007  . BACK PAIN 10/08/2009  . BENIGN PROSTATIC HYPERTROPHY 10/27/2007   . CHRONIC OBSTRUCTIVE PULMONARY DISEASE, ACUTE EXACERBATION 11/22/2007  . COLONIC POLYPS, HX OF 10/27/2007  . COPD 10/27/2007  . CORONARY ARTERY DISEASE 10/27/2007  . Dementia ([place]) 01/13/2013  . DEPRESSION 11/30/2009  . FATIGUE 11/30/2009  . Hemoptysis 04/24/2009  . HYPERLIPIDEMIA 04/03/2007  . HYPERTENSION 04/03/2007  . MYALGIA 02/04/2008  . Obstructive chronic bronchitis without exacerbation ([place])   . Pain in Soft Tissues of Limb 02/04/2008  . PARESTHESIA 09/02/2010  . PERIPHERAL EDEMA 11/16/2009  . Peripheral neuropathy 03/08/2012  . PERIPHERAL VASCULAR DISEASE 10/27/2007  . 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 RASH-NONVESICULAR 10/27/2007  . SCIATICA, LEFT 11/08/2009  . SHOULDER PAIN, LEFT 06/21/2009  . SINUSITIS- ACUTE-NOS 10/08/2009  . VITAMIN D DEFICIENCY 06/04/2010     Family History  Problem Relation Age of Onset  . Cancer Mother        BREAST  . Cancer Father        LUNG  . Hypertension Sister        3 sisters  . Anuerysm Brother        died with  . Stroke Other   . Cancer Other        lung and breast  . Alcohol abuse Other      Past Surgical History:  Procedure Laterality Date  . None      Social History   Socioeconomic History  . Marital status: Divorced    Spouse name: Not on file  . Number of children: 3  . Years of education: 46  . Highest education level: Not on file  Occupational History    Employer: RETIRED  Tobacco Use  . Smoking status: Never Smoker  . Smokeless tobacco: Never Used  . Tobacco comment: Quit ten years ago.  Substance and Sexual Activity  . Alcohol use: No  . Drug use: No  . Sexual activity: Never  Other Topics Concern  . Not on file  Social History Narrative   He had 12 years of education, retired from [employer] at age 17, lives alone for 4 years, has 3 children   Patient right handed.   Caffeine-  Coffee three times a week.   Social Determinants of Health   Financial Resource Strain:   . Difficulty of Paying Living Expenses:  Not on file  Food Insecurity:   . Worried About Charity fundraiser in the Last Year: Not on file  . Ran Out of Food in the Last Year: Not on file  Transportation Needs:   . Lack of Transportation (Medical): Not on file  . Lack of Transportation (Non-Medical): Not on file  Physical Activity:   . Days of Exercise per Week: Not on file  . Minutes of Exercise per Session: Not on file  Stress:   . Feeling of Stress : Not on file  Social Connections:   . Frequency of Communication with Friends and Family: Not on file  . Frequency of Social Gatherings with Friends and Family: Not on file  . Attends Religious Services: Not on file  . Active Member of Clubs or Organizations: Not on file  . Attends Archivist Meetings: Not on file  . Marital Status: Not on file  Intimate Partner Violence:   . Fear of Current or Ex-Partner: Not on file  . Emotionally Abused: Not on file  . Physically Abused: Not on file  . Sexually Abused: Not on file     Allergies  Allergen Reactions  . Atorvastatin Other (See Comments)    REACTION: myalgias  . Simvastatin Other (See Comments)    REACTION: myalgias  . Zetia [Ezetimibe] Other (See Comments)    weakness  . Ace Inhibitors Other (See Comments)    unknown  . Penicillins Other (See Comments)    Unknown;  Tolerated Ceftriaxone 10/2018     Outpatient Medications Prior to Visit  Medication Sig Dispense Refill  . acetaminophen (TYLENOL) 325 MG tablet Take 2 tablets (650 mg total) by mouth every 6 (six) hours as needed for mild pain (or Fever >/= 101). 30 tablet 0  . albuterol (PROVENTIL HFA;VENTOLIN HFA) 108 (90 Base) MCG/ACT inhaler Inhale 2 puffs into the lungs every 6 (six) hours as needed for wheezing or shortness of breath. 1 Inhaler 11  . amLODipine (NORVASC) 5 MG tablet TAKE 1 TABLET EVERY DAY 90 tablet 1  . aspirin EC 81 MG tablet Take 1 tablet (81 mg total) by mouth daily. 90 tablet 11  . donepezil (ARICEPT) 10 MG tablet TAKE 1 TABLET  EVERY DAY  AT  2AM 90 tablet 1  . guaiFENesin (MUCINEX) 600 MG 12 hr tablet Take 2 tablets (1,200 mg total) by mouth 2 (two) times daily. 14 tablet 0  . Multiple Vitamins-Minerals (ALIVE MENS ENERGY) TABS Take 1 tablet by mouth daily.    . promethazine-dextromethorphan (PROMETHAZINE-DM) 6.25-15 MG/5ML syrup Take 5 mLs by mouth 4 (four) times daily as needed for cough. 118 mL 5  . QUEtiapine (SEROQUEL) 25 MG tablet TAKE 1 TABLET BY MOUTH IN THE MORNING, AND TAKE 3 TABLETS IN THE EVENING (Patient taking differently: 25 mg. TAKE 1 TABLET BY MOUTH IN THE MORNING, AND TAKE 1 TABLET IN THE EVENING) 360 tablet 1  . tamsulosin (FLOMAX) 0.4 MG CAPS capsule Take 1 capsule (0.4  mg total) by mouth daily. 30 capsule 5  . triamcinolone (NASACORT) 55 MCG/ACT AERO nasal inhaler Place 2 sprays into the nose daily. 1 Inhaler 12  . cetirizine (ZYRTEC) 10 MG tablet Take 1 tablet (10 mg total) by mouth daily. (Patient not taking: Reported on 08/18/2019) 30 tablet 11  . ipratropium-albuterol (DUONEB) 0.5-2.5 (3) MG/3ML SOLN Take 3 mLs by nebulization every 6 (six) hours as needed. (Patient not taking: Reported on 08/18/2019) 360 mL 0   No facility-administered medications prior to visit.     Objective:  GEN: frail elderly man in NAD HEENT: MMM, trachea midline CV: RRR, ext warm PULM: Rhonci worse in left lower thorax c/w retained secretions, in no respiratory distress GI: Soft, +BS EXT: Arthritic changes of hands NEURO: Moves all 4 ext to command PSYCH: pleasantly confused SKIN: No rashes    Vitals:   08/18/19 1050  BP: 120/84  Pulse: 76  Temp: (!) 97.2 F (36.2 C)  TempSrc: Temporal  SpO2: 94%  Weight: 147 lb 12.8 oz (67 kg)  Height: 5' 6.54" (1.69 m)   94% on RA BMI Readings from Last 3 Encounters:  08/18/19 23.47 kg/m  08/04/19 23.73 kg/m  06/28/19 22.92 kg/m   Wt Readings from Last 3 Encounters:  08/18/19 147 lb 12.8 oz (67 kg)  08/04/19 147 lb (66.7 kg)  06/28/19 142 lb (64.4 kg)      CBC    Component Value Date/Time   WBC 7.1 11/05/2018 0929   RBC 5.03 11/05/2018 0929   HGB 12.9 (L) 11/05/2018 0929   HCT 41.9 11/05/2018 0929   PLT 151 11/05/2018 0929   MCV 83.3 11/05/2018 0929   MCV 81.0 11/02/2015 1601   MCH 25.6 (L) 11/05/2018 0929   MCHC 30.8 11/05/2018 0929   RDW 15.4 11/05/2018 0929   LYMPHSABS 0.6 (L) 11/05/2018 0929   MONOABS 0.4 11/05/2018 0929   EOSABS 0.2 11/05/2018 0929   BASOSABS 0.0 11/05/2018 0929   CXR fairly benign, maybe some LLL bronchiectasis  PFT 2012 Ratio 0.53, post BD 1.7L (62% pred), large BD response, borderline air trapping on lung volumes and DLCO WNL   2013 echo WNL  2017 CT abd with remarkable bilateral lower lobe infiltrates on lung fields  March 2020 MBSS with mild aspiration risk  March 2020 urine strep ag +  Assessment & Plan:  Recurrent pneumonia over past several years, evidence of ILD on available imaging.  Suspicion for chronic aspiration syndrome vs. Smoldering infection.  Hx of smoking and a asthmatic-phenotype PFT, unable to use MDI due to advanced dementia  Discussion: -Nebulizer with duonebs -Flutter valve administered by family -Sputum AFB/bacterial culture -CT chest to get better idea of lung architecture -May need re-referral to SLP if CT looks like aspiration syndrome to learn safe eating habits - If benefits from duonebs consider adding pulmicort at next visit - 4 week tele-visit with daughter/patient/midlevel   Current Outpatient Medications:  .  acetaminophen (TYLENOL) 325 MG tablet, Take 2 tablets (650 mg total) by mouth every 6 (six) hours as needed for mild pain (or Fever >/= 101)., Disp: 30 tablet, Rfl: 0 .  albuterol (PROVENTIL HFA;VENTOLIN HFA) 108 (90 Base) MCG/ACT inhaler, Inhale 2 puffs into the lungs every 6 (six) hours as needed for wheezing or shortness of breath., Disp: 1 Inhaler, Rfl: 11 .  amLODipine (NORVASC) 5 MG tablet, TAKE 1 TABLET EVERY DAY, Disp: 90 tablet, Rfl: 1 .   aspirin EC 81 MG tablet, Take 1 tablet (81 mg total) by  mouth daily., Disp: 90 tablet, Rfl: 11 .  donepezil (ARICEPT) 10 MG tablet, TAKE 1 TABLET EVERY DAY  AT  2AM, Disp: 90 tablet, Rfl: 1 .  guaiFENesin (MUCINEX) 600 MG 12 hr tablet, Take 2 tablets (1,200 mg total) by mouth 2 (two) times daily., Disp: 14 tablet, Rfl: 0 .  Multiple Vitamins-Minerals (ALIVE MENS ENERGY) TABS, Take 1 tablet by mouth daily., Disp: , Rfl:  .  promethazine-dextromethorphan (PROMETHAZINE-DM) 6.25-15 MG/5ML syrup, Take 5 mLs by mouth 4 (four) times daily as needed for cough., Disp: 118 mL, Rfl: 5 .  QUEtiapine (SEROQUEL) 25 MG tablet, TAKE 1 TABLET BY MOUTH IN THE MORNING, AND TAKE 3 TABLETS IN THE EVENING (Patient taking differently: 25 mg. TAKE 1 TABLET BY MOUTH IN THE MORNING, AND TAKE 1 TABLET IN THE EVENING), Disp: 360 tablet, Rfl: 1 .  tamsulosin (FLOMAX) 0.4 MG CAPS capsule, Take 1 capsule (0.4 mg total) by mouth daily., Disp: 30 capsule, Rfl: 5 .  triamcinolone (NASACORT) 55 MCG/ACT AERO nasal inhaler, Place 2 sprays into the nose daily., Disp: 1 Inhaler, Rfl: 12 .  cetirizine (ZYRTEC) 10 MG tablet, Take 1 tablet (10 mg total) by mouth daily. (Patient not taking: Reported on 08/18/2019), Disp: 30 tablet, Rfl: 11 .  ipratropium-albuterol (DUONEB) 0.5-2.5 (3) MG/3ML SOLN, Take 3 mLs by nebulization every 6 (six) hours as needed (for cough or shortness of breath)., Disp: 360 mL, Rfl: 3   Candee Furbish, MD [facility] 08/18/2019 11:16 AM

## 2019-08-18 ENCOUNTER — Ambulatory Visit: Payer: Medicare HMO | Admitting: Internal Medicine

## 2019-08-18 ENCOUNTER — Other Ambulatory Visit: Payer: Self-pay

## 2019-08-18 ENCOUNTER — Encounter: Payer: Self-pay | Admitting: Internal Medicine

## 2019-08-18 ENCOUNTER — Other Ambulatory Visit (INDEPENDENT_AMBULATORY_CARE_PROVIDER_SITE_OTHER): Payer: Medicare HMO

## 2019-08-18 DIAGNOSIS — R350 Frequency of micturition: Secondary | ICD-10-CM | POA: Diagnosis not present

## 2019-08-18 DIAGNOSIS — R32 Unspecified urinary incontinence: Secondary | ICD-10-CM | POA: Diagnosis not present

## 2019-08-18 DIAGNOSIS — J849 Interstitial pulmonary disease, unspecified: Secondary | ICD-10-CM | POA: Diagnosis not present

## 2019-08-18 DIAGNOSIS — J449 Chronic obstructive pulmonary disease, unspecified: Secondary | ICD-10-CM | POA: Diagnosis not present

## 2019-08-18 LAB — URINALYSIS, ROUTINE W REFLEX MICROSCOPIC
Bilirubin Urine: NEGATIVE
Ketones, ur: NEGATIVE
Leukocytes,Ua: NEGATIVE
Nitrite: NEGATIVE
Specific Gravity, Urine: 1.02 (ref 1.000–1.030)
Total Protein, Urine: 100 — AB
Urine Glucose: NEGATIVE
Urobilinogen, UA: 0.2 (ref 0.0–1.0)
pH: 6 (ref 5.0–8.0)

## 2019-08-18 MED ORDER — IPRATROPIUM-ALBUTEROL 0.5-2.5 (3) MG/3ML IN SOLN: 3.0000 mL | Freq: Four times a day (QID) | RESPIRATORY_TRACT | 3 refills | Status: DC | PRN

## 2019-08-18 NOTE — Patient Instructions (Addendum)
There are some changes of chronic lung disease that may be due to either  (A) infection (B) inflammation or  (C) aspiration (things going down wrong tube).  A CT scan of lungs will help [place] figure out how much is involved.  A sputum sample can help [place] decide if there is something I can treat.  In meantime, use (1) flutter valve to help with sputum clearance (2) nebulizer machine with nebulization medicines to use as needed at home  We will touch base in 4 weeks to review sputum and CT results as well as see how Matthew Gray is doing.  He may need to see a swallowing expert if we cannot find anything obvious on above tests.  Have a good day!

## 2019-08-19 LAB — URINE CULTURE
MICRO NUMBER:: 1230370
SPECIMEN QUALITY:: ADEQUATE

## 2019-08-21 ENCOUNTER — Encounter: Payer: Self-pay | Admitting: Internal Medicine

## 2019-08-24 ENCOUNTER — Telehealth: Payer: Self-pay | Admitting: Internal Medicine

## 2019-08-24 DIAGNOSIS — J449 Chronic obstructive pulmonary disease, unspecified: Secondary | ICD-10-CM | POA: Diagnosis not present

## 2019-08-24 NOTE — Telephone Encounter (Signed)
Christal from [employer] came to pick up signed forms that Judson Roch had signed earlier today

## 2019-08-25 DIAGNOSIS — J449 Chronic obstructive pulmonary disease, unspecified: Secondary | ICD-10-CM | POA: Diagnosis not present

## 2019-08-29 ENCOUNTER — Other Ambulatory Visit: Payer: Medicare HMO

## 2019-08-29 DIAGNOSIS — J849 Interstitial pulmonary disease, unspecified: Secondary | ICD-10-CM | POA: Diagnosis not present

## 2019-08-30 LAB — RESPIRATORY CULTURE OR RESPIRATORY AND SPUTUM CULTURE: MICRO NUMBER:: 10004063

## 2019-09-05 MED ORDER — FLUTTER DEVI: 1.0000 | 0 refills | Status: AC | PRN

## 2019-09-05 NOTE — Addendum Note (Signed)
Addended by: Valerie Salts on: 09/05/2019 02:43 PM   Modules accepted: Orders

## 2019-09-12 ENCOUNTER — Telehealth: Payer: Self-pay | Admitting: Internal Medicine

## 2019-09-12 ENCOUNTER — Other Ambulatory Visit: Payer: Self-pay

## 2019-09-12 ENCOUNTER — Ambulatory Visit (INDEPENDENT_AMBULATORY_CARE_PROVIDER_SITE_OTHER)
Admission: RE | Admit: 2019-09-12 | Discharge: 2019-09-12 | Disposition: A | Discharge status: Home / Self Care | Payer: Medicare HMO | Source: Ambulatory Visit | Attending: Internal Medicine | Admitting: Internal Medicine

## 2019-09-12 DIAGNOSIS — J849 Interstitial pulmonary disease, unspecified: Secondary | ICD-10-CM

## 2019-09-12 DIAGNOSIS — R0602 Shortness of breath: Secondary | ICD-10-CM | POA: Diagnosis not present

## 2019-09-12 NOTE — Telephone Encounter (Signed)
Called daughter- no answer. Unable to get good sputum sample, does probably warrant a bronch, want to arrange for 09/20/19 in endoscopy.  Erskine Emery MD

## 2019-09-13 ENCOUNTER — Telehealth: Payer: Self-pay

## 2019-09-13 NOTE — Telephone Encounter (Signed)
Called and spoke with [name] patient's daughter. Bronchoscopy set up for 09/20/2019 at 7:30am at [facility]. Covid testing set up for 09/17/2019 at 10:45am at [facility].  Case [id number]  Nothing further needed at this time.

## 2019-09-13 NOTE — Telephone Encounter (Signed)
[name] daughter is returning phone call.  [name] phone number is [phone].

## 2019-09-13 NOTE — Telephone Encounter (Signed)
Just called again, no answer and left VM.  If she calls again come find me in Pod C so we don't have to keep playing phone tag thanks!

## 2019-09-13 NOTE — Telephone Encounter (Signed)
-----   Message from Candee Furbish, MD sent at 09/13/2019 10:24 AM EST ----- Regarding: Bronchoscopy Hi Caelynn Marshman do you mind setting this up. Larene [place]- I cc'd you just to make sure this goes smooth.  Won't in future ones if everything goes okay.  Procedure: Bronchoscopy with BAL CPT: [id number] Diagnosis: Pneumonia Date: 09/20/2019, time: whenever Location: [facility]  Who to call: OR scheduling [phone] then make sure someone calls daughter with instructions.  Patient is demented.  Thanks, Linna Hoff

## 2019-09-17 ENCOUNTER — Other Ambulatory Visit (HOSPITAL_COMMUNITY): Payer: Medicare HMO

## 2019-09-19 ENCOUNTER — Telehealth: Payer: Self-pay | Admitting: Internal Medicine

## 2019-09-19 NOTE — Telephone Encounter (Signed)
Thanks for letting me know.  Are they going to follow up here?  If not I need to send a certified letter about dangers not following up abnormal chest imaging.

## 2019-09-19 NOTE — Telephone Encounter (Signed)
I will let Dr. Tamala Julian know his procedure is cancelled.

## 2019-09-20 ENCOUNTER — Encounter (HOSPITAL_COMMUNITY): Admission: RE | Payer: Self-pay | Source: Home / Self Care

## 2019-09-20 ENCOUNTER — Ambulatory Visit (HOSPITAL_COMMUNITY): Admission: RE | Admit: 2019-09-20 | Payer: Medicare HMO | Source: Home / Self Care | Admitting: Internal Medicine

## 2019-09-20 SURGERY — VIDEO BRONCHOSCOPY WITHOUT FLUORO
Anesthesia: General

## 2019-09-20 NOTE — Telephone Encounter (Signed)
Spoke with daughter. She states she has a follow up on 10/11/19 they will be in office for that visit.   Daughter said she cancelled procedure because there wasn't anything seen on imaging. She doesn't believe there is an infection because she is giving patient breathing treatments.  Patient also had another appointment and daughter was told patient would be put asleep and might be hard to wake. Daughter didn't want patient to be drowsy for other appointment.  Daughter states her father does not have a breathing problem, so she canceled the procedure.   Dr. Tamala Julian please advise if 10/11/19 is a good follow up date or if you want them in office sooner.

## 2019-09-20 NOTE — Telephone Encounter (Signed)
That date is fine, I can show her the pictures.  Thanks

## 2019-09-25 DIAGNOSIS — J449 Chronic obstructive pulmonary disease, unspecified: Secondary | ICD-10-CM | POA: Diagnosis not present

## 2019-10-03 DIAGNOSIS — J449 Chronic obstructive pulmonary disease, unspecified: Secondary | ICD-10-CM | POA: Diagnosis not present

## 2019-10-06 ENCOUNTER — Other Ambulatory Visit: Payer: Self-pay

## 2019-10-06 ENCOUNTER — Ambulatory Visit: Payer: Medicare HMO | Admitting: Pulmonary Disease

## 2019-10-06 ENCOUNTER — Ambulatory Visit (INDEPENDENT_AMBULATORY_CARE_PROVIDER_SITE_OTHER): Payer: Medicare HMO | Admitting: Pulmonary Disease

## 2019-10-06 ENCOUNTER — Encounter: Payer: Self-pay | Admitting: Pulmonary Disease

## 2019-10-06 DIAGNOSIS — J4520 Mild intermittent asthma, uncomplicated: Secondary | ICD-10-CM | POA: Diagnosis not present

## 2019-10-06 DIAGNOSIS — R918 Other nonspecific abnormal finding of lung field: Secondary | ICD-10-CM | POA: Insufficient documentation

## 2019-10-06 NOTE — Progress Notes (Signed)
Due to miscommunication from staff patient was also scheduled again in error.  Patient has already completed televisit with our office today.  See that formal documented note.  Wyn Quaker, FNP

## 2019-10-06 NOTE — Progress Notes (Signed)
Patient was rescheduled in error.  Patient is already had televisit completed today.  See that finalize note.  Wyn Quaker, FNP

## 2019-10-06 NOTE — Progress Notes (Signed)
This is a tele-medicine visit due to transportation issues and to preserve PPE during the [place] pandemic.  Synopsis: Referred in Dec 2020 for cough, abnormal x-ray by Biagio Borg, MD    Subjective:   PATIENT ID: Matthew Gray GENDER: male DOB: 1934/02/07, MRN: [id number]  No chief complaint on file.   HPI Presenting history as below.  Subsequent workup included CT chest which showed bibasilar bronchiectasis with formation of a mass-like infiltrate in LLL.  After discussion of risks and benefits, daughter agreed to bring patient for bronchoscopy to evaluate for malignancy and to look for atypical infections.  They cancelled the bronchoscopy later with daughter stating that duonebs had made his cough much better and she does not see utility of doing bronchoscopy.  There is a great deal of confusion regarding the CT findings so we are doing a telephone visit to clarify this.  Today: patient doing better with breathing treatments, daughter is trying to stay positive and does not want to put him through more imaging or procedures unless he is feeling worse clinically (see discussion).   86 year old man with dementia presenting for evaluation of abnormal CXR. History is per daughter in room. -Longstanding cough productive of clear to yellow sputum. -Worse after eating. -Has trouble clearing secretions. -Available CTs that I see show abnormal upper and lower lung architecture.  CXR read as only issues in LLL -Has a couple ER visits and ER admissions for "pneumonia" as well - PFTs c/w asthma - Has albuterol inhaler but does not know how to use it  ROS + symptoms in bold Fevers, chills, weight loss Nausea, vomiting, diarrhea Shortness of breath, wheezing, cough Chest pain, palpitations, lower ext edema   Past Medical History:  Diagnosis Date  . ALLERGIC RHINITIS 10/27/2007  . ANKLE PAIN, RIGHT 11/30/2009  . ASTHMA 10/27/2007  . BACK PAIN 10/08/2009  . BENIGN PROSTATIC HYPERTROPHY 10/27/2007    . CHRONIC OBSTRUCTIVE PULMONARY DISEASE, ACUTE EXACERBATION 11/22/2007  . COLONIC POLYPS, HX OF 10/27/2007  . COPD 10/27/2007  . CORONARY ARTERY DISEASE 10/27/2007  . Dementia ([place]) 01/13/2013  . DEPRESSION 11/30/2009  . FATIGUE 11/30/2009  . Hemoptysis 04/24/2009  . HYPERLIPIDEMIA 04/03/2007  . HYPERTENSION 04/03/2007  . MYALGIA 02/04/2008  . Obstructive chronic bronchitis without exacerbation ([place])   . Pain in Soft Tissues of Limb 02/04/2008  . PARESTHESIA 09/02/2010  . PERIPHERAL EDEMA 11/16/2009  . Peripheral neuropathy 03/08/2012  . PERIPHERAL VASCULAR DISEASE 10/27/2007  . 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 RASH-NONVESICULAR 10/27/2007  . SCIATICA, LEFT 11/08/2009  . SHOULDER PAIN, LEFT 06/21/2009  . SINUSITIS- ACUTE-NOS 10/08/2009  . VITAMIN D DEFICIENCY 06/04/2010     Family History  Problem Relation Age of Onset  . Cancer Mother        BREAST  . Cancer Father        LUNG  . Hypertension Sister        3 sisters  . Anuerysm Brother        died with  . Stroke Other   . Cancer Other        lung and breast  . Alcohol abuse Other      Past Surgical History:  Procedure Laterality Date  . None      Social History   Socioeconomic History  . Marital status: Divorced    Spouse name: Not on file  . Number of children: 3  . Years of education: 33  . Highest education level: Not on file  Occupational  History    Employer: RETIRED  Tobacco Use  . Smoking status: Never Smoker  . Smokeless tobacco: Never Used  . Tobacco comment: Quit ten years ago.  Substance and Sexual Activity  . Alcohol use: No  . Drug use: No  . Sexual activity: Never  Other Topics Concern  . Not on file  Social History Narrative   He had 12 years of education, retired from [employer] at age 78, lives alone for 35 years, has 3 children   Patient right handed.   Caffeine-  Coffee three times a week.   Social Determinants of Health   Financial Resource Strain:   . Difficulty of Paying Living Expenses:  Not on file  Food Insecurity:   . Worried About Charity fundraiser in the Last Year: Not on file  . Ran Out of Food in the Last Year: Not on file  Transportation Needs:   . Lack of Transportation (Medical): Not on file  . Lack of Transportation (Non-Medical): Not on file  Physical Activity:   . Days of Exercise per Week: Not on file  . Minutes of Exercise per Session: Not on file  Stress:   . Feeling of Stress : Not on file  Social Connections:   . Frequency of Communication with Friends and Family: Not on file  . Frequency of Social Gatherings with Friends and Family: Not on file  . Attends Religious Services: Not on file  . Active Member of Clubs or Organizations: Not on file  . Attends Archivist Meetings: Not on file  . Marital Status: Not on file  Intimate Partner Violence:   . Fear of Current or Ex-Partner: Not on file  . Emotionally Abused: Not on file  . Physically Abused: Not on file  . Sexually Abused: Not on file     Allergies  Allergen Reactions  . Atorvastatin Other (See Comments)    REACTION: myalgias  . Simvastatin Other (See Comments)    REACTION: myalgias  . Zetia [Ezetimibe] Other (See Comments)    weakness  . Ace Inhibitors Other (See Comments)    unknown  . Penicillins Other (See Comments)    Unknown;  Tolerated Ceftriaxone 10/2018     Outpatient Medications Prior to Visit  Medication Sig Dispense Refill  . acetaminophen (TYLENOL) 325 MG tablet Take 2 tablets (650 mg total) by mouth every 6 (six) hours as needed for mild pain (or Fever >/= 101). 30 tablet 0  . albuterol (PROVENTIL HFA;VENTOLIN HFA) 108 (90 Base) MCG/ACT inhaler Inhale 2 puffs into the lungs every 6 (six) hours as needed for wheezing or shortness of breath. 1 Inhaler 11  . amLODipine (NORVASC) 5 MG tablet TAKE 1 TABLET EVERY DAY 90 tablet 1  . aspirin EC 81 MG tablet Take 1 tablet (81 mg total) by mouth daily. 90 tablet 11  . cetirizine (ZYRTEC) 10 MG tablet Take 1 tablet (10  mg total) by mouth daily. (Patient not taking: Reported on 08/18/2019) 30 tablet 11  . donepezil (ARICEPT) 10 MG tablet TAKE 1 TABLET EVERY DAY  AT  2AM 90 tablet 1  . guaiFENesin (MUCINEX) 600 MG 12 hr tablet Take 2 tablets (1,200 mg total) by mouth 2 (two) times daily. 14 tablet 0  . ipratropium-albuterol (DUONEB) 0.5-2.5 (3) MG/3ML SOLN Take 3 mLs by nebulization every 6 (six) hours as needed (for cough or shortness of breath). 360 mL 3  . Multiple Vitamins-Minerals (ALIVE MENS ENERGY) TABS Take 1 tablet by mouth  daily.    . promethazine-dextromethorphan (PROMETHAZINE-DM) 6.25-15 MG/5ML syrup Take 5 mLs by mouth 4 (four) times daily as needed for cough. 118 mL 5  . QUEtiapine (SEROQUEL) 25 MG tablet TAKE 1 TABLET BY MOUTH IN THE MORNING, AND TAKE 3 TABLETS IN THE EVENING (Patient taking differently: 25 mg. TAKE 1 TABLET BY MOUTH IN THE MORNING, AND TAKE 1 TABLET IN THE EVENING) 360 tablet 1  . Respiratory Therapy Supplies (FLUTTER) DEVI 1 Device by Does not apply route as needed. 1 each 0  . tamsulosin (FLOMAX) 0.4 MG CAPS capsule Take 1 capsule (0.4 mg total) by mouth daily. 30 capsule 5  . triamcinolone (NASACORT) 55 MCG/ACT AERO nasal inhaler Place 2 sprays into the nose daily. 1 Inhaler 12   No facility-administered medications prior to visit.     Objective:  Tele visit  There were no vitals filed for this visit.   on RA BMI Readings from Last 3 Encounters:  08/18/19 23.47 kg/m  08/04/19 23.73 kg/m  06/28/19 22.92 kg/m   Wt Readings from Last 3 Encounters:  08/18/19 147 lb 12.8 oz (67 kg)  08/04/19 147 lb (66.7 kg)  06/28/19 142 lb (64.4 kg)     CBC    Component Value Date/Time   WBC 7.1 11/05/2018 0929   RBC 5.03 11/05/2018 0929   HGB 12.9 (L) 11/05/2018 0929   HCT 41.9 11/05/2018 0929   PLT 151 11/05/2018 0929   MCV 83.3 11/05/2018 0929   MCV 81.0 11/02/2015 1601   MCH 25.6 (L) 11/05/2018 0929   MCHC 30.8 11/05/2018 0929   RDW 15.4 11/05/2018 0929   LYMPHSABS  0.6 (L) 11/05/2018 0929   MONOABS 0.4 11/05/2018 0929   EOSABS 0.2 11/05/2018 0929   BASOSABS 0.0 11/05/2018 0929   CXR fairly benign, maybe some LLL bronchiectasis  PFT 2012 Ratio 0.53, post BD 1.7L (62% pred), large BD response, borderline air trapping on lung volumes and DLCO WNL   2013 echo WNL  2017 CT abd with remarkable bilateral lower lobe infiltrates on lung fields  March 2020 MBSS with mild aspiration risk  March 2020 urine strep ag +  Assessment & Plan:  Problem 1 Recurrent pneumonia vs. bronchitis over past several years, improved with duonebs Problem 2 Abnormal CT imaging of chest particularly in left lower lobe raising concern for in order of likelihood (1) atypical infection (2) atypical scarring from recurrent aspiration (3) malignancy. Problem 3 Frail elderly with advanced dementia  MDM . I reviewed prior external note(s) from Dr. [name] on 08/07/2019. . I reviewed the result(s) of CBC (normal) and CMP (low albumin, mild CKD) on 11/05/2018.  05/15/2011-pulmonary function test-FVC 2.40 (58% predicted), ratio 56, postbronchodilator FEV1 1.68 (62% predicted), positive bronchodilator response, DLCO 18.9 (103% predicted) . I have ordered continuation of duoneb therapy, repeat CT imaging.  Independent interpretation of tests . Review of patient's CT chest images (09/12/19) reveal bilateral lower lobe bronchiectasis with tree-in-bud infiltrates and mass-like infiltrate at left base. The patient's images have been independently reviewed by me.    After extensive repeat discussion regarding risks and benefits of bronchoscopic evaluation of left lower lobe infiltrate vs. serial imaging, daughter elects to monitor patient symptoms and call us for follow up if any worsening.  I have re-emphasized and she understands that we may be missing a progressive infection or cancer which may limit Mr. Forrester's lifespan or quality of life.  She wants to remain positive that the nebulizers  will prevent any such complication.  Current Outpatient Medications:  .  acetaminophen (TYLENOL) 325 MG tablet, Take 2 tablets (650 mg total) by mouth every 6 (six) hours as needed for mild pain (or Fever >/= 101)., Disp: 30 tablet, Rfl: 0 .  albuterol (PROVENTIL HFA;VENTOLIN HFA) 108 (90 Base) MCG/ACT inhaler, Inhale 2 puffs into the lungs every 6 (six) hours as needed for wheezing or shortness of breath., Disp: 1 Inhaler, Rfl: 11 .  amLODipine (NORVASC) 5 MG tablet, TAKE 1 TABLET EVERY DAY, Disp: 90 tablet, Rfl: 1 .  aspirin EC 81 MG tablet, Take 1 tablet (81 mg total) by mouth daily., Disp: 90 tablet, Rfl: 11 .  cetirizine (ZYRTEC) 10 MG tablet, Take 1 tablet (10 mg total) by mouth daily. (Patient not taking: Reported on 08/18/2019), Disp: 30 tablet, Rfl: 11 .  donepezil (ARICEPT) 10 MG tablet, TAKE 1 TABLET EVERY DAY  AT  2AM, Disp: 90 tablet, Rfl: 1 .  guaiFENesin (MUCINEX) 600 MG 12 hr tablet, Take 2 tablets (1,200 mg total) by mouth 2 (two) times daily., Disp: 14 tablet, Rfl: 0 .  ipratropium-albuterol (DUONEB) 0.5-2.5 (3) MG/3ML SOLN, Take 3 mLs by nebulization every 6 (six) hours as needed (for cough or shortness of breath)., Disp: 360 mL, Rfl: 3 .  Multiple Vitamins-Minerals (ALIVE MENS ENERGY) TABS, Take 1 tablet by mouth daily., Disp: , Rfl:  .  promethazine-dextromethorphan (PROMETHAZINE-DM) 6.25-15 MG/5ML syrup, Take 5 mLs by mouth 4 (four) times daily as needed for cough., Disp: 118 mL, Rfl: 5 .  QUEtiapine (SEROQUEL) 25 MG tablet, TAKE 1 TABLET BY MOUTH IN THE MORNING, AND TAKE 3 TABLETS IN THE EVENING (Patient taking differently: 25 mg. TAKE 1 TABLET BY MOUTH IN THE MORNING, AND TAKE 1 TABLET IN THE EVENING), Disp: 360 tablet, Rfl: 1 .  Respiratory Therapy Supplies (FLUTTER) DEVI, 1 Device by Does not apply route as needed., Disp: 1 each, Rfl: 0 .  tamsulosin (FLOMAX) 0.4 MG CAPS capsule, Take 1 capsule (0.4 mg total) by mouth daily., Disp: 30 capsule, Rfl: 5 .  triamcinolone  (NASACORT) 55 MCG/ACT AERO nasal inhaler, Place 2 sprays into the nose daily., Disp: 1 Inhaler, Rfl: Scissors, MD [facility] 10/06/2019 10:16 PM

## 2019-10-06 NOTE — Assessment & Plan Note (Signed)
Abnormal high-resolution CT chest Initially bronchoscopy was scheduled to further evaluate, this was canceled Differential diagnosis could include: Recurrent pneumonia, drug-resistant bacterial pneumonia, Mycobacterium/atypical infections, potential malignancy  Discussion: Reviewed high-resolution CT chest results with patient's daughter over the phone today.  This is an obvious barrier as we cannot review the actual images together on the same screen.  Tried to further explain that one of the things that we did not see on the high-resolution CT chest results was pulmonary fibrosis.  I then tried to emphasize that we did see a persistent left lower lobe consolidation and bronchiectasis.  This is likely why her father continues to have recurrent pneumonias.  Unfortunately due to this left lower lobe consolidation we cannot rule out other acute or abnormalities such as malignancies masses or nodules within this consolidation.  This is why Dr. Tamala Julian wanted to obtain additional samples and help remove some of this mucus.  Daughter is concerned regarding the invasiveness of this procedure as well as her father with baseline dementia.  She believes that clinically he has improved since starting duo nebs.  She also is confused that she is under the impression that nothing was seen on the high-resolution CT chest.  After much conversation I believed it would be best for the patient to be scheduled for a televisit next week.  Unfortunately the patient is unable to come into the office due to being back at adult daycare.  Patient's daughter [name] is available by phone.  She would like to review the high-resolution CT chest in more detail with Dr. Tamala Julian over the phone.  Plan: For right now since bronchoscopy was currently canceled and has not rescheduled We can release tentatively plan on repeating CT imaging in 2 to 3 months to see if consolidation is improving with duo nebs  As I emphasized multiple times to  [name] I recommendation still stands that we would prefer to perform a bronchoscopy in order to further evaluate the sputum/mucus that were seen on the CT as well as ensure that there is no aspect of a malignancy within that consolidation  I will discuss this also with Dr. Tamala Julian as he will be speaking with [name] on 10/10/2019

## 2019-10-06 NOTE — Progress Notes (Signed)
Virtual Visit via Telephone Note  I connected with Matthew Gray on 10/06/19 at  2:30 PM EST by telephone and verified that I am speaking with the correct person using two identifiers.  Location: Patient: Home Provider: [facility] - [address], [place], [place], [place] [address]   I discussed the limitations, risks, security and privacy concerns of performing an evaluation and management service by telephone and the availability of in person appointments. I also discussed with the patient that there may be a patient responsible charge related to this service. The patient expressed understanding and agreed to proceed.  Patient consented to consult via telephone: Yes People present and their role in pt care: Pt   History of Present Illness:  86 year old male never smoker initially consulted with our practice on 08/18/2019 for an abnormal CT chest, recurrent pneumonia, asthma and suspected and interstitial pulmonary disease  Past medical history: Hyperlipidemia, depression, hypertension, anxiety, dementia Smoking history: Never smoker Maintenance: Patient of Dr. Tamala Julian  Chief complaint: Televisit, recently canceled bronchoscopy   86 year old male never smoker completing a televisit with our office today.  Speaking with patient's daughter Matthew Gray.  Patient has baseline dementia and is unavailable at this time.  The televisit was scheduled due to concerns with the current work-up.  Patient was seen in December/2020 for consult.  Following that consult patient was started on DuoNeb nebulized meds as well as a high-resolution CT chest was ordered to further evaluate an abnormal x-ray that appeared to maybe show left lower lobe bronchiectasis.  High-resolution CT chest results are listed below:  09/12/2019-CT chest high-res-no evidence of fibrotic interstitial lung disease, increasing consolidation left lower lobe that may be due to superimposed pneumonia, malignancy cannot be  excluded, multilobular bronchiectasis, mucoid impaction and peribronchovascular nodularity, findings can be seen in atypical infection process such as chronic Mycobacterium avium complex  Sputum samples were requested from the patient.  Unfortunately he was unable to cough up a suitable sample.  Due to this patient was scheduled for a bronchoscopy to obtain a more suitable sample specifically from the left lower lobe.  Patient's daughter reporting today that patient's cough has improved by using the duo nebs.  She feels that this is really helping.  She also feels that her father is able to bring it more mucus.  There continues to be confusion regarding the status of the patient's high-resolution CT chest.  The patient's daughter keeps reporting that either radiology or our office reported that there was nothing abnormal on the CT chest.  We will further review this today.  Due to this confusion the bronchoscopy was canceled.  Follow-up visit with Dr. Tamala Julian on 10/11/2019 that was initially scheduled was also canceled and moved to today's appointment.  Patient will be unavailable for an office visit the week of 10/10/2019 as he will be back in his daytime adult care center.  Patient's daughter Matthew Gray will be back at work.  She is available via phones to discuss work-up at that time.   Observations/Objective:  11/05/2018-CBC with differential-eosinophils relative 3, eosinophils absolute 0.2  11/04/2018-swallow study-minimum to mild oral dysphagia with normal pharyngeal phase swallow function, mild aspiration risk, no treatment recommendations at this time  09/12/2019-CT chest high-res-no evidence of fibrotic interstitial lung disease, increasing consolidation left lower lobe that may be due to superimposed pneumonia, malignancy cannot be excluded, multilobular bronchiectasis, mucoid impaction and peribronchovascular nodularity, findings can be seen in atypical infection process such as chronic Mycobacterium  avium complex  05/15/2011-pulmonary  function test-FVC 2.40 (58% predicted), ratio 56, postbronchodilator FEV1 1.68 (62% predicted), positive bronchodilator response, DLCO 18.9 (103% predicted)  Social History   Tobacco Use  Smoking Status Never Smoker  Smokeless Tobacco Never Used  Tobacco Comment   Quit ten years ago.   Immunization History  Administered Date(s) Administered  . Influenza, High Dose Seasonal PF 11/03/2018  . PPD Test 05/24/2018  . Pneumococcal Conjugate-13 07/19/2014  . Pneumococcal Polysaccharide-23 11/22/2010, 11/03/2018  . Tdap 11/22/2010   Assessment and Plan:  Asthma Plan: Continue DuoNeb nebulized medications at this time If the AFB negative after bronchoscopy if this is ever completed could consider budesonide nebulized meds Would prefer to hold off on ICS nebulized medications -until we fully know if the consolidation left lower lobe is a bacterial infection or atypical infection like Mycobacterium  Abnormal findings on diagnostic imaging of lung Abnormal high-resolution CT chest Initially bronchoscopy was scheduled to further evaluate, this was canceled Differential diagnosis could include: Recurrent pneumonia, drug-resistant bacterial pneumonia, Mycobacterium/atypical infections, potential malignancy  Discussion: Reviewed high-resolution CT chest results with patient's daughter over the phone today.  This is an obvious barrier as we cannot review the actual images together on the same screen.  Tried to further explain that one of the things that we did not see on the high-resolution CT chest results was pulmonary fibrosis.  I then tried to emphasize that we did see a persistent left lower lobe consolidation and bronchiectasis.  This is likely why her father continues to have recurrent pneumonias.  Unfortunately due to this left lower lobe consolidation we cannot rule out other acute or abnormalities such as malignancies masses or nodules within this  consolidation.  This is why Dr. Tamala Julian wanted to obtain additional samples and help remove some of this mucus.  Daughter is concerned regarding the invasiveness of this procedure as well as her father with baseline dementia.  She believes that clinically he has improved since starting duo nebs.  She also is confused that she is under the impression that nothing was seen on the high-resolution CT chest.  After much conversation I believed it would be best for the patient to be scheduled for a televisit next week.  Unfortunately the patient is unable to come into the office due to being back at adult daycare.  Patient's daughter Matthew Gray is available by phone.  She would like to review the high-resolution CT chest in more detail with Dr. Tamala Julian over the phone.  Plan: For right now since bronchoscopy was currently canceled and has not rescheduled We can release tentatively plan on repeating CT imaging in 2 to 3 months to see if consolidation is improving with duo nebs  As I emphasized multiple times to Matthew Gray I recommendation still stands that we would prefer to perform a bronchoscopy in order to further evaluate the sputum/mucus that were seen on the CT as well as ensure that there is no aspect of a malignancy within that consolidation  I will discuss this also with Dr. Tamala Julian as he will be speaking with Matthew Gray on 10/10/2019   Follow Up Instructions:  Return in about 4 days (around 10/10/2019), or if symptoms worsen or fail to improve, for Follow up with Dr. Tamala Julian.   I discussed the assessment and treatment plan with the patient. The patient was provided an opportunity to ask questions and all were answered. The patient agreed with the plan and demonstrated an understanding of the instructions.   The patient was advised to call back or  seek an in-person evaluation if the symptoms worsen or if the condition fails to improve as anticipated.  I provided 24 minutes of non-face-to-face time during this  encounter.   Lauraine Rinne, NP

## 2019-10-06 NOTE — Assessment & Plan Note (Signed)
Plan: Continue DuoNeb nebulized medications at this time If the AFB negative after bronchoscopy if this is ever completed could consider budesonide nebulized meds Would prefer to hold off on ICS nebulized medications -until we fully know if the consolidation left lower lobe is a bacterial infection or atypical infection like Mycobacterium

## 2019-10-06 NOTE — Patient Instructions (Addendum)
You were seen today by Lauraine Rinne, NP  for:   1. Mild intermittent asthma without complication  Continue DuoNeb nebulized medications at this time  2. Abnormal findings on diagnostic imaging of lung  I recommendation remains that we should have the patient be scheduled for bronchoscopy so we can further evaluate what we are seeing on his January/2021 CT of his chest.  We will get you scheduled for a televisit with Dr. Tamala Julian next week to further discuss this.  As well as the risk and benefits of proceeding forward with the procedure now or delaying the procedure.   Follow Up:    Return in about 4 days (around 10/10/2019), or if symptoms worsen or fail to improve, for Follow up with Dr. Tamala Julian.   Please do your part to reduce the spread of COVID-19:      Reduce your risk of any infection  and COVID19 by using the similar precautions used for avoiding the common cold or flu:  Marland Kitchen Wash your hands often with soap and warm water for at least 20 seconds.  If soap and water are not readily available, use an alcohol-based hand sanitizer with at least 60% alcohol.  . If coughing or sneezing, cover your mouth and nose by coughing or sneezing into the elbow areas of your shirt or coat, into a tissue or into your sleeve (not your hands). Langley Gauss A MASK when in public  . Avoid shaking hands with others and consider head nods or verbal greetings only. . Avoid touching your eyes, nose, or mouth with unwashed hands.  . Avoid close contact with people who are sick. . Avoid places or events with large numbers of people in one location, like concerts or sporting events. . If you have some symptoms but not all symptoms, continue to monitor at home and seek medical attention if your symptoms worsen. . If you are having a medical emergency, call 911.   [place] / e-Visit: [email or web address]         [facility]: [facility]: [id number]                   [facility]: [id number]     It is flu season:   >>> Best ways to protect herself from the flu: Receive the yearly flu vaccine, practice good hand hygiene washing with soap and also using hand sanitizer when available, eat a nutritious meals, get adequate rest, hydrate appropriately   Please contact the office if your symptoms worsen or you have concerns that you are not improving.   Thank you for choosing [facility] for your healthcare, and for allowing [place] to partner with you on your healthcare journey. I am thankful to be able to provide care to you today.   Wyn Quaker FNP-C

## 2019-10-10 ENCOUNTER — Other Ambulatory Visit: Payer: Self-pay

## 2019-10-10 ENCOUNTER — Ambulatory Visit (INDEPENDENT_AMBULATORY_CARE_PROVIDER_SITE_OTHER): Payer: Medicare HMO | Admitting: Internal Medicine

## 2019-10-10 ENCOUNTER — Ambulatory Visit: Payer: Medicare HMO | Admitting: Internal Medicine

## 2019-10-10 DIAGNOSIS — Z7389 Other problems related to life management difficulty: Secondary | ICD-10-CM | POA: Diagnosis not present

## 2019-10-10 DIAGNOSIS — R9389 Abnormal findings on diagnostic imaging of other specified body structures: Secondary | ICD-10-CM | POA: Diagnosis not present

## 2019-10-11 ENCOUNTER — Ambulatory Visit: Payer: Medicare HMO | Admitting: Internal Medicine

## 2019-10-23 DIAGNOSIS — J449 Chronic obstructive pulmonary disease, unspecified: Secondary | ICD-10-CM | POA: Diagnosis not present

## 2019-11-03 DIAGNOSIS — J449 Chronic obstructive pulmonary disease, unspecified: Secondary | ICD-10-CM | POA: Diagnosis not present

## 2019-11-23 ENCOUNTER — Other Ambulatory Visit: Payer: Self-pay | Admitting: Internal Medicine

## 2019-11-23 DIAGNOSIS — J449 Chronic obstructive pulmonary disease, unspecified: Secondary | ICD-10-CM | POA: Diagnosis not present

## 2019-12-13 ENCOUNTER — Telehealth: Payer: Self-pay | Admitting: Internal Medicine

## 2019-12-13 NOTE — Telephone Encounter (Signed)
    Pt c/o medication issue:  1. Name of Medication: QUEtiapine (SEROQUEL) 25 MG tablet  2. How are you currently taking this medication (dosage and times per day)? 2 a day  3. Are you having a reaction (difficulty breathing--STAT)? no  4. What is your medication issue? Daughter [name], she has decreased dose, giving 1 in the morning and 1 in the evening. States patient is doing well

## 2019-12-14 NOTE — Telephone Encounter (Signed)
FYI   QUEtiapine (SEROQUEL) 25 MG tablet is given 1q Am and 1q PM. Pt dtr states pt is doing well.

## 2019-12-20 ENCOUNTER — Other Ambulatory Visit: Payer: Self-pay | Admitting: Internal Medicine

## 2019-12-20 NOTE — Telephone Encounter (Signed)
Please refill as per office routine med refill policy (all routine meds refilled for 3 mo or monthly per pt preference up to one year from last visit, then month to month grace period for 3 mo, then further med refills will have to be denied)  

## 2019-12-23 DIAGNOSIS — J449 Chronic obstructive pulmonary disease, unspecified: Secondary | ICD-10-CM | POA: Diagnosis not present

## 2020-01-10 DIAGNOSIS — J449 Chronic obstructive pulmonary disease, unspecified: Secondary | ICD-10-CM | POA: Diagnosis not present

## 2020-01-23 DIAGNOSIS — J449 Chronic obstructive pulmonary disease, unspecified: Secondary | ICD-10-CM | POA: Diagnosis not present

## 2020-02-22 DIAGNOSIS — J449 Chronic obstructive pulmonary disease, unspecified: Secondary | ICD-10-CM | POA: Diagnosis not present

## 2020-03-05 ENCOUNTER — Ambulatory Visit (INDEPENDENT_AMBULATORY_CARE_PROVIDER_SITE_OTHER): Payer: Medicare HMO

## 2020-03-05 ENCOUNTER — Encounter: Payer: Self-pay | Admitting: Internal Medicine

## 2020-03-05 ENCOUNTER — Ambulatory Visit (INDEPENDENT_AMBULATORY_CARE_PROVIDER_SITE_OTHER): Payer: Medicare HMO | Admitting: Internal Medicine

## 2020-03-05 ENCOUNTER — Other Ambulatory Visit: Payer: Self-pay

## 2020-03-05 ENCOUNTER — Other Ambulatory Visit: Payer: Self-pay | Admitting: Internal Medicine

## 2020-03-05 VITALS — BP 158/90 | HR 70 | Temp 98.3°F | Ht 66.54 in | Wt 138.0 lb

## 2020-03-05 DIAGNOSIS — K5900 Constipation, unspecified: Secondary | ICD-10-CM

## 2020-03-05 DIAGNOSIS — R634 Abnormal weight loss: Secondary | ICD-10-CM

## 2020-03-05 DIAGNOSIS — R05 Cough: Secondary | ICD-10-CM | POA: Diagnosis not present

## 2020-03-05 DIAGNOSIS — M47816 Spondylosis without myelopathy or radiculopathy, lumbar region: Secondary | ICD-10-CM | POA: Diagnosis not present

## 2020-03-05 DIAGNOSIS — J189 Pneumonia, unspecified organism: Secondary | ICD-10-CM | POA: Insufficient documentation

## 2020-03-05 DIAGNOSIS — M545 Low back pain, unspecified: Secondary | ICD-10-CM

## 2020-03-05 DIAGNOSIS — R350 Frequency of micturition: Secondary | ICD-10-CM | POA: Diagnosis not present

## 2020-03-05 DIAGNOSIS — S32030A Wedge compression fracture of third lumbar vertebra, initial encounter for closed fracture: Secondary | ICD-10-CM | POA: Diagnosis not present

## 2020-03-05 DIAGNOSIS — J984 Other disorders of lung: Secondary | ICD-10-CM | POA: Diagnosis not present

## 2020-03-05 DIAGNOSIS — R059 Cough, unspecified: Secondary | ICD-10-CM

## 2020-03-05 DIAGNOSIS — R7301 Impaired fasting glucose: Secondary | ICD-10-CM | POA: Diagnosis not present

## 2020-03-05 MED ORDER — CEFDINIR 300 MG PO CAPS: 300.0000 mg | ORAL_CAPSULE | Freq: Two times a day (BID) | ORAL | 0 refills | Status: AC

## 2020-03-05 MED ORDER — OXYCODONE HCL 5 MG PO TABS: 5.0000 mg | ORAL_TABLET | ORAL | 0 refills | Status: DC | PRN

## 2020-03-05 NOTE — Patient Instructions (Signed)
Acute Back Pain, Adult Acute back pain is sudden and usually short-lived. It is often caused by an injury to the muscles and tissues in the back. The injury may result from:  A muscle or ligament getting overstretched or torn (strained). Ligaments are tissues that connect bones to each other. Lifting something improperly can cause a back strain.  Wear and tear (degeneration) of the spinal disks. Spinal disks are circular tissue that provides cushioning between the bones of the spine (vertebrae).  Twisting motions, such as while playing sports or doing yard work.  A hit to the back.  Arthritis. You may have a physical exam, lab tests, and imaging tests to find the cause of your pain. Acute back pain usually goes away with rest and home care. Follow these instructions at home: Managing pain, stiffness, and swelling  Take over-the-counter and prescription medicines only as told by your health care provider.  Your health care provider may recommend applying ice during the first 24-48 hours after your pain starts. To do this: ? Put ice in a plastic bag. ? Place a towel between your skin and the bag. ? Leave the ice on for 20 minutes, 2-3 times a day.  If directed, apply heat to the affected area as often as told by your health care provider. Use the heat source that your health care provider recommends, such as a moist heat pack or a heating pad. ? Place a towel between your skin and the heat source. ? Leave the heat on for 20-30 minutes. ? Remove the heat if your skin turns bright red. This is especially important if you are unable to feel pain, heat, or cold. You have a greater risk of getting burned. Activity   Do not stay in bed. Staying in bed for more than 1-2 days can delay your recovery.  Sit up and stand up straight. Avoid leaning forward when you sit, or hunching over when you stand. ? If you work at a desk, sit close to it so you do not need to lean over. Keep your chin tucked  in. Keep your neck drawn back, and keep your elbows bent at a right angle. Your arms should look like the letter "L." ? Sit high and close to the steering wheel when you drive. Add lower back (lumbar) support to your car seat, if needed.  Take short walks on even surfaces as soon as you are able. Try to increase the length of time you walk each day.  Do not sit, drive, or stand in one place for more than 30 minutes at a time. Sitting or standing for long periods of time can put stress on your back.  Do not drive or use heavy machinery while taking prescription pain medicine.  Use proper lifting techniques. When you bend and lift, use positions that put less stress on your back: ? Bend your knees. ? Keep the load close to your body. ? Avoid twisting.  Exercise regularly as told by your health care provider. Exercising helps your back heal faster and helps prevent back injuries by keeping muscles strong and flexible.  Work with a physical therapist to make a safe exercise program, as recommended by your health care provider. Do any exercises as told by your physical therapist. Lifestyle  Maintain a healthy weight. Extra weight puts stress on your back and makes it difficult to have good posture.  Avoid activities or situations that make you feel anxious or stressed. Stress and anxiety increase muscle   tension and can make back pain worse. Learn ways to manage anxiety and stress, such as through exercise. General instructions  Sleep on a firm mattress in a comfortable position. Try lying on your side with your knees slightly bent. If you lie on your back, put a pillow under your knees.  Follow your treatment plan as told by your health care provider. This may include: ? Cognitive or behavioral therapy. ? Acupuncture or massage therapy. ? Meditation or yoga. Contact a health care provider if:  You have pain that is not relieved with rest or medicine.  You have increasing pain going down  into your legs or buttocks.  Your pain does not improve after 2 weeks.  You have pain at night.  You lose weight without trying.  You have a fever or chills. Get help right away if:  You develop new bowel or bladder control problems.  You have unusual weakness or numbness in your arms or legs.  You develop nausea or vomiting.  You develop abdominal pain.  You feel faint. Summary  Acute back pain is sudden and usually short-lived.  Use proper lifting techniques. When you bend and lift, use positions that put less stress on your back.  Take over-the-counter and prescription medicines and apply heat or ice as directed by your health care provider. This information is not intended to replace advice given to you by your health care provider. Make sure you discuss any questions you have with your health care provider. Document Revised: 11/30/2018 Document Reviewed: 03/25/2017 Elsevier Patient Education  2020 Elsevier Inc.  

## 2020-03-05 NOTE — Progress Notes (Signed)
Subjective:  Patient ID: Matthew Gray, male    DOB: 1934-03-30  Age: 86 y.o. MRN: [id number]  CC: Cough and Back Pain  This visit occurred during the SARS-CoV-2 public health emergency.  Safety protocols were in place, including screening questions prior to the visit, additional usage of staff PPE, and extensive cleaning of exam room while observing appropriate contact time as indicated for disinfecting solutions.   New to me  HPI Matthew Gray presents for concerns about low back pain. He has dementia and goes to adult daycare. He is brought in today by his daughter and she tells me that the daycare center says he has not had a bowel movement in a week and he has had low back pain over the last week. They have also noticed weight loss. He has a mild, chronic, intermittent cough. He urinates frequently.  Outpatient Medications Prior to Visit  Medication Sig Dispense Refill   acetaminophen (TYLENOL) 325 MG tablet Take 2 tablets (650 mg total) by mouth every 6 (six) hours as needed for mild pain (or Fever >/= 101). 30 tablet 0   amLODipine (NORVASC) 5 MG tablet TAKE 1 TABLET EVERY DAY 90 tablet 2   aspirin EC 81 MG tablet Take 1 tablet (81 mg total) by mouth daily. 90 tablet 11   donepezil (ARICEPT) 10 MG tablet TAKE 1 TABLET EVERY DAY  AT  2AM 90 tablet 1   ipratropium-albuterol (DUONEB) 0.5-2.5 (3) MG/3ML SOLN Take 3 mLs by nebulization every 6 (six) hours as needed (for cough or shortness of breath). 360 mL 3   Multiple Vitamins-Minerals (ALIVE MENS ENERGY) TABS Take 1 tablet by mouth daily.     promethazine-dextromethorphan (PROMETHAZINE-DM) 6.25-15 MG/5ML syrup Take 5 mLs by mouth 4 (four) times daily as needed for cough. 118 mL 5   QUEtiapine (SEROQUEL) 25 MG tablet TAKE 1 TABLET BY MOUTH IN THE MORNING, AND TAKE 3 TABLETS IN THE EVENING 360 tablet 1   Respiratory Therapy Supplies (FLUTTER) DEVI 1 Device by Does not apply route as needed. 1 each 0   guaiFENesin (MUCINEX) 600 MG 12  hr tablet Take 2 tablets (1,200 mg total) by mouth 2 (two) times daily. 14 tablet 0   tamsulosin (FLOMAX) 0.4 MG CAPS capsule Take 1 capsule (0.4 mg total) by mouth daily. 30 capsule 5   triamcinolone (NASACORT) 55 MCG/ACT AERO nasal inhaler Place 2 sprays into the nose daily. 1 Inhaler 12   albuterol (PROVENTIL HFA;VENTOLIN HFA) 108 (90 Base) MCG/ACT inhaler Inhale 2 puffs into the lungs every 6 (six) hours as needed for wheezing or shortness of breath. (Patient not taking: Reported on 03/05/2020) 1 Inhaler 11   cetirizine (ZYRTEC) 10 MG tablet Take 1 tablet (10 mg total) by mouth daily. (Patient not taking: Reported on 08/18/2019) 30 tablet 11   No facility-administered medications prior to visit.    ROS Review of Systems  Constitutional: Positive for appetite change and unexpected weight change (wt loss). Negative for chills, diaphoresis and fatigue.  HENT: Negative for sore throat and trouble swallowing.   Respiratory: Positive for cough. Negative for chest tightness, shortness of breath and wheezing.   Cardiovascular: Negative for chest pain, palpitations and leg swelling.  Gastrointestinal: Positive for constipation. Negative for abdominal pain, diarrhea, nausea and vomiting.  Endocrine: Positive for polyuria.  Genitourinary: Positive for frequency. Negative for difficulty urinating and dysuria.  Musculoskeletal: Positive for back pain. Negative for arthralgias, myalgias and neck pain.  Skin: Negative.  Negative for color change, pallor and  rash.  Neurological: Negative.  Negative for dizziness and weakness.  Hematological: Negative for adenopathy. Does not bruise/bleed easily.  Psychiatric/Behavioral: Positive for confusion and decreased concentration. Negative for agitation and sleep disturbance.    Objective:  BP (!) 158/90 (BP Location: Left Arm, Patient Position: Sitting, Cuff Size: Normal)    Pulse 70    Temp 98.3 F (36.8 C) (Oral)    Ht 5' 6.54" (1.69 m)    Wt 138 lb (62.6  kg)    SpO2 95%    BMI 21.91 kg/m   BP Readings from Last 3 Encounters:  03/05/20 (!) 158/90  08/18/19 120/84  08/04/19 (!) 148/90    Wt Readings from Last 3 Encounters:  03/05/20 138 lb (62.6 kg)  08/18/19 147 lb 12.8 oz (67 kg)  08/04/19 147 lb (66.7 kg)    Physical Exam Vitals reviewed.  Constitutional:      General: He is not in acute distress.    Appearance: He is ill-appearing (elderly,frail). He is not toxic-appearing or diaphoretic.  HENT:     Nose: Nose normal.     Mouth/Throat:     Mouth: Mucous membranes are moist.  Eyes:     General: No scleral icterus.    Conjunctiva/sclera: Conjunctivae normal.  Cardiovascular:     Rate and Rhythm: Normal rate and regular rhythm.     Heart sounds: No murmur heard.   Pulmonary:     Effort: No respiratory distress.     Breath sounds: Normal air entry. Examination of the right-upper field reveals rhonchi. Examination of the left-upper field reveals rhonchi. Examination of the right-middle field reveals rhonchi. Examination of the left-middle field reveals rhonchi. Examination of the right-lower field reveals rhonchi. Examination of the left-lower field reveals rhonchi. Rhonchi present. No decreased breath sounds, wheezing or rales.  Chest:     Chest wall: No mass, tenderness or crepitus.  Abdominal:     General: Abdomen is flat.     Palpations: There is no mass.     Tenderness: There is no abdominal tenderness. There is no guarding.  Musculoskeletal:     Cervical back: Normal and neck supple.     Thoracic back: Normal.     Lumbar back: Bony tenderness present. No swelling, deformity, signs of trauma, spasms or tenderness. Decreased range of motion. Negative right straight leg raise test and negative left straight leg raise test.     Right lower leg: No edema.     Left lower leg: No edema.  Lymphadenopathy:     Cervical: No cervical adenopathy.  Skin:    General: Skin is warm and dry.     Coloration: Skin is not pale.   Neurological:     General: No focal deficit present.     Mental Status: He is alert.     Motor: Motor function is intact. No weakness.     Coordination: Coordination is intact.     Deep Tendon Reflexes: Reflexes are normal and symmetric.  Psychiatric:        Mood and Affect: Mood normal.     Lab Results  Component Value Date   WBC 6.7 03/05/2020   HGB 13.3 03/05/2020   HCT 41.6 03/05/2020   PLT 218 03/05/2020   GLUCOSE 95 03/05/2020   CHOL 207 (H) 10/14/2018   TRIG 93.0 10/14/2018   HDL 53.40 10/14/2018   LDLDIRECT 131.5 06/24/2006   LDLCALC 135 (H) 10/14/2018   ALT 11 03/05/2020   AST 16 03/05/2020   NA 140 03/05/2020  K 3.9 03/05/2020   CL 100 03/05/2020   CREATININE 1.09 03/05/2020   BUN 26 (H) 03/05/2020   CO2 30 03/05/2020   TSH 0.82 03/05/2020   PSA 1.07 03/08/2012   INR 1.0 11/02/2018   HGBA1C 5.2 03/05/2020    CT Chest High Resolution  Result Date: 09/12/2019 CLINICAL DATA:  Bronchiectasis. Abnormal pulmonary function. Productive cough shortness of breath with exertion. EXAM: CT CHEST WITHOUT CONTRAST TECHNIQUE: Multidetector CT imaging of the chest was performed following the standard protocol without intravenous contrast. High resolution imaging of the lungs, as well as inspiratory and expiratory imaging, was performed. COMPARISON:  CT abdomen pelvis 11/03/2015. FINDINGS: Cardiovascular: Atherosclerotic calcification of the aorta and coronary arteries. Heart size normal. No pericardial effusion. Right and left pulmonary arteries are enlarged. Mediastinum/Nodes: Low-attenuation lesion in the right thyroid measures 11 mm. No followup recommended (ref: J Am Coll Radiol. 2015 Feb;12(2): 143-50). Mediastinal and axillary lymph nodes are not enlarged by CT size criteria. Hilar regions are difficult to evaluate without. Esophagus is grossly unremarkable. Lungs/Pleura: Biapical pleuroparenchymal scarring. Scattered bilateral mucoid impaction and peribronchovascular  nodularity. Findings are worst in the lingula and both lower lobes with superimposed airspace consolidation in the left lower lobe, increased from 11/02/2015. No pleural fluid. Adherent and dependent debris within the airway. Negative for subpleural reticulation, ground-glass, architectural distortion or honeycombing. No air trapping. Upper Abdomen: Visualized portions of the liver, adrenal glands and right kidney are unremarkable. Low-attenuation lesion in the upper pole left kidney measures 1.6 cm and is likely a cyst. Visualized portions of the spleen, pancreas, stomach and bowel are otherwise unremarkable with the exception of a small hiatal hernia. No upper abdominal adenopathy. Musculoskeletal: No worrisome lytic or sclerotic lesions. Mild compression of the T2 and T3 vertebral bodies, age indeterminate. IMPRESSION: 1. No evidence of fibrotic interstitial lung disease. 2. Increasing consolidation in the left lower lobe may be due to superimposed pneumonia. Malignancy cannot be excluded. 3. Multi lobar bronchiectasis, mucoid impaction and peribronchovascular nodularity, worst in the lingula and lower lobes. Findings can be seen with an atypical infectious process such as chronic mycobacterium avium complex. 4. Mild compression of the T2 and T3 vertebral bodies, age indeterminate. 5. Aortic atherosclerosis (ICD10-I70.0). Coronary artery calcification. 6. Enlarged pulmonary arteries, indicative of pulmonary arterial hypertension. Electronically Signed   By: Lorin Picket M.D.   On: 09/12/2019 12:55    DG Chest 2 View  Result Date: 03/05/2020 CLINICAL DATA:  Cough, asthma, hypertension EXAM: CHEST - 2 VIEW COMPARISON:  08/04/2019 FINDINGS: Frontal and lateral views of the chest demonstrate a stable cardiac silhouette. Lungs are hyperinflated with bilateral scarring consistent with emphysema. Scarring is most pronounced within the left lower lobe, stable. There is patchy right perihilar airspace disease, new  since prior exam. No effusion or pneumothorax. No acute bony abnormalities. IMPRESSION: 1. Patchy right perihilar airspace disease which could reflect early bronchopneumonia. 2. Extensive background scarring and emphysema, stable. Electronically Signed   By: Randa Ngo M.D.   On: 03/05/2020 16:16   DG Lumbar Spine Complete  Result Date: 03/05/2020 CLINICAL DATA:  Chronic low back pain EXAM: LUMBAR SPINE - COMPLETE 4+ VIEW COMPARISON:  11/03/2016 FINDINGS: Frontal, bilateral oblique, lateral views of the lumbar spine are obtained. Based on previous numbering convention scum a there are chronic compression deformities of L2 and L5. Since the previous exam, there is age indeterminate compression deformity of the superior endplate of the L3 vertebral body. No evidence of retropulsion. There is diffuse facet hypertrophy. Mild  spondylosis at L1-2 and L3-4. Sacroiliac joints are normal. IMPRESSION: 1. Age indeterminate compression deformity superior endplate L3, new since 5732. 2. Chronic L2 and L5 compression deformities. 3. Multilevel spondylosis and facet hypertrophy. Electronically Signed   By: Randa Ngo M.D.   On: 03/05/2020 16:19   DG Abd 2 Views  Result Date: 03/05/2020 CLINICAL DATA:  Weight loss, COPD, hypertension EXAM: ABDOMEN - 2 VIEW COMPARISON:  11/02/2015 FINDINGS: Supine and upright frontal views of the abdomen and pelvis demonstrate an unremarkable bowel gas pattern. Calcifications in the right mid abdomen are likely vascular. No free gas in the greater peritoneal sac. Chronic scarring within the left lower lobe. No acute bony abnormalities. IMPRESSION: 1. Unremarkable bowel gas pattern. Electronically Signed   By: Randa Ngo M.D.   On: 03/05/2020 16:21    Assessment & Plan:   Don was seen today for cough and back pain.  Diagnoses and all orders for this visit:  Acute bilateral low back pain without sciatica- Based on his symptoms, exam, and plain films it looks like he has  a new fracture in the lumbar spine in addition to his old fractures.  I recommended that he treat this with oxycodone as needed.  I also recommend an MRI of the lumbar spine to see if this is a pathological fracture and to evaluate for infection. -     CBC with Differential/Platelet; Future -     DG Lumbar Spine Complete; Future -     CBC with Differential/Platelet -     oxyCODONE (OXY IR/ROXICODONE) 5 MG immediate release tablet; Take 1 tablet (5 mg total) by mouth every 4 (four) hours as needed for severe pain. -     MR Lumbar Spine Wo Contrast; Future  Cough- His chest x-ray is remarkable for a patchy infiltrate in the right midlung. -     DG Chest 2 View; Future  Constipation, unspecified constipation type- This is likely related to poor p.o. intake. -     Basic metabolic panel; Future -     Magnesium; Future -     Cancel: DG ABD ACUTE 2+V W 1V CHEST; Future -     Magnesium -     Basic metabolic panel  Weight loss, abnormal- I do not see any cause for this based on his other symptoms, exam, and labs.  His daughter will encourage him to consume more calories. -     Hepatic function panel; Future -     TSH; Future -     CBC with Differential/Platelet; Future -     DG Chest 2 View; Future -     DG Lumbar Spine Complete; Future -     Cancel: DG ABD ACUTE 2+V W 1V CHEST; Future -     CBC with Differential/Platelet -     TSH -     Hepatic function panel  Urinary frequency- I do not see a secondary cause for this. -     Basic metabolic panel; Future -     Hemoglobin A1c; Future -     Urinalysis, Routine w reflex microscopic; Future -     Urinalysis, Routine w reflex microscopic -     Hemoglobin A1c -     Basic metabolic panel  Closed wedge compression fracture of L3 vertebra, initial encounter (HCC) -     oxyCODONE (OXY IR/ROXICODONE) 5 MG immediate release tablet; Take 1 tablet (5 mg total) by mouth every 4 (four) hours as needed for severe pain. -  MR Lumbar Spine Wo Contrast;  Future  Pneumonia of right middle lobe due to infectious organism -     cefdinir (OMNICEF) 300 MG capsule; Take 1 capsule (300 mg total) by mouth 2 (two) times daily for 10 days.  Other orders -     MICROSCOPIC MESSAGE   I have discontinued Treshun Wold cetirizine, guaiFENesin, triamcinolone, and tamsulosin. I am also having him start on oxyCODONE and cefdinir. Additionally, I am having him maintain his aspirin EC, [employer], acetaminophen, albuterol, promethazine-dextromethorphan, ipratropium-albuterol, Flutter, QUEtiapine, amLODipine, and donepezil.  Meds ordered this encounter  Medications   oxyCODONE (OXY IR/ROXICODONE) 5 MG immediate release tablet    Sig: Take 1 tablet (5 mg total) by mouth every 4 (four) hours as needed for severe pain.    Dispense:  45 tablet    Refill:  0   cefdinir (OMNICEF) 300 MG capsule    Sig: Take 1 capsule (300 mg total) by mouth 2 (two) times daily for 10 days.    Dispense:  20 capsule    Refill:  0   I spent 60 minutes in preparing to see the patient by review of recent labs, imaging and procedures, obtaining and reviewing separately obtained history, communicating with the patient and family or caregiver, ordering medications, tests or procedures, and documenting clinical information in the EHR including the differential Dx, treatment, and any further evaluation and other management of 1. Acute bilateral low back pain without sciatica 2. Cough 3. Constipation, unspecified constipation type 4. Weight loss, abnormal 5. Urinary frequency 6. Closed wedge compression fracture of L3 vertebra, initial encounter ([place]) 7. Pneumonia of right middle lobe due to infectious organism      Follow-up: Return in about 3 weeks (around 03/26/2020).  Scarlette Calico, MD

## 2020-03-06 ENCOUNTER — Telehealth: Payer: Self-pay | Admitting: Internal Medicine

## 2020-03-06 LAB — BASIC METABOLIC PANEL
BUN/Creatinine Ratio: 24 (calc) — ABNORMAL HIGH (ref 6–22)
BUN: 26 mg/dL — ABNORMAL HIGH (ref 7–25)
CO2: 30 mmol/L (ref 20–32)
Calcium: 9.2 mg/dL (ref 8.6–10.3)
Chloride: 100 mmol/L (ref 98–110)
Creat: 1.09 mg/dL (ref 0.70–1.11)
Glucose, Bld: 95 mg/dL (ref 65–99)
Potassium: 3.9 mmol/L (ref 3.5–5.3)
Sodium: 140 mmol/L (ref 135–146)

## 2020-03-06 LAB — CBC WITH DIFFERENTIAL/PLATELET
Absolute Monocytes: 570 cells/uL (ref 200–950)
Basophils Absolute: 40 cells/uL (ref 0–200)
Basophils Relative: 0.6 %
Eosinophils Absolute: 248 cells/uL (ref 15–500)
Eosinophils Relative: 3.7 %
HCT: 41.6 % (ref 38.5–50.0)
Hemoglobin: 13.3 g/dL (ref 13.2–17.1)
Lymphs Abs: 965 cells/uL (ref 850–3900)
MCH: 25.4 pg — ABNORMAL LOW (ref 27.0–33.0)
MCHC: 32 g/dL (ref 32.0–36.0)
MCV: 79.5 fL — ABNORMAL LOW (ref 80.0–100.0)
MPV: 9.8 fL (ref 7.5–12.5)
Monocytes Relative: 8.5 %
Neutro Abs: 4878 cells/uL (ref 1500–7800)
Neutrophils Relative %: 72.8 %
Platelets: 218 10*3/uL (ref 140–400)
RBC: 5.23 10*6/uL (ref 4.20–5.80)
RDW: 14.7 % (ref 11.0–15.0)
Total Lymphocyte: 14.4 %
WBC: 6.7 10*3/uL (ref 3.8–10.8)

## 2020-03-06 LAB — URINALYSIS, ROUTINE W REFLEX MICROSCOPIC
Bacteria, UA: NONE SEEN /HPF
Bilirubin Urine: NEGATIVE
Glucose, UA: NEGATIVE
Hyaline Cast: NONE SEEN /LPF
Ketones, ur: NEGATIVE
Nitrite: NEGATIVE
Specific Gravity, Urine: 1.015 (ref 1.001–1.03)
pH: 6.5 (ref 5.0–8.0)

## 2020-03-06 LAB — HEPATIC FUNCTION PANEL
AG Ratio: 1.1 (calc) (ref 1.0–2.5)
ALT: 11 U/L (ref 9–46)
AST: 16 U/L (ref 10–35)
Albumin: 3.6 g/dL (ref 3.6–5.1)
Alkaline phosphatase (APISO): 78 U/L (ref 35–144)
Bilirubin, Direct: 0.1 mg/dL (ref 0.0–0.2)
Globulin: 3.4 g/dL (calc) (ref 1.9–3.7)
Indirect Bilirubin: 0.4 mg/dL (calc) (ref 0.2–1.2)
Total Bilirubin: 0.5 mg/dL (ref 0.2–1.2)
Total Protein: 7 g/dL (ref 6.1–8.1)

## 2020-03-06 LAB — HEMOGLOBIN A1C
Hgb A1c MFr Bld: 5.2 % of total Hgb (ref <5.7)
Mean Plasma Glucose: 103 (calc)
eAG (mmol/L): 5.7 (calc)

## 2020-03-06 LAB — TSH: TSH: 0.82 mIU/L (ref 0.40–4.50)

## 2020-03-06 LAB — MAGNESIUM: Magnesium: 2.1 mg/dL (ref 1.5–2.5)

## 2020-03-06 NOTE — Telephone Encounter (Signed)
Pt dtr contacted and informed that the medications that were added at the appointment yesterday where not contraindicated with patients current medications. Pt dtr stated that she did not understand why pt had a normal temp on the bus and then he all of a sudden had a fever when he arrived to day care.  I explained to dtr that pt was diagnosed with PNA and fx yesterday and that could cause fever. Pt dtr stated understanding and also stated that she will keep pt out of daycare for the remainder of the week so he can rest.

## 2020-03-06 NOTE — Telephone Encounter (Signed)
New message:    [name] son is calling and would like to know exactly what medication was prescribed for the pt when he saw Dr. Ronnald Ramp on yesterday. She states the pt has taken the medication and all of a sudden he is running a fever and some other things are going on. She states she was to make sure that this new medication is not causing conflicts with what he already currently takes. She would also like to get the pt's lab results that was done one yesterday. Please advise.

## 2020-03-13 ENCOUNTER — Telehealth: Payer: Self-pay

## 2020-03-13 NOTE — Telephone Encounter (Signed)
Spoke with pts daughter and she has stated she would like to come in with her Dad ASAP due to the abnormal findings /with pts X-rays done on 03/05/2020. Pt is scheduled to come back in on Friday 03/16/2020 @ 9:20am.

## 2020-03-13 NOTE — Telephone Encounter (Signed)
New message    Daughter [name] has questions regarding last office visit on 7.12.21 asking for a call back today.

## 2020-03-16 ENCOUNTER — Other Ambulatory Visit: Payer: Self-pay

## 2020-03-16 ENCOUNTER — Ambulatory Visit (INDEPENDENT_AMBULATORY_CARE_PROVIDER_SITE_OTHER): Payer: Medicare HMO | Admitting: Internal Medicine

## 2020-03-16 ENCOUNTER — Encounter: Payer: Self-pay | Admitting: Internal Medicine

## 2020-03-16 VITALS — BP 122/80 | HR 59 | Temp 98.0°F | Ht 66.0 in | Wt 133.0 lb

## 2020-03-16 DIAGNOSIS — J449 Chronic obstructive pulmonary disease, unspecified: Secondary | ICD-10-CM

## 2020-03-16 DIAGNOSIS — G309 Alzheimer's disease, unspecified: Secondary | ICD-10-CM | POA: Diagnosis not present

## 2020-03-16 DIAGNOSIS — Z0001 Encounter for general adult medical examination with abnormal findings: Secondary | ICD-10-CM | POA: Diagnosis not present

## 2020-03-16 DIAGNOSIS — J189 Pneumonia, unspecified organism: Secondary | ICD-10-CM

## 2020-03-16 DIAGNOSIS — R3129 Other microscopic hematuria: Secondary | ICD-10-CM | POA: Diagnosis not present

## 2020-03-16 DIAGNOSIS — I10 Essential (primary) hypertension: Secondary | ICD-10-CM

## 2020-03-16 DIAGNOSIS — S32030A Wedge compression fracture of third lumbar vertebra, initial encounter for closed fracture: Secondary | ICD-10-CM

## 2020-03-16 DIAGNOSIS — F0281 Dementia in other diseases classified elsewhere with behavioral disturbance: Secondary | ICD-10-CM

## 2020-03-16 MED ORDER — HYDROCODONE-ACETAMINOPHEN 7.5-325 MG PO TABS: 1.0000 | ORAL_TABLET | Freq: Four times a day (QID) | ORAL | 0 refills | Status: DC | PRN

## 2020-03-16 MED ORDER — QUETIAPINE FUMARATE 25 MG PO TABS: ORAL_TABLET | ORAL | 1 refills | Status: DC

## 2020-03-16 NOTE — Patient Instructions (Addendum)
Please go to the local drug store for the Lithium shot, as this would be great to have done  We will fax the information today as you requested  Ok to change the oxycodone to the hydrocodone for pain and hopefully less sedation  Ok to take the seroquel at 25 mg twice per day as you have been doing  The pneumonia is resolved today; there is no need to stop going to the adult day care  Please remember to have the MRI as has been ordered for the new fracture to the lower back  Please continue all other medications as before, and refills have been done if requested.  Please have the pharmacy call with any other refills you may need.  Please continue your efforts at being more active, low cholesterol diet, and weight control.  You are otherwise up to date with prevention measures today.  Please keep your appointments with your specialists as you may have planned  You will be contacted regarding the referral for: Urology, due to the small blood in the urine  Please make an Appointment to return in 6 months, or sooner if needed

## 2020-03-16 NOTE — Progress Notes (Signed)
Subjective:    Patient ID: Matthew Gray, male    DOB: 1934/07/03, 86 y.o.   MRN: [id number]  HPI   Here for wellness and f/u;  Overall doing ok;  Pt denies Chest pain, worsening SOB, DOE, wheezing, orthopnea, PND, worsening LE edema, palpitations, dizziness or syncope.  Pt denies neurological change such as new headache, facial or extremity weakness.  Pt denies polydipsia, polyuria, or low sugar symptoms. Pt states overall good compliance with treatment and medications, good tolerability, and has been trying to follow appropriate diet.  Pt denies worsening depressive symptoms, suicidal ideation or panic. No fever, night sweats, wt loss, loss of appetite, or other constitutional symptoms.  Pt states good ability with ADL's, has low fall risk, home safety reviewed and adequate, no other significant changes in hearing or vision, and only occasionally active with exercise. Also Saw Dr Ronnald Ramp March 05, 2020, has lumbar compression fx, and now for MRI lumbar, as well as RLL pna and finished antibx yesterday, now without fever, chills or worsening cough or wheezing.  Also Denies urinary symptoms such as dysuria, frequency, urgency, flank pain, hematuria or n/v, fever, chills. Family also asks for decreased seroquel as has been doing well on 25 bid and more in the PM seems to be too much sedation. Past Medical History:  Diagnosis Date  . ALLERGIC RHINITIS 10/27/2007  . ANKLE PAIN, RIGHT 11/30/2009  . ASTHMA 10/27/2007  . BACK PAIN 10/08/2009  . BENIGN PROSTATIC HYPERTROPHY 10/27/2007  . CHRONIC OBSTRUCTIVE PULMONARY DISEASE, ACUTE EXACERBATION 11/22/2007  . COLONIC POLYPS, HX OF 10/27/2007  . COPD 10/27/2007  . CORONARY ARTERY DISEASE 10/27/2007  . Dementia ([place]) 01/13/2013  . DEPRESSION 11/30/2009  . FATIGUE 11/30/2009  . Hemoptysis 04/24/2009  . HYPERLIPIDEMIA 04/03/2007  . HYPERTENSION 04/03/2007  . MYALGIA 02/04/2008  . Obstructive chronic bronchitis without exacerbation ([place])   . Pain in Soft Tissues of Limb 02/04/2008  .  PARESTHESIA 09/02/2010  . PERIPHERAL EDEMA 11/16/2009  . Peripheral neuropathy 03/08/2012  . PERIPHERAL VASCULAR DISEASE 10/27/2007  . 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 RASH-NONVESICULAR 10/27/2007  . SCIATICA, LEFT 11/08/2009  . SHOULDER PAIN, LEFT 06/21/2009  . SINUSITIS- ACUTE-NOS 10/08/2009  . VITAMIN D DEFICIENCY 06/04/2010   Past Surgical History:  Procedure Laterality Date  . None      reports that he has never smoked. He has never used smokeless tobacco. He reports that he does not drink alcohol and does not use drugs. family history includes Alcohol abuse in an other family member; Anuerysm in his brother; Cancer in his father, mother, and another family member; Hypertension in his sister; Stroke in an other family member. Allergies  Allergen Reactions  . Atorvastatin Other (See Comments)    REACTION: myalgias  . Simvastatin Other (See Comments)    REACTION: myalgias  . Zetia [Ezetimibe] Other (See Comments)    weakness  . Ace Inhibitors Other (See Comments)    unknown  . Penicillins Other (See Comments)    Unknown;  Tolerated Ceftriaxone 10/2018   Current Outpatient Medications on File Prior to Visit  Medication Sig Dispense Refill  . acetaminophen (TYLENOL) 325 MG tablet Take 2 tablets (650 mg total) by mouth every 6 (six) hours as needed for mild pain (or Fever >/= 101). 30 tablet 0  . amLODipine (NORVASC) 5 MG tablet TAKE 1 TABLET EVERY DAY 90 tablet 2  . aspirin EC 81 MG tablet Take 1 tablet (81 mg total) by mouth daily. 90 tablet 11  . donepezil (ARICEPT)  10 MG tablet TAKE 1 TABLET EVERY DAY  AT  2AM 90 tablet 1  . ipratropium-albuterol (DUONEB) 0.5-2.5 (3) MG/3ML SOLN Take 3 mLs by nebulization every 6 (six) hours as needed (for cough or shortness of breath). 360 mL 3  . Multiple Vitamins-Minerals (ALIVE MENS ENERGY) TABS Take 1 tablet by mouth daily.    Marland Kitchen oxyCODONE (OXY IR/ROXICODONE) 5 MG immediate release tablet Take 1 tablet (5 mg total) by mouth every 4 (four)  hours as needed for severe pain. 45 tablet 0  . promethazine-dextromethorphan (PROMETHAZINE-DM) 6.25-15 MG/5ML syrup Take 5 mLs by mouth 4 (four) times daily as needed for cough. 118 mL 5  . Respiratory Therapy Supplies (FLUTTER) DEVI 1 Device by Does not apply route as needed. 1 each 0   No current facility-administered medications on file prior to visit.   Review of Systems All otherwise neg per pt     Objective:   Physical Exam BP 122/80 (BP Location: Left Arm, Patient Position: Sitting, Cuff Size: Large)   Pulse 59   Temp 98 F (36.7 C) (Oral)   Ht 5\' 6"  (1.676 m)   Wt 133 lb (60.3 kg)   SpO2 99%   BMI 21.47 kg/m  VS noted,  Constitutional: Pt appears in NAD HENT: Head: NCAT.  Right Ear: External ear normal.  Left Ear: External ear normal.  Eyes: . Pupils are equal, round, and reactive to light. Conjunctivae and EOM are normal Nose: without d/c or deformity Neck: Neck supple. Gross normal ROM Cardiovascular: Normal rate and regular rhythm.   Pulmonary/Chest: Effort normal and breath sounds without rales or wheezing.  Abd:  Soft, NT, ND, + BS, no organomegaly Neurological: Pt is alert. At baseline orientation, motor grossly intact Skin: Skin is warm. No rashes, other new lesions, no LE edema Psychiatric: Pt behavior is normal without agitation  All otherwise neg per pt Lab Results  Component Value Date   WBC 6.7 03/05/2020   HGB 13.3 03/05/2020   HCT 41.6 03/05/2020   PLT 218 03/05/2020   GLUCOSE 95 03/05/2020   CHOL 207 (H) 10/14/2018   TRIG 93.0 10/14/2018   HDL 53.40 10/14/2018   LDLDIRECT 131.5 06/24/2006   LDLCALC 135 (H) 10/14/2018   ALT 11 03/05/2020   AST 16 03/05/2020   NA 140 03/05/2020   K 3.9 03/05/2020   CL 100 03/05/2020   CREATININE 1.09 03/05/2020   BUN 26 (H) 03/05/2020   CO2 30 03/05/2020   TSH 0.82 03/05/2020   PSA 1.07 03/08/2012   INR 1.0 11/02/2018   HGBA1C 5.2 03/05/2020      Assessment & Plan:

## 2020-03-17 ENCOUNTER — Encounter: Payer: Self-pay | Admitting: Internal Medicine

## 2020-03-17 DIAGNOSIS — R3129 Other microscopic hematuria: Secondary | ICD-10-CM | POA: Insufficient documentation

## 2020-03-17 NOTE — Assessment & Plan Note (Addendum)
For MRI soon,  to f/u any worsening symptoms or concerns  I spent 41 minutes in addition to time for CPX wellness examination in preparing to see the patient by review of recent labs, imaging and procedures, obtaining and reviewing separately obtained history, communicating with the patient and family or caregiver, ordering medications, tests or procedures, and documenting clinical information in the EHR including the differential Dx, treatment, and any further evaluation and other management of dementia, copd, rll pna, htn, lumbar compression fx, microhematuria

## 2020-03-17 NOTE — Assessment & Plan Note (Signed)
Asympt, for urology referral

## 2020-03-17 NOTE — Assessment & Plan Note (Signed)
[place] for reduced seroquel 25 bid

## 2020-03-17 NOTE — Assessment & Plan Note (Signed)
stable overall by history and exam, recent data reviewed with pt, and pt to continue medical treatment as before,  to f/u any worsening symptoms or concerns  

## 2020-03-17 NOTE — Assessment & Plan Note (Signed)

## 2020-03-20 IMAGING — DX PORTABLE CHEST - 1 VIEW
1 series · 1 of 1 positions shown · non-contrast
Comparison: Two days ago

CLINICAL DATA: Shortness of breath

EXAM:
PORTABLE CHEST 1 VIEW

[chest ap]
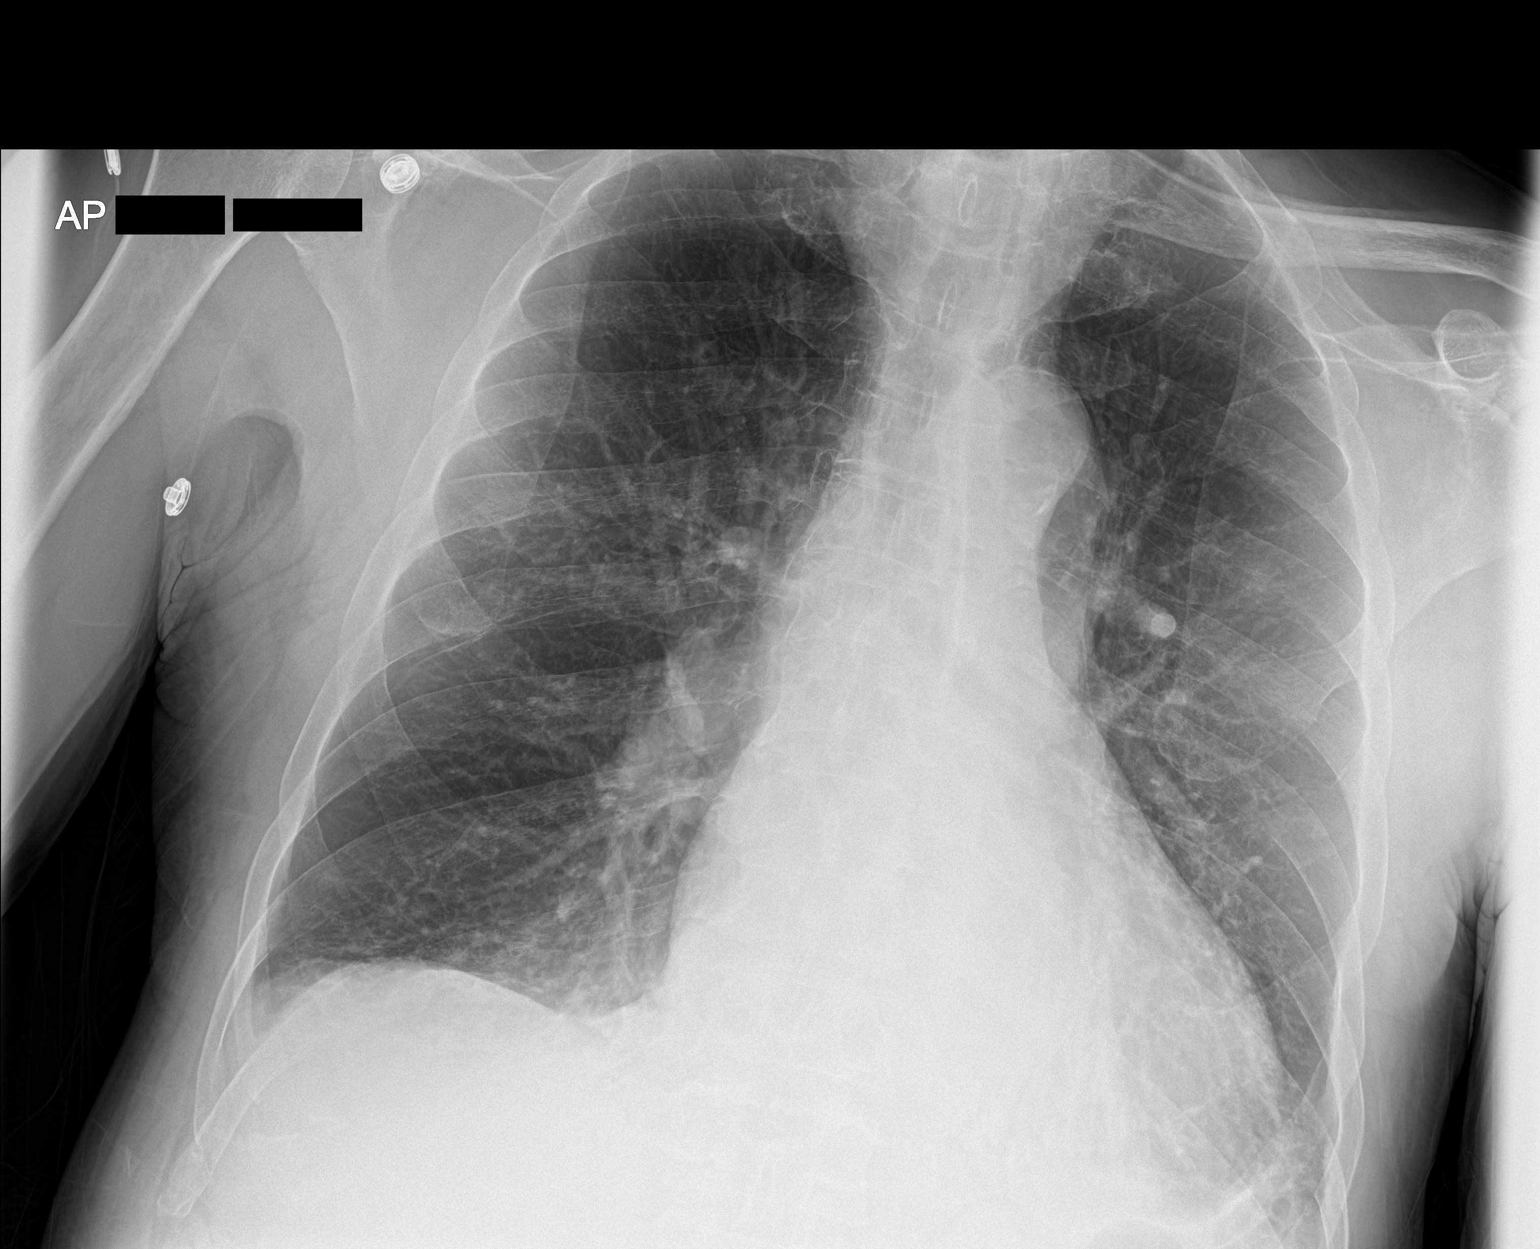

[1 of 1 positions shown; findings below may reference images not displayed]

FINDINGS: Emphysema. Unchanged retrocardiac more than right lower lobe
opacity. Trace pleural effusions. Chronic cardiomegaly.
IMPRESSION: 1. Unchanged lower lobe airspace disease.
2. COPD.

## 2020-03-24 DIAGNOSIS — J449 Chronic obstructive pulmonary disease, unspecified: Secondary | ICD-10-CM | POA: Diagnosis not present

## 2020-03-26 ENCOUNTER — Ambulatory Visit: Payer: Medicare HMO | Admitting: Internal Medicine

## 2020-03-30 ENCOUNTER — Emergency Department (HOSPITAL_COMMUNITY)
Admission: EM | Admit: 2020-03-30 | Discharge: 2020-03-30 | Disposition: A | Discharge status: Home / Self Care | Payer: Medicare HMO | Attending: Emergency Medicine | Admitting: Emergency Medicine

## 2020-03-30 ENCOUNTER — Emergency Department (HOSPITAL_COMMUNITY): Payer: Medicare HMO

## 2020-03-30 DIAGNOSIS — Z7982 Long term (current) use of aspirin: Secondary | ICD-10-CM | POA: Insufficient documentation

## 2020-03-30 DIAGNOSIS — R404 Transient alteration of awareness: Secondary | ICD-10-CM | POA: Diagnosis not present

## 2020-03-30 DIAGNOSIS — Z79899 Other long term (current) drug therapy: Secondary | ICD-10-CM | POA: Insufficient documentation

## 2020-03-30 DIAGNOSIS — R4020 Unspecified coma: Secondary | ICD-10-CM | POA: Diagnosis not present

## 2020-03-30 DIAGNOSIS — I4510 Unspecified right bundle-branch block: Secondary | ICD-10-CM | POA: Diagnosis not present

## 2020-03-30 DIAGNOSIS — J189 Pneumonia, unspecified organism: Secondary | ICD-10-CM | POA: Diagnosis not present

## 2020-03-30 DIAGNOSIS — M5489 Other dorsalgia: Secondary | ICD-10-CM | POA: Diagnosis not present

## 2020-03-30 DIAGNOSIS — I2510 Atherosclerotic heart disease of native coronary artery without angina pectoris: Secondary | ICD-10-CM | POA: Insufficient documentation

## 2020-03-30 DIAGNOSIS — J441 Chronic obstructive pulmonary disease with (acute) exacerbation: Secondary | ICD-10-CM | POA: Diagnosis not present

## 2020-03-30 DIAGNOSIS — J45909 Unspecified asthma, uncomplicated: Secondary | ICD-10-CM | POA: Diagnosis not present

## 2020-03-30 DIAGNOSIS — R55 Syncope and collapse: Secondary | ICD-10-CM | POA: Insufficient documentation

## 2020-03-30 DIAGNOSIS — I10 Essential (primary) hypertension: Secondary | ICD-10-CM | POA: Insufficient documentation

## 2020-03-30 LAB — COMPREHENSIVE METABOLIC PANEL
ALT: 14 U/L (ref 0–44)
AST: 25 U/L (ref 15–41)
Albumin: 3.1 g/dL — ABNORMAL LOW (ref 3.5–5.0)
Alkaline Phosphatase: 96 U/L (ref 38–126)
Anion gap: 14 (ref 5–15)
BUN: 16 mg/dL (ref 8–23)
CO2: 23 mmol/L (ref 22–32)
Calcium: 9.3 mg/dL (ref 8.9–10.3)
Chloride: 101 mmol/L (ref 98–111)
Creatinine, Ser: 1.1 mg/dL (ref 0.61–1.24)
GFR calc Af Amer: >60 mL/min (ref >60)
GFR calc non Af Amer: >60 mL/min (ref >60)
Glucose, Bld: 77 mg/dL (ref 70–99)
Potassium: 4.5 mmol/L (ref 3.5–5.1)
Sodium: 138 mmol/L (ref 135–145)
Total Bilirubin: 0.6 mg/dL (ref 0.3–1.2)
Total Protein: 7.5 g/dL (ref 6.5–8.1)

## 2020-03-30 LAB — URINALYSIS, ROUTINE W REFLEX MICROSCOPIC
Bilirubin Urine: NEGATIVE
Glucose, UA: NEGATIVE mg/dL
Ketones, ur: NEGATIVE mg/dL
Leukocytes,Ua: NEGATIVE
Nitrite: NEGATIVE
Protein, ur: 100 mg/dL — AB
RBC / HPF: >50 RBC/hpf — ABNORMAL HIGH (ref 0–5)
Specific Gravity, Urine: 1.011 (ref 1.005–1.030)
pH: 7 (ref 5.0–8.0)

## 2020-03-30 LAB — CBC
HCT: 44.7 % (ref 39.0–52.0)
Hemoglobin: 13.6 g/dL (ref 13.0–17.0)
MCH: 25.1 pg — ABNORMAL LOW (ref 26.0–34.0)
MCHC: 30.4 g/dL (ref 30.0–36.0)
MCV: 82.5 fL (ref 80.0–100.0)
Platelets: 221 10*3/uL (ref 150–400)
RBC: 5.42 MIL/uL (ref 4.22–5.81)
RDW: 15.6 % — ABNORMAL HIGH (ref 11.5–15.5)
WBC: 6.7 10*3/uL (ref 4.0–10.5)
nRBC: 0 % (ref 0.0–0.2)

## 2020-03-30 LAB — TROPONIN I (HIGH SENSITIVITY): Troponin I (High Sensitivity): 5 ng/L (ref <18)

## 2020-03-30 NOTE — ED Triage Notes (Signed)
[facility] after witnessed syncopal episode lasting a few seconds. Pt aroused when moving to floor at church. Dementia at baseline, alert and oriented to self. VSS en route, NAD. Pt alert in ED, asking to use restroom. EDP at bedside.

## 2020-03-30 NOTE — ED Provider Notes (Signed)
[facility] Provider Note   CSN: [id number] Arrival date & time: 03/30/20  1149     History Chief Complaint  Patient presents with  . Loss of Consciousness    Hence Derrick is a 86 y.o. male.  The history is provided by the patient, the EMS personnel and a relative.  Loss of Consciousness Episode history:  Single Most recent episode:  Today Timing:  Rare Progression:  Resolved Chronicity:  New Context: sitting down   Witnessed: yes   Relieved by:  Nothing Worsened by:  Nothing Ineffective treatments:  None tried Associated symptoms: no chest pain, no fever, no palpitations, no seizures, no shortness of breath and no vomiting    86 year old male with history of coronary artery disease, dementia presents with syncope.  Patient was sitting in his wheelchair while at adult daycare when his family noted that he was unconscious and having "agonal" respirations.  Upon EMS arrival patient was alert and oriented to self and place which is his baseline.  Patient denies any chest pain, shortness of breath, fever, chills, abdominal pain, nausea, vomiting.  Bgl, EKG, vitals al WNL w/ EMS.    Past Medical History:  Diagnosis Date  . ALLERGIC RHINITIS 10/27/2007  . ANKLE PAIN, RIGHT 11/30/2009  . ASTHMA 10/27/2007  . BACK PAIN 10/08/2009  . BENIGN PROSTATIC HYPERTROPHY 10/27/2007  . CHRONIC OBSTRUCTIVE PULMONARY DISEASE, ACUTE EXACERBATION 11/22/2007  . COLONIC POLYPS, HX OF 10/27/2007  . COPD 10/27/2007  . CORONARY ARTERY DISEASE 10/27/2007  . Dementia ([place]) 01/13/2013  . DEPRESSION 11/30/2009  . FATIGUE 11/30/2009  . Hemoptysis 04/24/2009  . HYPERLIPIDEMIA 04/03/2007  . HYPERTENSION 04/03/2007  . MYALGIA 02/04/2008  . Obstructive chronic bronchitis without exacerbation ([place])   . Pain in Soft Tissues of Limb 02/04/2008  . PARESTHESIA 09/02/2010  . PERIPHERAL EDEMA 11/16/2009  . Peripheral neuropathy 03/08/2012  . PERIPHERAL VASCULAR DISEASE 10/27/2007  . Postherpetic  polyneuropathy 08/15/2008  . RASH-NONVESICULAR 10/27/2007  . SCIATICA, LEFT 11/08/2009  . SHOULDER PAIN, LEFT 06/21/2009  . SINUSITIS- ACUTE-NOS 10/08/2009  . VITAMIN D DEFICIENCY 06/04/2010    Patient Active Problem List   Diagnosis Date Noted  . Microhematuria 03/17/2020  . Acute bilateral low back pain without sciatica 03/05/2020  . Constipation 03/05/2020  . Weight loss, abnormal 03/05/2020  . Closed wedge compression fracture of third lumbar vertebra ([place]) 03/05/2020  . Pneumonia of right middle lobe due to infectious organism 03/05/2020  . Abnormal findings on diagnostic imaging of lung 10/06/2019  . Urinary frequency 08/04/2019  . Community acquired pneumonia 11/02/2018  . Pneumonia 11/02/2018  . COPD exacerbation ([place]) 10/14/2018  . Influenza-like illness 10/04/2018  . Urinary incontinence 07/19/2018  . Fatigue 10/22/2017  . Hypokalemia 07/29/2017  . Driving safety issue 04/21/2017  . Acute right ankle pain 08/16/2016  . Bilateral hand pain 03/30/2016  . Bilateral hip pain 03/27/2016  . Loose stools 03/27/2016  . Low back pain 11/13/2015  . Cough 02/06/2015  . Weakness 07/19/2014  . Right inguinal hernia 03/04/2013  . Dementia, Alzheimer's, with behavior disturbance ([place]) 01/13/2013  . Obstructive chronic bronchitis without exacerbation ([place])   . Polyneuropathy in other diseases classified elsewhere ([place])   . PVD (peripheral vascular disease) ([place]) 05/04/2012  . Peripheral neuropathy 03/08/2012  . Bilateral leg pain 03/08/2012  . Peripheral edema 09/10/2011  . Encounter for well adult exam with abnormal findings 11/22/2010  . Weight loss 11/22/2010  . Anxiety 11/22/2010  . PARESTHESIA 09/02/2010  . Vitamin D deficiency  06/04/2010  . Depression 11/30/2009  . ANKLE PAIN, RIGHT 11/30/2009  . FATIGUE 11/30/2009  . PERIPHERAL EDEMA 11/16/2009  . SCIATICA, LEFT 11/08/2009  . Backache 10/08/2009  . SHOULDER PAIN, LEFT 06/21/2009  . Hemoptysis 04/24/2009  .  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 MYALGIA 02/04/2008  . Pain in Soft Tissues of Limb 02/04/2008  . Coronary atherosclerosis 10/27/2007  . PERIPHERAL VASCULAR DISEASE 10/27/2007  . Allergic rhinitis 10/27/2007  . Asthma 10/27/2007  . COPD (chronic obstructive pulmonary disease) ([place]) 10/27/2007  . BENIGN PROSTATIC HYPERTROPHY 10/27/2007  . RASH-NONVESICULAR 10/27/2007  . COLONIC POLYPS, HX OF 10/27/2007  . Hyperlipidemia 04/03/2007  . Essential hypertension 04/03/2007    Past Surgical History:  Procedure Laterality Date  . None         Family History  Problem Relation Age of Onset  . Cancer Mother        BREAST  . Cancer Father        LUNG  . Hypertension Sister        3 sisters  . Anuerysm Brother        died with  . Stroke Other   . Cancer Other        lung and breast  . Alcohol abuse Other     Social History   Tobacco Use  . Smoking status: Never Smoker  . Smokeless tobacco: Never Used  . Tobacco comment: Quit ten years ago.  Substance Use Topics  . Alcohol use: No  . Drug use: No    Home Medications Prior to Admission medications   Medication Sig Start Date End Date Taking? Authorizing Provider  acetaminophen (TYLENOL) 325 MG tablet Take 2 tablets (650 mg total) by mouth every 6 (six) hours as needed for mild pain (or Fever >/= 101). 11/05/18   Raiford Noble Latif, DO  amLODipine (NORVASC) 5 MG tablet TAKE 1 TABLET EVERY DAY 11/23/19   Biagio Borg, MD  aspirin EC 81 MG tablet Take 1 tablet (81 mg total) by mouth daily. 10/22/17   Biagio Borg, MD  donepezil (ARICEPT) 10 MG tablet TAKE 1 TABLET EVERY DAY  AT  2AM 12/21/19   Biagio Borg, MD  HYDROcodone-acetaminophen [facility]) 7.5-325 MG tablet Take 1 tablet by mouth every 6 (six) hours as needed for moderate pain. 03/16/20   Biagio Borg, MD  ipratropium-albuterol (DUONEB) 0.5-2.5 (3) MG/3ML SOLN Take 3 mLs by nebulization every 6 (six) hours as needed (for cough or shortness of breath). 08/18/19    Candee Furbish, MD  Multiple Vitamins-Minerals (ALIVE MENS ENERGY) TABS Take 1 tablet by mouth daily.    [provider]  oxyCODONE (OXY IR/ROXICODONE) 5 MG immediate release tablet Take 1 tablet (5 mg total) by mouth every 4 (four) hours as needed for severe pain. 03/05/20   Janith Lima, MD  promethazine-dextromethorphan (PROMETHAZINE-DM) 6.25-15 MG/5ML syrup Take 5 mLs by mouth 4 (four) times daily as needed for cough. 08/04/19   Biagio Borg, MD  QUEtiapine (SEROQUEL) 25 MG tablet 1 tab by mouth twice per day 03/16/20   Biagio Borg, MD  Respiratory Therapy Supplies (FLUTTER) DEVI 1 Device by Does not apply route as needed. 09/05/19   Candee Furbish, MD    Allergies    Atorvastatin, Simvastatin, Zetia [ezetimibe], Ace inhibitors, and Penicillins  Review of Systems   Review of Systems  Constitutional: Negative for chills and fever.  HENT: Negative for ear pain and sore throat.   Eyes: Negative for  pain and visual disturbance.  Respiratory: Negative for cough and shortness of breath.   Cardiovascular: Positive for syncope. Negative for chest pain and palpitations.  Gastrointestinal: Negative for abdominal pain and vomiting.  Genitourinary: Negative for dysuria and hematuria.  Musculoskeletal: Negative for arthralgias and back pain.  Skin: Negative for color change and rash.  Neurological: Positive for syncope. Negative for seizures.  All other systems reviewed and are negative.   Physical Exam Updated Vital Signs BP (!) 161/109   Pulse 71   Temp 98.7 F (37.1 C) (Oral)   Resp 18   Ht 5\' 9"  (1.753 m)   Wt 54.4 kg   SpO2 96%   BMI 17.72 kg/m   Physical Exam Vitals and nursing note reviewed.  Constitutional:      Appearance: He is well-developed.  HENT:     Head: Normocephalic and atraumatic.  Eyes:     Conjunctiva/sclera: Conjunctivae normal.  Cardiovascular:     Rate and Rhythm: Normal rate and regular rhythm.     Heart sounds: No murmur heard.    Pulmonary:     Effort: Pulmonary effort is normal. No respiratory distress.     Breath sounds: Normal breath sounds.  Abdominal:     Palpations: Abdomen is soft.     Tenderness: There is no abdominal tenderness. There is no guarding or rebound.  Musculoskeletal:     Cervical back: Neck supple.  Skin:    General: Skin is warm and dry.  Neurological:     General: No focal deficit present.     Mental Status: He is alert. Mental status is at baseline.     Cranial Nerves: No cranial nerve deficit.     Sensory: No sensory deficit.     Motor: No weakness.     Comments: Oriented to self and place     ED Results / Procedures / Treatments   Labs (all labs ordered are listed, but only abnormal results are displayed) Labs Reviewed  CBC - Abnormal; Notable for the following components:      Result Value   MCH 25.1 (*)    RDW 15.6 (*)    All other components within normal limits  URINALYSIS, ROUTINE W REFLEX MICROSCOPIC - Abnormal; Notable for the following components:   APPearance HAZY (*)    Hgb urine dipstick MODERATE (*)    Protein, ur 100 (*)    RBC / HPF >50 (*)    Bacteria, UA RARE (*)    Non Squamous Epithelial 0-5 (*)    All other components within normal limits  COMPREHENSIVE METABOLIC PANEL - Abnormal; Notable for the following components:   Albumin 3.1 (*)    All other components within normal limits  TROPONIN I (HIGH SENSITIVITY)    EKG EKG Interpretation  Date/Time:  Friday March 30 2020 13:38:50 EDT Ventricular Rate:  74 PR Interval:    QRS Duration: 156 QT Interval:  451 QTC Calculation: 501 R Axis:   31 Text Interpretation: Sinus rhythm Right bundle branch block no significant change since 2020 Confirmed by Sherwood Gambler [phone]) on 03/30/2020 1:40:46 PM   Radiology DG Chest 1 View  Result Date: 03/30/2020 CLINICAL DATA:  Syncope, dementia EXAM: CHEST  1 VIEW COMPARISON:  03/05/2020 FINDINGS: The heart size and mediastinal contours are within normal  limits. Atherosclerotic calcification of the aortic knob. Streaky airspace opacities within the bilateral lung bases, right slightly worse than left. No pleural effusion or pneumothorax. The visualized skeletal structures are unremarkable. IMPRESSION: Streaky airspace  opacities within the bilateral lung bases, right slightly worse than left, which may reflect atelectasis, aspiration, or pneumonia. Electronically Signed   By: Davina Poke D.O.   On: 03/30/2020 12:29    Procedures Procedures (including critical care time)  Medications Ordered in ED Medications - No data to display  ED Course  I have reviewed the triage vital signs and the nursing notes.  Pertinent labs & imaging results that were available during my care of the patient were reviewed by me and considered in my medical decision making (see chart for details).    MDM Rules/Calculators/A&P                          86 year old male presents with report of syncope.  Afebrile vital signs stable.  Exam as above.  No focal neuro deficits on physical exam.  Patient alert and oriented to self and place which is his baseline.  Currently denying any symptoms at this time.  Work-up including CMP, CBC, troponin unremarkable.  EKG showed sinus rhythm with right bundle branch block which is stable since 2020.  EKG showed bibasilar opacities concerning for possible atelectasis versus pneumonia.  Upon chart review patient has a history of these opacities and given lack of symptoms including cough, fever, elevated white count do not feel that this represents an active infection.    Spoke with patient's daughter who arrived at the bedside about patient's symptoms.  Daughter states that she feels that patient did not syncopize and potentially fell asleep which he is known to do at home though she was not there at the daycare when this occurred.  Given no focal neurologic deficits do not feel the patient requires CT head at this time.  Low concern  for cardiac cause given normal EKG, troponins, electrolytes.  Do not feel that patient is septic at this time given lack of fever, lack of white count, no focal sign of infection on physical exam.  Do not feel the patient requires further work-up or imaging at this time given his return to baseline, unclear story regarding his loss of consciousness as well as lack of complaints at this time.  This was discussed with patient's daughter who felt comfortable taking patient home with her at this time.  Patient daughter voiced agreement and understand the overall plan.  Patient was discharged in stable condition without further events. Final Clinical Impression(s) / ED Diagnoses Final diagnoses:  Near syncope    Rx / DC Orders ED Discharge Orders    None       Silvestre Gunner, MD 03/30/20 1518    Sherwood Gambler, MD 04/02/20 [phone]

## 2020-03-30 NOTE — ED Notes (Signed)
Daughter at bedside.

## 2020-03-31 ENCOUNTER — Ambulatory Visit
Admission: RE | Admit: 2020-03-31 | Discharge: 2020-03-31 | Disposition: A | Discharge status: Home / Self Care | Payer: Medicare HMO | Source: Ambulatory Visit | Attending: Internal Medicine | Admitting: Internal Medicine

## 2020-03-31 ENCOUNTER — Other Ambulatory Visit: Payer: Self-pay

## 2020-03-31 DIAGNOSIS — S32030A Wedge compression fracture of third lumbar vertebra, initial encounter for closed fracture: Secondary | ICD-10-CM

## 2020-03-31 DIAGNOSIS — M545 Low back pain, unspecified: Secondary | ICD-10-CM

## 2020-03-31 DIAGNOSIS — M48061 Spinal stenosis, lumbar region without neurogenic claudication: Secondary | ICD-10-CM | POA: Diagnosis not present

## 2020-04-03 ENCOUNTER — Other Ambulatory Visit: Payer: Medicare HMO

## 2020-04-05 ENCOUNTER — Telehealth: Payer: Self-pay | Admitting: Internal Medicine

## 2020-04-05 NOTE — Telephone Encounter (Signed)
    Please return call to daughter  [name] to discuss MRI results, call [phone]

## 2020-04-06 NOTE — Telephone Encounter (Signed)
Tried calling pts daughter back to inform her that the MRI shows broken bones in lumbar vertebrae #2 and 3. There is also some degree of spinal stenosis. She needs to let Dr. Ronnald Ramp know how he is doing and whether or not he wants to see a back surgeon about this.

## 2020-04-09 ENCOUNTER — Telehealth: Payer: Self-pay | Admitting: Internal Medicine

## 2020-04-10 ENCOUNTER — Telehealth: Payer: Self-pay | Admitting: Internal Medicine

## 2020-04-10 NOTE — Telephone Encounter (Signed)
[place] for order  Please be aware I will only do this once, and will not respond to further messages on this topic

## 2020-04-10 NOTE — Telephone Encounter (Signed)
    Daughter calling to request order be written for patient ." drink 2% milk only, not whole milk" while at adult daycare  [facility] Phone [phone] Fax [phone]

## 2020-04-10 NOTE — Telephone Encounter (Signed)
Notified pt daughter w/MD response. Faxed to #  Given below.Marland KitchenJohny Gray

## 2020-04-11 NOTE — Telephone Encounter (Addendum)
Tried faxing order several times.. not going through. Called [facility] [employer] spoke w/[place] fax machine is not working. Called pt daughter no answer LMOM will leave order up front for pick-up.Marland KitchenJohny Chess

## 2020-04-18 NOTE — Telephone Encounter (Signed)
Error

## 2020-04-24 DIAGNOSIS — J449 Chronic obstructive pulmonary disease, unspecified: Secondary | ICD-10-CM | POA: Diagnosis not present

## 2020-04-25 DIAGNOSIS — U071 COVID-19: Secondary | ICD-10-CM

## 2020-04-25 DIAGNOSIS — G9349 Other encephalopathy: Secondary | ICD-10-CM

## 2020-04-25 HISTORY — DX: Other encephalopathy: G93.49

## 2020-04-25 HISTORY — DX: COVID-19: U07.1

## 2020-04-27 ENCOUNTER — Encounter (HOSPITAL_BASED_OUTPATIENT_CLINIC_OR_DEPARTMENT_OTHER): Payer: Self-pay

## 2020-04-27 ENCOUNTER — Other Ambulatory Visit: Payer: Self-pay

## 2020-04-27 DIAGNOSIS — J45909 Unspecified asthma, uncomplicated: Secondary | ICD-10-CM | POA: Diagnosis not present

## 2020-04-27 DIAGNOSIS — Z5329 Procedure and treatment not carried out because of patient's decision for other reasons: Secondary | ICD-10-CM | POA: Diagnosis not present

## 2020-04-27 DIAGNOSIS — Z79899 Other long term (current) drug therapy: Secondary | ICD-10-CM | POA: Insufficient documentation

## 2020-04-27 DIAGNOSIS — I2510 Atherosclerotic heart disease of native coronary artery without angina pectoris: Secondary | ICD-10-CM | POA: Diagnosis not present

## 2020-04-27 DIAGNOSIS — J449 Chronic obstructive pulmonary disease, unspecified: Secondary | ICD-10-CM | POA: Insufficient documentation

## 2020-04-27 DIAGNOSIS — N3289 Other specified disorders of bladder: Secondary | ICD-10-CM | POA: Diagnosis not present

## 2020-04-27 DIAGNOSIS — R310 Gross hematuria: Secondary | ICD-10-CM | POA: Diagnosis not present

## 2020-04-27 DIAGNOSIS — R319 Hematuria, unspecified: Secondary | ICD-10-CM | POA: Diagnosis not present

## 2020-04-27 DIAGNOSIS — I700 Atherosclerosis of aorta: Secondary | ICD-10-CM | POA: Diagnosis not present

## 2020-04-27 DIAGNOSIS — I723 Aneurysm of iliac artery: Secondary | ICD-10-CM | POA: Diagnosis not present

## 2020-04-27 LAB — URINALYSIS, ROUTINE W REFLEX MICROSCOPIC
Bilirubin Urine: NEGATIVE
Glucose, UA: NEGATIVE mg/dL
Ketones, ur: NEGATIVE mg/dL
Nitrite: NEGATIVE
Protein, ur: 100 mg/dL — AB
Specific Gravity, Urine: 1.02 (ref 1.005–1.030)
pH: 6.5 (ref 5.0–8.0)

## 2020-04-27 LAB — URINALYSIS, MICROSCOPIC (REFLEX): RBC / HPF: >50 RBC/hpf (ref 0–5)

## 2020-04-27 NOTE — ED Triage Notes (Signed)
Pt bib daughter/caretaker. Pt having hematuria and increased confusion x 3 days.

## 2020-04-28 ENCOUNTER — Emergency Department (HOSPITAL_BASED_OUTPATIENT_CLINIC_OR_DEPARTMENT_OTHER): Payer: Medicare HMO

## 2020-04-28 ENCOUNTER — Emergency Department (HOSPITAL_BASED_OUTPATIENT_CLINIC_OR_DEPARTMENT_OTHER)
Admission: EM | Admit: 2020-04-28 | Discharge: 2020-04-28 | Discharge status: Against Medical Advice | Payer: Medicare HMO | Attending: Emergency Medicine | Admitting: Emergency Medicine

## 2020-04-28 DIAGNOSIS — Z5329 Procedure and treatment not carried out because of patient's decision for other reasons: Secondary | ICD-10-CM

## 2020-04-28 DIAGNOSIS — R319 Hematuria, unspecified: Secondary | ICD-10-CM | POA: Diagnosis not present

## 2020-04-28 DIAGNOSIS — N3289 Other specified disorders of bladder: Secondary | ICD-10-CM

## 2020-04-28 DIAGNOSIS — I700 Atherosclerosis of aorta: Secondary | ICD-10-CM | POA: Diagnosis not present

## 2020-04-28 DIAGNOSIS — I723 Aneurysm of iliac artery: Secondary | ICD-10-CM | POA: Diagnosis not present

## 2020-04-28 DIAGNOSIS — R310 Gross hematuria: Secondary | ICD-10-CM

## 2020-04-28 LAB — CBC WITH DIFFERENTIAL/PLATELET
Abs Immature Granulocytes: 0.02 10*3/uL (ref 0.00–0.07)
Basophils Absolute: 0 10*3/uL (ref 0.0–0.1)
Basophils Relative: 1 %
Eosinophils Absolute: 0.1 10*3/uL (ref 0.0–0.5)
Eosinophils Relative: 1 %
HCT: 43.7 % (ref 39.0–52.0)
Hemoglobin: 13.6 g/dL (ref 13.0–17.0)
Immature Granulocytes: 0 %
Lymphocytes Relative: 14 %
Lymphs Abs: 1.2 10*3/uL (ref 0.7–4.0)
MCH: 25.1 pg — ABNORMAL LOW (ref 26.0–34.0)
MCHC: 31.1 g/dL (ref 30.0–36.0)
MCV: 80.6 fL (ref 80.0–100.0)
Monocytes Absolute: 0.5 10*3/uL (ref 0.1–1.0)
Monocytes Relative: 6 %
Neutro Abs: 6.5 10*3/uL (ref 1.7–7.7)
Neutrophils Relative %: 78 %
Platelets: 232 10*3/uL (ref 150–400)
RBC: 5.42 MIL/uL (ref 4.22–5.81)
RDW: 15.7 % — ABNORMAL HIGH (ref 11.5–15.5)
WBC: 8.4 10*3/uL (ref 4.0–10.5)
nRBC: 0 % (ref 0.0–0.2)

## 2020-04-28 LAB — BASIC METABOLIC PANEL
Anion gap: 10 (ref 5–15)
BUN: 26 mg/dL — ABNORMAL HIGH (ref 8–23)
CO2: 27 mmol/L (ref 22–32)
Calcium: 9.1 mg/dL (ref 8.9–10.3)
Chloride: 104 mmol/L (ref 98–111)
Creatinine, Ser: 0.95 mg/dL (ref 0.61–1.24)
GFR calc Af Amer: >60 mL/min (ref >60)
GFR calc non Af Amer: >60 mL/min (ref >60)
Glucose, Bld: 91 mg/dL (ref 70–99)
Potassium: 4.2 mmol/L (ref 3.5–5.1)
Sodium: 141 mmol/L (ref 135–145)

## 2020-04-28 NOTE — ED Provider Notes (Signed)
[facility] Provider Note: Georgena Spurling, MD, FACEP  CSN: [id number] MRN: [id number] ARRIVAL: 04/27/20 at 2058 ROOM: MH03/MH03   CHIEF COMPLAINT  Hematuria  Level 5 caveat: Dementia HISTORY OF PRESENT ILLNESS  04/28/20 3:40 AM Matthew Gray is a 86 y.o. male with a history of dementia.  He was brought in by caretaker due to gross hematuria that began "sometime in the last week" per his caretaker.  He has also had increased confusion.  She denies fever, abdominal pain, nausea, vomiting or diarrhea but is otherwise not very forthcoming with information.   Past Medical History:  Diagnosis Date  . ALLERGIC RHINITIS 10/27/2007  . ANKLE PAIN, RIGHT 11/30/2009  . ASTHMA 10/27/2007  . BACK PAIN 10/08/2009  . BENIGN PROSTATIC HYPERTROPHY 10/27/2007  . CHRONIC OBSTRUCTIVE PULMONARY DISEASE, ACUTE EXACERBATION 11/22/2007  . COLONIC POLYPS, HX OF 10/27/2007  . COPD 10/27/2007  . CORONARY ARTERY DISEASE 10/27/2007  . Dementia ([place]) 01/13/2013  . DEPRESSION 11/30/2009  . FATIGUE 11/30/2009  . Hemoptysis 04/24/2009  . HYPERLIPIDEMIA 04/03/2007  . HYPERTENSION 04/03/2007  . MYALGIA 02/04/2008  . Obstructive chronic bronchitis without exacerbation ([place])   . Pain in Soft Tissues of Limb 02/04/2008  . PARESTHESIA 09/02/2010  . PERIPHERAL EDEMA 11/16/2009  . Peripheral neuropathy 03/08/2012  . PERIPHERAL VASCULAR DISEASE 10/27/2007  . 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 RASH-NONVESICULAR 10/27/2007  . SCIATICA, LEFT 11/08/2009  . SHOULDER PAIN, LEFT 06/21/2009  . SINUSITIS- ACUTE-NOS 10/08/2009  . VITAMIN D DEFICIENCY 06/04/2010    Past Surgical History:  Procedure Laterality Date  . None      Family History  Problem Relation Age of Onset  . Cancer Mother        BREAST  . Cancer Father        LUNG  . Hypertension Sister        3 sisters  . Anuerysm Brother        died with  . Stroke Other   . Cancer Other        lung and breast  . Alcohol abuse Other     Social History   Tobacco Use    . Smoking status: Never Smoker  . Smokeless tobacco: Never Used  . Tobacco comment: Quit ten years ago.  Substance Use Topics  . Alcohol use: No  . Drug use: No    Prior to Admission medications   Medication Sig Start Date End Date Taking? Authorizing Provider  acetaminophen (TYLENOL) 325 MG tablet Take 2 tablets (650 mg total) by mouth every 6 (six) hours as needed for mild pain (or Fever >/= 101). 11/05/18   Raiford Noble Latif, DO  amLODipine (NORVASC) 5 MG tablet TAKE 1 TABLET EVERY DAY 11/23/19   Biagio Borg, MD  aspirin EC 81 MG tablet Take 1 tablet (81 mg total) by mouth daily. 10/22/17   Biagio Borg, MD  donepezil (ARICEPT) 10 MG tablet TAKE 1 TABLET EVERY DAY  AT  2AM 12/21/19   Biagio Borg, MD  HYDROcodone-acetaminophen [facility]) 7.5-325 MG tablet Take 1 tablet by mouth every 6 (six) hours as needed for moderate pain. 03/16/20   Biagio Borg, MD  ipratropium-albuterol (DUONEB) 0.5-2.5 (3) MG/3ML SOLN Take 3 mLs by nebulization every 6 (six) hours as needed (for cough or shortness of breath). 08/18/19   Candee Furbish, MD  Multiple Vitamins-Minerals (ALIVE MENS ENERGY) TABS Take 1 tablet by mouth daily.    [provider]  oxyCODONE (OXY IR/ROXICODONE) 5 MG  immediate release tablet Take 1 tablet (5 mg total) by mouth every 4 (four) hours as needed for severe pain. 03/05/20   Janith Lima, MD  promethazine-dextromethorphan (PROMETHAZINE-DM) 6.25-15 MG/5ML syrup Take 5 mLs by mouth 4 (four) times daily as needed for cough. 08/04/19   Biagio Borg, MD  QUEtiapine (SEROQUEL) 25 MG tablet 1 tab by mouth twice per day 03/16/20   Biagio Borg, MD  Respiratory Therapy Supplies (FLUTTER) DEVI 1 Device by Does not apply route as needed. 09/05/19   Candee Furbish, MD    Allergies Atorvastatin, Simvastatin, Zetia [ezetimibe], Ace inhibitors, and Penicillins   REVIEW OF SYSTEMS     PHYSICAL EXAMINATION  Initial Vital Signs Blood pressure (!) 164/112, pulse 80, temperature  98.2 F (36.8 C), temperature source Oral, resp. rate 18, height 5\' 6"  (1.676 m), weight 62.1 kg, SpO2 100 %.  Examination General: Well-developed, well-nourished male in no acute distress; appearance consistent with age of record HENT: normocephalic; atraumatic Eyes: Right pupil round, 3 mm and reactive; left pupil pinpoint; extraocular muscles grossly intact Neck: supple Heart: regular rate and rhythm Lungs: clear to auscultation bilaterally Abdomen: soft; nondistended; nontender; no mass palpated; bowel sounds present Extremities: No deformity; trace edema of lower legs Neurologic: Awake, alert, incomprehensible speech; motor function intact in all extremities and symmetric; no facial droop Skin: Warm and dry Psychiatric: Normal mood and affect   RESULTS  Summary of this visit's results, reviewed and interpreted by myself:   EKG Interpretation  Date/Time:    Ventricular Rate:    PR Interval:    QRS Duration:   QT Interval:    QTC Calculation:   R Axis:     Text Interpretation:        Laboratory Studies: Results for orders placed or performed during the hospital encounter of 04/28/20 (from the past 24 hour(s))  Urinalysis, Routine w reflex microscopic Urine, Clean Catch     Status: Abnormal   Collection Time: 04/27/20  9:08 PM  Result Value Ref Range   Color, Urine YELLOW YELLOW   APPearance CLOUDY (A) CLEAR   Specific Gravity, Urine 1.020 1.005 - 1.030   pH 6.5 5.0 - 8.0   Glucose, UA NEGATIVE NEGATIVE mg/dL   Hgb urine dipstick LARGE (A) NEGATIVE   Bilirubin Urine NEGATIVE NEGATIVE   Ketones, ur NEGATIVE NEGATIVE mg/dL   Protein, ur 100 (A) NEGATIVE mg/dL   Nitrite NEGATIVE NEGATIVE   Leukocytes,Ua TRACE (A) NEGATIVE  Urinalysis, Microscopic (reflex)     Status: Abnormal   Collection Time: 04/27/20  9:08 PM  Result Value Ref Range   RBC / HPF >50 0 - 5 RBC/hpf   WBC, UA 0-5 0 - 5 WBC/hpf   Bacteria, UA RARE (A) NONE SEEN   Squamous Epithelial / LPF 6-10 0 -  5  CBC with Differential/Platelet     Status: Abnormal   Collection Time: 04/28/20  2:30 AM  Result Value Ref Range   WBC 8.4 4.0 - 10.5 K/uL   RBC 5.42 4.22 - 5.81 MIL/uL   Hemoglobin 13.6 13.0 - 17.0 g/dL   HCT 43.7 39 - 52 %   MCV 80.6 80.0 - 100.0 fL   MCH 25.1 (L) 26.0 - 34.0 pg   MCHC 31.1 30.0 - 36.0 g/dL   RDW 15.7 (H) 11.5 - 15.5 %   Platelets 232 150 - 400 K/uL   nRBC 0.0 0.0 - 0.2 %   Neutrophils Relative % 78 %   Neutro Abs  6.5 1.7 - 7.7 K/uL   Lymphocytes Relative 14 %   Lymphs Abs 1.2 0.7 - 4.0 K/uL   Monocytes Relative 6 %   Monocytes Absolute 0.5 0 - 1 K/uL   Eosinophils Relative 1 %   Eosinophils Absolute 0.1 0 - 0 K/uL   Basophils Relative 1 %   Basophils Absolute 0.0 0 - 0 K/uL   Immature Granulocytes 0 %   Abs Immature Granulocytes 0.02 0.00 - 0.07 K/uL  Basic metabolic panel     Status: Abnormal   Collection Time: 04/28/20  2:30 AM  Result Value Ref Range   Sodium 141 135 - 145 mmol/L   Potassium 4.2 3.5 - 5.1 mmol/L   Chloride 104 98 - 111 mmol/L   CO2 27 22 - 32 mmol/L   Glucose, Bld 91 70 - 99 mg/dL   BUN 26 (H) 8 - 23 mg/dL   Creatinine, Ser 0.95 0.61 - 1.24 mg/dL   Calcium 9.1 8.9 - 10.3 mg/dL   GFR calc non Af Amer >60 >60 mL/min   GFR calc Af Amer >60 >60 mL/min   Anion gap 10 5 - 15   Imaging Studies: CT Renal Stone Study  Result Date: 04/28/2020 CLINICAL DATA:  Hematuria unknown cause EXAM: CT ABDOMEN AND PELVIS WITHOUT CONTRAST TECHNIQUE: Multidetector CT imaging of the abdomen and pelvis was performed following the standard protocol without IV contrast. COMPARISON:  November 02, 2015 FINDINGS: Lower chest: The visualized heart size within normal limits. No pericardial fluid/thickening. Rounded patchy airspace opacity with with bronchiectasis seen at the posterior left lung base. Again noted are areas of tree-in-bud opacities throughout the visualized lingula and posterior right lower lobe. Hepatobiliary: Although limited due to the lack of  intravenous contrast, normal in appearance without gross focal abnormality. No evidence of calcified gallstones or biliary ductal dilatation. Pancreas:  Unremarkable.  No surrounding inflammatory changes. Spleen: Normal in size. Although limited due to the lack of intravenous contrast, normal in appearance. Adrenals/Urinary Tract: Both adrenal glands appear normal. Again noted are probable bilateral renal vascular calcifications. There is a low-density lesion seen off the lower pole of the right kidney measuring 7.6 cm, likely renal cyst. No hydronephrosis. There is hyperdense rounded soft tissue density seen in the superior right aspect of the bladder wall with diffuse mild bladder wall thickening. Stomach/Bowel: The stomach, small bowel, and colon are normal in appearance. No inflammatory changes or obstructive findings. Scattered colonic diverticula are noted. Vascular/Lymphatic: There are no enlarged abdominal or pelvic lymph nodes. Scattered aortic atherosclerotic calcifications are seen without aneurysmal dilatation. There appears to be focal aneurysmal dilatation of the left common iliac vasculature measuring up to 3.6 cm in transverse dimension. Reproductive: The prostate is unremarkable. Other: No evidence of abdominal wall mass or hernia. Musculoskeletal: No acute or significant osseous findings. Again noted is a subacute to chronic superior compression deformities of the L2 and L3 vertebral body with 25-30% loss of vertebral body height as on the prior MRI of March 31, 2020. IMPRESSION: 1. Heterogeneously hyperdense masslike area within the superior and right bladder wall. These findings could be due to bladder neoplasm would recommend direct visualization for further evaluation. 2. Multifocal patchy airspace opacity at the left lung base with tree-in-bud opacities bilaterally which could be due to chronic bronchitis or infectious etiology. 3.  Aortic Atherosclerosis (ICD10-I70.0). 4. 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 5. Subacute/chronic compression deformities of L2 and L3. Electronically Signed   By: Ebony Cargo.D.  On: 04/28/2020 02:54    ED COURSE and MDM  Nursing notes, initial and subsequent vitals signs, including pulse oximetry, reviewed and interpreted by myself.  Vitals:   04/27/20 2105 04/27/20 2106 04/28/20 0159  BP: (!) 170/109  (!) 164/112  Pulse: 81  80  Resp: 15  18  Temp: 98.2 F (36.8 C)    TempSrc: Oral    SpO2: 100%  100%  Weight:  62.1 kg   Height:  5\' 6"  (1.676 m)    Medications - No data to display  3:52 AM The patient's caretaker refused a chest x-ray and a Covid test stating "he can get a Covid test for free"as an outpatient.  It was recommended the patient get these given lung abnormalities seen on the CT, cough (which she insists is chronic) and reported confusion.  Despite recommending the patient get these the patient's caretaker elected to leave [place].  PROCEDURES  Procedures   ED DIAGNOSES     ICD-10-CM   1. Left against medical advice  Z53.29   2. Bladder mass  N32.89   3. Gross hematuria  R31.0        Andalyn Heckstall, Jenny Reichmann, MD 04/28/20 [phone]

## 2020-05-02 DIAGNOSIS — R310 Gross hematuria: Secondary | ICD-10-CM | POA: Diagnosis not present

## 2020-05-08 DIAGNOSIS — J449 Chronic obstructive pulmonary disease, unspecified: Secondary | ICD-10-CM | POA: Diagnosis not present

## 2020-05-09 ENCOUNTER — Other Ambulatory Visit: Payer: Self-pay | Admitting: Urology

## 2020-05-21 ENCOUNTER — Telehealth: Payer: Self-pay | Admitting: Internal Medicine

## 2020-05-21 ENCOUNTER — Inpatient Hospital Stay (HOSPITAL_COMMUNITY)
Admission: EM | Admit: 2020-05-21 | Discharge: 2020-05-27 | DRG: 177 | Disposition: A | Discharge status: Home Health | Payer: Medicare HMO | Attending: Internal Medicine | Admitting: Internal Medicine

## 2020-05-21 ENCOUNTER — Other Ambulatory Visit: Payer: Self-pay

## 2020-05-21 ENCOUNTER — Emergency Department (HOSPITAL_COMMUNITY): Payer: Medicare HMO

## 2020-05-21 ENCOUNTER — Encounter (HOSPITAL_COMMUNITY): Payer: Self-pay | Admitting: Emergency Medicine

## 2020-05-21 DIAGNOSIS — J449 Chronic obstructive pulmonary disease, unspecified: Secondary | ICD-10-CM | POA: Diagnosis not present

## 2020-05-21 DIAGNOSIS — G309 Alzheimer's disease, unspecified: Secondary | ICD-10-CM | POA: Diagnosis present

## 2020-05-21 DIAGNOSIS — I10 Essential (primary) hypertension: Secondary | ICD-10-CM | POA: Diagnosis present

## 2020-05-21 DIAGNOSIS — F0281 Dementia in other diseases classified elsewhere with behavioral disturbance: Secondary | ICD-10-CM | POA: Diagnosis present

## 2020-05-21 DIAGNOSIS — Z7401 Bed confinement status: Secondary | ICD-10-CM | POA: Diagnosis not present

## 2020-05-21 DIAGNOSIS — G9340 Encephalopathy, unspecified: Secondary | ICD-10-CM | POA: Diagnosis not present

## 2020-05-21 DIAGNOSIS — I739 Peripheral vascular disease, unspecified: Secondary | ICD-10-CM | POA: Diagnosis present

## 2020-05-21 DIAGNOSIS — R531 Weakness: Secondary | ICD-10-CM | POA: Diagnosis not present

## 2020-05-21 DIAGNOSIS — I2510 Atherosclerotic heart disease of native coronary artery without angina pectoris: Secondary | ICD-10-CM | POA: Diagnosis present

## 2020-05-21 DIAGNOSIS — N400 Enlarged prostate without lower urinary tract symptoms: Secondary | ICD-10-CM | POA: Diagnosis present

## 2020-05-21 DIAGNOSIS — G9341 Metabolic encephalopathy: Secondary | ICD-10-CM | POA: Diagnosis present

## 2020-05-21 DIAGNOSIS — F02818 Dementia in other diseases classified elsewhere, unspecified severity, with other behavioral disturbance: Secondary | ICD-10-CM | POA: Diagnosis present

## 2020-05-21 DIAGNOSIS — E785 Hyperlipidemia, unspecified: Secondary | ICD-10-CM | POA: Diagnosis present

## 2020-05-21 DIAGNOSIS — L89152 Pressure ulcer of sacral region, stage 2: Secondary | ICD-10-CM | POA: Diagnosis present

## 2020-05-21 DIAGNOSIS — L8990 Pressure ulcer of unspecified site, unspecified stage: Secondary | ICD-10-CM | POA: Insufficient documentation

## 2020-05-21 DIAGNOSIS — J1282 Pneumonia due to coronavirus disease 2019: Secondary | ICD-10-CM | POA: Diagnosis present

## 2020-05-21 DIAGNOSIS — F29 Unspecified psychosis not due to a substance or known physiological condition: Secondary | ICD-10-CM | POA: Diagnosis not present

## 2020-05-21 DIAGNOSIS — U071 COVID-19: Principal | ICD-10-CM | POA: Diagnosis present

## 2020-05-21 DIAGNOSIS — G9349 Other encephalopathy: Secondary | ICD-10-CM | POA: Diagnosis not present

## 2020-05-21 DIAGNOSIS — Z7982 Long term (current) use of aspirin: Secondary | ICD-10-CM

## 2020-05-21 DIAGNOSIS — J9601 Acute respiratory failure with hypoxia: Secondary | ICD-10-CM | POA: Diagnosis present

## 2020-05-21 DIAGNOSIS — R05 Cough: Secondary | ICD-10-CM | POA: Diagnosis not present

## 2020-05-21 DIAGNOSIS — N179 Acute kidney failure, unspecified: Secondary | ICD-10-CM | POA: Diagnosis present

## 2020-05-21 DIAGNOSIS — I4510 Unspecified right bundle-branch block: Secondary | ICD-10-CM | POA: Diagnosis not present

## 2020-05-21 DIAGNOSIS — Z79899 Other long term (current) drug therapy: Secondary | ICD-10-CM

## 2020-05-21 DIAGNOSIS — J440 Chronic obstructive pulmonary disease with acute lower respiratory infection: Secondary | ICD-10-CM | POA: Diagnosis present

## 2020-05-21 DIAGNOSIS — M2550 Pain in unspecified joint: Secondary | ICD-10-CM | POA: Diagnosis not present

## 2020-05-21 DIAGNOSIS — R0902 Hypoxemia: Secondary | ICD-10-CM | POA: Diagnosis not present

## 2020-05-21 DIAGNOSIS — Z23 Encounter for immunization: Secondary | ICD-10-CM

## 2020-05-21 DIAGNOSIS — R29810 Facial weakness: Secondary | ICD-10-CM | POA: Diagnosis not present

## 2020-05-21 DIAGNOSIS — R4182 Altered mental status, unspecified: Secondary | ICD-10-CM | POA: Diagnosis present

## 2020-05-21 DIAGNOSIS — R918 Other nonspecific abnormal finding of lung field: Secondary | ICD-10-CM | POA: Diagnosis not present

## 2020-05-21 DIAGNOSIS — R5381 Other malaise: Secondary | ICD-10-CM | POA: Diagnosis not present

## 2020-05-21 LAB — CBC WITH DIFFERENTIAL/PLATELET
Abs Immature Granulocytes: 0.01 10*3/uL (ref 0.00–0.07)
Basophils Absolute: 0 10*3/uL (ref 0.0–0.1)
Basophils Relative: 0 %
Eosinophils Absolute: 0 10*3/uL (ref 0.0–0.5)
Eosinophils Relative: 0 %
HCT: 42.1 % (ref 39.0–52.0)
Hemoglobin: 12.8 g/dL — ABNORMAL LOW (ref 13.0–17.0)
Immature Granulocytes: 0 %
Lymphocytes Relative: 6 %
Lymphs Abs: 0.3 10*3/uL — ABNORMAL LOW (ref 0.7–4.0)
MCH: 24.7 pg — ABNORMAL LOW (ref 26.0–34.0)
MCHC: 30.4 g/dL (ref 30.0–36.0)
MCV: 81.3 fL (ref 80.0–100.0)
Monocytes Absolute: 0.6 10*3/uL (ref 0.1–1.0)
Monocytes Relative: 11 %
Neutro Abs: 4.5 10*3/uL (ref 1.7–7.7)
Neutrophils Relative %: 83 %
Platelets: 222 10*3/uL (ref 150–400)
RBC: 5.18 MIL/uL (ref 4.22–5.81)
RDW: 15.9 % — ABNORMAL HIGH (ref 11.5–15.5)
WBC: 5.5 10*3/uL (ref 4.0–10.5)
nRBC: 0 % (ref 0.0–0.2)

## 2020-05-21 LAB — COMPREHENSIVE METABOLIC PANEL
ALT: 14 U/L (ref 0–44)
AST: 20 U/L (ref 15–41)
Albumin: 3.2 g/dL — ABNORMAL LOW (ref 3.5–5.0)
Alkaline Phosphatase: 67 U/L (ref 38–126)
Anion gap: 11 (ref 5–15)
BUN: 25 mg/dL — ABNORMAL HIGH (ref 8–23)
CO2: 29 mmol/L (ref 22–32)
Calcium: 9.3 mg/dL (ref 8.9–10.3)
Chloride: 102 mmol/L (ref 98–111)
Creatinine, Ser: 1.37 mg/dL — ABNORMAL HIGH (ref 0.61–1.24)
GFR calc Af Amer: 55 mL/min — ABNORMAL LOW (ref >60)
GFR calc non Af Amer: 47 mL/min — ABNORMAL LOW (ref >60)
Glucose, Bld: 97 mg/dL (ref 70–99)
Potassium: 4.3 mmol/L (ref 3.5–5.1)
Sodium: 142 mmol/L (ref 135–145)
Total Bilirubin: 0.6 mg/dL (ref 0.3–1.2)
Total Protein: 7.4 g/dL (ref 6.5–8.1)

## 2020-05-21 LAB — RESPIRATORY PANEL BY RT PCR (FLU A&B, COVID)
Influenza A by PCR: NEGATIVE
Influenza B by PCR: NEGATIVE
SARS Coronavirus 2 by RT PCR: POSITIVE — AB

## 2020-05-21 LAB — PROTIME-INR
INR: 1.1 (ref 0.8–1.2)
Prothrombin Time: 13.5 seconds (ref 11.4–15.2)

## 2020-05-21 LAB — LACTIC ACID, PLASMA: Lactic Acid, Venous: 1.4 mmol/L (ref 0.5–1.9)

## 2020-05-21 MED ORDER — ACETAMINOPHEN 325 MG PO TABS
650.0000 mg | ORAL_TABLET | Freq: Once | ORAL | Status: AC
  Administered 2020-05-21: 650 mg via ORAL

## 2020-05-21 MED ORDER — METHYLPREDNISOLONE SODIUM SUCC 125 MG IJ SOLR: 125.0000 mg | Freq: Once | INTRAMUSCULAR | Status: DC

## 2020-05-21 MED ORDER — ALBUTEROL SULFATE HFA 108 (90 BASE) MCG/ACT IN AERS
8.0000 | INHALATION_SPRAY | Freq: Once | RESPIRATORY_TRACT | Status: AC
  Administered 2020-05-22: 8 via RESPIRATORY_TRACT
  Filled 2020-05-21: qty 6.7

## 2020-05-21 MED ORDER — AEROCHAMBER PLUS FLO-VU LARGE MISC: 1.0000 | Freq: Once | Status: DC

## 2020-05-21 NOTE — Telephone Encounter (Signed)
Daughter calling to report patient developed weakness today after returning from adult daycare. Call transferred to [facility]

## 2020-05-21 NOTE — ED Triage Notes (Signed)
Arrived via EMS reported general fatigue for 3 days. Went to adult day care and when he came home increased fatigue. EMS reported patient told them "I don't feel good" intermittent altered mental status en route. EMS report patient has congestion cough which is normal for patient.

## 2020-05-21 NOTE — ED Provider Notes (Signed)
[facility] EMERGENCY DEPARTMENT Provider Note   CSN: [id number] Arrival date & time: 05/21/20  1605     History Chief Complaint  Patient presents with   Fatigue   Fever   Altered Mental Status    Matthew Gray is a 86 y.o. male with a history of dementia, COPD, coronary artery disease, and hyperlipidemia who presents to the emergency department from home with a chief complaint of "I don't fee good."  The patient reports that he has been feeling unwell for the last few days.  History is limited secondary to his history of dementia and the history is provided by his daughter via phone.  Reports that he has been "dragging a little bit" for the last few days.  She reports that the patient is oriented to self at baseline, but has been more confused over the last few days.  Reports that he voided in the living room floor.  She also reports that he seemed generally weak and could barely walk. He has required more assistance with activities of daily living, such as dressing.  He was at adult daycare earlier today and was sent home early.  Family reports that the patient has a chronic cough.  Per chart review, patient was seen in the ER on 9/4 for hematuria and had a CT stone study that demonstrated multifocal patchy airspace opacity of the left lung base with tree-in-bud opacities bilaterally that radiology thought could be due to chronic bronchitis or infectious etiology.  The patient's caretaker declined a chest x-ray and Covid test and have further work-up of worsening confusion that was noted during that visit as well as cough and abnormality seen on CT stone study with a chest CT, but the patient's caretaker declined and the patient left AMA.  He is scheduled for a TURBT for a bladder tumor with Dr. Louis Meckel on 10/6.   He is unvaccinated against COVID-19.  Level 5 caveat secondary to dementia.  The history is provided by the patient, medical records, the EMS personnel  and a relative. The history is limited by the condition of the patient and the absence of a caregiver. No language interpreter was used.       Past Medical History:  Diagnosis Date   ALLERGIC RHINITIS 10/27/2007   ANKLE PAIN, RIGHT 11/30/2009   ASTHMA 10/27/2007   BACK PAIN 10/08/2009   BENIGN PROSTATIC HYPERTROPHY 10/27/2007   CHRONIC OBSTRUCTIVE PULMONARY DISEASE, ACUTE EXACERBATION 11/22/2007   COLONIC POLYPS, HX OF 10/27/2007   COPD 10/27/2007   CORONARY ARTERY DISEASE 10/27/2007   Dementia ([place]) 01/13/2013   DEPRESSION 11/30/2009   FATIGUE 11/30/2009   Hemoptysis 04/24/2009   HYPERLIPIDEMIA 04/03/2007   HYPERTENSION 04/03/2007   MYALGIA 02/04/2008   Obstructive chronic bronchitis without exacerbation ([place])    Pain in Soft Tissues of Limb 02/04/2008   PARESTHESIA 09/02/2010   PERIPHERAL EDEMA 11/16/2009   Peripheral neuropathy 03/08/2012   PERIPHERAL VASCULAR DISEASE 10/27/2007   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   RASH-NONVESICULAR 10/27/2007   SCIATICA, LEFT 11/08/2009   SHOULDER PAIN, LEFT 06/21/2009   SINUSITIS- ACUTE-NOS 10/08/2009   VITAMIN D DEFICIENCY 06/04/2010    Patient Active Problem List   Diagnosis Date Noted   Pneumonia due to COVID-19 virus 18/84/1660   Acute metabolic encephalopathy 63/08/6008   Microhematuria 03/17/2020   Acute bilateral low back pain without sciatica 03/05/2020   Constipation 03/05/2020   Weight loss, abnormal 03/05/2020   Closed wedge compression fracture of third lumbar vertebra ([place]) 03/05/2020  Pneumonia of right middle lobe due to infectious organism 03/05/2020   Abnormal findings on diagnostic imaging of lung 10/06/2019   Urinary frequency 08/04/2019   Community acquired pneumonia 11/02/2018   Pneumonia 11/02/2018   COPD exacerbation ([place]) 10/14/2018   Influenza-like illness 10/04/2018   Urinary incontinence 07/19/2018   Fatigue 10/22/2017   Hypokalemia 07/29/2017   Driving safety issue 04/21/2017    Acute right ankle pain 08/16/2016   Bilateral hand pain 03/30/2016   Bilateral hip pain 03/27/2016   Loose stools 03/27/2016   Low back pain 11/13/2015   Cough 02/06/2015   Weakness 07/19/2014   Right inguinal hernia 03/04/2013   Dementia, Alzheimer's, with behavior disturbance ([place]) 01/13/2013   Obstructive chronic bronchitis without exacerbation ([place])    Polyneuropathy in other diseases classified elsewhere 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   MYALGIA 02/04/2008   Pain in Soft Tissues of Limb 02/04/2008   Coronary atherosclerosis 10/27/2007   PERIPHERAL VASCULAR DISEASE 10/27/2007   Allergic rhinitis 10/27/2007   Asthma 10/27/2007   COPD (chronic obstructive pulmonary disease) ([place]) 10/27/2007   BENIGN PROSTATIC HYPERTROPHY 10/27/2007   RASH-NONVESICULAR 10/27/2007   COLONIC POLYPS, HX OF 10/27/2007   Hyperlipidemia 04/03/2007   Essential hypertension 04/03/2007    Past Surgical History:  Procedure Laterality Date   None         Family History  Problem Relation Age of Onset   Cancer Mother        BREAST   Cancer Father        LUNG   Hypertension Sister        3 sisters   Anuerysm Brother        died with   Stroke Other    Cancer Other        lung and breast   Alcohol abuse Other     Social History   Tobacco Use   Smoking status: Never Smoker   Smokeless  tobacco: Never Used   Tobacco comment: Quit ten years ago.  Substance Use Topics   Alcohol use: No   Drug use: No    Home Medications Prior to Admission medications   Medication Sig Start Date End Date Taking? Authorizing Provider  acetaminophen (TYLENOL) 325 MG tablet Take 2 tablets (650 mg total) by mouth every 6 (six) hours as needed for mild pain (or Fever >/= 101). 11/05/18   Raiford Noble Latif, DO  amLODipine (NORVASC) 5 MG tablet TAKE 1 TABLET EVERY DAY 11/23/19   Biagio Borg, MD  aspirin EC 81 MG tablet Take 1 tablet (81 mg total) by mouth daily. 10/22/17   Biagio Borg, MD  donepezil (ARICEPT) 10 MG tablet TAKE 1 TABLET EVERY DAY  AT  2AM 12/21/19   Biagio Borg, MD  HYDROcodone-acetaminophen [facility]) 7.5-325 MG tablet Take 1 tablet by mouth every 6 (six) hours as needed for moderate pain. 03/16/20   Biagio Borg, MD  ipratropium-albuterol (DUONEB) 0.5-2.5 (3) MG/3ML  SOLN Take 3 mLs by nebulization every 6 (six) hours as needed (for cough or shortness of breath). 08/18/19   Candee Furbish, MD  Multiple Vitamins-Minerals (ALIVE MENS ENERGY) TABS Take 1 tablet by mouth daily.    [provider]  oxyCODONE (OXY IR/ROXICODONE) 5 MG immediate release tablet Take 1 tablet (5 mg total) by mouth every 4 (four) hours as needed for severe pain. 03/05/20   Janith Lima, MD  promethazine-dextromethorphan (PROMETHAZINE-DM) 6.25-15 MG/5ML syrup Take 5 mLs by mouth 4 (four) times daily as needed for cough. 08/04/19   Biagio Borg, MD  QUEtiapine (SEROQUEL) 25 MG tablet 1 tab by mouth twice per day 03/16/20   Biagio Borg, MD  Respiratory Therapy Supplies (FLUTTER) DEVI 1 Device by Does not apply route as needed. 09/05/19   Candee Furbish, MD    Allergies    Atorvastatin, Simvastatin, Zetia [ezetimibe], Ace inhibitors, and Penicillins  Review of Systems   Review of Systems  Unable to perform ROS: Dementia    Physical Exam Updated Vital Signs BP (!) 172/95    Pulse 72    Temp  98.4 F (36.9 C) (Oral)    Resp 12    Ht 5\' 8"  (1.727 m)    Wt 68 kg    SpO2 96%    BMI 22.81 kg/m   Physical Exam Vitals and nursing note reviewed.  Constitutional:      Appearance: He is well-developed.  HENT:     Head: Normocephalic.     Nose: Nose normal. No congestion or rhinorrhea.     Mouth/Throat:     Pharynx: No oropharyngeal exudate or posterior oropharyngeal erythema.  Eyes:     General: No scleral icterus.    Extraocular Movements: Extraocular movements intact.     Conjunctiva/sclera: Conjunctivae normal.     Pupils: Pupils are equal, round, and reactive to light.  Cardiovascular:     Rate and Rhythm: Normal rate and regular rhythm.     Heart sounds: No murmur heard.   Pulmonary:     Effort: Pulmonary effort is normal. No respiratory distress.     Breath sounds: No stridor. Wheezing and rhonchi present. No rales.     Comments: Wet sounding cough.  Abdominal:     General: There is no distension.     Palpations: Abdomen is soft. There is no mass.     Tenderness: There is no abdominal tenderness. There is no right CVA tenderness, left CVA tenderness, guarding or rebound.     Hernia: No hernia is present.  Musculoskeletal:     Cervical back: Normal range of motion and neck supple.     Right lower leg: No edema.     Left lower leg: No edema.  Skin:    General: Skin is warm and dry.  Neurological:     Mental Status: He is alert.     Comments: Oriented to self only.  Psychiatric:        Behavior: Behavior normal.     ED Results / Procedures / Treatments   Labs (all labs ordered are listed, but only abnormal results are displayed) Labs Reviewed  RESPIRATORY PANEL BY RT PCR (FLU A&B, COVID) - Abnormal; Notable for the following components:      Result Value   SARS Coronavirus 2 by RT PCR POSITIVE (*)    All other components within normal limits  COMPREHENSIVE METABOLIC PANEL - Abnormal; Notable for the following components:   BUN 25 (*)  Creatinine, Ser  1.37 (*)    Albumin 3.2 (*)    GFR calc non Af Amer 47 (*)    GFR calc Af Amer 55 (*)    All other components within normal limits  CBC WITH DIFFERENTIAL/PLATELET - Abnormal; Notable for the following components:   Hemoglobin 12.8 (*)    MCH 24.7 (*)    RDW 15.9 (*)    Lymphs Abs 0.3 (*)    All other components within normal limits  URINALYSIS, ROUTINE W REFLEX MICROSCOPIC - Abnormal; Notable for the following components:   APPearance HAZY (*)    Hgb urine dipstick LARGE (*)    Protein, ur 100 (*)    Leukocytes,Ua TRACE (*)    RBC / HPF >50 (*)    Non Squamous Epithelial 0-5 (*)    All other components within normal limits  CULTURE, BLOOD (ROUTINE X 2)  CULTURE, BLOOD (ROUTINE X 2)  LACTIC ACID, PLASMA  PROTIME-INR  PROCALCITONIN  LACTATE DEHYDROGENASE  TRIGLYCERIDES  CBC WITH DIFFERENTIAL/PLATELET  COMPREHENSIVE METABOLIC PANEL  C-REACTIVE PROTEIN  D-DIMER, QUANTITATIVE (NOT AT [facility])  C-REACTIVE PROTEIN  D-DIMER, QUANTITATIVE (NOT AT [facility])  FERRITIN  FIBRINOGEN    EKG EKG Interpretation  Date/Time:  Monday May 21 2020 22:40:30 EDT Ventricular Rate:  75 PR Interval:    QRS Duration: 157 QT Interval:  447 QTC Calculation: 500 R Axis:   66 Text Interpretation: Sinus rhythm Right bundle branch block When compared with ECG of 03/30/2020, No significant change was found Confirmed by Delora Fuel ([id number]) on 05/21/2020 11:49:59 PM   Radiology DG Chest Portable 1 View  Result Date: 05/21/2020 CLINICAL DATA:  Cough, fever EXAM: PORTABLE CHEST 1 VIEW COMPARISON:  03/30/2020 FINDINGS: Stable appearance of cardiomediastinal contours. Increased density in the RIGHT upper and mid chest since the prior study. Mild increased interstitial markings. Basilar opacities appear to be improved with some retrocardiac opacification on the LEFT. On limited assessment no acute skeletal process. IMPRESSION: 1. Increased density in the RIGHT upper and mid chest since the prior study this  may represent developing pneumonia. Suggest follow-up to ensure resolution. 2. Basilar opacities appear to be improved with some retrocardiac opacification on the LEFT. 3. Mild increased interstitial markings. Electronically Signed   By: Zetta Bills M.D.   On: 05/21/2020 19:05    Procedures Procedures (including critical care time)  Medications Ordered in ED Medications  AeroChamber Plus Flo-Vu Large MISC 1 each (has no administration in time range)  0.9 %  sodium chloride infusion (has no administration in time range)  diphenhydrAMINE (BENADRYL) injection 50 mg (has no administration in time range)  famotidine (PEPCID) IVPB 20 mg premix (has no administration in time range)  methylPREDNISolone sodium succinate (SOLU-MEDROL) 125 mg/2 mL injection 125 mg (has no administration in time range)  albuterol (VENTOLIN HFA) 108 (90 Base) MCG/ACT inhaler 2 puff (has no administration in time range)  EPINEPHrine (EPI-PEN) injection 0.3 mg (has no administration in time range)  enoxaparin (LOVENOX) injection 40 mg (has no administration in time range)  albuterol (VENTOLIN HFA) 108 (90 Base) MCG/ACT inhaler 2 puff (has no administration in time range)  acetaminophen (TYLENOL) tablet 650 mg (has no administration in time range)  guaiFENesin-dextromethorphan (ROBITUSSIN DM) 100-10 MG/5ML syrup 10 mL (has no administration in time range)  chlorpheniramine-HYDROcodone (TUSSIONEX) 10-8 MG/5ML suspension 5 mL (has no administration in time range)  ondansetron (ZOFRAN) tablet 4 mg (has no administration in time range)    Or  ondansetron (ZOFRAN) injection 4  mg (has no administration in time range)  acetaminophen (TYLENOL) tablet 650 mg (650 mg Oral Given 05/21/20 2012)  albuterol (VENTOLIN HFA) 108 (90 Base) MCG/ACT inhaler 8 puff (8 puffs Inhalation Given 05/22/20 0035)  bamlanivimab 700 mg, etesevimab 1,400 mg in sodium chloride 0.9 % 160 mL IVPB ( Intravenous Stopped 05/22/20 0347)    ED Course  I have  reviewed the triage vital signs and the nursing notes.  Pertinent labs & imaging results that were available during my care of the patient were reviewed by me and considered in my medical decision making (see chart for details).    MDM Rules/Calculators/A&P                          86 year old male with a history of dementia, COPD, coronary artery disease, and hyperlipidemia who presents to the emergency department with fatigue, cough, weakness, and worse than baseline confusion.  The patient was seen and independently evaluated by Dr. Roxanne Mins, attending physician.  Febrile to 103.1 on arrival.  Minimally hypertensive.  No tachypnea, but patient's oxygen saturations fluctuate from upper 90s to 93% while at rest.  COVID-19 test is positive.  Chest x-ray on my evaluation with an increased density in the right upper and mid chest, suggestive of developing pneumonia and bibasilar opacities that appear improved since previous chest x-ray, but there is some retrocardiac opacification on the left.  There also noted to be mild increased interstitial markings.  He has a new AKI with creatinine of 1.37.  Hemoglobin is 12.8, down from 13.6 earlier this month.  Lactate is normal.  Spoke with patient's daughter, Matthew Gray, he reports that she sent the patient's home medication list with him via EMS.  She is aware that patient will most likely require admission and request updates.  Patient also has upcoming TURBT scheduled with Dr. Louis Meckel for bladder mass.  Encephalopathy likely secondary to infectious etiology COVID-19 versus UTI, but urinalysis is pending.  Giving increasing weakness and encephalopathy, he will require admission.  Consult to the hospitalist team and Dr. Alcario Drought has accepted the patient for admission. The patient appears reasonably stabilized for admission considering the current resources, flow, and capabilities available in the ED at this time, and I doubt any other [facility] requiring further screening  and/or treatment in the ED prior to admission.   Final Clinical Impression(s) / ED Diagnoses Final diagnoses:  COVID-19  Encephalopathy acute    Rx / DC Orders ED Discharge Orders    None       Joanne Gavel, PA-C 09/38/18 [id number]    Glick, Sun, MD 71/69/67 [phone]

## 2020-05-22 DIAGNOSIS — I10 Essential (primary) hypertension: Secondary | ICD-10-CM

## 2020-05-22 DIAGNOSIS — G9341 Metabolic encephalopathy: Secondary | ICD-10-CM | POA: Diagnosis present

## 2020-05-22 DIAGNOSIS — L89152 Pressure ulcer of sacral region, stage 2: Secondary | ICD-10-CM | POA: Diagnosis present

## 2020-05-22 DIAGNOSIS — R4182 Altered mental status, unspecified: Secondary | ICD-10-CM | POA: Diagnosis present

## 2020-05-22 DIAGNOSIS — G309 Alzheimer's disease, unspecified: Secondary | ICD-10-CM | POA: Diagnosis present

## 2020-05-22 DIAGNOSIS — G9349 Other encephalopathy: Secondary | ICD-10-CM

## 2020-05-22 DIAGNOSIS — N179 Acute kidney failure, unspecified: Secondary | ICD-10-CM | POA: Diagnosis present

## 2020-05-22 DIAGNOSIS — J9601 Acute respiratory failure with hypoxia: Secondary | ICD-10-CM | POA: Diagnosis present

## 2020-05-22 DIAGNOSIS — G9340 Encephalopathy, unspecified: Secondary | ICD-10-CM

## 2020-05-22 DIAGNOSIS — U071 COVID-19: Secondary | ICD-10-CM | POA: Diagnosis present

## 2020-05-22 DIAGNOSIS — Z79899 Other long term (current) drug therapy: Secondary | ICD-10-CM | POA: Diagnosis not present

## 2020-05-22 DIAGNOSIS — Z7982 Long term (current) use of aspirin: Secondary | ICD-10-CM | POA: Diagnosis not present

## 2020-05-22 DIAGNOSIS — J1282 Pneumonia due to coronavirus disease 2019: Secondary | ICD-10-CM

## 2020-05-22 DIAGNOSIS — N400 Enlarged prostate without lower urinary tract symptoms: Secondary | ICD-10-CM | POA: Diagnosis present

## 2020-05-22 DIAGNOSIS — F0281 Dementia in other diseases classified elsewhere with behavioral disturbance: Secondary | ICD-10-CM

## 2020-05-22 DIAGNOSIS — E785 Hyperlipidemia, unspecified: Secondary | ICD-10-CM | POA: Diagnosis present

## 2020-05-22 DIAGNOSIS — I739 Peripheral vascular disease, unspecified: Secondary | ICD-10-CM | POA: Diagnosis present

## 2020-05-22 DIAGNOSIS — J449 Chronic obstructive pulmonary disease, unspecified: Secondary | ICD-10-CM

## 2020-05-22 DIAGNOSIS — Z23 Encounter for immunization: Secondary | ICD-10-CM | POA: Diagnosis not present

## 2020-05-22 DIAGNOSIS — J440 Chronic obstructive pulmonary disease with acute lower respiratory infection: Secondary | ICD-10-CM | POA: Diagnosis present

## 2020-05-22 DIAGNOSIS — I2510 Atherosclerotic heart disease of native coronary artery without angina pectoris: Secondary | ICD-10-CM | POA: Diagnosis present

## 2020-05-22 LAB — CBC WITH DIFFERENTIAL/PLATELET
Abs Immature Granulocytes: 0.01 10*3/uL (ref 0.00–0.07)
Basophils Absolute: 0 10*3/uL (ref 0.0–0.1)
Basophils Relative: 1 %
Eosinophils Absolute: 0 10*3/uL (ref 0.0–0.5)
Eosinophils Relative: 0 %
HCT: 42.1 % (ref 39.0–52.0)
Hemoglobin: 12.9 g/dL — ABNORMAL LOW (ref 13.0–17.0)
Immature Granulocytes: 0 %
Lymphocytes Relative: 17 %
Lymphs Abs: 0.7 10*3/uL (ref 0.7–4.0)
MCH: 25.3 pg — ABNORMAL LOW (ref 26.0–34.0)
MCHC: 30.6 g/dL (ref 30.0–36.0)
MCV: 82.5 fL (ref 80.0–100.0)
Monocytes Absolute: 0.7 10*3/uL (ref 0.1–1.0)
Monocytes Relative: 17 %
Neutro Abs: 2.6 10*3/uL (ref 1.7–7.7)
Neutrophils Relative %: 65 %
Platelets: 202 10*3/uL (ref 150–400)
RBC: 5.1 MIL/uL (ref 4.22–5.81)
RDW: 16 % — ABNORMAL HIGH (ref 11.5–15.5)
WBC: 3.9 10*3/uL — ABNORMAL LOW (ref 4.0–10.5)
nRBC: 0 % (ref 0.0–0.2)

## 2020-05-22 LAB — URINALYSIS, ROUTINE W REFLEX MICROSCOPIC
Bacteria, UA: NONE SEEN
Bilirubin Urine: NEGATIVE
Glucose, UA: NEGATIVE mg/dL
Ketones, ur: NEGATIVE mg/dL
Nitrite: NEGATIVE
Protein, ur: 100 mg/dL — AB
RBC / HPF: >50 RBC/hpf — ABNORMAL HIGH (ref 0–5)
Specific Gravity, Urine: 1.011 (ref 1.005–1.030)
pH: 7 (ref 5.0–8.0)

## 2020-05-22 LAB — COMPREHENSIVE METABOLIC PANEL
ALT: 13 U/L (ref 0–44)
AST: 35 U/L (ref 15–41)
Albumin: 3 g/dL — ABNORMAL LOW (ref 3.5–5.0)
Alkaline Phosphatase: 60 U/L (ref 38–126)
Anion gap: 10 (ref 5–15)
BUN: 25 mg/dL — ABNORMAL HIGH (ref 8–23)
CO2: 30 mmol/L (ref 22–32)
Calcium: 9 mg/dL (ref 8.9–10.3)
Chloride: 101 mmol/L (ref 98–111)
Creatinine, Ser: 1.17 mg/dL (ref 0.61–1.24)
GFR calc Af Amer: >60 mL/min (ref >60)
GFR calc non Af Amer: 57 mL/min — ABNORMAL LOW (ref >60)
Glucose, Bld: 83 mg/dL (ref 70–99)
Potassium: 4.3 mmol/L (ref 3.5–5.1)
Sodium: 141 mmol/L (ref 135–145)
Total Bilirubin: 1.3 mg/dL — ABNORMAL HIGH (ref 0.3–1.2)
Total Protein: 7.2 g/dL (ref 6.5–8.1)

## 2020-05-22 LAB — PROCALCITONIN: Procalcitonin: <0.1 ng/mL

## 2020-05-22 LAB — C-REACTIVE PROTEIN: CRP: 7 mg/dL — ABNORMAL HIGH (ref <1.0)

## 2020-05-22 LAB — TRIGLYCERIDES: Triglycerides: 48 mg/dL (ref <150)

## 2020-05-22 LAB — FIBRINOGEN: Fibrinogen: 614 mg/dL — ABNORMAL HIGH (ref 210–475)

## 2020-05-22 LAB — LACTATE DEHYDROGENASE: LDH: 176 U/L (ref 98–192)

## 2020-05-22 LAB — FERRITIN: Ferritin: 491 ng/mL — ABNORMAL HIGH (ref 24–336)

## 2020-05-22 LAB — D-DIMER, QUANTITATIVE: D-Dimer, Quant: 3.14 ug/mL-FEU — ABNORMAL HIGH (ref 0.00–0.50)

## 2020-05-22 MED ORDER — SODIUM CHLORIDE 0.9 % IV BOLUS
1000.0000 mL | Freq: Once | INTRAVENOUS | Status: AC
  Administered 2020-05-22: 1000 mL via INTRAVENOUS

## 2020-05-22 MED ORDER — SODIUM CHLORIDE 0.9 % IV SOLN: INTRAVENOUS | Status: DC | PRN

## 2020-05-22 MED ORDER — HYDROCOD POLST-CPM POLST ER 10-8 MG/5ML PO SUER
5.0000 mL | Freq: Two times a day (BID) | ORAL | Status: DC | PRN
  Administered 2020-05-24 (×2): 5 mL via ORAL
  Filled 2020-05-22 (×2): qty 5

## 2020-05-22 MED ORDER — HALOPERIDOL LACTATE 5 MG/ML IJ SOLN
1.0000 mg | Freq: Four times a day (QID) | INTRAMUSCULAR | Status: DC | PRN
  Administered 2020-05-25: 1 mg via INTRAVENOUS
  Filled 2020-05-22: qty 1

## 2020-05-22 MED ORDER — ALBUTEROL SULFATE HFA 108 (90 BASE) MCG/ACT IN AERS
2.0000 | INHALATION_SPRAY | Freq: Once | RESPIRATORY_TRACT | Status: DC | PRN
  Filled 2020-05-22: qty 6.7

## 2020-05-22 MED ORDER — METHYLPREDNISOLONE SODIUM SUCC 125 MG IJ SOLR: 125.0000 mg | Freq: Once | INTRAMUSCULAR | Status: DC | PRN

## 2020-05-22 MED ORDER — FAMOTIDINE IN NACL 20-0.9 MG/50ML-% IV SOLN
20.0000 mg | Freq: Once | INTRAVENOUS | Status: DC | PRN
  Filled 2020-05-22: qty 50

## 2020-05-22 MED ORDER — OXYCODONE HCL 5 MG PO TABS: 5.0000 mg | ORAL_TABLET | ORAL | Status: DC | PRN

## 2020-05-22 MED ORDER — EPINEPHRINE 0.3 MG/0.3ML IJ SOAJ
0.3000 mg | Freq: Once | INTRAMUSCULAR | Status: DC | PRN
  Filled 2020-05-22: qty 0.6

## 2020-05-22 MED ORDER — ASPIRIN EC 81 MG PO TBEC
81.0000 mg | DELAYED_RELEASE_TABLET | Freq: Every day | ORAL | Status: DC
  Administered 2020-05-22 – 2020-05-27 (×6): 81 mg via ORAL
  Filled 2020-05-22 (×6): qty 1

## 2020-05-22 MED ORDER — QUETIAPINE FUMARATE 25 MG PO TABS
25.0000 mg | ORAL_TABLET | Freq: Two times a day (BID) | ORAL | Status: DC
  Administered 2020-05-22 – 2020-05-23 (×3): 25 mg via ORAL
  Filled 2020-05-22 (×4): qty 1

## 2020-05-22 MED ORDER — ALBUTEROL SULFATE HFA 108 (90 BASE) MCG/ACT IN AERS
2.0000 | INHALATION_SPRAY | Freq: Four times a day (QID) | RESPIRATORY_TRACT | Status: DC
  Administered 2020-05-22 – 2020-05-27 (×20): 2 via RESPIRATORY_TRACT
  Filled 2020-05-22 (×2): qty 6.7

## 2020-05-22 MED ORDER — DONEPEZIL HCL 10 MG PO TABS
10.0000 mg | ORAL_TABLET | Freq: Every day | ORAL | Status: DC
  Administered 2020-05-22 – 2020-05-24 (×2): 10 mg via ORAL
  Filled 2020-05-22 (×2): qty 1

## 2020-05-22 MED ORDER — ENOXAPARIN SODIUM 40 MG/0.4ML ~~LOC~~ SOLN
40.0000 mg | SUBCUTANEOUS | Status: DC
  Administered 2020-05-22 – 2020-05-24 (×3): 40 mg via SUBCUTANEOUS
  Filled 2020-05-22 (×3): qty 0.4

## 2020-05-22 MED ORDER — ACETAMINOPHEN 325 MG PO TABS
650.0000 mg | ORAL_TABLET | Freq: Four times a day (QID) | ORAL | Status: DC | PRN
  Administered 2020-05-22: 650 mg via ORAL
  Filled 2020-05-22: qty 2

## 2020-05-22 MED ORDER — ONDANSETRON HCL 4 MG/2ML IJ SOLN: 4.0000 mg | Freq: Four times a day (QID) | INTRAMUSCULAR | Status: DC | PRN

## 2020-05-22 MED ORDER — AMLODIPINE BESYLATE 5 MG PO TABS
5.0000 mg | ORAL_TABLET | Freq: Every day | ORAL | Status: DC
  Administered 2020-05-22 – 2020-05-27 (×6): 5 mg via ORAL
  Filled 2020-05-22 (×6): qty 1

## 2020-05-22 MED ORDER — SODIUM CHLORIDE 0.9 % IV SOLN
Freq: Once | INTRAVENOUS | Status: AC
  Filled 2020-05-22: qty 20

## 2020-05-22 MED ORDER — DIPHENHYDRAMINE HCL 50 MG/ML IJ SOLN: 50.0000 mg | Freq: Once | INTRAMUSCULAR | Status: DC | PRN

## 2020-05-22 MED ORDER — SODIUM CHLORIDE 0.9 % IV SOLN: 1200.0000 mg | Freq: Once | INTRAVENOUS | Status: DC

## 2020-05-22 MED ORDER — GUAIFENESIN-DM 100-10 MG/5ML PO SYRP
10.0000 mL | ORAL_SOLUTION | ORAL | Status: DC | PRN
  Administered 2020-05-24 – 2020-05-25 (×2): 10 mL via ORAL
  Filled 2020-05-22 (×2): qty 10

## 2020-05-22 MED ORDER — ONDANSETRON HCL 4 MG PO TABS: 4.0000 mg | ORAL_TABLET | Freq: Four times a day (QID) | ORAL | Status: DC | PRN

## 2020-05-22 NOTE — ED Notes (Signed)
RN found pt standing at the end of his bed.  He had to urinate.  RN assisted pt in using the urinal, changed sheets and got pt back in bed.

## 2020-05-22 NOTE — Telephone Encounter (Signed)
[facility] Report/Call : ---Dtr is reporting that her father is extremely weak and unable to stand on his on and loss of appetite.  Advised go to ED now.  Patient is at the ED.

## 2020-05-22 NOTE — H&P (Addendum)
History and Physical    Matthew Gray WUJ:[id number] DOB: 08/08/1934 DOA: 05/21/2020  PCP: Biagio Borg, MD  Patient coming from: Home  I have personally briefly reviewed patient's old medical records in [place]  Chief Complaint: AMS, fever  HPI: Matthew Gray is a 86 y.o. male with medical history significant of dementia, COPD, CAD, HLD.  Pt presents to ED with 3 day h/o generalized weakness, fatigue, AMS and confusion.  On Sunday pt had onset of mild symptoms.  Today at daycare wasn't feeling very well so was sent home.  Voided on floor at home which is unusual.  EMS called.  Pt is unvaccinated to [place].   ED Course: In ED, pt is satting 100% on room air persistently.  Reports no subjective respiratory complaints.  Despite this, CXR shows multifocal PNA findings, and COVID is positive.  Tm 103.1  WBC nl, no other SIRS.  Creat 1.3.  EDP is concerned about AMS and wants pt admitted for observation.   Review of Systems: As per HPI, otherwise all review of systems negative.  Past Medical History:  Diagnosis Date  . ALLERGIC RHINITIS 10/27/2007  . ANKLE PAIN, RIGHT 11/30/2009  . ASTHMA 10/27/2007  . BACK PAIN 10/08/2009  . BENIGN PROSTATIC HYPERTROPHY 10/27/2007  . CHRONIC OBSTRUCTIVE PULMONARY DISEASE, ACUTE EXACERBATION 11/22/2007  . COLONIC POLYPS, HX OF 10/27/2007  . COPD 10/27/2007  . CORONARY ARTERY DISEASE 10/27/2007  . Dementia ([place]) 01/13/2013  . DEPRESSION 11/30/2009  . FATIGUE 11/30/2009  . Hemoptysis 04/24/2009  . HYPERLIPIDEMIA 04/03/2007  . HYPERTENSION 04/03/2007  . MYALGIA 02/04/2008  . Obstructive chronic bronchitis without exacerbation ([place])   . Pain in Soft Tissues of Limb 02/04/2008  . PARESTHESIA 09/02/2010  . PERIPHERAL EDEMA 11/16/2009  . Peripheral neuropathy 03/08/2012  . PERIPHERAL VASCULAR DISEASE 10/27/2007  . 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 RASH-NONVESICULAR 10/27/2007  . SCIATICA, LEFT 11/08/2009  . SHOULDER PAIN, LEFT 06/21/2009  . SINUSITIS-  ACUTE-NOS 10/08/2009  . VITAMIN D DEFICIENCY 06/04/2010    Past Surgical History:  Procedure Laterality Date  . None       reports that he has never smoked. He has never used smokeless tobacco. He reports that he does not drink alcohol and does not use drugs.  Allergies  Allergen Reactions  . Atorvastatin Other (See Comments)    REACTION: myalgias  . Simvastatin Other (See Comments)    REACTION: myalgias  . Zetia [Ezetimibe] Other (See Comments)    weakness  . Ace Inhibitors Other (See Comments)    unknown  . Penicillins Other (See Comments)    Unknown;  Tolerated Ceftriaxone 10/2018    Family History  Problem Relation Age of Onset  . Cancer Mother        BREAST  . Cancer Father        LUNG  . Hypertension Sister        3 sisters  . Anuerysm Brother        died with  . Stroke Other   . Cancer Other        lung and breast  . Alcohol abuse Other      Prior to Admission medications   Medication Sig Start Date End Date Taking? Authorizing Provider  acetaminophen (TYLENOL) 325 MG tablet Take 2 tablets (650 mg total) by mouth every 6 (six) hours as needed for mild pain (or Fever >/= 101). 11/05/18   Sheikh, Omair Latif, DO  amLODipine (NORVASC) 5 MG tablet TAKE 1 TABLET EVERY DAY  11/23/19   Biagio Borg, MD  aspirin EC 81 MG tablet Take 1 tablet (81 mg total) by mouth daily. 10/22/17   Biagio Borg, MD  donepezil (ARICEPT) 10 MG tablet TAKE 1 TABLET EVERY DAY  AT  2AM 12/21/19   Biagio Borg, MD  HYDROcodone-acetaminophen [facility]) 7.5-325 MG tablet Take 1 tablet by mouth every 6 (six) hours as needed for moderate pain. 03/16/20   Biagio Borg, MD  ipratropium-albuterol (DUONEB) 0.5-2.5 (3) MG/3ML SOLN Take 3 mLs by nebulization every 6 (six) hours as needed (for cough or shortness of breath). 08/18/19   Candee Furbish, MD  Multiple Vitamins-Minerals (ALIVE MENS ENERGY) TABS Take 1 tablet by mouth daily.    [provider]  oxyCODONE (OXY IR/ROXICODONE) 5 MG  immediate release tablet Take 1 tablet (5 mg total) by mouth every 4 (four) hours as needed for severe pain. 03/05/20   Janith Lima, MD  promethazine-dextromethorphan (PROMETHAZINE-DM) 6.25-15 MG/5ML syrup Take 5 mLs by mouth 4 (four) times daily as needed for cough. 08/04/19   Biagio Borg, MD  QUEtiapine (SEROQUEL) 25 MG tablet 1 tab by mouth twice per day 03/16/20   Biagio Borg, MD  Respiratory Therapy Supplies (FLUTTER) DEVI 1 Device by Does not apply route as needed. 09/05/19   Candee Furbish, MD    Physical Exam: Vitals:   05/21/20 1953 05/21/20 2240 05/21/20 2247 05/21/20 2300  BP: (!) 139/111 (!) 155/94  134/87  Pulse: 100 72  69  Resp: 20 15  16   Temp: (!) 103 F (39.4 C)  99 F (37.2 C)   TempSrc: Oral  Oral   SpO2: 98% 98%  100%  Weight:      Height:        Constitutional: NAD, calm, comfortable Eyes: PERRL, lids and conjunctivae normal ENMT: Mucous membranes are moist. Posterior pharynx clear of any exudate or lesions.Normal dentition.  Neck: normal, supple, no masses, no thyromegaly Respiratory: Few faint expiratory wheezes, good air movement, no accessory muscle use. Cardiovascular: Regular rate and rhythm, no murmurs / rubs / gallops. No extremity edema. 2+ pedal pulses. No carotid bruits.  Abdomen: no tenderness, no masses palpated. No hepatosplenomegaly. Bowel sounds positive.  Musculoskeletal: no clubbing / cyanosis. No joint deformity upper and lower extremities. Good ROM, no contractures. Normal muscle tone.  Skin: no rashes, lesions, ulcers. No induration Neurologic: CN 2-12 grossly intact. Sensation intact, DTR normal. Strength 5/5 in all 4.  Psychiatric: Normal judgment and insight. Alert and oriented x 3. Normal mood.    Labs on Admission: I have personally reviewed following labs and imaging studies  CBC: Recent Labs  Lab 05/21/20 1612  WBC 5.5  NEUTROABS 4.5  HGB 12.8*  HCT 42.1  MCV 81.3  PLT 235   Basic Metabolic Panel: Recent Labs   Lab 05/21/20 1612  NA 142  K 4.3  CL 102  CO2 29  GLUCOSE 97  BUN 25*  CREATININE 1.37*  CALCIUM 9.3   GFR: Estimated Creatinine Clearance: 39.3 mL/min (A) (by C-G formula based on SCr of 1.37 mg/dL (H)). Liver Function Tests: Recent Labs  Lab 05/21/20 1612  AST 20  ALT 14  ALKPHOS 67  BILITOT 0.6  PROT 7.4  ALBUMIN 3.2*   No results for input(s): LIPASE, AMYLASE in the last 168 hours. No results for input(s): AMMONIA in the last 168 hours. Coagulation Profile: Recent Labs  Lab 05/21/20 1612  INR 1.1   Cardiac Enzymes: No results  for input(s): CKTOTAL, CKMB, CKMBINDEX, TROPONINI in the last 168 hours. BNP (last 3 results) No results for input(s): PROBNP in the last 8760 hours. HbA1C: No results for input(s): HGBA1C in the last 72 hours. CBG: No results for input(s): GLUCAP in the last 168 hours. Lipid Profile: No results for input(s): CHOL, HDL, LDLCALC, TRIG, CHOLHDL, LDLDIRECT in the last 72 hours. Thyroid Function Tests: No results for input(s): TSH, T4TOTAL, FREET4, T3FREE, THYROIDAB in the last 72 hours. Anemia Panel: No results for input(s): VITAMINB12, FOLATE, FERRITIN, TIBC, IRON, RETICCTPCT in the last 72 hours. Urine analysis:    Component Value Date/Time   COLORURINE YELLOW 04/27/2020 2108   APPEARANCEUR CLOUDY (A) 04/27/2020 2108   LABSPEC 1.020 04/27/2020 2108   PHURINE 6.5 04/27/2020 2108   GLUCOSEU NEGATIVE 04/27/2020 2108   GLUCOSEU NEGATIVE 08/18/2019 1148   HGBUR LARGE (A) 04/27/2020 2108   BILIRUBINUR NEGATIVE 04/27/2020 2108   BILIRUBINUR neg 08/04/2019 1643   KETONESUR NEGATIVE 04/27/2020 2108   PROTEINUR 100 (A) 04/27/2020 2108   UROBILINOGEN 0.2 08/18/2019 1148   NITRITE NEGATIVE 04/27/2020 2108   LEUKOCYTESUR TRACE (A) 04/27/2020 2108    Radiological Exams on Admission: DG Chest Portable 1 View  Result Date: 05/21/2020 CLINICAL DATA:  Cough, fever EXAM: PORTABLE CHEST 1 VIEW COMPARISON:  03/30/2020 FINDINGS: Stable  appearance of cardiomediastinal contours. Increased density in the RIGHT upper and mid chest since the prior study. Mild increased interstitial markings. Basilar opacities appear to be improved with some retrocardiac opacification on the LEFT. On limited assessment no acute skeletal process. IMPRESSION: 1. Increased density in the RIGHT upper and mid chest since the prior study this may represent developing pneumonia. Suggest follow-up to ensure resolution. 2. Basilar opacities appear to be improved with some retrocardiac opacification on the LEFT. 3. Mild increased interstitial markings. Electronically Signed   By: Zetta Bills M.D.   On: 05/21/2020 19:05    EKG: Independently reviewed.  Assessment/Plan Principal Problem:   Acute metabolic encephalopathy Active Problems:   Essential hypertension   COPD (chronic obstructive pulmonary disease) (HCC)   Dementia, Alzheimer's, with behavior disturbance (HCC)   Pneumonia due to COVID-19 virus    1. Acute metabolic encephalopathy - 1. Likely delirium due to fever on top of chronic dementia 2. Place in obs 3. Tylenol PRN fever 2. COVID-19 PNA - 1. No O2 requirement at this point, satting 100% on RA 2. Will start MAB therapy 3. Daily labs 4. Cont pulse ox 3. COPD - 1. Albuterol QID scheduled 2. Cont home nebs when med rec completed 4. Dementia - 1. Cont home meds when med rec completed 5. HTN - 1. Cont home meds when med rec completed 6. Bladder tumor - 1. Causing hematuria earlier this month 2. Is scheduled for TURBT next week, though suspect this may end up delayed due to COVID-19.  DVT prophylaxis: Lovenox Code Status: Full Family Communication: No family in room, EDP spoke with daughter Disposition Plan: Home when AMS resolved Consults called: None Admission status: Place in 62    Bekah Igoe, [place] [facility]  How to contact the [facility] Attending or Consulting provider [place] or covering provider during after  hours [place], for this patient?  1. Check the care team in [facility] and look for a) attending/consulting [facility] provider listed and b) the [facility] team listed 2. Log into [email or web address]  Amion Physician Scheduling and messaging for groups and whole hospitals  On call and physician scheduling software for group practices, residents,  hospitalists and other medical providers for call, clinic, rotation and shift schedules. OnCall Enterprise is a hospital-wide system for scheduling doctors and paging doctors on call. EasyPlot is for scientific plotting and data analysis.  [email or web address]  and use [place]'s universal password to access. If you do not have the password, please contact the hospital operator.  3. Locate the [facility] provider you are looking for under [facility] and page to a number that you can be directly reached. 4. If you still have difficulty reaching the provider, please page the [facility] (Director on Call) for the Hospitalists listed on amion for assistance.  05/22/2020, 12:17 AM

## 2020-05-22 NOTE — Progress Notes (Signed)
[facility] PROGRESS NOTE    Progress Note  Matthew Gray  NTI:[id number] DOB: Jul 13, 1934 DOA: 05/21/2020 PCP: Biagio Borg, MD     Brief Narrative:   Matthew Gray is an 86 y.o. male past medical history significant for dementia, COPD CAD comes in for 3 days of generalized weakness fatigue and altered mental status, chest x-ray showing bilateral infiltrate, SARS-CoV-2 was positive.  Assessment/Plan:   Acute metabolic encephalopathy possibly due to COVID-19 encephalopathy: Question delirium superimposed on chronic dementia in the setting of COVID-19. The change in mental status could also be due to to COVID-19 encephalopathy. Point in time we will treat him conservatively, we have no documentation whether he is vaccinated or not, I try to contact the family and has been unsuccessful. He is at very high risk of aspiration. Use Haldol IV as needed for agitation.  Covid 19 pneumonia: Currently on no oxygen requirements are 100% on room air, chest x-ray does show bilateral infiltrates. Keep a close eye on his saturations. Agree with monoclonal antibodies.  COPD: Continue inhalers.  Advanced dementia: Continue current home regimen, continue Namenda and Seroquel twice a day  Essential hypertension Resume Norvasc blood pressure slightly elevated.    DVT prophylaxis: lovenxo Family Communication:none Status is: Inpatient  Inpatient he is currently on stable.  Dispo: The patient is from: SNF              Anticipated d/c is to: SNF              Anticipated d/c date is: 3 days              Patient currently is not medically stable to d/c.        Code Status:     Code Status Orders  (From admission, onward)         Start     Ordered   05/22/20 0010  Full code  Continuous        05/22/20 0012        Code Status History    Date Active Date Inactive Code Status Order ID Comments User Context   11/02/2018 1739 11/05/2018 1649 Full Code [id number]  Barb Merino, MD Inpatient   Advance Care Planning Activity        IV Access:    Peripheral IV   Procedures and diagnostic studies:   DG Chest Portable 1 View  Result Date: 05/21/2020 CLINICAL DATA:  Cough, fever EXAM: PORTABLE CHEST 1 VIEW COMPARISON:  03/30/2020 FINDINGS: Stable appearance of cardiomediastinal contours. Increased density in the RIGHT upper and mid chest since the prior study. Mild increased interstitial markings. Basilar opacities appear to be improved with some retrocardiac opacification on the LEFT. On limited assessment no acute skeletal process. IMPRESSION: 1. Increased density in the RIGHT upper and mid chest since the prior study this may represent developing pneumonia. Suggest follow-up to ensure resolution. 2. Basilar opacities appear to be improved with some retrocardiac opacification on the LEFT. 3. Mild increased interstitial markings. Electronically Signed   By: Zetta Bills M.D.   On: 05/21/2020 19:05     Medical Consultants:    None.  Anti-Infectives:   none  Subjective:    Matthew Gray only responsive to name relates he is thirsty.  Objective:    Vitals:   05/22/20 0400 05/22/20 0500 05/22/20 0600 05/22/20 0700  BP: (!) 164/86 (!) 160/95 (!) 151/103 (!) 169/107  Pulse: 73 73 78 81  Resp: 18 18 20 19   Temp:  TempSrc:      SpO2: 97% 92% 97% 98%  Weight:      Height:       SpO2: 98 %  No intake or output data in the 24 hours ending 05/22/20 0753 Filed Weights   05/21/20 1609  Weight: 68 kg    Exam: General exam: In no acute distress. Respiratory system: Good air movement and clear to auscultation. Cardiovascular system: S1 & S2 heard, RRR. No JVD. Gastrointestinal system: Abdomen is nondistended, soft and nontender.  Extremities: No pedal edema. Skin: No rashes, lesions or ulcers   Data Reviewed:    Labs: Basic Metabolic Panel: Recent Labs  Lab 05/21/20 1612 05/22/20 0547  NA 142 141  K 4.3 4.3  CL 102 101  CO2  29 30  GLUCOSE 97 83  BUN 25* 25*  CREATININE 1.37* 1.17  CALCIUM 9.3 9.0   GFR Estimated Creatinine Clearance: 46 mL/min (by C-G formula based on SCr of 1.17 mg/dL). Liver Function Tests: Recent Labs  Lab 05/21/20 1612 05/22/20 0547  AST 20 35  ALT 14 13  ALKPHOS 67 60  BILITOT 0.6 1.3*  PROT 7.4 7.2  ALBUMIN 3.2* 3.0*   No results for input(s): LIPASE, AMYLASE in the last 168 hours. No results for input(s): AMMONIA in the last 168 hours. Coagulation profile Recent Labs  Lab 05/21/20 1612  INR 1.1   COVID-19 Labs  Recent Labs    05/22/20 0240 05/22/20 0547  DDIMER  --  3.14*  FERRITIN  --  491*  LDH 176  --   CRP  --  7.0*    Lab Results  Component Value Date   SARSCOV2NAA POSITIVE (A) 05/21/2020    CBC: Recent Labs  Lab 05/21/20 1612 05/22/20 0547  WBC 5.5 3.9*  NEUTROABS 4.5 2.6  HGB 12.8* 12.9*  HCT 42.1 42.1  MCV 81.3 82.5  PLT 222 202   Cardiac Enzymes: No results for input(s): CKTOTAL, CKMB, CKMBINDEX, TROPONINI in the last 168 hours. BNP (last 3 results) No results for input(s): PROBNP in the last 8760 hours. CBG: No results for input(s): GLUCAP in the last 168 hours. D-Dimer: Recent Labs    05/22/20 0547  DDIMER 3.14*   Hgb A1c: No results for input(s): HGBA1C in the last 72 hours. Lipid Profile: Recent Labs    05/22/20 0240  TRIG 48   Thyroid function studies: No results for input(s): TSH, T4TOTAL, T3FREE, THYROIDAB in the last 72 hours.  Invalid input(s): FREET3 Anemia work up: Recent Labs    05/22/20 0547  FERRITIN 491*   Sepsis Labs: Recent Labs  Lab 05/21/20 1612 05/21/20 1630 05/22/20 0240 05/22/20 0547  PROCALCITON  --   --  <0.10  --   WBC 5.5  --   --  3.9*  LATICACIDVEN  --  1.4  --   --    Microbiology Recent Results (from the past 240 hour(s))  Respiratory Panel by RT PCR (Flu A&B, Covid) - Nasopharyngeal Swab     Status: Abnormal   Collection Time: 05/21/20  4:14 PM   Specimen: Nasopharyngeal  Swab  Result Value Ref Range Status   SARS Coronavirus 2 by RT PCR POSITIVE (A) NEGATIVE Final    Comment: RESULT CALLED TO, READ BACK BY AND VERIFIED WITH: K PATE RN 05/21/20 AT 1745 SK (NOTE) SARS-CoV-2 target nucleic acids are DETECTED.  SARS-CoV-2 RNA is generally detectable in upper respiratory specimens  during the acute phase of infection. Positive results are indicative of the presence  of the identified virus, but do not rule out bacterial infection or co-infection with other pathogens not detected by the test. Clinical correlation with patient history and other diagnostic information is necessary to determine patient infection status. The expected result is Negative.  Fact Sheet for Patients:  PinkCheek.be  Fact Sheet for Healthcare Providers: GravelBags.it  This test is not yet approved or cleared by the [place] FDA and  has been authorized for detection and/or diagnosis of SARS-CoV-2 by FDA under an Emergency Use Authorization (EUA).  This EUA will remain in effect (meaning this test can be used)  for the duration of  the COVID-19 declaration under Section 564(b)(1) of the Act, 21 U.S.C. section 360bbb-3(b)(1), unless the authorization is terminated or revoked sooner.      Influenza A by PCR NEGATIVE NEGATIVE Final   Influenza B by PCR NEGATIVE NEGATIVE Final    Comment: (NOTE) The Xpert Xpress SARS-CoV-2/FLU/RSV assay is intended as an aid in  the diagnosis of influenza from Nasopharyngeal swab specimens and  should not be used as a sole basis for treatment. Nasal washings and  aspirates are unacceptable for Xpert Xpress SARS-CoV-2/FLU/RSV  testing.  Fact Sheet for Patients: PinkCheek.be  Fact Sheet for Healthcare Providers: GravelBags.it  This test is not yet approved or cleared by the [place] FDA and  has been authorized for  detection and/or diagnosis of SARS-CoV-2 by  FDA under an Emergency Use Authorization (EUA). This EUA will remain  in effect (meaning this test can be used) for the duration of the  Covid-19 declaration under Section 564(b)(1) of the Act, 21  U.S.C. section 360bbb-3(b)(1), unless the authorization is  terminated or revoked. Performed at [facility], [place] [address], [place], [place] [address]   Culture, blood (Routine x 2)     Status: None (Preliminary result)   Collection Time: 05/21/20  4:54 PM   Specimen: BLOOD  Result Value Ref Range Status   Specimen Description BLOOD RIGHT ANTECUBITAL  Final   Special Requests   Final    BOTTLES DRAWN AEROBIC AND ANAEROBIC Blood Culture adequate volume   Culture   Final    NO GROWTH < 24 HOURS Performed at [facility], [place] [address], [place], [place] [address]    Report Status PENDING  Incomplete  Culture, blood (Routine x 2)     Status: None (Preliminary result)   Collection Time: 05/21/20 10:45 PM   Specimen: BLOOD  Result Value Ref Range Status   Specimen Description BLOOD LEFT ANTECUBITAL  Final   Special Requests   Final    BOTTLES DRAWN AEROBIC ONLY Blood Culture results may not be optimal due to an inadequate volume of blood received in culture bottles   Culture   Final    NO GROWTH < 12 HOURS Performed at [facility], [place] [address], [place], [place] [address]    Report Status PENDING  Incomplete     Medications:   . AeroChamber Plus Flo-Vu Large  1 each Other Once  . albuterol  2 puff Inhalation Q6H  . amLODipine  5 mg Oral Daily  . aspirin EC  81 mg Oral Daily  . donepezil  10 mg Oral QHS  . enoxaparin (LOVENOX) injection  40 mg Subcutaneous Q24H  . QUEtiapine  25 mg Oral BID   Continuous Infusions: . sodium chloride    . famotidine (PEPCID) IV        LOS: 0 days   Matthew Gray  Olevia Bowens  [facility]  05/22/2020, 7:53 AM

## 2020-05-22 NOTE — Progress Notes (Signed)
Matthew Gray is a 86 y.o. male patient admitted from ED awake, alert - oriented  X 1- no acute distress noted.  VSS - Blood pressure (!) 131/94, pulse 92, temperature 98.3 F (36.8 C), temperature source Axillary, resp. rate 20, height 5\' 8"  (1.727 m), weight 68 kg, SpO2 100 %.    IV in place, occlusive dsg intact without redness.  Orientation to room, and floor completed with information packet given to patient/family.  Patient declined safety video at this time.  Admission INP armband ID verified with patient/family, and in place.   SR up x 2, fall assessment complete, with patient and family able to verbalize understanding of risk associated with falls, and verbalized understanding to call nsg before up out of bed.  Call light within reach, patient able to voice, and demonstrate understanding.  Skin, clean-dry- intact without evidence of bruising, or skin tears.   Pt noted to have stage 2 pressure injury on coccyx noted on admission  .     Will cont to eval and treat per MD orders.  Luci Bank, RN 05/22/2020 4:18 PM

## 2020-05-22 NOTE — Telephone Encounter (Signed)
Sent to Dr. John. 

## 2020-05-23 LAB — COMPREHENSIVE METABOLIC PANEL
ALT: 19 U/L (ref 0–44)
AST: 41 U/L (ref 15–41)
Albumin: 3.1 g/dL — ABNORMAL LOW (ref 3.5–5.0)
Alkaline Phosphatase: 65 U/L (ref 38–126)
Anion gap: 19 — ABNORMAL HIGH (ref 5–15)
BUN: 22 mg/dL (ref 8–23)
CO2: 20 mmol/L — ABNORMAL LOW (ref 22–32)
Calcium: 8.8 mg/dL — ABNORMAL LOW (ref 8.9–10.3)
Chloride: 101 mmol/L (ref 98–111)
Creatinine, Ser: 1.09 mg/dL (ref 0.61–1.24)
GFR calc Af Amer: >60 mL/min (ref >60)
GFR calc non Af Amer: >60 mL/min (ref >60)
Glucose, Bld: 68 mg/dL — ABNORMAL LOW (ref 70–99)
Potassium: 3.9 mmol/L (ref 3.5–5.1)
Sodium: 140 mmol/L (ref 135–145)
Total Bilirubin: 0.7 mg/dL (ref 0.3–1.2)
Total Protein: 7.5 g/dL (ref 6.5–8.1)

## 2020-05-23 LAB — CBC WITH DIFFERENTIAL/PLATELET
Abs Immature Granulocytes: 0.02 10*3/uL (ref 0.00–0.07)
Basophils Absolute: 0 10*3/uL (ref 0.0–0.1)
Basophils Relative: 0 %
Eosinophils Absolute: 0 10*3/uL (ref 0.0–0.5)
Eosinophils Relative: 0 %
HCT: 46.2 % (ref 39.0–52.0)
Hemoglobin: 14.3 g/dL (ref 13.0–17.0)
Immature Granulocytes: 0 %
Lymphocytes Relative: 16 %
Lymphs Abs: 0.7 10*3/uL (ref 0.7–4.0)
MCH: 25.1 pg — ABNORMAL LOW (ref 26.0–34.0)
MCHC: 31 g/dL (ref 30.0–36.0)
MCV: 81.2 fL (ref 80.0–100.0)
Monocytes Absolute: 0.7 10*3/uL (ref 0.1–1.0)
Monocytes Relative: 16 %
Neutro Abs: 3.2 10*3/uL (ref 1.7–7.7)
Neutrophils Relative %: 68 %
Platelets: 255 10*3/uL (ref 150–400)
RBC: 5.69 MIL/uL (ref 4.22–5.81)
RDW: 15.8 % — ABNORMAL HIGH (ref 11.5–15.5)
WBC: 4.7 10*3/uL (ref 4.0–10.5)
nRBC: 0 % (ref 0.0–0.2)

## 2020-05-23 LAB — C-REACTIVE PROTEIN: CRP: 5.9 mg/dL — ABNORMAL HIGH (ref <1.0)

## 2020-05-23 LAB — D-DIMER, QUANTITATIVE: D-Dimer, Quant: 3.66 ug/mL-FEU — ABNORMAL HIGH (ref 0.00–0.50)

## 2020-05-23 MED ORDER — SODIUM CHLORIDE 0.9 % IV SOLN
200.0000 mg | Freq: Once | INTRAVENOUS | Status: AC
  Administered 2020-05-23: 200 mg via INTRAVENOUS
  Filled 2020-05-23: qty 40

## 2020-05-23 MED ORDER — DEXAMETHASONE 6 MG PO TABS
6.0000 mg | ORAL_TABLET | Freq: Every day | ORAL | Status: DC
  Administered 2020-05-23 – 2020-05-27 (×5): 6 mg via ORAL
  Filled 2020-05-23 (×5): qty 1

## 2020-05-23 MED ORDER — SODIUM CHLORIDE 0.9 % IV SOLN
200.0000 mg | Freq: Once | INTRAVENOUS | Status: DC
  Filled 2020-05-23: qty 40

## 2020-05-23 MED ORDER — QUETIAPINE FUMARATE 25 MG PO TABS
25.0000 mg | ORAL_TABLET | Freq: Every day | ORAL | Status: DC
  Administered 2020-05-24 – 2020-05-25 (×2): 25 mg via ORAL
  Filled 2020-05-23 (×2): qty 1

## 2020-05-23 MED ORDER — SODIUM CHLORIDE 0.9 % IV SOLN
100.0000 mg | Freq: Every day | INTRAVENOUS | Status: AC
  Administered 2020-05-24 – 2020-05-27 (×4): 100 mg via INTRAVENOUS
  Filled 2020-05-23 (×4): qty 20

## 2020-05-23 MED ORDER — SODIUM CHLORIDE 0.9 % IV SOLN: 100.0000 mg | Freq: Every day | INTRAVENOUS | Status: DC

## 2020-05-23 NOTE — Progress Notes (Signed)
Spoke with pt's daughter, Merland Holness, and gave an update on pt.

## 2020-05-23 NOTE — Progress Notes (Addendum)
PROGRESS NOTE  Matthew Gray KNL:[id number] DOB: [birth year] DOA: 05/21/2020 PCP: Biagio Borg, MD   LOS: 1 day   Brief Narrative / Interim history: Matthew Gray is an 86 y.o. male past medical history significant for dementia, COPD CAD comes in for 3 days of generalized weakness fatigue and altered mental status, chest x-ray showing bilateral infiltrate, SARS-CoV-2 was positive.  Subjective / 24h Interval events: Complains of a cough this morning, denies shortness of breath. No chest pain, no abdominal pain, no nausea or vomiting  Assessment & Plan:  Principal Problem Covid-19 Viral Illness /pneumonia -Chest x-ray on admission showed developing multifocal pneumonia, patient without significant shortness of breath however has been having respiratory symptoms with a cough which has been productive -Start remdesivir, steroids, oxygen saturations when I was in the room dipped to 88%  -Patient has received monoclonal antibodies as well -Continue to monitor inflammatory markers   COVID-19 Labs  Recent Labs    05/22/20 0240 05/22/20 0547  DDIMER  --  3.14*  FERRITIN  --  491*  LDH 176  --   CRP  --  7.0*    Lab Results  Component Value Date   SARSCOV2NAA POSITIVE (A) 05/21/2020   Active Problems COPD-stable, no wheezing, continue inhalers  Advanced dementia-continue home regimen  Essential hypertension-continue Norvasc  Pressure injury, POA Pressure Injury 05/22/20 Coccyx Stage 2 -  Partial thickness loss of dermis presenting as a shallow open injury with a red, pink wound bed without slough. (Active)  05/22/20 1622  Location: Coccyx  Location Orientation:   Staging: Stage 2 -  Partial thickness loss of dermis presenting as a shallow open injury with a red, pink wound bed without slough.  Wound Description (Comments):   Present on Admission: Yes   Scheduled Meds: . AeroChamber Plus Flo-Vu Large  1 each Other Once  . albuterol  2 puff Inhalation Q6H  . amLODipine  5 mg  Oral Daily  . aspirin EC  81 mg Oral Daily  . dexamethasone  6 mg Oral Daily  . donepezil  10 mg Oral QHS  . enoxaparin (LOVENOX) injection  40 mg Subcutaneous Q24H  . QUEtiapine  25 mg Oral BID   Continuous Infusions: . sodium chloride    . famotidine (PEPCID) IV     PRN Meds:.sodium chloride, acetaminophen, albuterol, chlorpheniramine-HYDROcodone, diphenhydrAMINE, EPINEPHrine, famotidine (PEPCID) IV, guaiFENesin-dextromethorphan, haloperidol lactate, methylPREDNISolone (SOLU-MEDROL) injection, ondansetron **OR** ondansetron (ZOFRAN) IV, oxyCODONE  DVT prophylaxis: Lovenox Code Status: Full code Family Communication: will call daughter in the afternoon   Status is: Inpatient  Remains inpatient appropriate because:IV treatments appropriate due to intensity of illness or inability to take PO   Dispo: The patient is from: Home              Anticipated d/c is to: Home              Anticipated d/c date is: 3 days              Patient currently is not medically stable to d/c.  Consultants:  None   Procedures:  None   Microbiology: None   Antibacterials: None    Objective: Vitals:   05/22/20 1603 05/22/20 2017 05/23/20 0012 05/23/20 0352  BP: (!) 131/94 (!) 143/95 (!) 137/98 (!) 151/101  Pulse: 92 79 84 78  Resp: 20 19 17 11   Temp: 98.3 F (36.8 C) 98.2 F (36.8 C) 98.3 F (36.8 C) 98.6 F (37 C)  TempSrc: Axillary Oral Axillary Axillary  SpO2: 100% 97% 96% 96%  Weight:      Height:        Intake/Output Summary (Last 24 hours) at 05/23/2020 1004 Last data filed at 05/23/2020 0647 Gross per 24 hour  Intake 240 ml  Output 500 ml  Net -260 ml   Filed Weights   05/21/20 1609  Weight: 68 kg    Examination:  Constitutional: NAD Eyes: no scleral icterus ENMT: Mucous membranes are moist.  Neck: normal, supple Respiratory: Faint bibasilar rhonchi, no wheezing, productive cough present with deep breathing Cardiovascular: Regular rate and rhythm, no murmurs /  rubs / gallops.  Abdomen: non distended, no tenderness. Bowel sounds positive.  Musculoskeletal: no clubbing / cyanosis.  Skin: no rashes Neurologic: CN 2-12 grossly intact. Strength 5/5 in all 4.   Data Reviewed: I have independently reviewed following labs and imaging studies   CBC: Recent Labs  Lab 05/21/20 1612 05/22/20 0547 05/23/20 0847  WBC 5.5 3.9* 4.7  NEUTROABS 4.5 2.6 3.2  HGB 12.8* 12.9* 14.3  HCT 42.1 42.1 46.2  MCV 81.3 82.5 81.2  PLT 222 202 607   Basic Metabolic Panel: Recent Labs  Lab 05/21/20 1612 05/22/20 0547  NA 142 141  K 4.3 4.3  CL 102 101  CO2 29 30  GLUCOSE 97 83  BUN 25* 25*  CREATININE 1.37* 1.17  CALCIUM 9.3 9.0   GFR: Estimated Creatinine Clearance: 46 mL/min (by C-G formula based on SCr of 1.17 mg/dL). Liver Function Tests: Recent Labs  Lab 05/21/20 1612 05/22/20 0547  AST 20 35  ALT 14 13  ALKPHOS 67 60  BILITOT 0.6 1.3*  PROT 7.4 7.2  ALBUMIN 3.2* 3.0*   No results for input(s): LIPASE, AMYLASE in the last 168 hours. No results for input(s): AMMONIA in the last 168 hours. Coagulation Profile: Recent Labs  Lab 05/21/20 1612  INR 1.1   Cardiac Enzymes: No results for input(s): CKTOTAL, CKMB, CKMBINDEX, TROPONINI in the last 168 hours. BNP (last 3 results) No results for input(s): PROBNP in the last 8760 hours. HbA1C: No results for input(s): HGBA1C in the last 72 hours. CBG: No results for input(s): GLUCAP in the last 168 hours. Lipid Profile: Recent Labs    05/22/20 0240  TRIG 48   Thyroid Function Tests: No results for input(s): TSH, T4TOTAL, FREET4, T3FREE, THYROIDAB in the last 72 hours. Anemia Panel: Recent Labs    05/22/20 0547  FERRITIN 491*   Urine analysis:    Component Value Date/Time   COLORURINE YELLOW 05/22/2020 0400   APPEARANCEUR HAZY (A) 05/22/2020 0400   LABSPEC 1.011 05/22/2020 0400   PHURINE 7.0 05/22/2020 0400   GLUCOSEU NEGATIVE 05/22/2020 0400   GLUCOSEU NEGATIVE 08/18/2019 1148    HGBUR LARGE (A) 05/22/2020 0400   BILIRUBINUR NEGATIVE 05/22/2020 0400   BILIRUBINUR neg 08/04/2019 1643   KETONESUR NEGATIVE 05/22/2020 0400   PROTEINUR 100 (A) 05/22/2020 0400   UROBILINOGEN 0.2 08/18/2019 1148   NITRITE NEGATIVE 05/22/2020 0400   LEUKOCYTESUR TRACE (A) 05/22/2020 0400   Sepsis Labs: Invalid input(s): PROCALCITONIN, LACTICIDVEN  Recent Results (from the past 240 hour(s))  Respiratory Panel by RT PCR (Flu A&B, Covid) - Nasopharyngeal Swab     Status: Abnormal   Collection Time: 05/21/20  4:14 PM   Specimen: Nasopharyngeal Swab  Result Value Ref Range Status   SARS Coronavirus 2 by RT PCR POSITIVE (A) NEGATIVE Final    Comment: RESULT CALLED TO, READ BACK BY AND VERIFIED WITH: K PATE RN 05/21/20 AT  1745 SK (NOTE) SARS-CoV-2 target nucleic acids are DETECTED.  SARS-CoV-2 RNA is generally detectable in upper respiratory specimens  during the acute phase of infection. Positive results are indicative of the presence of the identified virus, but do not rule out bacterial infection or co-infection with other pathogens not detected by the test. Clinical correlation with patient history and other diagnostic information is necessary to determine patient infection status. The expected result is Negative.  Fact Sheet for Patients:  PinkCheek.be  Fact Sheet for Healthcare Providers: GravelBags.it  This test is not yet approved or cleared by the [place] FDA and  has been authorized for detection and/or diagnosis of SARS-CoV-2 by FDA under an Emergency Use Authorization (EUA).  This EUA will remain in effect (meaning this test can be used)  for the duration of  the COVID-19 declaration under Section 564(b)(1) of the Act, 21 U.S.C. section 360bbb-3(b)(1), unless the authorization is terminated or revoked sooner.      Influenza A by PCR NEGATIVE NEGATIVE Final   Influenza B by PCR NEGATIVE NEGATIVE  Final    Comment: (NOTE) The Xpert Xpress SARS-CoV-2/FLU/RSV assay is intended as an aid in  the diagnosis of influenza from Nasopharyngeal swab specimens and  should not be used as a sole basis for treatment. Nasal washings and  aspirates are unacceptable for Xpert Xpress SARS-CoV-2/FLU/RSV  testing.  Fact Sheet for Patients: PinkCheek.be  Fact Sheet for Healthcare Providers: GravelBags.it  This test is not yet approved or cleared by the [place] FDA and  has been authorized for detection and/or diagnosis of SARS-CoV-2 by  FDA under an Emergency Use Authorization (EUA). This EUA will remain  in effect (meaning this test can be used) for the duration of the  Covid-19 declaration under Section 564(b)(1) of the Act, 21  U.S.C. section 360bbb-3(b)(1), unless the authorization is  terminated or revoked. Performed at [facility], [place] [address], [place], [place] [address]   Culture, blood (Routine x 2)     Status: None (Preliminary result)   Collection Time: 05/21/20  4:54 PM   Specimen: BLOOD  Result Value Ref Range Status   Specimen Description BLOOD RIGHT ANTECUBITAL  Final   Special Requests   Final    BOTTLES DRAWN AEROBIC AND ANAEROBIC Blood Culture adequate volume   Culture   Final    NO GROWTH 2 DAYS Performed at [facility], [place] [address] [place], [place] [address]    Report Status PENDING  Incomplete  Culture, blood (Routine x 2)     Status: None (Preliminary result)   Collection Time: 05/21/20 10:45 PM   Specimen: BLOOD  Result Value Ref Range Status   Specimen Description BLOOD LEFT ANTECUBITAL  Final   Special Requests   Final    BOTTLES DRAWN AEROBIC ONLY Blood Culture results may not be optimal due to an inadequate volume of blood received in culture bottles   Culture   Final    NO GROWTH 2 DAYS Performed at [facility], [place] [address] [place], [place] [address]    Report  Status PENDING  Incomplete      Radiology Studies: DG Chest Portable 1 View  Result Date: 05/21/2020 CLINICAL DATA:  Cough, fever EXAM: PORTABLE CHEST 1 VIEW COMPARISON:  03/30/2020 FINDINGS: Stable appearance of cardiomediastinal contours. Increased density in the RIGHT upper and mid chest since the prior study. Mild increased interstitial markings. Basilar opacities appear to be improved with some retrocardiac opacification on the  LEFT. On limited assessment no acute skeletal process. IMPRESSION: 1. Increased density in the RIGHT upper and mid chest since the prior study this may represent developing pneumonia. Suggest follow-up to ensure resolution. 2. Basilar opacities appear to be improved with some retrocardiac opacification on the LEFT. 3. Mild increased interstitial markings. Electronically Signed   By: Zetta Bills M.D.   On: 05/21/2020 19:05    Marzetta Board, MD, PhD [facility]  Between 7 am - 7 pm I am available, please contact me via Amion or Securechat  Between 7 pm - 7 am I am not available, please contact night coverage MD/APP via Amion

## 2020-05-23 NOTE — Plan of Care (Signed)
  Problem: Clinical Measurements: Goal: Ability to maintain clinical measurements within normal limits will improve Outcome: Progressing Goal: Will remain free from infection Outcome: Progressing Goal: Diagnostic test results will improve Outcome: Progressing Goal: Respiratory complications will improve Outcome: Progressing Goal: Cardiovascular complication will be avoided Outcome: Progressing   Problem: Nutrition: Goal: Adequate nutrition will be maintained Outcome: Progressing   Problem: Elimination: Goal: Will not experience complications related to bowel motility Outcome: Progressing Goal: Will not experience complications related to urinary retention Outcome: Progressing   Problem: Pain Managment: Goal: General experience of comfort will improve Outcome: Progressing   Problem: Skin Integrity: Goal: Risk for impaired skin integrity will decrease Outcome: Progressing   Problem: Respiratory: Goal: Will maintain a patent airway Outcome: Progressing Goal: Complications related to the disease process, condition or treatment will be avoided or minimized Outcome: Progressing   Problem: Education: Goal: Knowledge of General Education information will improve Description: Including pain rating scale, medication(s)/side effects and non-pharmacologic comfort measures Outcome: Not Progressing   Problem: Health Behavior/Discharge Planning: Goal: Ability to manage health-related needs will improve Outcome: Not Progressing   Problem: Activity: Goal: Risk for activity intolerance will decrease Outcome: Not Progressing   Problem: Coping: Goal: Level of anxiety will decrease Outcome: Not Progressing   Problem: Safety: Goal: Ability to remain free from injury will improve Outcome: Not Progressing   Problem: Education: Goal: Knowledge of risk factors and measures for prevention of condition will improve Outcome: Not Progressing   Problem: Coping: Goal: Psychosocial and  spiritual needs will be supported Outcome: Not Progressing

## 2020-05-23 NOTE — Progress Notes (Signed)
Pt arrived to unit, transported via bed by 5W staff.  Pt is sleeping, no signs of pain.  VS taken - stable, see Epic.  Admitted to telemetry, phone and call bell next to pt.

## 2020-05-23 NOTE — Progress Notes (Signed)
Call daughter, Luian Schumpert, to give update on pt. No answer. Notified Dr. Cruzita Lederer about family wanting an update from the provider. MD stated he will give family a call this afternoon.

## 2020-05-23 NOTE — Progress Notes (Signed)
Called [facility]N to give report but receiving nurse in with another pt at the time. Nurse stated she would call back shortly to take report.

## 2020-05-23 NOTE — Progress Notes (Signed)
Left message with pam gibson pt covid positive 05-21-2020 in [place] with covid pneumonia.

## 2020-05-23 NOTE — Significant Event (Signed)
Transferred pt from [phone] to [phone] w/ assistance from [place], [place]. Receiving RN is Coralyn Mark and questions and concerns were answered to the best of this RN's ability.

## 2020-05-23 NOTE — Plan of Care (Signed)
  Problem: Clinical Measurements: Goal: Ability to maintain clinical measurements within normal limits will improve Outcome: Progressing Goal: Will remain free from infection Outcome: Progressing Goal: Diagnostic test results will improve Outcome: Progressing Goal: Respiratory complications will improve Outcome: Progressing Goal: Cardiovascular complication will be avoided Outcome: Progressing   Problem: Nutrition: Goal: Adequate nutrition will be maintained Outcome: Progressing   Problem: Elimination: Goal: Will not experience complications related to bowel motility Outcome: Progressing Goal: Will not experience complications related to urinary retention Outcome: Progressing   Problem: Pain Managment: Goal: General experience of comfort will improve Outcome: Progressing   Problem: Safety: Goal: Ability to remain free from injury will improve Outcome: Progressing   Problem: Skin Integrity: Goal: Risk for impaired skin integrity will decrease Outcome: Progressing   Problem: Coping: Goal: Psychosocial and spiritual needs will be supported Outcome: Progressing   Problem: Respiratory: Goal: Will maintain a patent airway Outcome: Progressing Goal: Complications related to the disease process, condition or treatment will be avoided or minimized Outcome: Progressing   Problem: Education: Goal: Knowledge of General Education information will improve Description: Including pain rating scale, medication(s)/side effects and non-pharmacologic comfort measures Outcome: Not Progressing   Problem: Health Behavior/Discharge Planning: Goal: Ability to manage health-related needs will improve Outcome: Not Progressing   Problem: Activity: Goal: Risk for activity intolerance will decrease Outcome: Not Progressing   Problem: Coping: Goal: Level of anxiety will decrease Outcome: Not Progressing   Problem: Education: Goal: Knowledge of risk factors and measures for prevention  of condition will improve Outcome: Not Progressing

## 2020-05-24 ENCOUNTER — Other Ambulatory Visit: Payer: Self-pay

## 2020-05-24 DIAGNOSIS — L8990 Pressure ulcer of unspecified site, unspecified stage: Secondary | ICD-10-CM | POA: Insufficient documentation

## 2020-05-24 LAB — CBC WITH DIFFERENTIAL/PLATELET
Abs Immature Granulocytes: 0.01 10*3/uL (ref 0.00–0.07)
Basophils Absolute: 0 10*3/uL (ref 0.0–0.1)
Basophils Relative: 0 %
Eosinophils Absolute: 0 10*3/uL (ref 0.0–0.5)
Eosinophils Relative: 0 %
HCT: 41.5 % (ref 39.0–52.0)
Hemoglobin: 12.9 g/dL — ABNORMAL LOW (ref 13.0–17.0)
Immature Granulocytes: 0 %
Lymphocytes Relative: 12 %
Lymphs Abs: 0.5 10*3/uL — ABNORMAL LOW (ref 0.7–4.0)
MCH: 24.6 pg — ABNORMAL LOW (ref 26.0–34.0)
MCHC: 31.1 g/dL (ref 30.0–36.0)
MCV: 79.2 fL — ABNORMAL LOW (ref 80.0–100.0)
Monocytes Absolute: 0.3 10*3/uL (ref 0.1–1.0)
Monocytes Relative: 7 %
Neutro Abs: 3.5 10*3/uL (ref 1.7–7.7)
Neutrophils Relative %: 81 %
Platelets: 215 10*3/uL (ref 150–400)
RBC: 5.24 MIL/uL (ref 4.22–5.81)
RDW: 15.6 % — ABNORMAL HIGH (ref 11.5–15.5)
WBC: 4.4 10*3/uL (ref 4.0–10.5)
nRBC: 0 % (ref 0.0–0.2)

## 2020-05-24 LAB — COMPREHENSIVE METABOLIC PANEL
ALT: 19 U/L (ref 0–44)
AST: 41 U/L (ref 15–41)
Albumin: 2.7 g/dL — ABNORMAL LOW (ref 3.5–5.0)
Alkaline Phosphatase: 54 U/L (ref 38–126)
Anion gap: 12 (ref 5–15)
BUN: 46 mg/dL — ABNORMAL HIGH (ref 8–23)
CO2: 28 mmol/L (ref 22–32)
Calcium: 8.6 mg/dL — ABNORMAL LOW (ref 8.9–10.3)
Chloride: 101 mmol/L (ref 98–111)
Creatinine, Ser: 1.95 mg/dL — ABNORMAL HIGH (ref 0.61–1.24)
GFR calc Af Amer: 36 mL/min — ABNORMAL LOW (ref >60)
GFR calc non Af Amer: 31 mL/min — ABNORMAL LOW (ref >60)
Glucose, Bld: 106 mg/dL — ABNORMAL HIGH (ref 70–99)
Potassium: 3.7 mmol/L (ref 3.5–5.1)
Sodium: 141 mmol/L (ref 135–145)
Total Bilirubin: 0.5 mg/dL (ref 0.3–1.2)
Total Protein: 6.6 g/dL (ref 6.5–8.1)

## 2020-05-24 LAB — D-DIMER, QUANTITATIVE: D-Dimer, Quant: 2.53 ug/mL-FEU — ABNORMAL HIGH (ref 0.00–0.50)

## 2020-05-24 LAB — GLUCOSE, CAPILLARY: Glucose-Capillary: 119 mg/dL — ABNORMAL HIGH (ref 70–99)

## 2020-05-24 LAB — C-REACTIVE PROTEIN: CRP: 5.1 mg/dL — ABNORMAL HIGH (ref <1.0)

## 2020-05-24 MED ORDER — ENOXAPARIN SODIUM 30 MG/0.3ML ~~LOC~~ SOLN
30.0000 mg | SUBCUTANEOUS | Status: DC
  Administered 2020-05-25 – 2020-05-27 (×3): 30 mg via SUBCUTANEOUS
  Filled 2020-05-24 (×3): qty 0.3

## 2020-05-24 MED ORDER — DONEPEZIL HCL 10 MG PO TABS
10.0000 mg | ORAL_TABLET | Freq: Every day | ORAL | Status: DC
  Administered 2020-05-24 – 2020-05-26 (×3): 10 mg via ORAL
  Filled 2020-05-24 (×3): qty 1

## 2020-05-24 NOTE — Progress Notes (Signed)
Scr up to 1.95 today>>CrCl less 30 ml/min now. Ok to adjust lovenox to 30mg  qday per Lang Snow, NP.  Onnie Boer, PharmD, BCIDP, AAHIVP, CPP Infectious Disease Pharmacist 05/24/2020 8:21 PM

## 2020-05-24 NOTE — Progress Notes (Signed)
PROGRESS NOTE  Matthew Gray BHA:[id number] DOB: 01/25/1934 DOA: 05/21/2020 PCP: Biagio Borg, MD   LOS: 2 days   Brief Narrative / Interim history: Matthew Gray is an 86 y.o. male past medical history significant for dementia, COPD CAD comes in for 3 days of generalized weakness fatigue and altered mental status, chest x-ray showing bilateral infiltrate, SARS-CoV-2 was positive.  Subjective / 24h Interval events: Still coughing but denies any shortness of breath.  He is on 3 L oxygen this morning  Assessment & Plan:  Principal Problem Covid-19 Viral Illness /pneumonia, possible acute hypoxic respiratory failure -Chest x-ray on admission showed developing multifocal pneumonia, patient without significant shortness of breath however has been having respiratory symptoms with a cough which has been productive -Start remdesivir, steroids, for some reason he is on oxygen this morning satting well.  Will attempt to wean off.  Not sure I see documented hypoxia -Patient has received monoclonal antibodies as well -Continue to monitor inflammatory markers   COVID-19 Labs  Recent Labs    05/22/20 0240 05/22/20 0547 05/23/20 0847  DDIMER  --  3.14* 3.66*  FERRITIN  --  491*  --   LDH 176  --   --   CRP  --  7.0* 5.9*    Lab Results  Component Value Date   SARSCOV2NAA POSITIVE (A) 05/21/2020   Active Problems COPD-stable, no wheezing, continue inhalers  Advanced dementia-continue home regimen  Essential hypertension-continue Norvasc  Pressure injury, POA Pressure Injury 05/22/20 Coccyx Stage 2 -  Partial thickness loss of dermis presenting as a shallow open injury with a red, pink wound bed without slough. (Active)  05/22/20 1622  Location: Coccyx  Location Orientation:   Staging: Stage 2 -  Partial thickness loss of dermis presenting as a shallow open injury with a red, pink wound bed without slough.  Wound Description (Comments):   Present on Admission: Yes   Scheduled  Meds: . AeroChamber Plus Flo-Vu Large  1 each Other Once  . albuterol  2 puff Inhalation Q6H  . amLODipine  5 mg Oral Daily  . aspirin EC  81 mg Oral Daily  . dexamethasone  6 mg Oral Daily  . donepezil  10 mg Oral QHS  . enoxaparin (LOVENOX) injection  40 mg Subcutaneous Q24H  . QUEtiapine  25 mg Oral QHS   Continuous Infusions: . sodium chloride    . remdesivir 100 mg in NS 100 mL 100 mg (05/24/20 1041)   PRN Meds:.sodium chloride, acetaminophen, albuterol, chlorpheniramine-HYDROcodone, EPINEPHrine, guaiFENesin-dextromethorphan, haloperidol lactate, methylPREDNISolone (SOLU-MEDROL) injection, ondansetron **OR** ondansetron (ZOFRAN) IV, oxyCODONE  DVT prophylaxis: Lovenox Code Status: Full code Family Communication: will call daughter in the afternoon   Status is: Inpatient  Remains inpatient appropriate because:IV treatments appropriate due to intensity of illness or inability to take PO   Dispo: The patient is from: Home              Anticipated d/c is to: Home              Anticipated d/c date is: 3 days              Patient currently is not medically stable to d/c.  Consultants:  None   Procedures:  None   Microbiology: None   Antibacterials: None    Objective: Vitals:   05/23/20 2100 05/24/20 0652 05/24/20 0700 05/24/20 0830  BP: (!) 131/95 113/83 119/83 117/87  Pulse: 62 73 86   Resp: 18 (!) 23 (!) 32 Marland Kitchen)  22  Temp: (!) 97.3 F (36.3 C) 98.2 F (36.8 C)  97.6 F (36.4 C)  TempSrc: Axillary Oral  Tympanic  SpO2: 99% 100% 100% 100%  Weight:      Height:        Intake/Output Summary (Last 24 hours) at 05/24/2020 1141 Last data filed at 05/24/2020 0948 Gross per 24 hour  Intake 730 ml  Output --  Net 730 ml   Filed Weights   05/21/20 1609  Weight: 68 kg    Examination:  Constitutional: No distress Eyes: No icterus ENMT: mmm Neck: normal, supple Respiratory: Faint bibasilar rhonchi, no wheezing, moves air well Cardiovascular: Regular rate  and rhythm, no murmurs Abdomen: Soft, nontender, nondistended, bowel sounds positive Musculoskeletal: no clubbing / cyanosis.  Skin: No rashes appreciated Neurologic: Nonfocal, equal strength  Data Reviewed: I have independently reviewed following labs and imaging studies   CBC: Recent Labs  Lab 05/21/20 1612 05/22/20 0547 05/23/20 0847  WBC 5.5 3.9* 4.7  NEUTROABS 4.5 2.6 3.2  HGB 12.8* 12.9* 14.3  HCT 42.1 42.1 46.2  MCV 81.3 82.5 81.2  PLT 222 202 024   Basic Metabolic Panel: Recent Labs  Lab 05/21/20 1612 05/22/20 0547 05/23/20 0847  NA 142 141 140  K 4.3 4.3 3.9  CL 102 101 101  CO2 29 30 20*  GLUCOSE 97 83 68*  BUN 25* 25* 22  CREATININE 1.37* 1.17 1.09  CALCIUM 9.3 9.0 8.8*   GFR: Estimated Creatinine Clearance: 49.4 mL/min (by C-G formula based on SCr of 1.09 mg/dL). Liver Function Tests: Recent Labs  Lab 05/21/20 1612 05/22/20 0547 05/23/20 0847  AST 20 35 41  ALT 14 13 19   ALKPHOS 67 60 65  BILITOT 0.6 1.3* 0.7  PROT 7.4 7.2 7.5  ALBUMIN 3.2* 3.0* 3.1*   No results for input(s): LIPASE, AMYLASE in the last 168 hours. No results for input(s): AMMONIA in the last 168 hours. Coagulation Profile: Recent Labs  Lab 05/21/20 1612  INR 1.1   Cardiac Enzymes: No results for input(s): CKTOTAL, CKMB, CKMBINDEX, TROPONINI in the last 168 hours. BNP (last 3 results) No results for input(s): PROBNP in the last 8760 hours. HbA1C: No results for input(s): HGBA1C in the last 72 hours. CBG: No results for input(s): GLUCAP in the last 168 hours. Lipid Profile: Recent Labs    05/22/20 0240  TRIG 48   Thyroid Function Tests: No results for input(s): TSH, T4TOTAL, FREET4, T3FREE, THYROIDAB in the last 72 hours. Anemia Panel: Recent Labs    05/22/20 0547  FERRITIN 491*   Urine analysis:    Component Value Date/Time   COLORURINE YELLOW 05/22/2020 0400   APPEARANCEUR HAZY (A) 05/22/2020 0400   LABSPEC 1.011 05/22/2020 0400   PHURINE 7.0  05/22/2020 0400   GLUCOSEU NEGATIVE 05/22/2020 0400   GLUCOSEU NEGATIVE 08/18/2019 1148   HGBUR LARGE (A) 05/22/2020 0400   BILIRUBINUR NEGATIVE 05/22/2020 0400   BILIRUBINUR neg 08/04/2019 1643   KETONESUR NEGATIVE 05/22/2020 0400   PROTEINUR 100 (A) 05/22/2020 0400   UROBILINOGEN 0.2 08/18/2019 1148   NITRITE NEGATIVE 05/22/2020 0400   LEUKOCYTESUR TRACE (A) 05/22/2020 0400   Sepsis Labs: Invalid input(s): PROCALCITONIN, LACTICIDVEN  Recent Results (from the past 240 hour(s))  Respiratory Panel by RT PCR (Flu A&B, Covid) - Nasopharyngeal Swab     Status: Abnormal   Collection Time: 05/21/20  4:14 PM   Specimen: Nasopharyngeal Swab  Result Value Ref Range Status   SARS Coronavirus 2 by RT PCR POSITIVE (  A) NEGATIVE Final    Comment: RESULT CALLED TO, READ BACK BY AND VERIFIED WITH: K PATE RN 05/21/20 AT 1745 SK (NOTE) SARS-CoV-2 target nucleic acids are DETECTED.  SARS-CoV-2 RNA is generally detectable in upper respiratory specimens  during the acute phase of infection. Positive results are indicative of the presence of the identified virus, but do not rule out bacterial infection or co-infection with other pathogens not detected by the test. Clinical correlation with patient history and other diagnostic information is necessary to determine patient infection status. The expected result is Negative.  Fact Sheet for Patients:  PinkCheek.be  Fact Sheet for Healthcare Providers: GravelBags.it  This test is not yet approved or cleared by the [place] FDA and  has been authorized for detection and/or diagnosis of SARS-CoV-2 by FDA under an Emergency Use Authorization (EUA).  This EUA will remain in effect (meaning this test can be used)  for the duration of  the COVID-19 declaration under Section 564(b)(1) of the Act, 21 U.S.C. section 360bbb-3(b)(1), unless the authorization is terminated or revoked sooner.       Influenza A by PCR NEGATIVE NEGATIVE Final   Influenza B by PCR NEGATIVE NEGATIVE Final    Comment: (NOTE) The Xpert Xpress SARS-CoV-2/FLU/RSV assay is intended as an aid in  the diagnosis of influenza from Nasopharyngeal swab specimens and  should not be used as a sole basis for treatment. Nasal washings and  aspirates are unacceptable for Xpert Xpress SARS-CoV-2/FLU/RSV  testing.  Fact Sheet for Patients: PinkCheek.be  Fact Sheet for Healthcare Providers: GravelBags.it  This test is not yet approved or cleared by the [place] FDA and  has been authorized for detection and/or diagnosis of SARS-CoV-2 by  FDA under an Emergency Use Authorization (EUA). This EUA will remain  in effect (meaning this test can be used) for the duration of the  Covid-19 declaration under Section 564(b)(1) of the Act, 21  U.S.C. section 360bbb-3(b)(1), unless the authorization is  terminated or revoked. Performed at [facility], [place] [address], [place], [place] [address]   Culture, blood (Routine x 2)     Status: None (Preliminary result)   Collection Time: 05/21/20  4:54 PM   Specimen: BLOOD  Result Value Ref Range Status   Specimen Description BLOOD RIGHT ANTECUBITAL  Final   Special Requests   Final    BOTTLES DRAWN AEROBIC AND ANAEROBIC Blood Culture adequate volume   Culture   Final    NO GROWTH 3 DAYS Performed at [facility], [place] [address] [place], [place] [address]    Report Status PENDING  Incomplete  Culture, blood (Routine x 2)     Status: None (Preliminary result)   Collection Time: 05/21/20 10:45 PM   Specimen: BLOOD  Result Value Ref Range Status   Specimen Description BLOOD LEFT ANTECUBITAL  Final   Special Requests   Final    BOTTLES DRAWN AEROBIC ONLY Blood Culture results may not be optimal due to an inadequate volume of blood received in culture bottles   Culture   Final    NO GROWTH 3  DAYS Performed at [facility], [place] [address] [place], [place] [address]    Report Status PENDING  Incomplete      Radiology Studies: No results found.  Marzetta Board, MD, PhD [facility]  Between 7 am - 7 pm I am available, please contact me via Amion or Securechat  Between 7 pm - 7 am I am  not available, please contact night coverage MD/APP via Amion

## 2020-05-24 NOTE — TOC Initial Note (Addendum)
Transition of Care [facility]) - Initial/Assessment Note    Patient Details  Name: Matthew Gray MRN: [id number] Date of Birth: [birth year]  Transition of Care [facility]) CM/SW Contact:    Curlene Labrum, RN Phone Number: 05/24/2020, 1:28 PM  Clinical Narrative:                 Case management called and left a message with [name], daughter to assess for home services.  The patient lives with daughter, [name] at home and attends an [place] during the day 5 days week/ 8 hours a day - [employer].  The patient is currently unvaccinated and is admitted for weakness, fatigue, altered mental status and COVID +.  Patient is currently on O2 in the hospital with continued medical work up.  9/30- I spoke with the patient's daughter, [name], and she is currently sick at home with COVID infection at this time, but states that she is plans to take the patient home to care for him after discharge from the hospital.  The patient does not use dme at this time and is not receiving home health services.  The patient's daughter is open to home health services if he is in need of PT prior to discharge from the hospital.  Will continue to follow pt for discharge needs and stay in touch with the patient's daughter.  Expected Discharge Plan: [place] Barriers to Discharge: Continued Medical Work up   Patient Goals and CMS Choice Patient states their goals for this hospitalization and ongoing recovery are:: Left message with daughter, [name], caregiver      Expected Discharge Plan and Services Expected Discharge Plan: [place]   Discharge Planning Services: CM Consult   Living arrangements for the past 2 months: Single Family Home                                      Prior Living Arrangements/Services Living arrangements for the past 2 months: Single Family Home Lives with:: Adult Children Patient language and need for  interpreter reviewed:: Yes Do you feel safe going back to the place where you live?: Yes      Need for Family Participation in Patient Care: Yes (Comment) Care giver support system in place?: Yes (comment)   Criminal Activity/Legal Involvement Pertinent to Current Situation/Hospitalization: No - Comment as needed  Activities of Daily Living      Permission Sought/Granted Permission sought to share information with : Case Manager          Permission granted to share info w Relationship: daughter [name]     Emotional Assessment         Alcohol / Substance Use: Not Applicable Psych Involvement: No (comment)  Admission diagnosis:  Encephalopathy acute [U27.25] Acute metabolic encephalopathy [D66.44] COVID-19 [U07.1] Patient Active Problem List   Diagnosis Date Noted  . Pressure injury of skin 05/24/2020  . Pneumonia due to COVID-19 virus 05/22/2020  . Acute metabolic encephalopathy 03/47/4259  . Encephalopathy due to COVID-19 virus 05/22/2020  . Microhematuria 03/17/2020  . Acute bilateral low back pain without sciatica 03/05/2020  . Constipation 03/05/2020  . Weight loss, abnormal 03/05/2020  . Closed wedge compression fracture of third lumbar vertebra ([place]) 03/05/2020  . Pneumonia of right middle lobe due to infectious organism 03/05/2020  . Abnormal findings on diagnostic imaging of lung 10/06/2019  .  Urinary frequency 08/04/2019  . Community acquired pneumonia 11/02/2018  . Pneumonia 11/02/2018  . COPD exacerbation ([place]) 10/14/2018  . Influenza-like illness 10/04/2018  . Urinary incontinence 07/19/2018  . Fatigue 10/22/2017  . Hypokalemia 07/29/2017  . Driving safety issue 04/21/2017  . Acute right ankle pain 08/16/2016  . Bilateral hand pain 03/30/2016  . Bilateral hip pain 03/27/2016  . Loose stools 03/27/2016  . Low back pain 11/13/2015  . Cough 02/06/2015  . Weakness 07/19/2014  . Right inguinal hernia 03/04/2013  . Dementia, Alzheimer's, with  behavior disturbance ([place]) 01/13/2013  . Obstructive chronic bronchitis without exacerbation ([place])   . Polyneuropathy in other diseases classified elsewhere ([place])   . PVD (peripheral vascular disease) ([place]) 05/04/2012  . Peripheral neuropathy 03/08/2012  . Bilateral leg pain 03/08/2012  . Peripheral edema 09/10/2011  . Encounter for well adult exam with abnormal findings 11/22/2010  . Weight loss 11/22/2010  . Anxiety 11/22/2010  . PARESTHESIA 09/02/2010  . Vitamin D deficiency 06/04/2010  . Depression 11/30/2009  . ANKLE PAIN, RIGHT 11/30/2009  . FATIGUE 11/30/2009  . PERIPHERAL EDEMA 11/16/2009  . SCIATICA, LEFT 11/08/2009  . Backache 10/08/2009  . SHOULDER PAIN, LEFT 06/21/2009  . Hemoptysis 04/24/2009  . 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 MYALGIA 02/04/2008  . Pain in Soft Tissues of Limb 02/04/2008  . Coronary atherosclerosis 10/27/2007  . PERIPHERAL VASCULAR DISEASE 10/27/2007  . Allergic rhinitis 10/27/2007  . Asthma 10/27/2007  . COPD (chronic obstructive pulmonary disease) ([place]) 10/27/2007  . BENIGN PROSTATIC HYPERTROPHY 10/27/2007  . RASH-NONVESICULAR 10/27/2007  . COLONIC POLYPS, HX OF 10/27/2007  . Hyperlipidemia 04/03/2007  . Essential hypertension 04/03/2007   PCP:  Biagio Borg, MD Pharmacy:   [facility], [address] [place] [address] Phone: [phone] Fax: [phone]     Social Determinants of Health (SDOH) Interventions    Readmission Risk Interventions No flowsheet data found.

## 2020-05-25 LAB — CBC WITH DIFFERENTIAL/PLATELET
Abs Immature Granulocytes: 0.01 10*3/uL (ref 0.00–0.07)
Basophils Absolute: 0 10*3/uL (ref 0.0–0.1)
Basophils Relative: 0 %
Eosinophils Absolute: 0 10*3/uL (ref 0.0–0.5)
Eosinophils Relative: 0 %
HCT: 41.1 % (ref 39.0–52.0)
Hemoglobin: 12.8 g/dL — ABNORMAL LOW (ref 13.0–17.0)
Immature Granulocytes: 0 %
Lymphocytes Relative: 8 %
Lymphs Abs: 0.3 10*3/uL — ABNORMAL LOW (ref 0.7–4.0)
MCH: 24.6 pg — ABNORMAL LOW (ref 26.0–34.0)
MCHC: 31.1 g/dL (ref 30.0–36.0)
MCV: 78.9 fL — ABNORMAL LOW (ref 80.0–100.0)
Monocytes Absolute: 0.3 10*3/uL (ref 0.1–1.0)
Monocytes Relative: 8 %
Neutro Abs: 3.1 10*3/uL (ref 1.7–7.7)
Neutrophils Relative %: 84 %
Platelets: 191 10*3/uL (ref 150–400)
RBC: 5.21 MIL/uL (ref 4.22–5.81)
RDW: 15.4 % (ref 11.5–15.5)
WBC: 3.7 10*3/uL — ABNORMAL LOW (ref 4.0–10.5)
nRBC: 0 % (ref 0.0–0.2)

## 2020-05-25 LAB — COMPREHENSIVE METABOLIC PANEL
ALT: 19 U/L (ref 0–44)
AST: 37 U/L (ref 15–41)
Albumin: 2.7 g/dL — ABNORMAL LOW (ref 3.5–5.0)
Alkaline Phosphatase: 50 U/L (ref 38–126)
Anion gap: 11 (ref 5–15)
BUN: 53 mg/dL — ABNORMAL HIGH (ref 8–23)
CO2: 26 mmol/L (ref 22–32)
Calcium: 8.7 mg/dL — ABNORMAL LOW (ref 8.9–10.3)
Chloride: 99 mmol/L (ref 98–111)
Creatinine, Ser: 1.86 mg/dL — ABNORMAL HIGH (ref 0.61–1.24)
GFR calc Af Amer: 38 mL/min — ABNORMAL LOW (ref >60)
GFR calc non Af Amer: 33 mL/min — ABNORMAL LOW (ref >60)
Glucose, Bld: 119 mg/dL — ABNORMAL HIGH (ref 70–99)
Potassium: 3.7 mmol/L (ref 3.5–5.1)
Sodium: 136 mmol/L (ref 135–145)
Total Bilirubin: 0.5 mg/dL (ref 0.3–1.2)
Total Protein: 6.5 g/dL (ref 6.5–8.1)

## 2020-05-25 LAB — C-REACTIVE PROTEIN: CRP: 4.2 mg/dL — ABNORMAL HIGH (ref <1.0)

## 2020-05-25 LAB — D-DIMER, QUANTITATIVE: D-Dimer, Quant: 2.31 ug/mL-FEU — ABNORMAL HIGH (ref 0.00–0.50)

## 2020-05-25 MED ORDER — SODIUM CHLORIDE 0.9 % IV BOLUS
1000.0000 mL | Freq: Once | INTRAVENOUS | Status: AC
  Administered 2020-05-25: 1000 mL via INTRAVENOUS

## 2020-05-25 NOTE — Progress Notes (Signed)
PROGRESS NOTE  Matthew Gray XLK:[id number] DOB: 03-27-1934 DOA: 05/21/2020 PCP: Biagio Borg, MD   LOS: 3 days   Brief Narrative / Interim history: Matthew Gray is an 86 y.o. male past medical history significant for dementia, COPD CAD comes in for 3 days of generalized weakness fatigue and altered mental status, chest x-ray showing bilateral infiltrate, SARS-CoV-2 was positive.  Subjective / 24h Interval events: Complains of a cough but otherwise denies shortness of breath.  Assessment & Plan:  Principal Problem Covid-19 Viral Illness /pneumonia, acute hypoxic respiratory failure -Chest x-ray on admission showed developing multifocal pneumonia, patient without significant shortness of breath however has been having respiratory symptoms with a cough which has been productive -Has briefly required oxygen 9/30. -Continue remdesivir, steroids -Patient has received monoclonal antibodies as well -Continue to monitor inflammatory markers   COVID-19 Labs  Recent Labs    05/23/20 0847 05/24/20 1314 05/25/20 0452  DDIMER 3.66* 2.53* 2.31*  CRP 5.9* 5.1* 4.2*    Lab Results  Component Value Date   SARSCOV2NAA POSITIVE (A) 05/21/2020   Active Problems COPD-stable, no wheezing, continue inhalers  Acute kidney injury -possibly due to poor p.o. intake, will fluid challenge  Acute metabolic encephalopathy superimposed on advanced dementia-is metabolic encephalopathy was likely in the setting of Covid illness, now improved and appears to be at baseline  Essential hypertension-continue Norvasc  Pressure injury, POA Pressure Injury 05/22/20 Coccyx Stage 2 -  Partial thickness loss of dermis presenting as a shallow open injury with a red, pink wound bed without slough. (Active)  05/22/20 1622  Location: Coccyx  Location Orientation:   Staging: Stage 2 -  Partial thickness loss of dermis presenting as a shallow open injury with a red, pink wound bed without slough.  Wound Description  (Comments):   Present on Admission: Yes   Scheduled Meds: . AeroChamber Plus Flo-Vu Large  1 each Other Once  . albuterol  2 puff Inhalation Q6H  . amLODipine  5 mg Oral Daily  . aspirin EC  81 mg Oral Daily  . dexamethasone  6 mg Oral Daily  . donepezil  10 mg Oral QHS  . enoxaparin (LOVENOX) injection  30 mg Subcutaneous Q24H  . QUEtiapine  25 mg Oral QHS   Continuous Infusions: . sodium chloride    . remdesivir 100 mg in NS 100 mL 100 mg (05/24/20 1041)  . sodium chloride     PRN Meds:.sodium chloride, acetaminophen, albuterol, chlorpheniramine-HYDROcodone, EPINEPHrine, guaiFENesin-dextromethorphan, haloperidol lactate, methylPREDNISolone (SOLU-MEDROL) injection, ondansetron **OR** ondansetron (ZOFRAN) IV, oxyCODONE  DVT prophylaxis: Lovenox Code Status: Full code Family Communication: d/w daughter over the phone   Status is: Inpatient  Remains inpatient appropriate because:IV treatments appropriate due to intensity of illness or inability to take PO   Dispo: The patient is from: Home              Anticipated d/c is to: Home              Anticipated d/c date is: 3 days              Patient currently is not medically stable to d/c.  Consultants:  None   Procedures:  None   Microbiology: None   Antibacterials: None    Objective: Vitals:   05/24/20 1518 05/24/20 1900 05/25/20 0400 05/25/20 0700  BP:  (!) 136/96 (!) 129/94   Pulse:  75 73   Resp:  20 13   Temp: 98.2 F (36.8 C) 98.4 F (36.9 C) 98.1  F (36.7 C) 97.6 F (36.4 C)  TempSrc: Oral Oral Oral Oral  SpO2: 100% 100% 96%   Weight:      Height:        Intake/Output Summary (Last 24 hours) at 05/25/2020 1018 Last data filed at 05/25/2020 0600 Gross per 24 hour  Intake 580 ml  Output --  Net 580 ml   Filed Weights   05/21/20 1609  Weight: 68 kg    Examination:  Constitutional: NAD Eyes: no icterus  ENMT: mmm Neck: normal, supple Respiratory: bibasilar rhonchi clearing after coughing,  no wheezing Cardiovascular: Regular rate and rhythm, no murmurs appreciated, no edema Abdomen: Soft, nontender, nondistended, bowel sounds positive Musculoskeletal: no clubbing / cyanosis.  Skin: No rashes appreciated Neurologic: No focal deficits, equal strength  Data Reviewed: I have independently reviewed following labs and imaging studies   CBC: Recent Labs  Lab 05/21/20 1612 05/22/20 0547 05/23/20 0847 05/24/20 1314 05/25/20 0452  WBC 5.5 3.9* 4.7 4.4 3.7*  NEUTROABS 4.5 2.6 3.2 3.5 3.1  HGB 12.8* 12.9* 14.3 12.9* 12.8*  HCT 42.1 42.1 46.2 41.5 41.1  MCV 81.3 82.5 81.2 79.2* 78.9*  PLT 222 202 255 215 263   Basic Metabolic Panel: Recent Labs  Lab 05/21/20 1612 05/22/20 0547 05/23/20 0847 05/24/20 1314 05/25/20 0452  NA 142 141 140 141 136  K 4.3 4.3 3.9 3.7 3.7  CL 102 101 101 101 99  CO2 29 30 20* 28 26  GLUCOSE 97 83 68* 106* 119*  BUN 25* 25* 22 46* 53*  CREATININE 1.37* 1.17 1.09 1.95* 1.86*  CALCIUM 9.3 9.0 8.8* 8.6* 8.7*   GFR: Estimated Creatinine Clearance: 28.9 mL/min (A) (by C-G formula based on SCr of 1.86 mg/dL (H)). Liver Function Tests: Recent Labs  Lab 05/21/20 1612 05/22/20 0547 05/23/20 0847 05/24/20 1314 05/25/20 0452  AST 20 35 41 41 37  ALT 14 13 19 19 19   ALKPHOS 67 60 65 54 50  BILITOT 0.6 1.3* 0.7 0.5 0.5  PROT 7.4 7.2 7.5 6.6 6.5  ALBUMIN 3.2* 3.0* 3.1* 2.7* 2.7*   No results for input(s): LIPASE, AMYLASE in the last 168 hours. No results for input(s): AMMONIA in the last 168 hours. Coagulation Profile: Recent Labs  Lab 05/21/20 1612  INR 1.1   Cardiac Enzymes: No results for input(s): CKTOTAL, CKMB, CKMBINDEX, TROPONINI in the last 168 hours. BNP (last 3 results) No results for input(s): PROBNP in the last 8760 hours. HbA1C: No results for input(s): HGBA1C in the last 72 hours. CBG: Recent Labs  Lab 05/24/20 1214  GLUCAP 119*   Lipid Profile: No results for input(s): CHOL, HDL, LDLCALC, TRIG, CHOLHDL,  LDLDIRECT in the last 72 hours. Thyroid Function Tests: No results for input(s): TSH, T4TOTAL, FREET4, T3FREE, THYROIDAB in the last 72 hours. Anemia Panel: No results for input(s): VITAMINB12, FOLATE, FERRITIN, TIBC, IRON, RETICCTPCT in the last 72 hours. Urine analysis:    Component Value Date/Time   COLORURINE YELLOW 05/22/2020 0400   APPEARANCEUR HAZY (A) 05/22/2020 0400   LABSPEC 1.011 05/22/2020 0400   PHURINE 7.0 05/22/2020 0400   GLUCOSEU NEGATIVE 05/22/2020 0400   GLUCOSEU NEGATIVE 08/18/2019 1148   HGBUR LARGE (A) 05/22/2020 0400   BILIRUBINUR NEGATIVE 05/22/2020 0400   BILIRUBINUR neg 08/04/2019 1643   KETONESUR NEGATIVE 05/22/2020 0400   PROTEINUR 100 (A) 05/22/2020 0400   UROBILINOGEN 0.2 08/18/2019 1148   NITRITE NEGATIVE 05/22/2020 0400   LEUKOCYTESUR TRACE (A) 05/22/2020 0400   Sepsis Labs: Invalid input(s): PROCALCITONIN,  LACTICIDVEN  Recent Results (from the past 240 hour(s))  Respiratory Panel by RT PCR (Flu A&B, Covid) - Nasopharyngeal Swab     Status: Abnormal   Collection Time: 05/21/20  4:14 PM   Specimen: Nasopharyngeal Swab  Result Value Ref Range Status   SARS Coronavirus 2 by RT PCR POSITIVE (A) NEGATIVE Final    Comment: RESULT CALLED TO, READ BACK BY AND VERIFIED WITH: K PATE RN 05/21/20 AT 1745 SK (NOTE) SARS-CoV-2 target nucleic acids are DETECTED.  SARS-CoV-2 RNA is generally detectable in upper respiratory specimens  during the acute phase of infection. Positive results are indicative of the presence of the identified virus, but do not rule out bacterial infection or co-infection with other pathogens not detected by the test. Clinical correlation with patient history and other diagnostic information is necessary to determine patient infection status. The expected result is Negative.  Fact Sheet for Patients:  PinkCheek.be  Fact Sheet for Healthcare  Providers: GravelBags.it  This test is not yet approved or cleared by the [place] FDA and  has been authorized for detection and/or diagnosis of SARS-CoV-2 by FDA under an Emergency Use Authorization (EUA).  This EUA will remain in effect (meaning this test can be used)  for the duration of  the COVID-19 declaration under Section 564(b)(1) of the Act, 21 U.S.C. section 360bbb-3(b)(1), unless the authorization is terminated or revoked sooner.      Influenza A by PCR NEGATIVE NEGATIVE Final   Influenza B by PCR NEGATIVE NEGATIVE Final    Comment: (NOTE) The Xpert Xpress SARS-CoV-2/FLU/RSV assay is intended as an aid in  the diagnosis of influenza from Nasopharyngeal swab specimens and  should not be used as a sole basis for treatment. Nasal washings and  aspirates are unacceptable for Xpert Xpress SARS-CoV-2/FLU/RSV  testing.  Fact Sheet for Patients: PinkCheek.be  Fact Sheet for Healthcare Providers: GravelBags.it  This test is not yet approved or cleared by the [place] FDA and  has been authorized for detection and/or diagnosis of SARS-CoV-2 by  FDA under an Emergency Use Authorization (EUA). This EUA will remain  in effect (meaning this test can be used) for the duration of the  Covid-19 declaration under Section 564(b)(1) of the Act, 21  U.S.C. section 360bbb-3(b)(1), unless the authorization is  terminated or revoked. Performed at [facility], [place] [address] [place], [place] [address]   Culture, blood (Routine x 2)     Status: None (Preliminary result)   Collection Time: 05/21/20  4:54 PM   Specimen: BLOOD  Result Value Ref Range Status   Specimen Description BLOOD RIGHT ANTECUBITAL  Final   Special Requests   Final    BOTTLES DRAWN AEROBIC AND ANAEROBIC Blood Culture adequate volume   Culture   Final    NO GROWTH 4 DAYS Performed at [facility], [place] [address] [place], [place] [address]    Report Status PENDING  Incomplete  Culture, blood (Routine x 2)     Status: None (Preliminary result)   Collection Time: 05/21/20 10:45 PM   Specimen: BLOOD  Result Value Ref Range Status   Specimen Description BLOOD LEFT ANTECUBITAL  Final   Special Requests   Final    BOTTLES DRAWN AEROBIC ONLY Blood Culture results may not be optimal due to an inadequate volume of blood received in culture bottles   Culture   Final    NO GROWTH 4 DAYS Performed at [facility], [place]  [address], [place], [place] [address]    Report Status PENDING  Incomplete      Radiology Studies: No results found.  Marzetta Board, MD, PhD [facility]  Between 7 am - 7 pm I am available, please contact me via Amion or Securechat  Between 7 pm - 7 am I am not available, please contact night coverage MD/APP via Amion

## 2020-05-25 NOTE — Evaluation (Signed)
Occupational Therapy Evaluation Patient Details Name: Matthew Gray MRN: [id number] DOB: 04-Jul-1934 Today's Date: 05/25/2020    History of Present Illness Pt is a 86 y.o. M with significant PMH of dementia, COPD, CAD who presents with generalized weakness, fatigue and AMS. Found to be positive for SARS-CoV-2.   Clinical Impression   PTA, pt lives with daughter and attends an adult day center 5 days/week for 8 hours daily. Pt presents with deficits in balance, endurance, cognition. Pt overall Min A for short distance mobility in room and LB ADLs in standing without use of DME. Pt denies using AD at baseline. VSS on RA with pt denying any SOB during activity. Recommend returning home with 24/7 supervision and [facility] therapy follow up.     Follow Up Recommendations  Home health OT;Supervision/Assistance - 24 hour    Equipment Recommendations  None recommended by OT    Recommendations for Other Services       Precautions / Restrictions Precautions Precautions: Fall Restrictions Weight Bearing Restrictions: No      Mobility Bed Mobility Overal bed mobility: Needs Assistance Bed Mobility: Supine to Sit     Supine to sit: Supervision     General bed mobility comments: Supervision for safety, HOB elevated  Transfers Overall transfer level: Needs assistance Equipment used: None Transfers: Sit to/from Stand Sit to Stand: Min guard         General transfer comment: Min guard to rise to stand from edge of bed, trialed RW for mobility but pt with difficulty using. Min A overall to maintain stability for mobility in room    Balance Overall balance assessment: Needs assistance Sitting-balance support: Feet supported Sitting balance-Leahy Scale: Good     Standing balance support: No upper extremity supported;During functional activity Standing balance-Leahy Scale: Poor Standing balance comment: Bracing posteriorly on bed with static standing activity                            ADL either performed or assessed with clinical judgement   ADL Overall ADL's : Needs assistance/impaired Eating/Feeding: Supervision/ safety;Sitting   Grooming: Supervision/safety;Sitting;Wash/dry face Grooming Details (indicate cue type and reason): Supervision to wash face. Once washcloth placed in hand, no cues needed for task Upper Body Bathing: Supervision/ safety;Sitting   Lower Body Bathing: Minimal assistance;Sit to/from stand Lower Body Bathing Details (indicate cue type and reason): Min A for LB bathing in standing, Min A to maintain balance. Pt standing on one foot to wash other foot with sock already donned. Able to perform peri bathing with min cues Upper Body Dressing : Minimal assistance;Sitting   Lower Body Dressing: Minimal assistance;Sit to/from stand Lower Body Dressing Details (indicate cue type and reason): Pt able to don sock sitting EOB without difficulty. May require MIn A for orientation of multiple clothing items Toilet Transfer: Minimal assistance;Ambulation Toilet Transfer Details (indicate cue type and reason): simulated in room without AD and trial of RW (pt with difficulty using) Toileting- Clothing Manipulation and Hygiene: Minimal assistance;Sit to/from stand       Functional mobility during ADLs: Minimal assistance;Cueing for safety;Cueing for sequencing General ADL Comments: Pt with minor balance and safety awareness deficits. hx of dementia at baseline, probably not far off from baseline functioning     Vision Baseline Vision/History: Wears glasses Vision Assessment?: No apparent visual deficits     Perception     Praxis      Pertinent Vitals/Pain Pain Assessment: No/denies pain  Hand Dominance     Extremity/Trunk Assessment Upper Extremity Assessment Upper Extremity Assessment: Overall WFL for tasks assessed   Lower Extremity Assessment Lower Extremity Assessment: Defer to PT evaluation   Cervical / Trunk  Assessment Cervical / Trunk Assessment: Normal   Communication Communication Communication: No difficulties   Cognition Arousal/Alertness: Awake/alert Behavior During Therapy: WFL for tasks assessed/performed Overall Cognitive Status: History of cognitive impairments - at baseline                                 General Comments: History of dementia. Pt oriented to self only, thought he was at home rather than hospital. Follows 1 step commands    General Comments  SpO2 >90% on RA, BP/HR [facility]    Exercises     Shoulder Instructions      Home Living Family/patient expects to be discharged to:: Private residence Living Arrangements: Children (daughter) Available Help at Discharge: Family                             Additional Comments: Pt unable to provide      Prior Functioning/Environment Level of Independence: Needs assistance  Gait / Transfers Assistance Needed: ambulatory without AD     Comments: Attends adult daycare 5 days/wk, 8 hours/day per chart review        OT Problem List: Decreased activity tolerance;Impaired balance (sitting and/or standing);Decreased safety awareness;Decreased cognition      OT Treatment/Interventions: Self-care/ADL training;Therapeutic exercise;Energy conservation;DME and/or AE instruction;Therapeutic activities;Patient/family education    OT Goals(Current goals can be found in the care plan section) Acute Rehab OT Goals Patient Stated Goal: did not state OT Goal Formulation: With patient Time For Goal Achievement: 06/08/20 Potential to Achieve Goals: Fair ADL Goals Pt Will Perform Grooming: with supervision;standing Pt Will Perform Lower Body Bathing: with supervision;sit to/from stand Pt Will Transfer to Toilet: with supervision;ambulating;regular height toilet  OT Frequency: Min 2X/week   Barriers to D/C:            Co-evaluation PT/OT/SLP Co-Evaluation/Treatment: Yes Reason for Co-Treatment:  Necessary to address cognition/behavior during functional activity;To address functional/ADL transfers PT goals addressed during session: Mobility/safety with mobility OT goals addressed during session: ADL's and self-care      AM-PAC OT "6 Clicks" Daily Activity     Outcome Measure Help from another person eating meals?: A Little Help from another person taking care of personal grooming?: A Little Help from another person toileting, which includes using toliet, bedpan, or urinal?: A Little Help from another person bathing (including washing, rinsing, drying)?: A Little Help from another person to put on and taking off regular upper body clothing?: A Little Help from another person to put on and taking off regular lower body clothing?: A Little 6 Click Score: 18   End of Session Equipment Utilized During Treatment: Gait belt Nurse Communication: Mobility status  Activity Tolerance: Patient tolerated treatment well Patient left: in chair;with call bell/phone within reach;with nursing/sitter in room (NT present preparing to assist bathing pt)  OT Visit Diagnosis: Unsteadiness on feet (R26.81);Other abnormalities of gait and mobility (R26.89);Other symptoms and signs involving cognitive function                Time: 1141-1200 OT Time Calculation (min): 19 min Charges:  OT General Charges $OT Visit: 1 Visit OT Evaluation $OT Eval Moderate Complexity: 1 Mod  Layla Maw, OTR/L  Layla Maw 05/25/2020, 1:56 PM

## 2020-05-25 NOTE — Evaluation (Signed)
Physical Therapy Evaluation Patient Details Name: Matthew Gray MRN: [id number] DOB: Jul 18, 1934 Today's Date: 05/25/2020   History of Present Illness  Pt is a 86 y.o. M with significant PMH of dementia, COPD, CAD who presents with generalized weakness, fatigue and AMS. Found to be positive for SARS-CoV-2.  Clinical Impression  Prior to admission, pt lives with his daughter and attends an adult day care 5 days/week, 8 hours/day. On PT evaluation, pt ambulating limited room distances with and without a walker at a min assist level. Demonstrates cognitive impairments and static/dynamic balance deficits. SpO2 > 90% on RA, HR stable. Pt denies dyspnea on exertion. See below for recommendations.    Follow Up Recommendations Home health PT;Supervision/Assistance - 24 hour    Equipment Recommendations  Rolling walker with 5" wheels    Recommendations for Other Services       Precautions / Restrictions Precautions Precautions: Fall Restrictions Weight Bearing Restrictions: No      Mobility  Bed Mobility Overal bed mobility: Needs Assistance Bed Mobility: Supine to Sit     Supine to sit: Supervision     General bed mobility comments: Supervision for safety, HOB elevated  Transfers Overall transfer level: Needs assistance Equipment used: None Transfers: Sit to/from Stand Sit to Stand: Min guard         General transfer comment: Min guard to rise to stand from edge of bed  Ambulation/Gait Ambulation/Gait assistance: Min assist Gait Distance (Feet): 30 Feet Assistive device: Rolling walker (2 wheeled);None Gait Pattern/deviations: Step-through pattern;Decreased stride length;Narrow base of support;Shuffle Gait velocity: decreased Gait velocity interpretation: <1.8 ft/sec, indicate of risk for recurrent falls General Gait Details: Slow pace, shuffle type gait pattern, minA for balance. Pt reaching for external support of IV pole, so provided walker. However, pt with difficulty  negotiating with turns/obstacles.  Stairs            Wheelchair Mobility    Modified Rankin (Stroke Patients Only)       Balance Overall balance assessment: Needs assistance Sitting-balance support: Feet supported Sitting balance-Leahy Scale: Good     Standing balance support: No upper extremity supported;During functional activity Standing balance-Leahy Scale: Poor Standing balance comment: Bracing posteriorly on bed with static standing activity                             Pertinent Vitals/Pain Pain Assessment: No/denies pain    Home Living Family/patient expects to be discharged to:: Private residence Living Arrangements: Children (daughter) Available Help at Discharge: Family             Additional Comments: Pt unable to provide    Prior Function Level of Independence: Needs assistance   Gait / Transfers Assistance Needed: ambulatory without AD     Comments: Attends adult daycare 5 days/wk, 8 hours/day per chart review     Hand Dominance        Extremity/Trunk Assessment   Upper Extremity Assessment Upper Extremity Assessment: Defer to OT evaluation    Lower Extremity Assessment Lower Extremity Assessment: Overall WFL for tasks assessed       Communication   Communication: No difficulties  Cognition Arousal/Alertness: Awake/alert Behavior During Therapy: WFL for tasks assessed/performed Overall Cognitive Status: History of cognitive impairments - at baseline                                 General Comments: History of  dementia. Pt oriented to self only. Follows 1 step commands       General Comments      Exercises     Assessment/Plan    PT Assessment Patient needs continued PT services  PT Problem List Decreased strength;Decreased activity tolerance;Decreased balance;Decreased mobility;Decreased cognition;Decreased safety awareness       PT Treatment Interventions DME instruction;Gait  training;Therapeutic activities;Therapeutic exercise;Functional mobility training;Stair training;Balance training;Patient/family education    PT Goals (Current goals can be found in the Care Plan section)  Acute Rehab PT Goals Patient Stated Goal: did not state PT Goal Formulation: Patient unable to participate in goal setting Time For Goal Achievement: 06/08/20 Potential to Achieve Goals: Good    Frequency Min 3X/week   Barriers to discharge        Co-evaluation PT/OT/SLP Co-Evaluation/Treatment: Yes Reason for Co-Treatment: Necessary to address cognition/behavior during functional activity;To address functional/ADL transfers PT goals addressed during session: Mobility/safety with mobility         AM-PAC PT "6 Clicks" Mobility  Outcome Measure Help needed turning from your back to your side while in a flat bed without using bedrails?: None Help needed moving from lying on your back to sitting on the side of a flat bed without using bedrails?: None Help needed moving to and from a bed to a chair (including a wheelchair)?: A Little Help needed standing up from a chair using your arms (e.g., wheelchair or bedside chair)?: A Little Help needed to walk in hospital room?: A Little Help needed climbing 3-5 steps with a railing? : A Lot 6 Click Score: 19    End of Session Equipment Utilized During Treatment: Gait belt Activity Tolerance: Patient tolerated treatment well Patient left: in chair;with call bell/phone within reach;with chair alarm set;with nursing/sitter in room Nurse Communication: Mobility status PT Visit Diagnosis: Unsteadiness on feet (R26.81);Difficulty in walking, not elsewhere classified (R26.2)    Time: 9242-6834 PT Time Calculation (min) (ACUTE ONLY): 30 min   Charges:   PT Evaluation $PT Eval Moderate Complexity: 1 Mod          Wyona Almas, PT, DPT Acute Rehabilitation Services Pager [phone] Office [phone]   Deno Etienne 05/25/2020, 1:24 PM

## 2020-05-26 LAB — C-REACTIVE PROTEIN: CRP: 4.1 mg/dL — ABNORMAL HIGH (ref <1.0)

## 2020-05-26 LAB — CBC WITH DIFFERENTIAL/PLATELET
Abs Immature Granulocytes: 0.02 10*3/uL (ref 0.00–0.07)
Basophils Absolute: 0 10*3/uL (ref 0.0–0.1)
Basophils Relative: 0 %
Eosinophils Absolute: 0 10*3/uL (ref 0.0–0.5)
Eosinophils Relative: 0 %
HCT: 39.4 % (ref 39.0–52.0)
Hemoglobin: 12.2 g/dL — ABNORMAL LOW (ref 13.0–17.0)
Immature Granulocytes: 0 %
Lymphocytes Relative: 8 %
Lymphs Abs: 0.6 10*3/uL — ABNORMAL LOW (ref 0.7–4.0)
MCH: 24.5 pg — ABNORMAL LOW (ref 26.0–34.0)
MCHC: 31 g/dL (ref 30.0–36.0)
MCV: 79.1 fL — ABNORMAL LOW (ref 80.0–100.0)
Monocytes Absolute: 0.5 10*3/uL (ref 0.1–1.0)
Monocytes Relative: 7 %
Neutro Abs: 6 10*3/uL (ref 1.7–7.7)
Neutrophils Relative %: 85 %
Platelets: 181 10*3/uL (ref 150–400)
RBC: 4.98 MIL/uL (ref 4.22–5.81)
RDW: 15.3 % (ref 11.5–15.5)
WBC: 7 10*3/uL (ref 4.0–10.5)
nRBC: 0 % (ref 0.0–0.2)

## 2020-05-26 LAB — COMPREHENSIVE METABOLIC PANEL
ALT: 27 U/L (ref 0–44)
AST: 42 U/L — ABNORMAL HIGH (ref 15–41)
Albumin: 2.6 g/dL — ABNORMAL LOW (ref 3.5–5.0)
Alkaline Phosphatase: 53 U/L (ref 38–126)
Anion gap: 11 (ref 5–15)
BUN: 49 mg/dL — ABNORMAL HIGH (ref 8–23)
CO2: 27 mmol/L (ref 22–32)
Calcium: 8.7 mg/dL — ABNORMAL LOW (ref 8.9–10.3)
Chloride: 104 mmol/L (ref 98–111)
Creatinine, Ser: 1.37 mg/dL — ABNORMAL HIGH (ref 0.61–1.24)
GFR calc Af Amer: 55 mL/min — ABNORMAL LOW (ref >60)
GFR calc non Af Amer: 47 mL/min — ABNORMAL LOW (ref >60)
Glucose, Bld: 95 mg/dL (ref 70–99)
Potassium: 4.2 mmol/L (ref 3.5–5.1)
Sodium: 142 mmol/L (ref 135–145)
Total Bilirubin: 0.7 mg/dL (ref 0.3–1.2)
Total Protein: 6.2 g/dL — ABNORMAL LOW (ref 6.5–8.1)

## 2020-05-26 LAB — CULTURE, BLOOD (ROUTINE X 2)
Culture: NO GROWTH
Culture: NO GROWTH
Special Requests: ADEQUATE

## 2020-05-26 LAB — D-DIMER, QUANTITATIVE: D-Dimer, Quant: 1.68 ug/mL-FEU — ABNORMAL HIGH (ref 0.00–0.50)

## 2020-05-26 MED ORDER — HALOPERIDOL LACTATE 5 MG/ML IJ SOLN: 2.0000 mg | Freq: Four times a day (QID) | INTRAMUSCULAR | Status: DC | PRN

## 2020-05-26 MED ORDER — QUETIAPINE FUMARATE 25 MG PO TABS
25.0000 mg | ORAL_TABLET | Freq: Two times a day (BID) | ORAL | Status: DC
  Administered 2020-05-26 – 2020-05-27 (×2): 25 mg via ORAL
  Filled 2020-05-26 (×2): qty 1

## 2020-05-26 NOTE — Progress Notes (Signed)
Pt daughter Elenora Gamma called in to get update on her father. Medications, current confusion, and any questions she had were discussed. The patients daughter was concerned about the AMS diagnosis so I paged the MD to call her and go over it.

## 2020-05-26 NOTE — Progress Notes (Signed)
Patient scheduled for outpatient Remdesivir infusion at 12:30 on Sunday 10/3 at [facility]. Please inform the patient to park at [place], as staff will be escorting the patient through the [place] entrance of the hospital. Appointments take approximately 45 minutes.    There is a wave flag banner located near the entrance on [address]. Turn into this entrance and immediately turn left and park in 1 of the 5 designated Covid Infusion Parking spots. There is a phone number on the sign, please call and let the staff know what spot you are in and we will come out and get you. For questions call [phone].  Thanks.

## 2020-05-26 NOTE — Progress Notes (Signed)
The patient daughter is requesting duoneb nebulizer treatments 2 times a day

## 2020-05-26 NOTE — Progress Notes (Signed)
Pt family member called to discuss discharge. The family member was notified that there will be one more dose of IV remdesivir to be administered tomorrow outpatient and that she needed to arrange transportation for the patient to receive this infusion.

## 2020-05-26 NOTE — Progress Notes (Signed)
The patient family member Jeremaih Klima called to discuss discharge, medication schedule, and hygeine again. I explained I gave Amlodipine (Norvasc), Aspirin, Dexamethasone (Decadron), Enoxaparin (lovenox), remdesivir and quetiapine on my shift today. Ms Bauer was concerned about the Quetiapine being only given one time a day for the past few days. The MD previously changed the order to quetiapine 25 mg 2 times a day beginning tonight at 2200. Donepezil was discussed with [place]; it was given last night and has not been administered on day shift today. Ms. Ciavarella would like [place] to stop giving the patient dairy products and keep the patient as clean as possible. Lastly, tonight's medications and tomorrow medications were discussed. The understanding is, tonight's home medications should be quetiapine 25 mg at bedtime and donepezil 10 mg at bedtime and tomorrow morning, no donepezil scheduled and quetiapine 25 mg is scheduled for tomorrow morning. This is what is currently ordered in the [facility] and this was explained to Ms. Kiker.

## 2020-05-26 NOTE — Progress Notes (Signed)
PROGRESS NOTE  Matthew Gray KDT:[id number] DOB: Nov 07, 1933 DOA: 05/21/2020 PCP: Biagio Borg, MD   LOS: 4 days   Brief Narrative / Interim history: Matthew Gray is an 86 y.o. male past medical history significant for dementia, COPD CAD comes in for 3 days of generalized weakness fatigue and altered mental status, chest x-ray showing bilateral infiltrate, SARS-CoV-2 was positive.  Subjective / 24h Interval events: Complains of a cough but otherwise denies shortness of breath.  Assessment & Plan:  Principal Problem Covid-19 Viral Illness /pneumonia, acute hypoxic respiratory failure -Chest x-ray on admission showed developing multifocal pneumonia, patient without significant shortness of breath however has been having respiratory symptoms with a cough which has been productive -Has briefly required oxygen 9/30. -Continue remdesivir, steroids -Patient has received monoclonal antibodies as well -Continue to monitor inflammatory markers, overall improving   COVID-19 Labs  Recent Labs    05/24/20 1314 05/25/20 0452 05/26/20 0733  DDIMER 2.53* 2.31* 1.68*  CRP 5.1* 4.2* 4.1*    Lab Results  Component Value Date   SARSCOV2NAA POSITIVE (A) 05/21/2020   Active Problems COPD-stable, no wheezing, continue inhalers  Acute kidney injury -possibly due to poor p.o. intake, improved after fluid challenge  Acute metabolic encephalopathy superimposed on advanced dementia-is metabolic encephalopathy was likely in the setting of Covid illness, he actually looks a little bit more confused today, speech is different, will obtain MRI  Essential hypertension-continue Norvasc  Pressure injury, POA Pressure Injury 05/22/20 Coccyx Stage 2 -  Partial thickness loss of dermis presenting as a shallow open injury with a red, pink wound bed without slough. (Active)  05/22/20 1622  Location: Coccyx  Location Orientation:   Staging: Stage 2 -  Partial thickness loss of dermis presenting as a shallow  open injury with a red, pink wound bed without slough.  Wound Description (Comments):   Present on Admission: Yes   Scheduled Meds:  AeroChamber Plus Flo-Vu Large  1 each Other Once   albuterol  2 puff Inhalation Q6H   amLODipine  5 mg Oral Daily   aspirin EC  81 mg Oral Daily   dexamethasone  6 mg Oral Daily   donepezil  10 mg Oral QHS   enoxaparin (LOVENOX) injection  30 mg Subcutaneous Q24H   QUEtiapine  25 mg Oral QHS   Continuous Infusions:  sodium chloride     remdesivir 100 mg in NS 100 mL 100 mg (05/26/20 0911)   PRN Meds:.sodium chloride, acetaminophen, albuterol, chlorpheniramine-HYDROcodone, EPINEPHrine, guaiFENesin-dextromethorphan, haloperidol lactate, methylPREDNISolone (SOLU-MEDROL) injection, ondansetron **OR** ondansetron (ZOFRAN) IV, oxyCODONE  DVT prophylaxis: Lovenox Code Status: Full code Family Communication: d/w daughter over the phone   Status is: Inpatient  Remains inpatient appropriate because:IV treatments appropriate due to intensity of illness or inability to take PO   Dispo: The patient is from: Home              Anticipated d/c is to: Home              Anticipated d/c date is: 3 days              Patient currently is not medically stable to d/c.  Consultants:  None   Procedures:  None   Microbiology: None   Antibacterials: None    Objective: Vitals:   05/25/20 2100 05/25/20 2200 05/26/20 0400 05/26/20 0725  BP:  (!) 136/98 139/89 133/86  Pulse: 63 (!) 57 (!) 55 60  Resp: 14 13 12 17   Temp:   (!) 97.2  F (36.2 C) 97.6 F (36.4 C)  TempSrc:   Axillary Oral  SpO2: 100% 100% 100% 100%  Weight:      Height:        Intake/Output Summary (Last 24 hours) at 05/26/2020 1053 Last data filed at 05/26/2020 0900 Gross per 24 hour  Intake 569.25 ml  Output --  Net 569.25 ml   Filed Weights   05/21/20 1609  Weight: 68 kg    Examination:  Constitutional: nad Eyes: no icterus ENMT: mmm Neck: normal,  supple Respiratory: bibasilar rhonchi, no wheezing Cardiovascular: rrr, no mrg, no  Abdomen: Soft, NT, ND, bowel sounds positive Musculoskeletal: no clubbing / cyanosis.  Skin: No rashes Neurologic: Nonfocal  Data Reviewed: I have independently reviewed following labs and imaging studies   CBC: Recent Labs  Lab 05/22/20 0547 05/23/20 0847 05/24/20 1314 05/25/20 0452 05/26/20 0733  WBC 3.9* 4.7 4.4 3.7* 7.0  NEUTROABS 2.6 3.2 3.5 3.1 6.0  HGB 12.9* 14.3 12.9* 12.8* 12.2*  HCT 42.1 46.2 41.5 41.1 39.4  MCV 82.5 81.2 79.2* 78.9* 79.1*  PLT 202 255 215 191 818   Basic Metabolic Panel: Recent Labs  Lab 05/22/20 0547 05/23/20 0847 05/24/20 1314 05/25/20 0452 05/26/20 0733  NA 141 140 141 136 142  K 4.3 3.9 3.7 3.7 4.2  CL 101 101 101 99 104  CO2 30 20* 28 26 27   GLUCOSE 83 68* 106* 119* 95  BUN 25* 22 46* 53* 49*  CREATININE 1.17 1.09 1.95* 1.86* 1.37*  CALCIUM 9.0 8.8* 8.6* 8.7* 8.7*   GFR: Estimated Creatinine Clearance: 39.3 mL/min (A) (by C-G formula based on SCr of 1.37 mg/dL (H)). Liver Function Tests: Recent Labs  Lab 05/22/20 0547 05/23/20 0847 05/24/20 1314 05/25/20 0452 05/26/20 0733  AST 35 41 41 37 42*  ALT 13 19 19 19 27   ALKPHOS 60 65 54 50 53  BILITOT 1.3* 0.7 0.5 0.5 0.7  PROT 7.2 7.5 6.6 6.5 6.2*  ALBUMIN 3.0* 3.1* 2.7* 2.7* 2.6*   No results for input(s): LIPASE, AMYLASE in the last 168 hours. No results for input(s): AMMONIA in the last 168 hours. Coagulation Profile: Recent Labs  Lab 05/21/20 1612  INR 1.1   Cardiac Enzymes: No results for input(s): CKTOTAL, CKMB, CKMBINDEX, TROPONINI in the last 168 hours. BNP (last 3 results) No results for input(s): PROBNP in the last 8760 hours. HbA1C: No results for input(s): HGBA1C in the last 72 hours. CBG: Recent Labs  Lab 05/24/20 1214  GLUCAP 119*   Lipid Profile: No results for input(s): CHOL, HDL, LDLCALC, TRIG, CHOLHDL, LDLDIRECT in the last 72 hours. Thyroid Function  Tests: No results for input(s): TSH, T4TOTAL, FREET4, T3FREE, THYROIDAB in the last 72 hours. Anemia Panel: No results for input(s): VITAMINB12, FOLATE, FERRITIN, TIBC, IRON, RETICCTPCT in the last 72 hours. Urine analysis:    Component Value Date/Time   COLORURINE YELLOW 05/22/2020 0400   APPEARANCEUR HAZY (A) 05/22/2020 0400   LABSPEC 1.011 05/22/2020 0400   PHURINE 7.0 05/22/2020 0400   GLUCOSEU NEGATIVE 05/22/2020 0400   GLUCOSEU NEGATIVE 08/18/2019 1148   HGBUR LARGE (A) 05/22/2020 0400   BILIRUBINUR NEGATIVE 05/22/2020 0400   BILIRUBINUR neg 08/04/2019 1643   KETONESUR NEGATIVE 05/22/2020 0400   PROTEINUR 100 (A) 05/22/2020 0400   UROBILINOGEN 0.2 08/18/2019 1148   NITRITE NEGATIVE 05/22/2020 0400   LEUKOCYTESUR TRACE (A) 05/22/2020 0400   Sepsis Labs: Invalid input(s): PROCALCITONIN, LACTICIDVEN  Recent Results (from the past 240 hour(s))  Respiratory Panel by  RT PCR (Flu A&B, Covid) - Nasopharyngeal Gray     Status: Abnormal   Collection Time: 05/21/20  4:14 PM   Specimen: Nasopharyngeal Gray  Result Value Ref Range Status   SARS Coronavirus 2 by RT PCR POSITIVE (A) NEGATIVE Final    Comment: RESULT CALLED TO, READ BACK BY AND VERIFIED WITH: K PATE RN 05/21/20 AT 1745 SK (NOTE) SARS-CoV-2 target nucleic acids are DETECTED.  SARS-CoV-2 RNA is generally detectable in upper respiratory specimens  during the acute phase of infection. Positive results are indicative of the presence of the identified virus, but do not rule out bacterial infection or co-infection with other pathogens not detected by the test. Clinical correlation with patient history and other diagnostic information is necessary to determine patient infection status. The expected result is Negative.  Fact Sheet for Patients:  PinkCheek.be  Fact Sheet for Healthcare Providers: GravelBags.it  This test is not yet approved or cleared by the  [place] FDA and  has been authorized for detection and/or diagnosis of SARS-CoV-2 by FDA under an Emergency Use Authorization (EUA).  This EUA will remain in effect (meaning this test can be used)  for the duration of  the COVID-19 declaration under Section 564(b)(1) of the Act, 21 U.S.C. section 360bbb-3(b)(1), unless the authorization is terminated or revoked sooner.      Influenza A by PCR NEGATIVE NEGATIVE Final   Influenza B by PCR NEGATIVE NEGATIVE Final    Comment: (NOTE) The Xpert Xpress SARS-CoV-2/FLU/RSV assay is intended as an aid in  the diagnosis of influenza from Nasopharyngeal Gray specimens and  should not be used as a sole basis for treatment. Nasal washings and  aspirates are unacceptable for Xpert Xpress SARS-CoV-2/FLU/RSV  testing.  Fact Sheet for Patients: PinkCheek.be  Fact Sheet for Healthcare Providers: GravelBags.it  This test is not yet approved or cleared by the [place] FDA and  has been authorized for detection and/or diagnosis of SARS-CoV-2 by  FDA under an Emergency Use Authorization (EUA). This EUA will remain  in effect (meaning this test can be used) for the duration of the  Covid-19 declaration under Section 564(b)(1) of the Act, 21  U.S.C. section 360bbb-3(b)(1), unless the authorization is  terminated or revoked. Performed at [facility], [place] [address] [place], [place] [address]   Culture, blood (Routine x 2)     Status: None   Collection Time: 05/21/20  4:54 PM   Specimen: BLOOD  Result Value Ref Range Status   Specimen Description BLOOD RIGHT ANTECUBITAL  Final   Special Requests   Final    BOTTLES DRAWN AEROBIC AND ANAEROBIC Blood Culture adequate volume   Culture   Final    NO GROWTH 5 DAYS Performed at [facility], [place] [address] [place], [place] [address]    Report Status 05/26/2020 FINAL  Final  Culture, blood (Routine x 2)     Status: None    Collection Time: 05/21/20 10:45 PM   Specimen: BLOOD  Result Value Ref Range Status   Specimen Description BLOOD LEFT ANTECUBITAL  Final   Special Requests   Final    BOTTLES DRAWN AEROBIC ONLY Blood Culture results may not be optimal due to an inadequate volume of blood received in culture bottles   Culture   Final    NO GROWTH 5 DAYS Performed at [facility], [place] [address] [place], [place] [address]    Report Status 05/26/2020 FINAL  Final  Time spent: 35 minutes, more than 50% at bedside and on the phone with the daughter  Radiology Studies: No results found.  Marzetta Board, MD, PhD [facility]  Between 7 am - 7 pm I am available, please contact me via Amion or Securechat  Between 7 pm - 7 am I am not available, please contact night coverage MD/APP via Amion

## 2020-05-27 ENCOUNTER — Ambulatory Visit (HOSPITAL_COMMUNITY): Payer: Medicare HMO

## 2020-05-27 MED ORDER — DEXAMETHASONE 6 MG PO TABS
6.0000 mg | ORAL_TABLET | Freq: Every day | ORAL | 0 refills | Status: DC
Start: 2020-05-28 — End: 2020-05-28

## 2020-05-27 NOTE — TOC Transition Note (Signed)
Transition of Care [facility]) - CM/SW Discharge Note   Patient Details  Name: Matthew Gray MRN: [id number] Date of Birth: [birth year]  [facility] CM/SW Contact:  Sharin Mons, RN Phone Number: [phone] 05/27/2020, 12:30 PM   Clinical Narrative:    Patient will DC to: Home  Anticipated DC date: 05/27/2020 Family notified: daughter Transport by: Corey Harold  Per MD patient ready for DC today. RN, patient, and pt's daughter notified of DC. HH order noted for PT.NCM spoke with daughter , daughter states agreeable to [facility] services. Choice provided. Daughter without preference. Referral made with [facility] and accepted, SOC within 48 hrs. DME rolling will be provided by nurse from floor stock to go home with. Post hospital follow up noted on AVS. Daughter with Rx med concerns or affordability.  Ambulance transport requested for patient, PTAR.   RNCM will sign off for now as intervention is no longer needed. Please consult [place] again if new needs arise.   Final next level of care: [place] Barriers to Discharge: No Barriers Identified   Patient Goals and CMS Choice Patient states their goals for this hospitalization and ongoing recovery are:: Left message with daughter, [name], caregiver   Choice offered to / list presented to : Adult Children  Discharge Placement                       Discharge Plan and Services   Discharge Planning Services: CM Consult            DME Arranged: [place] rolling DME Agency: [employer] Date DME Agency Contacted: 05/27/20 Time DME Agency Contacted: 1229 Representative spoke with at DME Agency: Nurse to provide from floor stock HH Arranged: PT [place]: [facility] Date [place]: 05/27/20 Time [place]: 1135 Representative spoke with at [place]: [place]  Social Determinants of Health ([place]) Interventions     Readmission Risk Interventions No flowsheet data  found.

## 2020-05-27 NOTE — Progress Notes (Signed)
Pt is ready for discharge. Discharge instructions and AVS called to pt daughter Zaki Gertsch. family vocalized understanding of discharge education. Pt was removed from telemetry, IVs removed, family notified that [place] will be transporting pt home. Vitals are stable and there are no complaints or further questions at this time.

## 2020-05-27 NOTE — Discharge Summary (Signed)
Physician Discharge Summary  Matthew Gray PQZ:[id number] DOB: 10-27-1933 DOA: 05/21/2020  PCP: Matthew Borg, MD  Admit date: 05/21/2020 Discharge date: 05/27/2020  Admitted From: home Disposition:  home  Recommendations for Outpatient Follow-up:  1. Follow up with PCP in 1-2 weeks  Home Health: PT Equipment/Devices: walker  Discharge Condition: stable CODE STATUS: Full code Diet recommendation: regular  HPI: Per admitting MD, Matthew Gray is a 86 y.o. male with medical history significant of dementia, COPD, CAD, HLD. Pt presents to ED with 3 day h/o generalized weakness, fatigue, AMS and confusion. On Sunday pt had onset of mild symptoms.  Today at daycare wasn't feeling very well so was sent home.  Voided on floor at home which is unusual. EMS called. Pt is unvaccinated to [place].  Hospital Course / Discharge diagnoses: Principal Problem Covid-19 Viral Illness /pneumonia, acute hypoxic respiratory failure -patient was admitted to the hospital with COVID-19 pneumonia.  He initially received antibiotics in the ER, however on the next morning he was having respiratory symptoms and was found to be hypoxic.  He was started on remdesivir, completed 5 days while hospitalized.  He was also started on steroids.  Clinically he has improved, he has been weaned off to room air and will be discharged home in stable condition with 5 additional days of Decadron to complete a 10-day course.  Active Problems COPD-stable, no wheezing, continue home medications Acute kidney injury -possibly due to poor p.o. intake, improved after fluid challenge Acute metabolic encephalopathy superimposed on advanced dementia-is metabolic encephalopathy was likely in the setting of Covid illness, he actually looks a little bit more confused today, speech is different, ordered an MRI however daughter did not want patient to get one. Essential hypertension-continue Norvasc Pressure injury, POA  Discharge  Instructions  Allergies as of 05/27/2020      Reactions   Atorvastatin Other (See Comments)   REACTION: myalgias   Simvastatin Other (See Comments)   REACTION: myalgias   Zetia [ezetimibe] Other (See Comments)   weakness   Ace Inhibitors Other (See Comments)   unknown   Penicillins Other (See Comments)   Unknown; Tolerated Ceftriaxone on 10/2018      Medication List    TAKE these medications   acetaminophen 325 MG tablet Commonly known as: TYLENOL Take 2 tablets (650 mg total) by mouth every 6 (six) hours as needed for mild pain (or Fever >/= 101).   Alive Mens Energy Tabs Take 1 tablet by mouth daily.   amLODipine 5 MG tablet Commonly known as: NORVASC TAKE 1 TABLET EVERY DAY   aspirin EC 81 MG tablet Take 1 tablet (81 mg total) by mouth daily.   dexamethasone 6 MG tablet Commonly known as: DECADRON Take 1 tablet (6 mg total) by mouth daily. Start taking on: May 28, 2020   donepezil 10 MG tablet Commonly known as: ARICEPT TAKE 1 TABLET EVERY DAY  AT  2AM What changed: See the new instructions.   Flutter Devi 1 Device by Does not apply route as needed.   HYDROcodone-acetaminophen 7.5-325 MG tablet Commonly known as: NORCO Take 1 tablet by mouth every 6 (six) hours as needed for moderate pain.   ipratropium-albuterol 0.5-2.5 (3) MG/3ML Soln Commonly known as: DUONEB Take 3 mLs by nebulization every 6 (six) hours as needed (for cough or shortness of breath). What changed: when to take this   oxyCODONE 5 MG immediate release tablet Commonly known as: Oxy IR/ROXICODONE Take 1 tablet (5 mg total) by mouth every  4 (four) hours as needed for severe pain.   promethazine-dextromethorphan 6.25-15 MG/5ML syrup Commonly known as: PROMETHAZINE-DM Take 5 mLs by mouth 4 (four) times daily as needed for cough.   QUEtiapine 25 MG tablet Commonly known as: SEROQUEL 1 tab by mouth twice per day What changed:   how much to take  how to take this  when to take  this  additional instructions            Durable Medical Equipment  (From admission, onward)         Start     Ordered   05/27/20 1047  For home use only DME Walker  Once       Question:  Patient needs a walker to treat with the following condition  Answer:  Acute hypoxemic respiratory failure due to COVID-19 [facility])   05/27/20 1047          Follow-up Information    Matthew Borg, MD. Schedule an appointment as soon as possible for a visit.   Specialties: Internal Medicine, Radiology Contact information: [place] [address] [phone]               Consultations:  None   Procedures/Studies:  DG Chest Portable 1 View  Result Date: 05/21/2020 CLINICAL DATA:  Cough, fever EXAM: PORTABLE CHEST 1 VIEW COMPARISON:  03/30/2020 FINDINGS: Stable appearance of cardiomediastinal contours. Increased density in the RIGHT upper and mid chest since the prior study. Mild increased interstitial markings. Basilar opacities appear to be improved with some retrocardiac opacification on the LEFT. On limited assessment no acute skeletal process. IMPRESSION: 1. Increased density in the RIGHT upper and mid chest since the prior study this may represent developing pneumonia. Suggest follow-up to ensure resolution. 2. Basilar opacities appear to be improved with some retrocardiac opacification on the LEFT. 3. Mild increased interstitial markings. Electronically Signed   By: Zetta Bills M.D.   On: 05/21/2020 19:05   CT Renal Stone Study  Result Date: 04/28/2020 CLINICAL DATA:  Hematuria unknown cause EXAM: CT ABDOMEN AND PELVIS WITHOUT CONTRAST TECHNIQUE: Multidetector CT imaging of the abdomen and pelvis was performed following the standard protocol without IV contrast. COMPARISON:  November 02, 2015 FINDINGS: Lower chest: The visualized heart size within normal limits. No pericardial fluid/thickening. Rounded patchy airspace opacity with with bronchiectasis seen at the  posterior left lung base. Again noted are areas of tree-in-bud opacities throughout the visualized lingula and posterior right lower lobe. Hepatobiliary: Although limited due to the lack of intravenous contrast, normal in appearance without gross focal abnormality. No evidence of calcified gallstones or biliary ductal dilatation. Pancreas:  Unremarkable.  No surrounding inflammatory changes. Spleen: Normal in size. Although limited due to the lack of intravenous contrast, normal in appearance. Adrenals/Urinary Tract: Both adrenal glands appear normal. Again noted are probable bilateral renal vascular calcifications. There is a low-density lesion seen off the lower pole of the right kidney measuring 7.6 cm, likely renal cyst. No hydronephrosis. There is hyperdense rounded soft tissue density seen in the superior right aspect of the bladder wall with diffuse mild bladder wall thickening. Stomach/Bowel: The stomach, small bowel, and colon are normal in appearance. No inflammatory changes or obstructive findings. Scattered colonic diverticula are noted. Vascular/Lymphatic: There are no enlarged abdominal or pelvic lymph nodes. Scattered aortic atherosclerotic calcifications are seen without aneurysmal dilatation. There appears to be focal aneurysmal dilatation of the left common iliac vasculature measuring up to 3.6 cm in transverse dimension. Reproductive: The  prostate is unremarkable. Other: No evidence of abdominal wall mass or hernia. Musculoskeletal: No acute or significant osseous findings. Again noted is a subacute to chronic superior compression deformities of the L2 and L3 vertebral body with 25-30% loss of vertebral body height as on the prior MRI of March 31, 2020. IMPRESSION: 1. Heterogeneously hyperdense masslike area within the superior and right bladder wall. These findings could be due to bladder neoplasm would recommend direct visualization for further evaluation. 2. Multifocal patchy airspace opacity  at the left lung base with tree-in-bud opacities bilaterally which could be due to chronic bronchitis or infectious etiology. 3.  Aortic Atherosclerosis (ICD10-I70.0). 4. 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 5. Subacute/chronic compression deformities of L2 and L3. Electronically Signed   By: Prudencio Pair M.D.   On: 04/28/2020 02:54     Subjective: - no chest pain, shortness of breath, no abdominal pain, nausea or vomiting.   Discharge Exam: BP (!) 164/107 (BP Location: Right Arm)   Pulse 68   Temp 98.4 F (36.9 C) (Oral)   Resp 20   Ht 5\' 8"  (1.727 m)   Wt 68 kg   SpO2 100%   BMI 22.81 kg/m   General: Pt is alert, awake, not in acute distress Cardiovascular: RRR, S1/S2 +, no rubs, no gallops Respiratory: CTA bilaterally, no wheezing, no rhonchi Abdominal: Soft, NT, ND, bowel sounds + Extremities: no edema, no cyanosis   The results of significant diagnostics from this hospitalization (including imaging, microbiology, ancillary and laboratory) are listed below for reference.     Microbiology: Recent Results (from the past 240 hour(s))  Respiratory Panel by RT PCR (Flu A&B, Covid) - Nasopharyngeal Swab     Status: Abnormal   Collection Time: 05/21/20  4:14 PM   Specimen: Nasopharyngeal Swab  Result Value Ref Range Status   SARS Coronavirus 2 by RT PCR POSITIVE (A) NEGATIVE Final    Comment: RESULT CALLED TO, READ BACK BY AND VERIFIED WITH: K PATE RN 05/21/20 AT 1745 SK (NOTE) SARS-CoV-2 target nucleic acids are DETECTED.  SARS-CoV-2 RNA is generally detectable in upper respiratory specimens  during the acute phase of infection. Positive results are indicative of the presence of the identified virus, but do not rule out bacterial infection or co-infection with other pathogens not detected by the test. Clinical correlation with patient history and other diagnostic information is necessary to determine patient infection status. The expected result is  Negative.  Fact Sheet for Patients:  PinkCheek.be  Fact Sheet for Healthcare Providers: GravelBags.it  This test is not yet approved or cleared by the [place] FDA and  has been authorized for detection and/or diagnosis of SARS-CoV-2 by FDA under an Emergency Use Authorization (EUA).  This EUA will remain in effect (meaning this test can be used)  for the duration of  the COVID-19 declaration under Section 564(b)(1) of the Act, 21 U.S.C. section 360bbb-3(b)(1), unless the authorization is terminated or revoked sooner.      Influenza A by PCR NEGATIVE NEGATIVE Final   Influenza B by PCR NEGATIVE NEGATIVE Final    Comment: (NOTE) The Xpert Xpress SARS-CoV-2/FLU/RSV assay is intended as an aid in  the diagnosis of influenza from Nasopharyngeal swab specimens and  should not be used as a sole basis for treatment. Nasal washings and  aspirates are unacceptable for Xpert Xpress SARS-CoV-2/FLU/RSV  testing.  Fact Sheet for Patients: PinkCheek.be  Fact Sheet for Healthcare Providers: GravelBags.it  This test is not yet approved or  cleared by the [place] and  has been authorized for detection and/or diagnosis of SARS-CoV-2 by  FDA under an Emergency Use Authorization (EUA). This EUA will remain  in effect (meaning this test can be used) for the duration of the  Covid-19 declaration under Section 564(b)(1) of the Act, 21  U.S.C. section 360bbb-3(b)(1), unless the authorization is  terminated or revoked. Performed at [facility], [place] [address], [place], [place] [address]   Culture, blood (Routine x 2)     Status: None   Collection Time: 05/21/20  4:54 PM   Specimen: BLOOD  Result Value Ref Range Status   Specimen Description BLOOD RIGHT ANTECUBITAL  Final   Special Requests   Final    BOTTLES DRAWN AEROBIC AND ANAEROBIC Blood Culture adequate  volume   Culture   Final    NO GROWTH 5 DAYS Performed at [facility], [place] [address], [place], [place] [address]    Report Status 05/26/2020 FINAL  Final  Culture, blood (Routine x 2)     Status: None   Collection Time: 05/21/20 10:45 PM   Specimen: BLOOD  Result Value Ref Range Status   Specimen Description BLOOD LEFT ANTECUBITAL  Final   Special Requests   Final    BOTTLES DRAWN AEROBIC ONLY Blood Culture results may not be optimal due to an inadequate volume of blood received in culture bottles   Culture   Final    NO GROWTH 5 DAYS Performed at [facility], [place] [address], [place], [place] [address]    Report Status 05/26/2020 FINAL  Final     Labs: Basic Metabolic Panel: Recent Labs  Lab 05/22/20 0547 05/23/20 0847 05/24/20 1314 05/25/20 0452 05/26/20 0733  NA 141 140 141 136 142  K 4.3 3.9 3.7 3.7 4.2  CL 101 101 101 99 104  CO2 30 20* 28 26 27   GLUCOSE 83 68* 106* 119* 95  BUN 25* 22 46* 53* 49*  CREATININE 1.17 1.09 1.95* 1.86* 1.37*  CALCIUM 9.0 8.8* 8.6* 8.7* 8.7*   Liver Function Tests: Recent Labs  Lab 05/22/20 0547 05/23/20 0847 05/24/20 1314 05/25/20 0452 05/26/20 0733  AST 35 41 41 37 42*  ALT 13 19 19 19 27   ALKPHOS 60 65 54 50 53  BILITOT 1.3* 0.7 0.5 0.5 0.7  PROT 7.2 7.5 6.6 6.5 6.2*  ALBUMIN 3.0* 3.1* 2.7* 2.7* 2.6*   CBC: Recent Labs  Lab 05/22/20 0547 05/23/20 0847 05/24/20 1314 05/25/20 0452 05/26/20 0733  WBC 3.9* 4.7 4.4 3.7* 7.0  NEUTROABS 2.6 3.2 3.5 3.1 6.0  HGB 12.9* 14.3 12.9* 12.8* 12.2*  HCT 42.1 46.2 41.5 41.1 39.4  MCV 82.5 81.2 79.2* 78.9* 79.1*  PLT 202 255 215 191 181   CBG: Recent Labs  Lab 05/24/20 1214  GLUCAP 119*   Hgb A1c No results for input(s): HGBA1C in the last 72 hours. Lipid Profile No results for input(s): CHOL, HDL, LDLCALC, TRIG, CHOLHDL, LDLDIRECT in the last 72 hours. Thyroid function studies No results for input(s): TSH, T4TOTAL, T3FREE, THYROIDAB in the last 72  hours.  Invalid input(s): FREET3 Urinalysis    Component Value Date/Time   COLORURINE YELLOW 05/22/2020 0400   APPEARANCEUR HAZY (A) 05/22/2020 0400   LABSPEC 1.011 05/22/2020 0400   PHURINE 7.0 05/22/2020 0400   GLUCOSEU NEGATIVE 05/22/2020 0400   GLUCOSEU NEGATIVE 08/18/2019 1148   HGBUR LARGE (A) 05/22/2020 0400   BILIRUBINUR NEGATIVE 05/22/2020 0400  BILIRUBINUR neg 08/04/2019 1643   KETONESUR NEGATIVE 05/22/2020 0400   PROTEINUR 100 (A) 05/22/2020 0400   UROBILINOGEN 0.2 08/18/2019 1148   NITRITE NEGATIVE 05/22/2020 0400   LEUKOCYTESUR TRACE (A) 05/22/2020 0400    FURTHER DISCHARGE INSTRUCTIONS:   Get Medicines reviewed and adjusted: Please take all your medications with you for your next visit with your Primary MD   Laboratory/radiological data: Please request your Primary MD to go over all hospital tests and procedure/radiological results at the follow up, please ask your Primary MD to get all Hospital records sent to his/her office.   In some cases, they will be blood work, cultures and biopsy results pending at the time of your discharge. Please request that your primary care M.D. goes through all the records of your hospital data and follows up on these results.   Also Note the following: If you experience worsening of your admission symptoms, develop shortness of breath, life threatening emergency, suicidal or homicidal thoughts you must seek medical attention immediately by calling 911 or calling your MD immediately  if symptoms less severe.   You must read complete instructions/literature along with all the possible adverse reactions/side effects for all the Medicines you take and that have been prescribed to you. Take any new Medicines after you have completely understood and accpet all the possible adverse reactions/side effects.    Do not drive when taking Pain medications or sleeping medications (Benzodaizepines)   Do not take more than prescribed Pain, Sleep  and Anxiety Medications. It is not advisable to combine anxiety,sleep and pain medications without talking with your primary care practitioner   Special Instructions: If you have smoked or chewed Tobacco  in the last 2 yrs please stop smoking, stop any regular Alcohol  and or any Recreational drug use.   Wear Seat belts while driving.   Please note: You were cared for by a hospitalist during your hospital stay. Once you are discharged, your primary care physician will handle any further medical issues. Please note that NO REFILLS for any discharge medications will be authorized once you are discharged, as it is imperative that you return to your primary care physician (or establish a relationship with a primary care physician if you do not have one) for your post hospital discharge needs so that they can reassess your need for medications and monitor your lab values.  Time coordinating discharge: 45 minutes  SIGNED:  Marzetta Board, MD, PhD 05/27/2020, 10:47 AM

## 2020-05-27 NOTE — Progress Notes (Signed)
Patient refusing MRI yesterday (10/2) as well as today (10/3).

## 2020-05-28 ENCOUNTER — Other Ambulatory Visit (HOSPITAL_COMMUNITY): Payer: Medicare HMO

## 2020-05-28 ENCOUNTER — Other Ambulatory Visit: Payer: Self-pay | Admitting: Internal Medicine

## 2020-05-28 ENCOUNTER — Telehealth: Payer: Self-pay | Admitting: Internal Medicine

## 2020-05-28 ENCOUNTER — Ambulatory Visit: Payer: Medicare HMO | Admitting: Internal Medicine

## 2020-05-28 MED ORDER — DEXAMETHASONE 6 MG PO TABS
6.0000 mg | ORAL_TABLET | Freq: Every day | ORAL | 0 refills | Status: DC
Start: 2020-05-28 — End: 2020-08-08

## 2020-05-28 MED ORDER — DEXAMETHASONE 6 MG PO TABS
6.0000 mg | ORAL_TABLET | Freq: Every day | ORAL | 0 refills | Status: DC
Start: 2020-05-28 — End: 2020-05-28

## 2020-05-28 NOTE — Telephone Encounter (Cosign Needed)
Transition Care Management Unsuccessful Follow-up Telephone Call  Date of discharge and from where:  05/27/2020 from [facility]  Attempts:  1st Attempt  Reason for unsuccessful TCM follow-up call:  Left voice message

## 2020-05-28 NOTE — Telephone Encounter (Signed)
Verbal order request  [place] aide for bathing assistance  Patien'ts daughter states he is having trouble swallowing, would like an order for speech therapy related to difficulty swalling

## 2020-05-29 ENCOUNTER — Other Ambulatory Visit: Payer: Self-pay

## 2020-05-29 DIAGNOSIS — F0390 Unspecified dementia without behavioral disturbance: Secondary | ICD-10-CM | POA: Diagnosis not present

## 2020-05-29 DIAGNOSIS — I10 Essential (primary) hypertension: Secondary | ICD-10-CM | POA: Diagnosis not present

## 2020-05-29 DIAGNOSIS — U071 COVID-19: Secondary | ICD-10-CM | POA: Diagnosis not present

## 2020-05-29 DIAGNOSIS — I2510 Atherosclerotic heart disease of native coronary artery without angina pectoris: Secondary | ICD-10-CM | POA: Diagnosis not present

## 2020-05-29 DIAGNOSIS — J309 Allergic rhinitis, unspecified: Secondary | ICD-10-CM | POA: Diagnosis not present

## 2020-05-29 DIAGNOSIS — J449 Chronic obstructive pulmonary disease, unspecified: Secondary | ICD-10-CM | POA: Diagnosis not present

## 2020-05-29 DIAGNOSIS — E785 Hyperlipidemia, unspecified: Secondary | ICD-10-CM | POA: Diagnosis not present

## 2020-05-29 DIAGNOSIS — D414 Neoplasm of uncertain behavior of bladder: Secondary | ICD-10-CM | POA: Diagnosis not present

## 2020-05-29 DIAGNOSIS — N400 Enlarged prostate without lower urinary tract symptoms: Secondary | ICD-10-CM | POA: Diagnosis not present

## 2020-05-29 NOTE — Patient Outreach (Signed)
[facility]) Care Management  05/29/2020  Della Homan 07-Oct-1933 [id number]     Transition of Care Referral  Referral Date: 05/29/2020 Referral Source: [facility] Discharge Report Date of Discharge: 05/27/2020 Facility: [place]: [facility]     Referral received. Transition of care calls being completed via EMMI-automated calls. RN CM will outreach patient for any red flags received.    Plan: RN CM will close case at this time.    Enzo Montgomery, RN,BSN,CCM [place] Management Telephonic Care Management Coordinator Direct Phone: [phone] Toll Free: [phone] Fax: [phone]

## 2020-05-30 ENCOUNTER — Encounter (HOSPITAL_BASED_OUTPATIENT_CLINIC_OR_DEPARTMENT_OTHER): Admission: RE | Payer: Self-pay | Source: Home / Self Care

## 2020-05-30 ENCOUNTER — Ambulatory Visit (HOSPITAL_BASED_OUTPATIENT_CLINIC_OR_DEPARTMENT_OTHER): Admission: RE | Admit: 2020-05-30 | Payer: Medicare HMO | Source: Home / Self Care | Admitting: Urology

## 2020-05-30 SURGERY — TURBT (TRANSURETHRAL RESECTION OF BLADDER TUMOR)
Anesthesia: General

## 2020-05-31 ENCOUNTER — Telehealth: Payer: Self-pay

## 2020-05-31 NOTE — Telephone Encounter (Cosign Needed)
Transition Care Management Follow-up Telephone Call  Date of discharge and from where: 05/27/2020 from [facility]  How have you been since you were released from the hospital? Having issues with swallowing his food; stated that he is not hurting or in any pain.  Any questions or concerns? No  Items Reviewed:  Did the pt receive and understand the discharge instructions provided? Yes   Medications obtained and verified? Yes   Any new allergies since your discharge? No   Dietary orders reviewed? No  Do you have support at home? Yes , lives with daughter, Matthew Gray.  Functional Questionnaire: (I = Independent and D = Dependent) ADLs: d  Bathing/Dressing- d  Meal Prep- d  Eating- d  Maintaining continence- d  Transferring/Ambulation- d  Managing Meds- d  Follow up appointments reviewed:   PCP Hospital f/u appt confirmed? Yes  Scheduled to see [name], MD on 06/11/2020 @ 2:00pm.  [facility] f/u appt confirmed? No    Are transportation arrangements needed? No  daughter will bring patient to appt.  If their condition worsens, is the pt aware to call PCP or go to the Emergency Dept.? Yes  Was the patient provided with contact information for the PCP's office or ED? Yes  Was to pt encouraged to call back with questions or concerns? Yes

## 2020-05-31 NOTE — Telephone Encounter (Signed)
Error

## 2020-06-01 ENCOUNTER — Telehealth: Payer: Self-pay | Admitting: Internal Medicine

## 2020-06-01 DIAGNOSIS — U071 COVID-19: Secondary | ICD-10-CM | POA: Diagnosis not present

## 2020-06-01 DIAGNOSIS — J449 Chronic obstructive pulmonary disease, unspecified: Secondary | ICD-10-CM | POA: Diagnosis not present

## 2020-06-01 DIAGNOSIS — I2510 Atherosclerotic heart disease of native coronary artery without angina pectoris: Secondary | ICD-10-CM | POA: Diagnosis not present

## 2020-06-01 DIAGNOSIS — F0390 Unspecified dementia without behavioral disturbance: Secondary | ICD-10-CM | POA: Diagnosis not present

## 2020-06-01 DIAGNOSIS — J309 Allergic rhinitis, unspecified: Secondary | ICD-10-CM | POA: Diagnosis not present

## 2020-06-01 DIAGNOSIS — I10 Essential (primary) hypertension: Secondary | ICD-10-CM | POA: Diagnosis not present

## 2020-06-01 DIAGNOSIS — E785 Hyperlipidemia, unspecified: Secondary | ICD-10-CM | POA: Diagnosis not present

## 2020-06-01 DIAGNOSIS — D414 Neoplasm of uncertain behavior of bladder: Secondary | ICD-10-CM | POA: Diagnosis not present

## 2020-06-01 DIAGNOSIS — N400 Enlarged prostate without lower urinary tract symptoms: Secondary | ICD-10-CM | POA: Diagnosis not present

## 2020-06-01 NOTE — Telephone Encounter (Signed)
   Darlene from [facility] calling, patient refused home care services. He was offered home PT/OT, the daughter declined

## 2020-06-04 ENCOUNTER — Telehealth: Payer: Self-pay | Admitting: Internal Medicine

## 2020-06-04 NOTE — Telephone Encounter (Signed)
Sent to Dr. John. 

## 2020-06-04 NOTE — Telephone Encounter (Signed)
[name] needs a letter stating that her father does not come out of quarantine until October 14th and that he has an appointment on October 18th  Patient's dementia has gone to another stage, patient is wetting himself, not sure if her dad will be able to go back to the daycare  [name] works at [employer]  Could we send to [email or web address] [address]

## 2020-06-04 NOTE — Telephone Encounter (Signed)
I think we need to be more specific, so we need a start and end date for the letter she is requesting, then it should be ok to do this

## 2020-06-06 ENCOUNTER — Telehealth: Payer: Self-pay | Admitting: Internal Medicine

## 2020-06-06 NOTE — Telephone Encounter (Signed)
Yes, this should be ok for the letter

## 2020-06-06 NOTE — Telephone Encounter (Signed)
error 

## 2020-06-06 NOTE — Telephone Encounter (Signed)
Patients daughter [name] called and stated she needed a letter for her job stating she was out with her dad for his hospital visit/ quarantine from September 28,2021- October 18th,2021

## 2020-06-06 NOTE — Telephone Encounter (Signed)
Called pts daughter and ldvm asking her to call the clinic back as Dr. Jenny Reichmann is needing the start and end dates to put in the letter.

## 2020-06-06 NOTE — Telephone Encounter (Signed)
Sent to Dr. John. 

## 2020-06-08 ENCOUNTER — Encounter: Payer: Self-pay | Admitting: Internal Medicine

## 2020-06-08 NOTE — Telephone Encounter (Signed)
Letter has been written and printed. Awaiting on pts daughter to come pick it up.

## 2020-06-11 ENCOUNTER — Encounter: Payer: Self-pay | Admitting: Internal Medicine

## 2020-06-11 ENCOUNTER — Other Ambulatory Visit: Payer: Medicare HMO

## 2020-06-11 ENCOUNTER — Ambulatory Visit (INDEPENDENT_AMBULATORY_CARE_PROVIDER_SITE_OTHER): Payer: Medicare HMO | Admitting: Internal Medicine

## 2020-06-11 ENCOUNTER — Other Ambulatory Visit: Payer: Self-pay

## 2020-06-11 VITALS — BP 130/82 | HR 80 | Temp 98.2°F | Ht 68.0 in | Wt 120.0 lb

## 2020-06-11 DIAGNOSIS — I10 Essential (primary) hypertension: Secondary | ICD-10-CM

## 2020-06-11 DIAGNOSIS — Z8701 Personal history of pneumonia (recurrent): Secondary | ICD-10-CM

## 2020-06-11 DIAGNOSIS — N1831 Chronic kidney disease, stage 3a: Secondary | ICD-10-CM | POA: Diagnosis not present

## 2020-06-11 DIAGNOSIS — G9349 Other encephalopathy: Secondary | ICD-10-CM

## 2020-06-11 DIAGNOSIS — U071 COVID-19: Secondary | ICD-10-CM

## 2020-06-11 DIAGNOSIS — Z20822 Contact with and (suspected) exposure to covid-19: Secondary | ICD-10-CM | POA: Diagnosis not present

## 2020-06-11 DIAGNOSIS — N189 Chronic kidney disease, unspecified: Secondary | ICD-10-CM | POA: Insufficient documentation

## 2020-06-11 DIAGNOSIS — N179 Acute kidney failure, unspecified: Secondary | ICD-10-CM | POA: Insufficient documentation

## 2020-06-11 DIAGNOSIS — Z8616 Personal history of COVID-19: Secondary | ICD-10-CM

## 2020-06-11 LAB — BASIC METABOLIC PANEL
BUN: 21 mg/dL (ref 6–23)
CO2: 28 mEq/L (ref 19–32)
Calcium: 8.8 mg/dL (ref 8.4–10.5)
Chloride: 100 mEq/L (ref 96–112)
Creatinine, Ser: 1.04 mg/dL (ref 0.40–1.50)
GFR: 65.68 mL/min (ref >60.00)
Glucose, Bld: 76 mg/dL (ref 70–99)
Potassium: 3.8 mEq/L (ref 3.5–5.1)
Sodium: 137 mEq/L (ref 135–145)

## 2020-06-11 NOTE — Progress Notes (Signed)
° °  Subjective:    Patient ID: Matthew Gray, male    DOB: [birth year], 86 y.o.   MRN: [id number]  HPI  Here s/p hospn sept 27- oct 3 with generalied weakness, urinary incontinence, fatigue, AMS/confusion, seemed worse just prior to admit  Unvaccinated to covid.  Was found + covid with PNA/acute hypoxicresp failure, tx with remdesivir and steroid, and weaned off o2 for d/c home to complete another 5 days decadron. COPD remained stable. Confusion improved during hospn to baseline  AKI resolved with IVF.      Post hospn - had some dysphagia but per daughter was only his holding food in the mouth but has gradually resolved and now eating well.  There was question of dysphagia, but only that his appetite is low, not wanting to eat during the covid, and appetite now improved and eats ok without cough and choking, vomiting.      Still has some baseline mild confusion worse than previous.  Wants to try to get him back to adult daycare and having some increased baseline incontinence, wearing pampers but she thinks they can handle that.  Still has some fatigue post covid as well, not having to need walker today, back to usual meds.  Daycare will take him back, but are concerned and mentioned testing at 3 months neg prior to taking him back.  Duaghter needs another letter to extend her time off in case he is not able to actually get back.      [facility] Declined home PT/OT, pt has walker for home. She asks for letter regarding quarantine to oct 14, but needs further to make sure he can tolerate adult daycare.   Transitional Care Management elements noted today: 1)  Date of D/C: as above 2)  Medication reconciliation:  done today at end visit 3)  Review of D/C summary or other information:  done today 4)  Review of need for f/u on pending diagnostic tests and treatments:  done today 5)  Review of need for Interaction with other providers who will assume or resume care of pt specific problems: done today 6)  Education  of patient/family/guardian or caregiver: done today 7)  Assess for Establishment or Re-establishment of referrals and arranging for needed community resources:  done today 8)  Assess for Assistance in scheduling any required follow up with community providers and services:  done today   Review of Systems     Objective:   Physical Exam        Assessment & Plan:

## 2020-06-11 NOTE — Assessment & Plan Note (Signed)
Resolved,  to f/u any worsening symptoms or concerns  

## 2020-06-11 NOTE — Assessment & Plan Note (Signed)
stable overall by history and exam, recent data reviewed with pt, and pt to continue medical treatment as before,  to f/u any worsening symptoms or concerns  

## 2020-06-11 NOTE — Patient Instructions (Addendum)
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  Please have the pharmacy call with any other refills you may need.  Please keep your appointments with your specialists as you may have planned  Please go to the LAB at the blood drawing area for the tests to be done  You will be contacted by phone if any changes need to be made immediately.  Otherwise, you will receive a letter about your results with an explanation, but please check with MyChart first.  Please remember to sign up for MyChart if you have not done so, as this will be important to you in the future with finding out test results, communicating by private email, and scheduling acute appointments online when needed.  Please make an Appointment to return in 3 months

## 2020-06-11 NOTE — Assessment & Plan Note (Signed)
Possibly mild persistent, still hoping for further resolution of long haul symptoms in the next few months and back to baseline

## 2020-06-11 NOTE — Assessment & Plan Note (Signed)
Now taking po well, for f/u BMP

## 2020-06-12 ENCOUNTER — Telehealth: Payer: Self-pay

## 2020-06-12 LAB — NOVEL CORONAVIRUS, NAA: SARS-CoV-2, NAA: DETECTED — AB

## 2020-06-12 LAB — SARS-COV-2, NAA 2 DAY TAT

## 2020-06-12 NOTE — Telephone Encounter (Signed)
Per pt's daughter instruction, attempted to fax medical exam paperwork to daycare center.  No answer noted from fax.  Called facility & left message that needed email to scan requested information.   If she calls back, please get the email that secure paperwork can be sent to.  Will attempt to call back.

## 2020-06-24 DIAGNOSIS — J449 Chronic obstructive pulmonary disease, unspecified: Secondary | ICD-10-CM | POA: Diagnosis not present

## 2020-06-25 ENCOUNTER — Ambulatory Visit: Payer: Medicare HMO

## 2020-06-25 ENCOUNTER — Other Ambulatory Visit: Payer: Self-pay

## 2020-06-25 ENCOUNTER — Other Ambulatory Visit: Payer: Self-pay | Admitting: Acute Care

## 2020-07-24 DIAGNOSIS — J449 Chronic obstructive pulmonary disease, unspecified: Secondary | ICD-10-CM | POA: Diagnosis not present

## 2020-07-26 ENCOUNTER — Other Ambulatory Visit: Payer: Self-pay | Admitting: Urology

## 2020-07-26 ENCOUNTER — Telehealth: Payer: Self-pay | Admitting: Internal Medicine

## 2020-07-26 NOTE — Telephone Encounter (Addendum)
    Request for surgical clearance:  1. What type of surgery is being performed? CYSTOSCOPY WITH BILATERAL RETROGRADE PYELOGRAM/ TRANSURETHRAL RESECTION OF BLADDER TUMOR (TURBT)  2. When is this surgery scheduled? 08/09/20  3. What type of clearance is required (medical clearance vs. Pharmacy clearance to hold med vs. Both)? both  4. Are there any medications that need to be held prior to surgery and how long? unknown  5. Practice name and name of physician performing surgery? Ardis Hughs, MD  6. What is the office phone number? [place]   What is the office fax number? [phone]  8.   Anesthesia type (None, local, MAC, general) ? unknown

## 2020-07-27 NOTE — Telephone Encounter (Signed)
Sent to Dr. Jenny Reichmann.

## 2020-07-30 ENCOUNTER — Other Ambulatory Visit: Payer: Self-pay | Admitting: Urology

## 2020-07-30 NOTE — Telephone Encounter (Signed)
[facility] for medical clearance for patient  Ok to hold Aspirin 81 mg for 7 days prior

## 2020-07-31 NOTE — Telephone Encounter (Signed)
LDVM for [facility] with [facility] verbal ok for Surgical Clearance and a medication hold on the Aspirin 81mg  for 7 days prior to surgery per Dr. Jenny Reichmann.

## 2020-08-07 ENCOUNTER — Encounter (HOSPITAL_BASED_OUTPATIENT_CLINIC_OR_DEPARTMENT_OTHER): Payer: Self-pay | Admitting: Urology

## 2020-08-08 ENCOUNTER — Other Ambulatory Visit: Payer: Self-pay

## 2020-08-08 ENCOUNTER — Encounter (HOSPITAL_BASED_OUTPATIENT_CLINIC_OR_DEPARTMENT_OTHER): Payer: Self-pay | Admitting: Urology

## 2020-08-08 NOTE — Progress Notes (Addendum)
Spoke w/ via phone for pre-op interview--- [name] daughter, [name], pt has dementia Lab needs dos---- [employer]             Lab results------ current ekg in epic/ chart COVID test ------ no test since positive covid 05-21-2020, results in epic Arrive at ------- 0900 NPO after MN  Medications to take morning of surgery ----- Duoneb nebulizer (per daughter pt does take any meds without having eaten food first) Diabetic medication ----- n/a Patient Special Instructions ----- n/a  Pre-Op special Istructions ----- pt has pcp surgical and asa clearance , Dr Marshall Cork, dated 07-31-2020 w/ chart.  Daughter will need to be w/ pt in pre-op  Patient daughter verbalized understanding of instructions that were given at this phone interview. Patient daughter stated pt denies, chest pain, fever, but pt has chronic cough at this phone interview.   Anesthesia :  HTN;  PVD:  CAD;  COPD w/ Asthma (hx recurrent pneumonia) , long hx chronic cough and DOE;  Advanced dementia;  CKD3;  BPH;  Positive covid 05-21-2020 w/ covid pneumonia , admitted , discharged 05-27-2020 only needed oxygen while in hospital not discharged w/ oxygen;  Mild encephalopathy d/t covid;  Pt did have follow up w/ pcp  note dated 06-11-2020 in epic.    PCP:  Dr Marshall Cork (lov 06-11-2020 epic) Cardiologist : no Chest x-ray : 05-21-2020 epic/ CT 09-12-2019 epic EKG : 05-21-2020 epic Echo :  09-22-2011 epic Stress test: not since 2002 cath Cardiac Cath :  08-11-2001 epic Activity level:  DOE w/ long distance walk and stairs but recovers quickly Sleep Study/ CPAP :  NO Fasting Blood Sugar :      / Checks Blood Sugar -- times a day:   N/A Blood Thinner/ Instructions /Last Dose: NO ASA / Instructions/ Last Dose :  ASA 81mg /  Pt had pcp clearance to stop asa prior to surgery w/ chart,  Last dose 08-04-2020 per pt daughter

## 2020-08-09 ENCOUNTER — Encounter (HOSPITAL_BASED_OUTPATIENT_CLINIC_OR_DEPARTMENT_OTHER)
Admission: RE | Disposition: A | Discharge status: Home / Self Care | Payer: Self-pay | Source: Home / Self Care | Attending: Urology

## 2020-08-09 ENCOUNTER — Encounter (HOSPITAL_BASED_OUTPATIENT_CLINIC_OR_DEPARTMENT_OTHER): Payer: Self-pay | Admitting: Urology

## 2020-08-09 ENCOUNTER — Other Ambulatory Visit: Payer: Self-pay

## 2020-08-09 ENCOUNTER — Ambulatory Visit (HOSPITAL_BASED_OUTPATIENT_CLINIC_OR_DEPARTMENT_OTHER): Payer: Medicare HMO | Admitting: Anesthesiology

## 2020-08-09 ENCOUNTER — Ambulatory Visit (HOSPITAL_BASED_OUTPATIENT_CLINIC_OR_DEPARTMENT_OTHER)
Admission: RE | Admit: 2020-08-09 | Discharge: 2020-08-09 | Disposition: A | Discharge status: Home / Self Care | Payer: Medicare HMO | Attending: Urology | Admitting: Urology

## 2020-08-09 DIAGNOSIS — I129 Hypertensive chronic kidney disease with stage 1 through stage 4 chronic kidney disease, or unspecified chronic kidney disease: Secondary | ICD-10-CM | POA: Diagnosis not present

## 2020-08-09 DIAGNOSIS — J449 Chronic obstructive pulmonary disease, unspecified: Secondary | ICD-10-CM | POA: Diagnosis not present

## 2020-08-09 DIAGNOSIS — Z7982 Long term (current) use of aspirin: Secondary | ICD-10-CM | POA: Insufficient documentation

## 2020-08-09 DIAGNOSIS — F0390 Unspecified dementia without behavioral disturbance: Secondary | ICD-10-CM | POA: Diagnosis not present

## 2020-08-09 DIAGNOSIS — Z79899 Other long term (current) drug therapy: Secondary | ICD-10-CM | POA: Insufficient documentation

## 2020-08-09 DIAGNOSIS — I2510 Atherosclerotic heart disease of native coronary artery without angina pectoris: Secondary | ICD-10-CM | POA: Insufficient documentation

## 2020-08-09 DIAGNOSIS — R32 Unspecified urinary incontinence: Secondary | ICD-10-CM | POA: Insufficient documentation

## 2020-08-09 DIAGNOSIS — Z87891 Personal history of nicotine dependence: Secondary | ICD-10-CM | POA: Insufficient documentation

## 2020-08-09 DIAGNOSIS — D494 Neoplasm of unspecified behavior of bladder: Secondary | ICD-10-CM | POA: Diagnosis not present

## 2020-08-09 DIAGNOSIS — C679 Malignant neoplasm of bladder, unspecified: Secondary | ICD-10-CM | POA: Diagnosis not present

## 2020-08-09 DIAGNOSIS — C673 Malignant neoplasm of anterior wall of bladder: Secondary | ICD-10-CM

## 2020-08-09 DIAGNOSIS — Z880 Allergy status to penicillin: Secondary | ICD-10-CM | POA: Diagnosis not present

## 2020-08-09 DIAGNOSIS — Z888 Allergy status to other drugs, medicaments and biological substances status: Secondary | ICD-10-CM | POA: Diagnosis not present

## 2020-08-09 DIAGNOSIS — N183 Chronic kidney disease, stage 3 unspecified: Secondary | ICD-10-CM | POA: Diagnosis not present

## 2020-08-09 DIAGNOSIS — N1831 Chronic kidney disease, stage 3a: Secondary | ICD-10-CM

## 2020-08-09 DIAGNOSIS — N179 Acute kidney failure, unspecified: Secondary | ICD-10-CM | POA: Diagnosis not present

## 2020-08-09 HISTORY — DX: Hematuria, unspecified: R31.9

## 2020-08-09 HISTORY — DX: Chronic kidney disease, stage 3 unspecified: N18.30

## 2020-08-09 HISTORY — DX: Peripheral vascular disease, unspecified: I73.9

## 2020-08-09 HISTORY — DX: Depression, unspecified: F32.A

## 2020-08-09 HISTORY — DX: Personal history of colonic polyps: Z86.010

## 2020-08-09 HISTORY — DX: Other intervertebral disc degeneration, lumbar region without mention of lumbar back pain or lower extremity pain: M51.369

## 2020-08-09 HISTORY — DX: Chronic cough: R05.3

## 2020-08-09 HISTORY — DX: Atherosclerotic heart disease of native coronary artery without angina pectoris: I25.10

## 2020-08-09 HISTORY — DX: Personal history of adenomatous and serrated colon polyps: Z86.0101

## 2020-08-09 HISTORY — DX: Personal history of COVID-19: Z86.16

## 2020-08-09 HISTORY — PX: TRANSURETHRAL RESECTION OF BLADDER TUMOR: SHX2575

## 2020-08-09 HISTORY — DX: Personal history of pneumonia (recurrent): Z87.01

## 2020-08-09 HISTORY — DX: Malignant neoplasm of bladder, unspecified: C67.9

## 2020-08-09 HISTORY — DX: Hyperlipidemia, unspecified: E78.5

## 2020-08-09 HISTORY — DX: Benign prostatic hyperplasia without lower urinary tract symptoms: N40.0

## 2020-08-09 HISTORY — DX: Other specified respiratory disorders: J98.8

## 2020-08-09 HISTORY — DX: Mixed incontinence: N39.46

## 2020-08-09 HISTORY — DX: Essential (primary) hypertension: I10

## 2020-08-09 HISTORY — DX: Presence of dental prosthetic device (complete) (partial): Z97.2

## 2020-08-09 HISTORY — DX: Other specified soft tissue disorders: M79.89

## 2020-08-09 HISTORY — DX: Other intervertebral disc degeneration, lumbar region: M51.36

## 2020-08-09 HISTORY — DX: Chronic obstructive pulmonary disease, unspecified: J44.9

## 2020-08-09 HISTORY — PX: CYSTOSCOPY W/ RETROGRADES: SHX1426

## 2020-08-09 HISTORY — DX: Personal history of (healed) traumatic fracture: Z87.81

## 2020-08-09 HISTORY — DX: Allergic rhinitis, unspecified: J30.9

## 2020-08-09 HISTORY — DX: Other specified chronic obstructive pulmonary disease: J44.89

## 2020-08-09 LAB — POCT I-STAT, CHEM 8
BUN: 16 mg/dL (ref 8–23)
Calcium, Ion: 1.23 mmol/L (ref 1.15–1.40)
Chloride: 98 mmol/L (ref 98–111)
Creatinine, Ser: 1 mg/dL (ref 0.61–1.24)
Glucose, Bld: 82 mg/dL (ref 70–99)
HCT: 33 % — ABNORMAL LOW (ref 39.0–52.0)
Hemoglobin: 11.2 g/dL — ABNORMAL LOW (ref 13.0–17.0)
Potassium: 4.2 mmol/L (ref 3.5–5.1)
Sodium: 137 mmol/L (ref 135–145)
TCO2: 28 mmol/L (ref 22–32)

## 2020-08-09 SURGERY — TURBT (TRANSURETHRAL RESECTION OF BLADDER TUMOR)
Anesthesia: General | Site: Bladder

## 2020-08-09 MED ORDER — FENTANYL CITRATE (PF) 100 MCG/2ML IJ SOLN: 25.0000 ug | INTRAMUSCULAR | Status: DC | PRN

## 2020-08-09 MED ORDER — SODIUM CHLORIDE 0.9 % IV SOLN: INTRAVENOUS | Status: DC

## 2020-08-09 MED ORDER — TRAMADOL HCL 50 MG PO TABS: 50.0000 mg | ORAL_TABLET | Freq: Four times a day (QID) | ORAL | 0 refills | Status: DC | PRN

## 2020-08-09 MED ORDER — SUGAMMADEX SODIUM 200 MG/2ML IV SOLN
INTRAVENOUS | Status: DC | PRN
  Administered 2020-08-09: 200 mg via INTRAVENOUS

## 2020-08-09 MED ORDER — ROCURONIUM BROMIDE 10 MG/ML (PF) SYRINGE
PREFILLED_SYRINGE | INTRAVENOUS | Status: AC
  Filled 2020-08-09: qty 10

## 2020-08-09 MED ORDER — BELLADONNA ALKALOIDS-OPIUM 16.2-60 MG RE SUPP
RECTAL | Status: DC | PRN
  Administered 2020-08-09: 1 via RECTAL

## 2020-08-09 MED ORDER — ONDANSETRON HCL 4 MG/2ML IJ SOLN
INTRAMUSCULAR | Status: DC | PRN
  Administered 2020-08-09: 4 mg via INTRAVENOUS

## 2020-08-09 MED ORDER — OXYCODONE HCL 5 MG PO TABS: 2.5000 mg | ORAL_TABLET | Freq: Once | ORAL | Status: DC | PRN

## 2020-08-09 MED ORDER — CIPROFLOXACIN IN D5W 400 MG/200ML IV SOLN
INTRAVENOUS | Status: DC | PRN
  Administered 2020-08-09: 400 mg via INTRAVENOUS

## 2020-08-09 MED ORDER — PHENYLEPHRINE 40 MCG/ML (10ML) SYRINGE FOR IV PUSH (FOR BLOOD PRESSURE SUPPORT)
PREFILLED_SYRINGE | INTRAVENOUS | Status: DC | PRN
  Administered 2020-08-09 (×2): 80 ug via INTRAVENOUS
  Administered 2020-08-09 (×2): 40 ug via INTRAVENOUS
  Administered 2020-08-09: 80 ug via INTRAVENOUS
  Administered 2020-08-09: 120 ug via INTRAVENOUS
  Administered 2020-08-09: 40 ug via INTRAVENOUS

## 2020-08-09 MED ORDER — FENTANYL CITRATE (PF) 100 MCG/2ML IJ SOLN
INTRAMUSCULAR | Status: DC | PRN
  Administered 2020-08-09 (×5): 25 ug via INTRAVENOUS

## 2020-08-09 MED ORDER — DEXAMETHASONE SODIUM PHOSPHATE 4 MG/ML IJ SOLN
INTRAMUSCULAR | Status: DC | PRN
  Administered 2020-08-09: 5 mg via INTRAVENOUS

## 2020-08-09 MED ORDER — PHENYLEPHRINE HCL (PRESSORS) 10 MG/ML IV SOLN: INTRAVENOUS | Status: DC | PRN

## 2020-08-09 MED ORDER — PHENYLEPHRINE 40 MCG/ML (10ML) SYRINGE FOR IV PUSH (FOR BLOOD PRESSURE SUPPORT)
PREFILLED_SYRINGE | INTRAVENOUS | Status: AC
  Filled 2020-08-09: qty 10

## 2020-08-09 MED ORDER — SODIUM CHLORIDE 0.9 % IR SOLN
Status: DC | PRN
  Administered 2020-08-09 (×3): 3000 mL via INTRAVESICAL
  Administered 2020-08-09: 9000 mL via INTRAVESICAL
  Administered 2020-08-09: 6000 mL via INTRAVESICAL

## 2020-08-09 MED ORDER — FENTANYL CITRATE (PF) 100 MCG/2ML IJ SOLN
INTRAMUSCULAR | Status: AC
  Filled 2020-08-09: qty 2

## 2020-08-09 MED ORDER — PROPOFOL 10 MG/ML IV BOLUS
INTRAVENOUS | Status: DC | PRN
  Administered 2020-08-09: 40 mg via INTRAVENOUS
  Administered 2020-08-09: 100 mg via INTRAVENOUS

## 2020-08-09 MED ORDER — PROPOFOL 10 MG/ML IV BOLUS
INTRAVENOUS | Status: AC
  Filled 2020-08-09: qty 20

## 2020-08-09 MED ORDER — ONDANSETRON HCL 4 MG/2ML IJ SOLN: 4.0000 mg | Freq: Once | INTRAMUSCULAR | Status: DC | PRN

## 2020-08-09 MED ORDER — ROCURONIUM BROMIDE 100 MG/10ML IV SOLN
INTRAVENOUS | Status: DC | PRN
  Administered 2020-08-09 (×2): 10 mg via INTRAVENOUS
  Administered 2020-08-09: 40 mg via INTRAVENOUS

## 2020-08-09 MED ORDER — LIDOCAINE HCL (CARDIAC) PF 100 MG/5ML IV SOSY
PREFILLED_SYRINGE | INTRAVENOUS | Status: DC | PRN
  Administered 2020-08-09: 40 mg via INTRAVENOUS

## 2020-08-09 MED ORDER — BELLADONNA ALKALOIDS-OPIUM 16.2-60 MG RE SUPP
RECTAL | Status: AC
  Filled 2020-08-09: qty 1

## 2020-08-09 MED ORDER — DEXAMETHASONE SODIUM PHOSPHATE 10 MG/ML IJ SOLN
INTRAMUSCULAR | Status: AC
  Filled 2020-08-09: qty 1

## 2020-08-09 MED ORDER — CIPROFLOXACIN IN D5W 400 MG/200ML IV SOLN
INTRAVENOUS | Status: AC
  Filled 2020-08-09: qty 200

## 2020-08-09 MED ORDER — CIPROFLOXACIN IN D5W 400 MG/200ML IV SOLN: 400.0000 mg | Freq: Two times a day (BID) | INTRAVENOUS | Status: DC

## 2020-08-09 MED ORDER — ONDANSETRON HCL 4 MG/2ML IJ SOLN
INTRAMUSCULAR | Status: AC
  Filled 2020-08-09: qty 2

## 2020-08-09 MED ORDER — LIDOCAINE HCL (PF) 2 % IJ SOLN
INTRAMUSCULAR | Status: AC
  Filled 2020-08-09: qty 5

## 2020-08-09 MED ORDER — KETOROLAC TROMETHAMINE 30 MG/ML IJ SOLN
INTRAMUSCULAR | Status: AC
  Filled 2020-08-09: qty 1

## 2020-08-09 SURGICAL SUPPLY — 32 items
BAG DRAIN URO-CYSTO SKYTR STRL (DRAIN) ×3 IMPLANT
BAG DRN RND TRDRP ANRFLXCHMBR (UROLOGICAL SUPPLIES) ×1
BAG DRN UROCATH (DRAIN) ×1
BAG URINE DRAIN 2000ML AR STRL (UROLOGICAL SUPPLIES) ×3 IMPLANT
BASKET LASER NITINOL 1.9FR (BASKET) IMPLANT
BSKT STON RTRVL 120 1.9FR (BASKET)
CATH FOLEY 3WAY 30CC 22FR (CATHETERS) ×3 IMPLANT
CATH URET 5FR 28IN OPEN ENDED (CATHETERS) ×3 IMPLANT
CATH URET DUAL LUMEN 6-10FR 50 (CATHETERS) IMPLANT
CLOTH BEACON ORANGE TIMEOUT ST (SAFETY) ×3 IMPLANT
ELECT REM PT RETURN 9FT ADLT (ELECTROSURGICAL)
ELECTRODE REM PT RTRN 9FT ADLT (ELECTROSURGICAL) IMPLANT
EVACUATOR MICROVAS BLADDER (UROLOGICAL SUPPLIES) IMPLANT
EXTRACTOR STONE 1.7FRX115CM (UROLOGICAL SUPPLIES) IMPLANT
GLOVE BIO SURGEON STRL SZ7.5 (GLOVE) ×3 IMPLANT
GOWN STRL REUS W/ TWL XL LVL3 (GOWN DISPOSABLE) ×2 IMPLANT
GOWN STRL REUS W/TWL XL LVL3 (GOWN DISPOSABLE) ×9 IMPLANT
GUIDEWIRE STR DUAL SENSOR (WIRE) ×3 IMPLANT
HOLDER FOLEY CATH W/STRAP (MISCELLANEOUS) IMPLANT
IV NS IRRIG 3000ML ARTHROMATIC (IV SOLUTION) ×30 IMPLANT
KIT TURNOVER CYSTO (KITS) ×3 IMPLANT
LOOP CUT BIPOLAR 24F LRG (ELECTROSURGICAL) ×3 IMPLANT
MANIFOLD NEPTUNE II (INSTRUMENTS) ×3 IMPLANT
NS IRRIG 500ML POUR BTL (IV SOLUTION) ×3 IMPLANT
PACK CYSTO (CUSTOM PROCEDURE TRAY) ×3 IMPLANT
SYR 30ML LL (SYRINGE) IMPLANT
TRACTIP FLEXIVA PULS ID 200XHI (Laser) IMPLANT
TRACTIP FLEXIVA PULSE ID 200 (Laser)
TUBE CONNECTING 12'X1/4 (SUCTIONS)
TUBE CONNECTING 12X1/4 (SUCTIONS) IMPLANT
TUBING UROLOGY SET (TUBING) ×3 IMPLANT
WATER STERILE IRR 500ML POUR (IV SOLUTION) ×3 IMPLANT

## 2020-08-09 NOTE — Transfer of Care (Signed)
Immediate Anesthesia Transfer of Care Note  Patient: Matthew Gray  Procedure(s) Performed: Procedure(s) (LRB): TRANSURETHRAL RESECTION OF BLADDER TUMOR (TURBT) (N/A) CYSTOSCOPY WITH BILATERAL RETROGRADE PYELOGRAM (N/A)  Patient Location: PACU  Anesthesia Type: General  Level of Consciousness: awake, sedated, patient cooperative and responds to stimulation  Airway & Oxygen Therapy: Patient Spontanous Breathing and Patient connected to [place] 02 and soft FM   Post-op Assessment: Report given to PACU RN, Post -op Vital signs reviewed and stable and Patient moving all extremities  Post vital signs: Reviewed and stable  Complications: No apparent anesthesia complications

## 2020-08-09 NOTE — Op Note (Signed)
Preoperative diagnosis:  1. Bladder cancer anterior wall  Postoperative diagnosis:  1. Same  Procedure: 1. Transurethral resection of bladder tumor, greater than 5 cm (approximately 8 cm total)  Surgeon: Ardis Hughs, MD  Anesthesia: General  Complications: None  Intraoperative findings:  The patient had severely trabeculated bladder making finding the ureteral orifice ease quite difficult.  As such, no retrograde pyelograms were performed.  The tumor began at the 7o'clock position laterally on the right and extended anteriorly all the way over to the 3 o'clock position on the left.  It did appear to be muscle invasive.  The tumor was resected in its entirety.  Somewhat complex because of the trabeculations within and around the tumor.  Hemostasis was excellent.  EBL: 200 cc  Specimens: Bladder tumor  Indication: Matthew Gray is a 86 y.o. patient with history of gross hematuria whom on further work-up was found to have a large bladder tumor.  After reviewing the management options for treatment, he elected to proceed with the above surgical procedure(s). We have discussed the potential benefits and risks of the procedure, side effects of the proposed treatment, the likelihood of the patient achieving the goals of the procedure, and any potential problems that might occur during the procedure or recuperation. Informed consent has been obtained.  Description of procedure:  The patient was taken to the operating room and general anesthesia was induced.  The patient was placed in the dorsal lithotomy position, prepped and draped in the usual sterile fashion, and preoperative antibiotics were administered. A preoperative time-out was performed.   21 French 2 degrees cystoscope was gently passed to the patient's urethra and the bladder under visual guidance.  Cystoscopy was performed with the above findings.  I then exchanged the 21 degree lens for the 70 degree lens and repeated the  cystoscopy.  I was unable to really identify either of the ureteral orifice ease.  At this point I opted to skip this part and proceeded with a transurethral resection of his bladder tumor.  I did this by introducing a 26 French resectoscope sheath using the visual obturator.  I then exchanged for the loop element and resected the tumor in a systematic fashion from left to right.  There were several areas along the tumor where muscle was included in the specimen.  The tumor did appear to invade into the muscle.  There was some residual tumor at the base that was unwilling to resect given the patient's age and frailty.  He may need to be brought back, and this may be a staged operation.  However, all the visible tumor was removed.  Hemostasis was then achieved and there was no significant bleeding.  I did insert a 59 [place] three-way Foley catheter in the case.  There was no evidence of bleeding at the time of extubation.  The patient subsequently extubated return the PACU in stable condition.  Disposition: The Foley catheter is being removed in the PACU.  He is scheduled for follow-up in 2 weeks.  Ardis Hughs, M.D.

## 2020-08-09 NOTE — Discharge Instructions (Signed)
Transurethral Resection of Bladder Tumor (TURBT) or Bladder Biopsy   Definition:  Transurethral Resection of the Bladder Tumor is a surgical procedure used to diagnose and remove tumors within the bladder. TURBT is the most common treatment for early stage bladder cancer.  General instructions:     Your recent bladder surgery requires very little post hospital care but some definite precautions.  Despite the fact that no skin incisions were used, the area around the bladder incisions are raw and covered with scabs to promote healing and prevent bleeding. Certain precautions are needed to insure that the scabs are not disturbed over the next 2-4 weeks while the healing proceeds.  Because the raw surface inside your bladder and the irritating effects of urine you may expect frequency of urination and/or urgency (a stronger desire to urinate) and perhaps even getting up at night more often. This will usually resolve or improve slowly over the healing period. You may see some blood in your urine over the first 6 weeks. Do not be alarmed, even if the urine was clear for a while. Get off your feet and drink lots of fluids until clearing occurs. If you start to pass clots or don't improve call us.  Diet:  You may return to your normal diet immediately. Because of the raw surface of your bladder, alcohol, spicy foods, foods high in acid and drinks with caffeine may cause irritation or frequency and should be used in moderation. To keep your urine flowing freely and avoid constipation, drink plenty of fluids during the day (8-10 glasses). Tip: Avoid cranberry juice because it is very acidic.  Activity:  Your physical activity doesn't need to be restricted. However, if you are very active, you may see some blood in the urine. We suggest that you reduce your activity under the circumstances until the bleeding has stopped.  Bowels:  It is important to keep your bowels regular during the postoperative  period. Straining with bowel movements can cause bleeding. A bowel movement every other day is reasonable. Use a mild laxative if needed, such as milk of magnesia 2-3 tablespoons, or 2 Dulcolax tablets. Call if you continue to have problems. If you had been taking narcotics for pain, before, during or after your surgery, you may be constipated. Take a laxative if necessary.    Medication:  You should resume your pre-surgery medications unless told not to. In addition you may be given an antibiotic to prevent or treat infection. Antibiotics are not always necessary. All medication should be taken as prescribed until the bottles are finished unless you are having an unusual reaction to one of the drugs.      Post Anesthesia Home Care Instructions  Activity: Get plenty of rest for the remainder of the day. A responsible individual must stay with you for 24 hours following the procedure.  For the next 24 hours, DO NOT: -Drive a car -Paediatric nurse -Drink alcoholic beverages -Take any medication unless instructed by your physician -Make any legal decisions or sign important papers.  Meals: Start with liquid foods such as gelatin or soup. Progress to regular foods as tolerated. Avoid greasy, spicy, heavy foods. If nausea and/or vomiting occur, drink only clear liquids until the nausea and/or vomiting subsides. Call your physician if vomiting continues.  Special Instructions/Symptoms: Your throat may feel dry or sore from the anesthesia or the breathing tube placed in your throat during surgery. If this causes discomfort, gargle with warm salt water. The discomfort should disappear within 24  hours.  If you had a scopolamine patch placed behind your ear for the management of post- operative nausea and/or vomiting:  1. The medication in the patch is effective for 72 hours, after which it should be removed.  Wrap patch in a tissue and discard in the trash. Wash hands thoroughly with soap and  water. 2. You may remove the patch earlier than 72 hours if you experience unpleasant side effects which may include dry mouth, dizziness or visual disturbances. 3. Avoid touching the patch. Wash your hands with soap and water after contact with the patch.

## 2020-08-09 NOTE — Anesthesia Postprocedure Evaluation (Signed)
Anesthesia Post Note  Patient: Matthew Gray  Procedure(s) Performed: TRANSURETHRAL RESECTION OF BLADDER TUMOR (TURBT) (N/A Bladder) CYSTOSCOPY WITH BILATERAL RETROGRADE PYELOGRAM (N/A Bladder)     Patient location during evaluation: PACU Anesthesia Type: General Level of consciousness: awake and alert, oriented and patient cooperative Pain management: pain level controlled Vital Signs Assessment: post-procedure vital signs reviewed and stable Respiratory status: spontaneous breathing, nonlabored ventilation and respiratory function stable Cardiovascular status: blood pressure returned to baseline and stable Postop Assessment: no apparent nausea or vomiting Anesthetic complications: no   No complications documented.  Last Vitals:  Vitals:   08/09/20 1330 08/09/20 1345  BP: (!) 141/96 (!) 137/92  Pulse: 98   Resp: (!) 24 (!) 26  Temp:    SpO2: 98%     Last Pain:  Vitals:   08/09/20 1345  TempSrc:   PainSc: [place]

## 2020-08-09 NOTE — Anesthesia Procedure Notes (Signed)
Procedure Name: Intubation Date/Time: 08/09/2020 11:28 AM Performed by: Justice Rocher, CRNA Pre-anesthesia Checklist: Patient identified, Emergency Drugs available, Suction available, Patient being monitored and Timeout performed Patient Re-evaluated:Patient Re-evaluated prior to induction Oxygen Delivery Method: Circle system utilized Preoxygenation: Pre-oxygenation with 100% oxygen Induction Type: IV induction Ventilation: Mask ventilation without difficulty Laryngoscope Size: Mac and 4 Grade View: Grade II Tube type: Oral Tube size: 7.5 mm Number of attempts: 1 Airway Equipment and Method: Stylet and Oral airway Placement Confirmation: ETT inserted through vocal cords under direct vision,  positive ETCO2 and breath sounds checked- equal and bilateral Secured at: 23 cm Tube secured with: Tape Dental Injury: Teeth and Oropharynx as per pre-operative assessment  Comments: Intubation performed by Zenda Alpers CRNA

## 2020-08-09 NOTE — Anesthesia Preprocedure Evaluation (Addendum)
Anesthesia Evaluation  Patient identified by MRN, date of birth, ID band Patient awake    Reviewed: Allergy & Precautions, NPO status , Patient's Chart, lab work & pertinent test results  Airway Mallampati: I  TM Distance: >3 FB Neck ROM: Full    Dental  (+) Missing, Edentulous Upper,    Pulmonary asthma , pneumonia (05/21/20 2/2 covid),  COVID 04/2020 Chronic cough, multiple episodes of pneumonia even prior to COVID in Sept 2021   Pulmonary exam normal breath sounds clear to auscultation       Cardiovascular hypertension, Pt. on medications + CAD (cath 2002 non-obstructive ) and + Peripheral Vascular Disease  Normal cardiovascular exam Rhythm:Regular Rate:Normal  Echo 2013: nml    Neuro/Psych PSYCHIATRIC DISORDERS Anxiety Depression Dementia Peripheral neuropathy   Neuromuscular disease    GI/Hepatic negative GI ROS, Neg liver ROS,   Endo/Other  negative endocrine ROS  Renal/GU Renal InsufficiencyRenal diseaseBladder ca CKD 3  negative genitourinary   Musculoskeletal  (+) Arthritis , Osteoarthritis,    Abdominal   Peds  Hematology negative hematology ROS (+)   Anesthesia Other Findings   Reproductive/Obstetrics negative OB ROS                           Anesthesia Physical Anesthesia Plan  ASA: III  Anesthesia Plan: General   Post-op Pain Management:    Induction: Intravenous and Rapid sequence  PONV Risk Score and Plan: 3 and Ondansetron, Dexamethasone and Treatment may vary due to age or medical condition  Airway Management Planned: Oral ETT  Additional Equipment: None  Intra-op Plan:   Post-operative Plan: Extubation in OR  Informed Consent: I have reviewed the patients History and Physical, chart, labs and discussed the procedure including the risks, benefits and alternatives for the proposed anesthesia with the patient or authorized representative who has indicated  his/her understanding and acceptance.     Dental advisory given and Consent reviewed with POA  Plan Discussed with: CRNA  Anesthesia Plan Comments: (Daughter at bedside, anesthesia plan reviewed with her. Explained increased risk of postoperative cognitive dysfunction given significant pre-existing dementia and age. )       Anesthesia Quick Evaluation

## 2020-08-09 NOTE — H&P (Signed)
gross hematuria evaluation  HPI: Matthew Gray is a 86 year-old male patient who is here for further evaluation of gross hematuria.  He first noticed the blood approximately 04/25/2020. He has noticed blood daily.   The patient has had an abdominal ct scan within the last year. He has not been told that he has blood in his urine prior to this episode.   The patient has had progression of his voiding symptoms over the last 6 months including worsening incontinence. The patient denies any new or recent onset of back/flank pain or suprapubic pain.   The patient does not have a history of recurrent UTIs. They do not have a history of kidney stones. He has not been exposed to occupational hazards that may increase their risks for developing cancer. The patient has no family history of GU malignancy.   Patient presented to the emergency department with gross hematuria. A CT scan was performed in demonstrated heterogeneous enhancing bladder mass on the right lateral wall.   The patient has dementia, but is otherwise reasonably healthy. He has COPD and CAD, but asymptomatic. The patient had been treated earlier this spring for a atypical pneumonia.   The patient lives with his daughter who is his full-time caregiver, he goes to daycare during the day. The patient has a past working history of 35 years at [employer] dying [employer].     ALLERGIES: Ace Inhibitors atorvastatin Penicillin Simvastatin Zetia    MEDICATIONS: Acetaminophen 325 mg tablet  Amlodipine Besylate 5 mg tablet  Donepezil Hcl 10 mg tablet  Hydrocodone-Acetaminophen 7.5 mg-325 mg tablet  Ipratropium-Albuterol 0.5 mg-3 mg (2.5 mg base)/3 ml ampul for nebulization  Low Dose Aspirin Ec 81 mg tablet, delayed release  Multiple Vitamins-Minerals  Oxycodone Hcl 5 mg tablet  Promethazine-Dm 6.25 mg-15 mg/5 ml syrup  Quetiapine Fumarate 25 mg tablet     GU PSH: No GU PSH    NON-GU PSH: No Non-GU PSH    GU PMH: None   NON-GU PMH:  Asthma COPD Coronary Artery Disease Depression Hypercholesterolemia Hypertension Other idiopathic peripheral autonomic neuropathy Peripheral vascular disease Sciatica, left side Unspecified dementia without behavioral disturbance Vitamin D deficiency, unspecified    FAMILY HISTORY: Breast Cancer - Mother Hypertension - Sister Lung Cancer - Father   SOCIAL HISTORY: Marital Status: Divorced Preferred Language: English; Ethnicity: Not Hispanic Or Latino; Race: Black or African American Current Smoking Status: Patient does not smoke anymore. Has not smoked since 04/25/2010.   Tobacco Use Assessment Completed: Used Tobacco in last 30 days?    REVIEW OF SYSTEMS:    GU Review Male:   Patient reports frequent urination, get up at night to urinate, leakage of urine, stream starts and stops, and trouble starting your stream. Patient denies hard to postpone urination, burning/ pain with urination, have to strain to urinate , erection problems, and penile pain.  Gastrointestinal (Upper):   Patient denies nausea, vomiting, and indigestion/ heartburn.  Gastrointestinal (Lower):   Patient denies diarrhea and constipation.  Constitutional:   Patient denies fever, night sweats, weight loss, and fatigue.  Skin:   Patient denies skin rash/ lesion and itching.  Eyes:   Patient denies blurred vision and double vision.  Ears/ Nose/ Throat:   Patient denies sore throat and sinus problems.  Hematologic/Lymphatic:   Patient denies swollen glands and easy bruising.  Cardiovascular:   Patient denies leg swelling and chest pains.  Respiratory:   Patient denies cough and shortness of breath.  Endocrine:   Patient denies  excessive thirst.  Musculoskeletal:   Patient denies back pain and joint pain.  Neurological:   Patient denies headaches and dizziness.  Psychologic:   Patient denies depression and anxiety.   Notes: Gross hematuria, small clots, nocturia x3-4    VITAL SIGNS:      05/02/2020 11:15 AM   Weight 136 lb / 61.69 kg  Height 66 in / 167.64 cm  BP 155/96 mmHg  Pulse 81 /min  BMI 21.9 kg/m   GU PHYSICAL EXAMINATION:    Scrotum: No lesions. No edema. No cysts. No warts.  Epididymides: Right: no spermatocele, no masses, no cysts, no tenderness, no induration, no enlargement. Left: no spermatocele, no masses, no cysts, no tenderness, no induration, no enlargement.  Testes: No tenderness, no swelling, no enlargement left testes. No tenderness, no swelling, no enlargement right testes. Normal location left testes. Normal location right testes. No mass, no cyst, no varicocele, no hydrocele left testes. No mass, no cyst, no varicocele, no hydrocele right testes.  Urethral Meatus: Normal size. No lesion, no wart, no discharge, no polyp. Normal location.  Penis: Circumcised, no warts, no cracks. No dorsal Peyronie's plaques, no left corporal Peyronie's plaques, no right corporal Peyronie's plaques, no scarring, no warts. No balanitis, no meatal stenosis.   MULTI-SYSTEM PHYSICAL EXAMINATION:    Constitutional: Well-nourished. No physical deformities. Normally developed. Good grooming.  Neck: Neck symmetrical, not swollen. Normal tracheal position.  Respiratory: Normal breath sounds. No labored breathing, no use of accessory muscles.   Cardiovascular: Regular rate and rhythm. No murmur, no gallop. Normal temperature, normal extremity pulses, no swelling, no varicosities.   Lymphatic: No enlargement of neck, axillae, groin.  Skin: No paleness, no jaundice, no cyanosis. No lesion, no ulcer, no rash.  Neurologic / Psychiatric: Not oriented to time. Oriented to place, oriented to person. No depression, no anxiety, no agitation.   Gastrointestinal: No mass, no tenderness, no rigidity, non obese abdomen.  Eyes: Normal conjunctivae. Normal eyelids.  Ears, Nose, Mouth, and Throat: Left ear no scars, no lesions, no masses. Right ear no scars, no lesions, no masses. Nose no scars, no lesions, no  masses. Normal hearing. Normal lips.  Musculoskeletal: Normal gait and station of head and neck.     Complexity of Data:  Records Review:   Previous Doctor Records, Previous Hospital Records, Previous Patient Records  Urine Test Review:   Urinalysis  X-Ray Review: C.T. Abdomen/Pelvis: Reviewed Films. Discussed With Patient.     PROCEDURES:          Urinalysis w/Scope Dipstick Dipstick Cont'd Micro  Color: Amber Bilirubin: Neg mg/dL WBC/hpf: 0 - 5/hpf  Appearance: Turbid Ketones: Neg mg/dL RBC/hpf: >60/hpf  Specific Gravity: 1.025 Blood: 3+ ery/uL Bacteria: Few (10-25/hpf)  pH: 6.0 Protein: 2+ mg/dL Cystals: NS (Not Seen)  Glucose: Neg mg/dL Urobilinogen: 0.2 mg/dL Casts: NS (Not Seen)    Nitrites: Neg Trichomonas: Not Present    Leukocyte Esterase: 1+ leu/uL Mucous: Not Present      Epithelial Cells: 0 - 5/hpf      Yeast: NS (Not Seen)      Sperm: Not Present    ASSESSMENT:      ICD-10 Details  1 GU:   Gross hematuria - R31.0    PLAN:           Document Letter(s):  Created for Patient: Clinical Summary         Notes:   Patient has a large bladder tumor on the anterior aspect of his right bladder wall.  He had a large capacity bladder that appeared to be trabeculated, but visualization was limited due to hematuria. Fortunately, based on his CT scan he does not appear to have evidence of metastatic disease or invasion of the bladder tumor through the bladder wall.   I had a long conversation with the patient's daughter, I told her that I felt like we should go to the operating room in resect the tumor as this will help minimize morbidity associated with bladder cancer. If left alone the patient have ongoing bleeding in likely a ventral spread of his cancer to other parts of his body. The patient will need to be cleared by his primary care provider and may be even his pulmonologist depending on the advice from his PCP.   I discussed trans urethral section of bladder tumor with the  patient's daughter who had a good understanding of the procedure and given the circumstance was willing to proceed.

## 2020-08-10 ENCOUNTER — Encounter (HOSPITAL_BASED_OUTPATIENT_CLINIC_OR_DEPARTMENT_OTHER): Payer: Self-pay | Admitting: Urology

## 2020-08-10 LAB — SURGICAL PATHOLOGY

## 2020-08-20 ENCOUNTER — Other Ambulatory Visit: Payer: Self-pay | Admitting: Internal Medicine

## 2020-08-20 NOTE — Telephone Encounter (Signed)
Please refill as per office routine med refill policy (all routine meds refilled for 3 mo or monthly per pt preference up to one year from last visit, then month to month grace period for 3 mo, then further med refills will have to be denied)  

## 2020-08-23 DIAGNOSIS — C678 Malignant neoplasm of overlapping sites of bladder: Secondary | ICD-10-CM | POA: Diagnosis not present

## 2020-08-24 DIAGNOSIS — J449 Chronic obstructive pulmonary disease, unspecified: Secondary | ICD-10-CM | POA: Diagnosis not present

## 2020-09-04 ENCOUNTER — Other Ambulatory Visit: Payer: Self-pay | Admitting: Internal Medicine

## 2020-09-04 NOTE — Telephone Encounter (Signed)
Please refill as per office routine med refill policy (all routine meds refilled for 3 mo or monthly per pt preference up to one year from last visit, then month to month grace period for 3 mo, then further med refills will have to be denied)  

## 2020-09-06 ENCOUNTER — Telehealth: Payer: Self-pay | Admitting: Internal Medicine

## 2020-09-06 NOTE — Telephone Encounter (Signed)
LVM for pt to rtn my call to schedule AWV with [facility]. Please schedule appt if pt calls the office.  

## 2020-09-07 ENCOUNTER — Telehealth: Payer: Self-pay | Admitting: Internal Medicine

## 2020-09-07 NOTE — Telephone Encounter (Signed)
Not sure how to help except to suggest an OV for general check

## 2020-09-07 NOTE — Telephone Encounter (Signed)
Patients daughter [name] calling and Patient has been discharged from day care because he has been spitting on the floor and at other people, as well as other things. States patient is not doing this at home so she is trying to see whats going on and if she can get him back into daycare.  [phone]

## 2020-09-12 ENCOUNTER — Encounter: Payer: Self-pay | Admitting: Internal Medicine

## 2020-09-12 ENCOUNTER — Other Ambulatory Visit: Payer: Self-pay

## 2020-09-12 ENCOUNTER — Ambulatory Visit (INDEPENDENT_AMBULATORY_CARE_PROVIDER_SITE_OTHER): Payer: Medicare HMO | Admitting: Internal Medicine

## 2020-09-12 VITALS — BP 110/68 | HR 171 | Temp 98.1°F | Ht 68.0 in | Wt 110.0 lb

## 2020-09-12 DIAGNOSIS — R000 Tachycardia, unspecified: Secondary | ICD-10-CM | POA: Diagnosis not present

## 2020-09-12 DIAGNOSIS — R1310 Dysphagia, unspecified: Secondary | ICD-10-CM | POA: Diagnosis not present

## 2020-09-12 DIAGNOSIS — F0281 Dementia in other diseases classified elsewhere with behavioral disturbance: Secondary | ICD-10-CM | POA: Diagnosis not present

## 2020-09-12 DIAGNOSIS — J449 Chronic obstructive pulmonary disease, unspecified: Secondary | ICD-10-CM | POA: Diagnosis not present

## 2020-09-12 DIAGNOSIS — G309 Alzheimer's disease, unspecified: Secondary | ICD-10-CM | POA: Diagnosis not present

## 2020-09-12 DIAGNOSIS — R634 Abnormal weight loss: Secondary | ICD-10-CM | POA: Diagnosis not present

## 2020-09-12 DIAGNOSIS — I10 Essential (primary) hypertension: Secondary | ICD-10-CM

## 2020-09-12 NOTE — Assessment & Plan Note (Signed)
Stable, cont currennt med tx - duoneb   Current Outpatient Medications (Cardiovascular):  .  amLODipine (NORVASC) 5 MG tablet, TAKE 1 TABLET EVERY DAY  Current Outpatient Medications (Respiratory):  .  guaifenesin (ROBITUSSIN) 100 MG/5ML syrup, Take 200 mg by mouth 3 (three) times daily as needed for cough. Marland Kitchen  ipratropium-albuterol (DUONEB) 0.5-2.5 (3) MG/3ML SOLN, Take 3 mLs by nebulization every 6 (six) hours as needed (for cough or shortness of breath). (Patient taking differently: Take 3 mLs by nebulization daily. Do twice if needed) .  loratadine (CLARITIN) 10 MG tablet, Take 10 mg by mouth daily.  Current Outpatient Medications (Analgesics):  .  acetaminophen (TYLENOL) 325 MG tablet, Take 2 tablets (650 mg total) by mouth every 6 (six) hours as needed for mild pain (or Fever >/= 101). Marland Kitchen  aspirin EC 81 MG tablet, Take 1 tablet (81 mg total) by mouth daily. .  traMADol (ULTRAM) 50 MG tablet, Take 1-2 tablets (50-100 mg total) by mouth every 6 (six) hours as needed for moderate pain.   Current Outpatient Medications (Other):  .  donepezil (ARICEPT) 10 MG tablet, TAKE 1 TABLET EVERY DAY  AT  2:00 AM .  Multiple Vitamins-Minerals (ALIVE MENS ENERGY) TABS, Take 1 tablet by mouth daily. .  QUEtiapine (SEROQUEL) 25 MG tablet, 1 tab by mouth twice per day (Patient taking differently: Take 25 mg by mouth 2 (two) times daily.) .  Respiratory Therapy Supplies (FLUTTER) DEVI, 1 Device by Does not apply route as needed.

## 2020-09-12 NOTE — Patient Instructions (Signed)
You are given the work note today for Aug 09 2020 to hopeful back to work by Oct 02, 2019  OK to stay home from daycare for now  You will be contacted regarding the referral for: Gastroenterology for the swallowing problems, which is likely causing the spitting issue at daycare  Please continue all other medications as before, and refills have been done if requested.  Please have the pharmacy call with any other refills you may need.  Please keep your appointments with your specialists as you may have planned  Please make an Appointment to return in 3 months

## 2020-09-12 NOTE — Assessment & Plan Note (Signed)
elev HR noted at intake VS but resolved with rest and a brief time, ECG revewed and SR; will continue to follow

## 2020-09-12 NOTE — Assessment & Plan Note (Addendum)
I suspect now a mechanical obstruction esophageal most likely with recent wt loss, dysphagia to solids and spitting at daycare most likely related.  Appears to be grimacing much of the time but conts to deny pain.  Pt now for urgent referral to gi for likely need for EGD.  Will need to stay away from daycare for now as further spitting there will likely cause him to be dishcharged from there.

## 2020-09-12 NOTE — Assessment & Plan Note (Signed)
Overall stable, suspect spitting most likely related to his dysphagia cause.

## 2020-09-12 NOTE — Assessment & Plan Note (Signed)
Suspect most likely related to dsyphagia to solids and pcm; daughter declines further evaluatoin today including labs or imaging as he likely would not tolerate this

## 2020-09-12 NOTE — Progress Notes (Signed)
Established Patient Office Visit  Subjective:  Patient ID: Matthew Gray, male    DOB: 01-Sep-1933  Age: 87 y.o. MRN: [id number]         Chief Complaint: recent spitting on floor at daycare, dysphagia, weight loss, dementia, and tachycardia on arrival        HPI:  Matthew Gray is a 87 y.o. male here to f/u above with daughter currently living with him as priimary caretaker as he has no one else; she has not been able to work for [employer] since dec 16 and will need to try to get back to work at some point, but he requires essentially 24/7 care, and would require NH if not for her.  He has most recently gotten in trouble for behavior at the daytime adult daycare where he has many time been spitting on the floor recently for unclear reasons; does not tend to do this at home, but in the past several wks has also had worsening dysphagia to solids though she has been very attentive to at least his fluids and has not developed low volume.  Has lost significant wt however and she has noticed some pedal edema bilaterally as well.  Has lost over 10 lbs in the last 2 mo. Memory seems to be worsening as well but no other behavioral issues, though does sleep much of the time when not at daycare.   Note today elevated hr seen at intake VS up to 171 per staff, but done as pt walked in, and resolved at time of ecg. .        Wt Readings from Last 3 Encounters:  09/12/20 110 lb (49.9 kg)  08/09/20 116 lb 1.6 oz (52.7 kg)  06/11/20 120 lb (54.4 kg)   BP Readings from Last 3 Encounters:  09/12/20 110/68  08/09/20 (!) 149/93  06/11/20 130/82   Past Medical History:  Diagnosis Date  . Advanced dementia ([place]) 2014   followed by pcp---  previously had seen neurologist, dr Krista Blue  . Allergic rhinitis   . Bladder cancer [facility])    urologist--- dr Louis Meckel  . BPH (benign prostatic hyperplasia)   . Chronic cough    per pt daughter, pt has always had cough long before had covid   . CKD (chronic kidney disease), stage III  ([place])   . Congestion of respiratory tract    post covid ,  per pt daughter mostly in morning time after doing nebulizer and productive  . 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 Coronary artery disease    cardiac cath 08-11-2001 in epic,  very minimal nonobstructive cad and luminal irregularities, normal lvsf  . DDD (degenerative disc disease), lumbar   . Depression   . 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 Hematuria   . History of 2019 novel coronavirus disease (COVID-19) 05-21-2020 dx   hospitalized and discharged 05-27-2020, covid pneumonia, only on oxygen   . History of adenomatous polyp of colon   . History of recurrent pneumonia   . History of sepsis 10/2018   due to pneumonia  . History of vertebral compression fracture    L3  . Hyperlipidemia   . Hypertension    followed by pcp  . Left leg swelling    08-08-2020 per pt daughter , not red or does not feel warm  . Peripheral neuropathy   .  Peripheral vascular disease ([place])   . Urinary incontinence, mixed   . Wears dentures    upper   Past Surgical History:  Procedure Laterality Date  . CARDIAC CATHETERIZATION  08-11-2001  @[facility]    very minmial nonobstructive cad, normal lvsf  . CYSTOSCOPY W/ RETROGRADES N/A 08/09/2020   Procedure: CYSTOSCOPY WITH BILATERAL RETROGRADE PYELOGRAM;  Surgeon: Ardis Hughs, MD;  Location: [facility];  Service: Urology;  Laterality: N/A;  . NO PAST SURGERIES    . TRANSURETHRAL RESECTION OF BLADDER TUMOR N/A 08/09/2020   Procedure: TRANSURETHRAL RESECTION OF BLADDER TUMOR (TURBT);  Surgeon: Ardis Hughs, MD;  Location[facility];  Service: Urology;  Laterality: N/A;    reports that he has never smoked. He has never used smokeless tobacco. He reports that he does not drink alcohol and does not use drugs. family history  includes Alcohol abuse in an other family member; Anuerysm in his brother; Cancer in his father, mother, and another family member; Hypertension in his sister; Stroke in an other family member. Allergies  Allergen Reactions  . Atorvastatin Other (See Comments)    REACTION: myalgias  . Simvastatin Other (See Comments)    REACTION: myalgias  . Zetia [Ezetimibe] Other (See Comments)    weakness  . Ace Inhibitors Other (See Comments)    cough  . Penicillins Other (See Comments)    Unknown; Tolerated Ceftriaxone on 10/2018   Current Outpatient Medications on File Prior to Visit  Medication Sig Dispense Refill  . acetaminophen (TYLENOL) 325 MG tablet Take 2 tablets (650 mg total) by mouth every 6 (six) hours as needed for mild pain (or Fever >/= 101). 30 tablet 0  . amLODipine (NORVASC) 5 MG tablet TAKE 1 TABLET EVERY DAY 90 tablet 2  . aspirin EC 81 MG tablet Take 1 tablet (81 mg total) by mouth daily. 90 tablet 11  . donepezil (ARICEPT) 10 MG tablet TAKE 1 TABLET EVERY DAY  AT  2:00 AM 90 tablet 1  . guaifenesin (ROBITUSSIN) 100 MG/5ML syrup Take 200 mg by mouth 3 (three) times daily as needed for cough.    Marland Kitchen ipratropium-albuterol (DUONEB) 0.5-2.5 (3) MG/3ML SOLN Take 3 mLs by nebulization every 6 (six) hours as needed (for cough or shortness of breath). (Patient taking differently: Take 3 mLs by nebulization daily. Do twice if needed) 360 mL 3  . loratadine (CLARITIN) 10 MG tablet Take 10 mg by mouth daily.    . Multiple Vitamins-Minerals (ALIVE MENS ENERGY) TABS Take 1 tablet by mouth daily.    . QUEtiapine (SEROQUEL) 25 MG tablet 1 tab by mouth twice per day (Patient taking differently: Take 25 mg by mouth 2 (two) times daily.) 180 tablet 1  . Respiratory Therapy Supplies (FLUTTER) DEVI 1 Device by Does not apply route as needed. 1 each 0  . traMADol (ULTRAM) 50 MG tablet Take 1-2 tablets (50-100 mg total) by mouth every 6 (six) hours as needed for moderate pain. 15 tablet 0   No current  facility-administered medications on file prior to visit.        ROS:  All others reviewed and negative.  Objective        PE:  BP 110/68   Pulse (!) 171   Temp 98.1 F (36.7 C) (Oral)   Ht 5\' 8"  (1.727 m)   Wt 110 lb (49.9 kg)   SpO2 98%   BMI 16.73 kg/m  Constitutional: Pt appears in NAD               HENT: Head: NCAT.                Right Ear: External ear normal.                 Left Ear: External ear normal.                Eyes: . Pupils are equal, round, and reactive to light. Conjunctivae and EOM are normal               Nose: without d/c or deformity               Neck: Neck supple. Gross normal ROM               Cardiovascular: Normal rate and regular rhythm.                 Pulmonary/Chest: Effort normal and breath sounds without rales or wheezing.                Abd:  Soft, NT, ND, + BS, no organomegaly               Neurological: Pt is alert. At baseline orientation, motor grossly intact               Skin: Skin is warm. No rashes, no other new lesions, LE edema - trace pedal edema               Psychiatric: Pt behavior is normal without agitation   Assessment/Plan:  Matthew Gray is a 87 y.o. Black or African American [2] male with  has a past medical history of Advanced dementia ([place]) (2014), Allergic rhinitis, Bladder cancer ([place]), BPH (benign prostatic hyperplasia), Chronic cough, CKD (chronic kidney disease), stage III ([place]), Congestion of respiratory tract, COPD with asthma ([place]), Coronary artery disease, DDD (degenerative disc disease), lumbar, Depression, Encephalopathy due to COVID-19 virus (04/2020), Hematuria, History of 2019 novel coronavirus disease (COVID-19) (05-21-2020 dx), History of adenomatous polyp of colon, History of recurrent pneumonia, History of sepsis (10/2018), History of vertebral compression fracture, Hyperlipidemia, Hypertension, Left leg swelling, Peripheral neuropathy, Peripheral vascular disease ([place]), Urinary incontinence,  mixed, and Wears dentures.   Assessment Plan  See problem oriented assessment and plan Labs/data reviewed for each problem:  Micro: none  Cardiac tracings I have personally interpreted today:  ECG - NSR 96 , RBBB  Pertinent Radiological findings (summarize): none    Health Maintenance Due  Topic Date Due  . COVID-19 Vaccine (1) Never done  . INFLUENZA VACCINE  03/25/2020    There are no preventive care reminders to display for this patient.  Lab Results  Component Value Date   TSH 0.82 03/05/2020   Lab Results  Component Value Date   WBC 7.0 05/26/2020   HGB 11.2 (L) 08/09/2020   HCT 33.0 (L) 08/09/2020   MCV 79.1 (L) 05/26/2020   PLT 181 05/26/2020   Lab Results  Component Value Date   NA 137 08/09/2020   K 4.2 08/09/2020   CO2 28 06/11/2020   GLUCOSE 82 08/09/2020   BUN 16 08/09/2020   CREATININE 1.00 08/09/2020   BILITOT 0.7 05/26/2020   ALKPHOS 53 05/26/2020   AST 42 (H) 05/26/2020   ALT 27 05/26/2020   PROT 6.2 (L) 05/26/2020   ALBUMIN 2.6 (L) 05/26/2020   CALCIUM 8.8 06/11/2020   ANIONGAP 11 05/26/2020  GFR 65.68 06/11/2020   Lab Results  Component Value Date   CHOL 207 (H) 10/14/2018   Lab Results  Component Value Date   HDL 53.40 10/14/2018   Lab Results  Component Value Date   LDLCALC 135 (H) 10/14/2018   Lab Results  Component Value Date   TRIG 48 05/22/2020   Lab Results  Component Value Date   CHOLHDL 4 10/14/2018   Lab Results  Component Value Date   HGBA1C 5.2 03/05/2020      Assessment & Plan:   Problem List Items Addressed This Visit      High   Tachycardia    elev HR noted at intake VS but resolved with rest and a brief time, ECG revewed and SR; will continue to follow      Relevant Orders   EKG 12-Lead   Dysphagia - Primary    I suspect now a mechanical obstruction esophageal most likely with recent wt loss, dysphagia to solids and spitting at daycare most likely related.  Appears to be grimacing much of the  time but conts to deny pain.  Pt now for urgent referral to gi for likely need for EGD.  Will need to stay away from daycare for now as further spitting there will likely cause him to be dishcharged from there.        Relevant Orders   Ambulatory referral to Gastroenterology   Dementia, Alzheimer's, with behavior disturbance ([place]) (Chronic)    Overall stable, suspect spitting most likely related to his dysphagia cause.        COPD (chronic obstructive pulmonary disease) (H[facility]) (Chronic)    Stable, cont currennt med tx - duoneb   Current Outpatient Medications (Cardiovascular):  .  amLODipine (NORVASC) 5 MG tablet, TAKE 1 TABLET EVERY DAY  Current Outpatient Medications (Respiratory):  .  guaifenesin (ROBITUSSIN) 100 MG/5ML syrup, Take 200 mg by mouth 3 (three) times daily as needed for cough. Marland Kitchen  ipratropium-albuterol (DUONEB) 0.5-2.5 (3) MG/3ML SOLN, Take 3 mLs by nebulization every 6 (six) hours as needed (for cough or shortness of breath). (Patient taking differently: Take 3 mLs by nebulization daily. Do twice if needed) .  loratadine (CLARITIN) 10 MG tablet, Take 10 mg by mouth daily.  Current Outpatient Medications (Analgesics):  .  acetaminophen (TYLENOL) 325 MG tablet, Take 2 tablets (650 mg total) by mouth every 6 (six) hours as needed for mild pain (or Fever >/= 101). Marland Kitchen  aspirin EC 81 MG tablet, Take 1 tablet (81 mg total) by mouth daily. .  traMADol (ULTRAM) 50 MG tablet, Take 1-2 tablets (50-100 mg total) by mouth every 6 (six) hours as needed for moderate pain.   Current Outpatient Medications (Other):  .  donepezil (ARICEPT) 10 MG tablet, TAKE 1 TABLET EVERY DAY  AT  2:00 AM .  Multiple Vitamins-Minerals (ALIVE MENS ENERGY) TABS, Take 1 tablet by mouth daily. .  QUEtiapine (SEROQUEL) 25 MG tablet, 1 tab by mouth twice per day (Patient taking differently: Take 25 mg by mouth 2 (two) times daily.) .  Respiratory Therapy Supplies (FLUTTER) DEVI, 1 Device by Does not apply route  as needed.         Medium   Essential hypertension (Chronic)    BP Readings from Last 3 Encounters:  09/12/20 110/68  08/09/20 (!) 149/93  06/11/20 130/82   Stable, pt to continue medical treatment - norvasc   Current Outpatient Medications (Cardiovascular):  .  amLODipine (NORVASC) 5 MG tablet, TAKE 1 TABLET EVERY  DAY  Current Outpatient Medications (Respiratory):  .  guaifenesin (ROBITUSSIN) 100 MG/5ML syrup, Take 200 mg by mouth 3 (three) times daily as needed for cough. Marland Kitchen  ipratropium-albuterol (DUONEB) 0.5-2.5 (3) MG/3ML SOLN, Take 3 mLs by nebulization every 6 (six) hours as needed (for cough or shortness of breath). (Patient taking differently: Take 3 mLs by nebulization daily. Do twice if needed) .  loratadine (CLARITIN) 10 MG tablet, Take 10 mg by mouth daily.  Current Outpatient Medications (Analgesics):  .  acetaminophen (TYLENOL) 325 MG tablet, Take 2 tablets (650 mg total) by mouth every 6 (six) hours as needed for mild pain (or Fever >/= 101). Marland Kitchen  aspirin EC 81 MG tablet, Take 1 tablet (81 mg total) by mouth daily. .  traMADol (ULTRAM) 50 MG tablet, Take 1-2 tablets (50-100 mg total) by mouth every 6 (six) hours as needed for moderate pain.   Current Outpatient Medications (Other):  .  donepezil (ARICEPT) 10 MG tablet, TAKE 1 TABLET EVERY DAY  AT  2:00 AM .  Multiple Vitamins-Minerals (ALIVE MENS ENERGY) TABS, Take 1 tablet by mouth daily. .  QUEtiapine (SEROQUEL) 25 MG tablet, 1 tab by mouth twice per day (Patient taking differently: Take 25 mg by mouth 2 (two) times daily.) .  Respiratory Therapy Supplies (FLUTTER) DEVI, 1 Device by Does not apply route as needed.         Low   Weight loss   Relevant Orders   Ambulatory referral to Gastroenterology      No orders of the defined types were placed in this encounter.   Follow-up: Return in about 3 months (around 12/11/2020).   [name], MD 09/12/2020 4:44 PM [facility]

## 2020-09-12 NOTE — Assessment & Plan Note (Signed)
BP Readings from Last 3 Encounters:  09/12/20 110/68  08/09/20 (!) 149/93  06/11/20 130/82   Stable, pt to continue medical treatment - norvasc   Current Outpatient Medications (Cardiovascular):  .  amLODipine (NORVASC) 5 MG tablet, TAKE 1 TABLET EVERY DAY  Current Outpatient Medications (Respiratory):  .  guaifenesin (ROBITUSSIN) 100 MG/5ML syrup, Take 200 mg by mouth 3 (three) times daily as needed for cough. Marland Kitchen  ipratropium-albuterol (DUONEB) 0.5-2.5 (3) MG/3ML SOLN, Take 3 mLs by nebulization every 6 (six) hours as needed (for cough or shortness of breath). (Patient taking differently: Take 3 mLs by nebulization daily. Do twice if needed) .  loratadine (CLARITIN) 10 MG tablet, Take 10 mg by mouth daily.  Current Outpatient Medications (Analgesics):  .  acetaminophen (TYLENOL) 325 MG tablet, Take 2 tablets (650 mg total) by mouth every 6 (six) hours as needed for mild pain (or Fever >/= 101). Marland Kitchen  aspirin EC 81 MG tablet, Take 1 tablet (81 mg total) by mouth daily. .  traMADol (ULTRAM) 50 MG tablet, Take 1-2 tablets (50-100 mg total) by mouth every 6 (six) hours as needed for moderate pain.   Current Outpatient Medications (Other):  .  donepezil (ARICEPT) 10 MG tablet, TAKE 1 TABLET EVERY DAY  AT  2:00 AM .  Multiple Vitamins-Minerals (ALIVE MENS ENERGY) TABS, Take 1 tablet by mouth daily. .  QUEtiapine (SEROQUEL) 25 MG tablet, 1 tab by mouth twice per day (Patient taking differently: Take 25 mg by mouth 2 (two) times daily.) .  Respiratory Therapy Supplies (FLUTTER) DEVI, 1 Device by Does not apply route as needed.

## 2020-09-17 ENCOUNTER — Telehealth: Payer: Self-pay | Admitting: Acute Care

## 2020-09-17 ENCOUNTER — Encounter: Payer: Self-pay | Admitting: Radiation Oncology

## 2020-09-17 MED ORDER — IPRATROPIUM-ALBUTEROL 0.5-2.5 (3) MG/3ML IN SOLN: 3.0000 mL | Freq: Four times a day (QID) | RESPIRATORY_TRACT | 3 refills | Status: AC | PRN

## 2020-09-17 MED ORDER — IPRATROPIUM-ALBUTEROL 0.5-2.5 (3) MG/3ML IN SOLN: 3.0000 mL | Freq: Four times a day (QID) | RESPIRATORY_TRACT | 3 refills | Status: DC | PRN

## 2020-09-17 NOTE — Progress Notes (Signed)
GU Location of Tumor / Histology: Muscle invasive bladder cancer  Matthew Gray presented with gross hematuria. Work up reveal a large bladder tumor.    Past/Anticipated interventions by urology, if any: TURB on 08/09/2020.  Past/Anticipated interventions by medical oncology, if any: no  Weight changes, if any: Down 10 lb in 2 months. Recently referred by PCP for stat EGD for dysphagia to solids and spitting at daycare.  Bowel/Bladder complaints, if any:    Nausea/Vomiting, if any:   Pain issues, if any:    SAFETY ISSUES: Prior radiation?  Pacemaker/ICD?  Possible current pregnancy? no, male patient Is the patient on methotrexate?   Current Complaints / other details:  87 year old. Daughter lives with patient. Daughter is patient's primary care provider. Patient requires full time care d/t advanced dementia.   Spoke with [name], daughter and primary caregiver. She explains her father fell last night and isn't doing well. She request his appointment with Dr. Tammi Klippel be rescheduled. Dr. Tammi Klippel, Freeman Caldron, PA-C and administrative staff notified.

## 2020-09-17 NOTE — Telephone Encounter (Signed)
Rx for Duoneb sent electronically to [facility] and printed as well to fax to number provided

## 2020-09-18 ENCOUNTER — Telehealth: Payer: Self-pay | Admitting: Radiation Oncology

## 2020-09-18 ENCOUNTER — Ambulatory Visit: Payer: Medicare HMO

## 2020-09-18 ENCOUNTER — Telehealth: Payer: Self-pay | Admitting: Internal Medicine

## 2020-09-18 ENCOUNTER — Ambulatory Visit
Admission: RE | Admit: 2020-09-18 | Discharge: 2020-09-18 | Disposition: A | Discharge status: Home / Self Care | Payer: Medicare HMO | Source: Ambulatory Visit | Attending: Radiation Oncology | Admitting: Radiation Oncology

## 2020-09-18 NOTE — Telephone Encounter (Signed)
Patients daughter called and said that the patient fell on 1.24.22. she said that he is not eating, weak and unsteady. Offered to transfer her to the triage line and she said she already knew they would tell her to call 911 and she doesn't want him to go to the ER with everything going on. Patient has been scheduled for a virtual on 1.26.22. Please call the daughter back at [phone].

## 2020-09-18 NOTE — Telephone Encounter (Signed)
Ok will see at virtual, but I am severely limited in being able to any assessment or urgent testing they would normally do in the ED, and we will probably maybe recommend he do there at the virtual visit as well to try determine his current status

## 2020-09-18 NOTE — Telephone Encounter (Signed)
Patient scheduled for consultation with Dr. Tammi Klippel to discuss radiation therapy in the management of bladder ca. Phoned patient's home. Spoke with daughter and Teaching laboratory technician, [name]. She explains her father fell last night and "isn't doing well." She explains she is on the other line with his PCP trying to get direction as to what to do. She went onto explain he isn't eating or holding his head up on his own. Offered to contact her later in the week for an update and to potentially reschedule this consult. She agreed. Informed Dr. Tammi Klippel and Freeman Caldron, PA-C of this finding.

## 2020-09-19 ENCOUNTER — Other Ambulatory Visit: Payer: Self-pay

## 2020-09-19 ENCOUNTER — Encounter (HOSPITAL_COMMUNITY): Payer: Self-pay

## 2020-09-19 ENCOUNTER — Emergency Department (HOSPITAL_COMMUNITY): Payer: Medicare HMO

## 2020-09-19 ENCOUNTER — Encounter: Payer: Self-pay | Admitting: Internal Medicine

## 2020-09-19 ENCOUNTER — Inpatient Hospital Stay (HOSPITAL_COMMUNITY)
Admission: EM | Admit: 2020-09-19 | Discharge: 2020-09-30 | DRG: 871 | Disposition: A | Discharge status: Hospice | Payer: Medicare HMO | Attending: Family Medicine | Admitting: Family Medicine

## 2020-09-19 ENCOUNTER — Ambulatory Visit (INDEPENDENT_AMBULATORY_CARE_PROVIDER_SITE_OTHER): Payer: Medicare HMO | Admitting: Internal Medicine

## 2020-09-19 DIAGNOSIS — Z7982 Long term (current) use of aspirin: Secondary | ICD-10-CM

## 2020-09-19 DIAGNOSIS — N182 Chronic kidney disease, stage 2 (mild): Secondary | ICD-10-CM | POA: Diagnosis present

## 2020-09-19 DIAGNOSIS — Z880 Allergy status to penicillin: Secondary | ICD-10-CM

## 2020-09-19 DIAGNOSIS — I739 Peripheral vascular disease, unspecified: Secondary | ICD-10-CM | POA: Diagnosis present

## 2020-09-19 DIAGNOSIS — G629 Polyneuropathy, unspecified: Secondary | ICD-10-CM | POA: Diagnosis present

## 2020-09-19 DIAGNOSIS — I2510 Atherosclerotic heart disease of native coronary artery without angina pectoris: Secondary | ICD-10-CM | POA: Diagnosis present

## 2020-09-19 DIAGNOSIS — E43 Unspecified severe protein-calorie malnutrition: Secondary | ICD-10-CM | POA: Diagnosis not present

## 2020-09-19 DIAGNOSIS — Z681 Body mass index (BMI) 19 or less, adult: Secondary | ICD-10-CM | POA: Diagnosis not present

## 2020-09-19 DIAGNOSIS — I700 Atherosclerosis of aorta: Secondary | ICD-10-CM | POA: Diagnosis present

## 2020-09-19 DIAGNOSIS — Z972 Presence of dental prosthetic device (complete) (partial): Secondary | ICD-10-CM

## 2020-09-19 DIAGNOSIS — J189 Pneumonia, unspecified organism: Secondary | ICD-10-CM

## 2020-09-19 DIAGNOSIS — D638 Anemia in other chronic diseases classified elsewhere: Secondary | ICD-10-CM | POA: Diagnosis present

## 2020-09-19 DIAGNOSIS — R627 Adult failure to thrive: Secondary | ICD-10-CM | POA: Diagnosis present

## 2020-09-19 DIAGNOSIS — D649 Anemia, unspecified: Secondary | ICD-10-CM

## 2020-09-19 DIAGNOSIS — J9601 Acute respiratory failure with hypoxia: Secondary | ICD-10-CM | POA: Diagnosis present

## 2020-09-19 DIAGNOSIS — J90 Pleural effusion, not elsewhere classified: Secondary | ICD-10-CM | POA: Diagnosis not present

## 2020-09-19 DIAGNOSIS — R109 Unspecified abdominal pain: Secondary | ICD-10-CM | POA: Diagnosis not present

## 2020-09-19 DIAGNOSIS — R4182 Altered mental status, unspecified: Secondary | ICD-10-CM | POA: Diagnosis not present

## 2020-09-19 DIAGNOSIS — Z66 Do not resuscitate: Secondary | ICD-10-CM | POA: Diagnosis not present

## 2020-09-19 DIAGNOSIS — E785 Hyperlipidemia, unspecified: Secondary | ICD-10-CM | POA: Diagnosis not present

## 2020-09-19 DIAGNOSIS — Z8551 Personal history of malignant neoplasm of bladder: Secondary | ICD-10-CM

## 2020-09-19 DIAGNOSIS — J4520 Mild intermittent asthma, uncomplicated: Secondary | ICD-10-CM | POA: Diagnosis present

## 2020-09-19 DIAGNOSIS — I129 Hypertensive chronic kidney disease with stage 1 through stage 4 chronic kidney disease, or unspecified chronic kidney disease: Secondary | ICD-10-CM | POA: Diagnosis present

## 2020-09-19 DIAGNOSIS — N179 Acute kidney failure, unspecified: Secondary | ICD-10-CM | POA: Diagnosis not present

## 2020-09-19 DIAGNOSIS — F0391 Unspecified dementia with behavioral disturbance: Secondary | ICD-10-CM | POA: Diagnosis present

## 2020-09-19 DIAGNOSIS — N3946 Mixed incontinence: Secondary | ICD-10-CM | POA: Diagnosis present

## 2020-09-19 DIAGNOSIS — M79605 Pain in left leg: Secondary | ICD-10-CM | POA: Diagnosis not present

## 2020-09-19 DIAGNOSIS — Z8616 Personal history of COVID-19: Secondary | ICD-10-CM

## 2020-09-19 DIAGNOSIS — Z7401 Bed confinement status: Secondary | ICD-10-CM | POA: Diagnosis not present

## 2020-09-19 DIAGNOSIS — C7951 Secondary malignant neoplasm of bone: Secondary | ICD-10-CM | POA: Diagnosis present

## 2020-09-19 DIAGNOSIS — R0902 Hypoxemia: Secondary | ICD-10-CM | POA: Diagnosis not present

## 2020-09-19 DIAGNOSIS — N1831 Chronic kidney disease, stage 3a: Secondary | ICD-10-CM | POA: Diagnosis not present

## 2020-09-19 DIAGNOSIS — I517 Cardiomegaly: Secondary | ICD-10-CM | POA: Diagnosis not present

## 2020-09-19 DIAGNOSIS — R739 Hyperglycemia, unspecified: Secondary | ICD-10-CM | POA: Diagnosis present

## 2020-09-19 DIAGNOSIS — J439 Emphysema, unspecified: Secondary | ICD-10-CM | POA: Diagnosis present

## 2020-09-19 DIAGNOSIS — J9811 Atelectasis: Secondary | ICD-10-CM | POA: Diagnosis not present

## 2020-09-19 DIAGNOSIS — R1310 Dysphagia, unspecified: Secondary | ICD-10-CM | POA: Diagnosis not present

## 2020-09-19 DIAGNOSIS — M2550 Pain in unspecified joint: Secondary | ICD-10-CM | POA: Diagnosis not present

## 2020-09-19 DIAGNOSIS — R0602 Shortness of breath: Secondary | ICD-10-CM | POA: Diagnosis not present

## 2020-09-19 DIAGNOSIS — R0689 Other abnormalities of breathing: Secondary | ICD-10-CM | POA: Diagnosis not present

## 2020-09-19 DIAGNOSIS — I723 Aneurysm of iliac artery: Secondary | ICD-10-CM | POA: Diagnosis present

## 2020-09-19 DIAGNOSIS — Z79899 Other long term (current) drug therapy: Secondary | ICD-10-CM

## 2020-09-19 DIAGNOSIS — D509 Iron deficiency anemia, unspecified: Secondary | ICD-10-CM | POA: Diagnosis not present

## 2020-09-19 DIAGNOSIS — R509 Fever, unspecified: Secondary | ICD-10-CM | POA: Diagnosis not present

## 2020-09-19 DIAGNOSIS — J690 Pneumonitis due to inhalation of food and vomit: Secondary | ICD-10-CM | POA: Diagnosis present

## 2020-09-19 DIAGNOSIS — Z888 Allergy status to other drugs, medicaments and biological substances status: Secondary | ICD-10-CM

## 2020-09-19 DIAGNOSIS — Z906 Acquired absence of other parts of urinary tract: Secondary | ICD-10-CM

## 2020-09-19 DIAGNOSIS — F32A Depression, unspecified: Secondary | ICD-10-CM | POA: Diagnosis present

## 2020-09-19 DIAGNOSIS — W19XXXA Unspecified fall, initial encounter: Secondary | ICD-10-CM | POA: Diagnosis present

## 2020-09-19 DIAGNOSIS — Z8249 Family history of ischemic heart disease and other diseases of the circulatory system: Secondary | ICD-10-CM

## 2020-09-19 DIAGNOSIS — C679 Malignant neoplasm of bladder, unspecified: Secondary | ICD-10-CM | POA: Diagnosis not present

## 2020-09-19 DIAGNOSIS — N400 Enlarged prostate without lower urinary tract symptoms: Secondary | ICD-10-CM | POA: Diagnosis present

## 2020-09-19 DIAGNOSIS — I10 Essential (primary) hypertension: Secondary | ICD-10-CM | POA: Diagnosis present

## 2020-09-19 DIAGNOSIS — J449 Chronic obstructive pulmonary disease, unspecified: Secondary | ICD-10-CM | POA: Diagnosis not present

## 2020-09-19 DIAGNOSIS — N189 Chronic kidney disease, unspecified: Secondary | ICD-10-CM | POA: Diagnosis present

## 2020-09-19 DIAGNOSIS — Z20822 Contact with and (suspected) exposure to covid-19: Secondary | ICD-10-CM | POA: Diagnosis present

## 2020-09-19 DIAGNOSIS — I719 Aortic aneurysm of unspecified site, without rupture: Secondary | ICD-10-CM | POA: Diagnosis present

## 2020-09-19 DIAGNOSIS — R404 Transient alteration of awareness: Secondary | ICD-10-CM | POA: Diagnosis not present

## 2020-09-19 DIAGNOSIS — Z86010 Personal history of colonic polyps: Secondary | ICD-10-CM

## 2020-09-19 DIAGNOSIS — A419 Sepsis, unspecified organism: Secondary | ICD-10-CM | POA: Diagnosis not present

## 2020-09-19 DIAGNOSIS — R531 Weakness: Secondary | ICD-10-CM | POA: Diagnosis not present

## 2020-09-19 LAB — COMPREHENSIVE METABOLIC PANEL
ALT: 14 U/L (ref 0–44)
AST: 17 U/L (ref 15–41)
Albumin: 2.6 g/dL — ABNORMAL LOW (ref 3.5–5.0)
Alkaline Phosphatase: 111 U/L (ref 38–126)
Anion gap: 13 (ref 5–15)
BUN: 29 mg/dL — ABNORMAL HIGH (ref 8–23)
CO2: 25 mmol/L (ref 22–32)
Calcium: 8.5 mg/dL — ABNORMAL LOW (ref 8.9–10.3)
Chloride: 98 mmol/L (ref 98–111)
Creatinine, Ser: 1.21 mg/dL (ref 0.61–1.24)
GFR, Estimated: 59 mL/min — ABNORMAL LOW (ref >60)
Glucose, Bld: 81 mg/dL (ref 70–99)
Potassium: 4.1 mmol/L (ref 3.5–5.1)
Sodium: 136 mmol/L (ref 135–145)
Total Bilirubin: 0.7 mg/dL (ref 0.3–1.2)
Total Protein: 6.5 g/dL (ref 6.5–8.1)

## 2020-09-19 LAB — LACTIC ACID, PLASMA
Lactic Acid, Venous: 1.8 mmol/L (ref 0.5–1.9)
Lactic Acid, Venous: 0.9 mmol/L (ref 0.5–1.9)

## 2020-09-19 LAB — TROPONIN I (HIGH SENSITIVITY)
Troponin I (High Sensitivity): 11 ng/L (ref <18)
Troponin I (High Sensitivity): 4 ng/L (ref <18)

## 2020-09-19 LAB — CBC WITH DIFFERENTIAL/PLATELET
Abs Immature Granulocytes: 0.39 10*3/uL — ABNORMAL HIGH (ref 0.00–0.07)
Basophils Absolute: 0.1 10*3/uL (ref 0.0–0.1)
Basophils Relative: 0 %
Eosinophils Absolute: 0 10*3/uL (ref 0.0–0.5)
Eosinophils Relative: 0 %
HCT: 28 % — ABNORMAL LOW (ref 39.0–52.0)
Hemoglobin: 8.5 g/dL — ABNORMAL LOW (ref 13.0–17.0)
Immature Granulocytes: 1 %
Lymphocytes Relative: 3 %
Lymphs Abs: 1.2 10*3/uL (ref 0.7–4.0)
MCH: 22.5 pg — ABNORMAL LOW (ref 26.0–34.0)
MCHC: 30.4 g/dL (ref 30.0–36.0)
MCV: 74.1 fL — ABNORMAL LOW (ref 80.0–100.0)
Monocytes Absolute: 0.9 10*3/uL (ref 0.1–1.0)
Monocytes Relative: 3 %
Neutro Abs: 33.4 10*3/uL — ABNORMAL HIGH (ref 1.7–7.7)
Neutrophils Relative %: 93 %
Platelets: 416 10*3/uL — ABNORMAL HIGH (ref 150–400)
RBC: 3.78 MIL/uL — ABNORMAL LOW (ref 4.22–5.81)
RDW: 16.8 % — ABNORMAL HIGH (ref 11.5–15.5)
WBC: 36 10*3/uL — ABNORMAL HIGH (ref 4.0–10.5)
nRBC: 0 % (ref 0.0–0.2)

## 2020-09-19 LAB — BRAIN NATRIURETIC PEPTIDE: B Natriuretic Peptide: 353.6 pg/mL — ABNORMAL HIGH (ref 0.0–100.0)

## 2020-09-19 LAB — POC OCCULT BLOOD, ED: Fecal Occult Bld: NEGATIVE

## 2020-09-19 LAB — SARS CORONAVIRUS 2 BY RT PCR (HOSPITAL ORDER, PERFORMED IN ~~LOC~~ HOSPITAL LAB): SARS Coronavirus 2: NEGATIVE

## 2020-09-19 MED ORDER — SODIUM CHLORIDE 0.9 % IV SOLN
500.0000 mg | Freq: Once | INTRAVENOUS | Status: AC
  Administered 2020-09-19: 500 mg via INTRAVENOUS
  Filled 2020-09-19: qty 500

## 2020-09-19 MED ORDER — SODIUM CHLORIDE 0.9 % IV SOLN
1.0000 g | Freq: Once | INTRAVENOUS | Status: AC
  Administered 2020-09-19: 1 g via INTRAVENOUS
  Filled 2020-09-19: qty 10

## 2020-09-19 NOTE — H&P (Signed)
History and Physical   Matthew Gray XBD:[id number] DOB: 06/29/1934 DOA: 09/19/2020  Referring MD/NP/PA: Dr. Melina Copa  PCP: Biagio Borg, MD   Outpatient Specialists: Dr. Krista Blue, neurology  Patient coming from: Home  Chief Complaint: Shortness of breath and cough  HPI: Matthew Gray is a 87 y.o. male with medical history significant of dementia, bladder cancer, history of BPH, chronic cough, chronic kidney disease stage III, COPD, coronary artery disease, degenerative disc disease and depression, who has had recurrent pneumonias over the last year. Patient also has had generalized weakness. He was recently admitted in late September after Covid and has had recurrent pneumonia also since then. He was seen at the PCPs office today due to generalized weakness generalized congestion as well as cough. He was found to be hypoxic and sent over to the ER. Daughter reported patient has not been eating adequately. He apparently has been having some dysphagia which has been going on for quite a while. He has gradually refused to eat mostly. He does have a lot of upper airway and mouth congestion. She has been clearing most of the phlegm for him. In the ER he was found to have right lower lobe pneumonia. Patient being admitted with recurrent pneumonia most likely aspiration pneumonia. No prior history of GI work-up. Daughter cannot recall any speech therapy evaluation in the past..  ED Course: Temperature 98.4 blood pressure 105/84 pulse 96 respirate 30 oxygen sat 70% on room air currently 100% on 2 L. His white count is 36,000 hemoglobin of 8.5 and platelets 416 chemistry largely within normal except for BUN 29 and calcium 8.5. Lactic acid is 1.8. COVID-19 screen is negative. Fecal occult blood testing also negative. Chest x-ray showed bibasilar consolidation right greater than left. There was small right pleural effusion and emphysema. Patient being admitted for further evaluation and treatment.  Review of Systems:  As per HPI otherwise 10 point review of systems negative.    Past Medical History:  Diagnosis Date  . Advanced dementia ([place]) 2014   followed by pcp---  previously had seen neurologist, dr Krista Blue  . Allergic rhinitis   . Bladder cancer [facility])    urologist--- dr Louis Meckel  . BPH (benign prostatic hyperplasia)   . Chronic cough    per pt daughter, pt has always had cough long before had covid   . CKD (chronic kidney disease), stage III ([place])   . Congestion of respiratory tract    post covid ,  per pt daughter mostly in morning time after doing nebulizer and productive  . COPD with asthma [facility])    pulmologist-- dr Ina Homes--  mild intermittant asthma--- hx byssinosis and recurrent pneumonia  . Coronary artery disease    cardiac cath 08-11-2001 in epic,  very minimal nonobstructive cad and luminal irregularities, normal lvsf  . DDD (degenerative disc disease), lumbar   . Depression   . 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 Hematuria   . History of 2019 novel coronavirus disease (COVID-19) 05-21-2020 dx   hospitalized and discharged 05-27-2020, covid pneumonia, only on oxygen   . History of adenomatous polyp of colon   . History of recurrent pneumonia   . History of sepsis 10/2018   due to pneumonia  . History of vertebral compression fracture    L3  . Hyperlipidemia   . Hypertension    followed by pcp  . Left leg swelling    08-08-2020 per pt daughter , not red  or does not feel warm  . Peripheral neuropathy   . Peripheral vascular disease ([place])   . Urinary incontinence, mixed   . Wears dentures    upper    Past Surgical History:  Procedure Laterality Date  . CARDIAC CATHETERIZATION  08-11-2001  _0    very minmial nonobstructive cad, normal lvsf  . CYSTOSCOPY W/ RETROGRADES N/A 08/09/2020   Procedure: CYSTOSCOPY WITH BILATERAL RETROGRADE PYELOGRAM;  Surgeon: Ardis Hughs, MD;  Location: [facility];  Service:  Urology;  Laterality: N/A;  . NO PAST SURGERIES    . TRANSURETHRAL RESECTION OF BLADDER TUMOR N/A 08/09/2020   Procedure: TRANSURETHRAL RESECTION OF BLADDER TUMOR (TURBT);  Surgeon: Ardis Hughs, MD;  Location: [facility];  Service: Urology;  Laterality: N/A;     reports that he has never smoked. He has never used smokeless tobacco. He reports that he does not drink alcohol and does not use drugs.  Allergies  Allergen Reactions  . Atorvastatin Other (See Comments)    REACTION: myalgias  . Simvastatin Other (See Comments)    REACTION: myalgias  . Zetia [Ezetimibe] Other (See Comments)    weakness  . Ace Inhibitors Other (See Comments)    cough  . Penicillins Other (See Comments)    Unknown; Tolerated Ceftriaxone on 10/2018    Family History  Problem Relation Age of Onset  . Cancer Mother        BREAST  . Breast cancer Mother   . Cancer Father        LUNG  . Lung cancer Father   . Hypertension Sister        3 sisters  . Anuerysm Brother        died with  . Stroke Other   . Cancer Other        lung and breast  . Alcohol abuse Other      Prior to Admission medications   Medication Sig Start Date End Date Taking? Authorizing Provider  acetaminophen (TYLENOL) 325 MG tablet Take 325 mg by mouth every 6 (six) hours as needed for moderate pain.   Yes [provider]  amLODipine (NORVASC) 5 MG tablet TAKE 1 TABLET EVERY DAY Patient taking differently: Take 5 mg by mouth daily. 08/21/20  Yes Biagio Borg, MD  aspirin EC 81 MG tablet Take 1 tablet (81 mg total) by mouth daily. 10/22/17  Yes Biagio Borg, MD  donepezil (ARICEPT) 10 MG tablet TAKE 1 TABLET EVERY DAY  AT  2:00 AM Patient taking differently: Take 10 mg by mouth daily. 09/04/20  Yes Biagio Borg, MD  Multiple Vitamins-Minerals (ALIVE MENS ENERGY) TABS Take 1 tablet by mouth daily.   Yes [provider]  QUEtiapine (SEROQUEL) 25 MG tablet 1 tab by mouth twice per day Patient  taking differently: Take 25 mg by mouth 2 (two) times daily. 03/16/20  Yes Biagio Borg, MD  acetaminophen (TYLENOL) 325 MG tablet Take 2 tablets (650 mg total) by mouth every 6 (six) hours as needed for mild pain (or Fever >/= 101). Patient not taking: Reported on 09/19/2020 11/05/18   Raiford Noble Latif, DO  ipratropium-albuterol (DUONEB) 0.5-2.5 (3) MG/3ML SOLN Take 3 mLs by nebulization every 6 (six) hours as needed (for cough or shortness of breath). Patient not taking: No sig reported 09/17/20   Martyn Ehrich, NP  Respiratory Therapy Supplies (FLUTTER) DEVI 1 Device by Does not apply route as needed. 09/05/19   Candee Furbish,  MD  traMADol (ULTRAM) 50 MG tablet Take 1-2 tablets (50-100 mg total) by mouth every 6 (six) hours as needed for moderate pain. Patient not taking: No sig reported 08/09/20   Ardis Hughs, MD    Physical Exam: Vitals:   09/19/20 1900 09/19/20 1915 09/19/20 1930 09/19/20 2015  BP: 105/84 107/82 112/78 114/78  Pulse: 93 89 92 92  Resp: (!) 26 (!) 21 18 (!) 30  Temp:      TempSrc:      SpO2: 99% 100% 97% 99%  Weight:      Height:          Constitutional: Frail, weak, no distress Vitals:   09/19/20 1900 09/19/20 1915 09/19/20 1930 09/19/20 2015  BP: 105/84 107/82 112/78 114/78  Pulse: 93 89 92 92  Resp: (!) 26 (!) 21 18 (!) 30  Temp:      TempSrc:      SpO2: 99% 100% 97% 99%  Weight:      Height:       Eyes: PERRL, lids and conjunctivae normal ENMT: Mucous membranes are dry. Posterior pharynx clear of any exudate or lesions.Normal dentition.  Neck: normal, supple, no masses, no thyromegaly Respiratory: Coarse breath sound bilaterally with rhonchi and mild crackles especially on the right normal respiratory effort. No accessory muscle use.  Cardiovascular: Regular rate and rhythm, no murmurs / rubs / gallops. No extremity edema. 2+ pedal pulses. No carotid bruits.  Abdomen: no tenderness, no masses palpated. No hepatosplenomegaly. Bowel  sounds positive.  Musculoskeletal: no clubbing / cyanosis. No joint deformity upper and lower extremities. Good ROM, no contractures. Normal muscle tone.  Skin: no rashes, lesions, ulcers. No induration, dry and coarse skin Neurologic: CN 2-12 grossly intact. Sensation intact, DTR normal. Strength 5/5 in all 4.  Psychiatric: Fully awake but weak and withdrawn.     Labs on Admission: I have personally reviewed following labs and imaging studies  CBC: Recent Labs  Lab 09/19/20 1724  WBC 36.0*  NEUTROABS 33.4*  HGB 8.5*  HCT 28.0*  MCV 74.1*  PLT 161*   Basic Metabolic Panel: Recent Labs  Lab 09/19/20 1724  NA 136  K 4.1  CL 98  CO2 25  GLUCOSE 81  BUN 29*  CREATININE 1.21  CALCIUM 8.5*   GFR: Estimated Creatinine Clearance: 31.5 mL/min (by C-G formula based on SCr of 1.21 mg/dL). Liver Function Tests: Recent Labs  Lab 09/19/20 1724  AST 17  ALT 14  ALKPHOS 111  BILITOT 0.7  PROT 6.5  ALBUMIN 2.6*   No results for input(s): LIPASE, AMYLASE in the last 168 hours. No results for input(s): AMMONIA in the last 168 hours. Coagulation Profile: No results for input(s): INR, PROTIME in the last 168 hours. Cardiac Enzymes: No results for input(s): CKTOTAL, CKMB, CKMBINDEX, TROPONINI in the last 168 hours. BNP (last 3 results) No results for input(s): PROBNP in the last 8760 hours. HbA1C: No results for input(s): HGBA1C in the last 72 hours. CBG: No results for input(s): GLUCAP in the last 168 hours. Lipid Profile: No results for input(s): CHOL, HDL, LDLCALC, TRIG, CHOLHDL, LDLDIRECT in the last 72 hours. Thyroid Function Tests: No results for input(s): TSH, T4TOTAL, FREET4, T3FREE, THYROIDAB in the last 72 hours. Anemia Panel: No results for input(s): VITAMINB12, FOLATE, FERRITIN, TIBC, IRON, RETICCTPCT in the last 72 hours. Urine analysis:    Component Value Date/Time   COLORURINE YELLOW 05/22/2020 0400   APPEARANCEUR HAZY (A) 05/22/2020 0400   LABSPEC 1.011  05/22/2020 0400   PHURINE 7.0 05/22/2020 0400   GLUCOSEU NEGATIVE 05/22/2020 0400   GLUCOSEU NEGATIVE 08/18/2019 1148   HGBUR LARGE (A) 05/22/2020 0400   BILIRUBINUR NEGATIVE 05/22/2020 0400   BILIRUBINUR neg 08/04/2019 1643   KETONESUR NEGATIVE 05/22/2020 0400   PROTEINUR 100 (A) 05/22/2020 0400   UROBILINOGEN 0.2 08/18/2019 1148   NITRITE NEGATIVE 05/22/2020 0400   LEUKOCYTESUR TRACE (A) 05/22/2020 0400   Sepsis Labs: _0 (procalcitonin:4,lacticidven:4) ) Recent Results (from the past 240 hour(s))  SARS Coronavirus 2 by RT PCR (hospital order, performed in [place] hospital lab) Nasopharyngeal Nasopharyngeal Swab     Status: None   Collection Time: 09/19/20  5:30 PM   Specimen: Nasopharyngeal Swab  Result Value Ref Range Status   SARS Coronavirus 2 NEGATIVE NEGATIVE Final    Comment: (NOTE) SARS-CoV-2 target nucleic acids are NOT DETECTED.  The SARS-CoV-2 RNA is generally detectable in upper and lower respiratory specimens during the acute phase of infection. The lowest concentration of SARS-CoV-2 viral copies this assay can detect is 250 copies / mL. A negative result does not preclude SARS-CoV-2 infection and should not be used as the sole basis for treatment or other patient management decisions.  A negative result may occur with improper specimen collection / handling, submission of specimen other than nasopharyngeal swab, presence of viral mutation(s) within the areas targeted by this assay, and inadequate number of viral copies (<250 copies / mL). A negative result must be combined with clinical observations, patient history, and epidemiological information.  Fact Sheet for Patients:   StrictlyIdeas.no  Fact Sheet for Healthcare Providers: BankingDealers.co.za  This test is not yet approved or  cleared by the [place] FDA and has been authorized for detection and/or diagnosis of SARS-CoV-2 by FDA under an  Emergency Use Authorization (EUA).  This EUA will remain in effect (meaning this test can be used) for the duration of the COVID-19 declaration under Section 564(b)(1) of the Act, 21 U.S.C. section 360bbb-3(b)(1), unless the authorization is terminated or revoked sooner.  Performed at [facility], [place] [address] [place], [place] [address]      Radiological Exams on Admission: DG Chest Port 1 View  Result Date: 09/19/2020 CLINICAL DATA:  [name] 2 days ago, PCP, COPD EXAM: PORTABLE CHEST 1 VIEW COMPARISON:  05/21/2020 FINDINGS: Single frontal view of the chest demonstrates an unremarkable cardiac silhouette. There is background emphysema, with bibasilar airspace disease greatest on the right. Small right pleural effusion is suspected. No pneumothorax. No acute bony abnormalities. IMPRESSION: 1. Bibasilar consolidation, right greater than left, favor airspace disease over atelectasis. 2. Small right pleural effusion. 3. Emphysema. Electronically Signed   By: Randa Ngo M.D.   On: 09/19/2020 17:24      Assessment/Plan Principal Problem:   Aspiration pneumonia (HCC) Active Problems:   Hyperlipidemia   Essential hypertension   COPD (chronic obstructive pulmonary disease) (HCC)   Acute renal failure superimposed on chronic kidney disease ([place])   Acute hypoxemic respiratory failure ([place])     #1 sepsis due to aspiration pneumonia: Patient will be admitted. Initiate treatment with IV antibiotics for aspiration pneumonitis. We will keep n.p.o. except for meds. Speech therapy evaluation. Daughter informed that patient may be having recurrent aspiration. He may have some dysphagia that may require GI evaluation if speech therapy evaluation did not show any results. Cultures will be obtained. Patient has met criteria for sepsis with leukocytosis and respiratory rate. Was hypoxic on arrival which could indicate severe sepsis but currently  improved  #2 COPD: No acute  exacerbation. We will continue to monitor.  #3 AKI on CKD 3: Continue to monitor creatinine.  #4 essential hypertension: Continue with home regimen.  #5 hyperlipidemia: Continue with statin.  #6 advanced dementia: Patient fully awake and alert and communicating but weak.  #7 normocytic anemia: Most likely due to chronic disease. Monitor H&H especially after hydration   DVT prophylaxis: Lovenox Code Status: Full code Family Communication: Daughter at bedside Disposition Plan: To be determined Consults called: None but speech therapy evaluation Admission status: Inpatient  Severity of Illness: The appropriate patient status for this patient is INPATIENT. Inpatient status is judged to be reasonable and necessary in order to provide the required intensity of service to ensure the patient's safety. The patient's presenting symptoms, physical exam findings, and initial radiographic and laboratory data in the context of their chronic comorbidities is felt to place them at high risk for further clinical deterioration. Furthermore, it is not anticipated that the patient will be medically stable for discharge from the hospital within 2 midnights of admission. The following factors support the patient status of inpatient.   " The patient's presenting symptoms include shortness of breath and cough. " The worrisome physical exam findings include frail old man with bilateral rhonchi. " The initial radiographic and laboratory data are worrisome because of basal pneumonia. " The chronic co-morbidities include recurrent pneumonias.   * I certify that at the point of admission it is my clinical judgment that the patient will require inpatient hospital care spanning beyond 2 midnights from the point of admission due to high intensity of service, high risk for further deterioration and high frequency of surveillance required.Barbette Merino MD [facility] Pager [phone]  If 7PM-7AM,  please contact night-coverage [email or web address] Password [facility]  09/19/2020, 9:36 PM

## 2020-09-19 NOTE — Assessment & Plan Note (Signed)
Improved on 2L, still mild tachycardic at 105, and I suspect aspirate PNA based on recent worsening dysphagia for unknown reason, wt loss and now abnormal BS on exam.  Pt now for EMS to take to [place].

## 2020-09-19 NOTE — Telephone Encounter (Signed)
Patient is coming into the office today. The daughter said that she has someone that can help her get the patient to the car and into the building.

## 2020-09-19 NOTE — Patient Instructions (Signed)
We will need to call EMS to go to [facility] hospital now

## 2020-09-19 NOTE — ED Triage Notes (Signed)
BIB EMS from PCP. Patient went to PCP due to weakness since fall 2 days ago. Hx of dementia. PCP called ems due to spo2 not working correctly.

## 2020-09-19 NOTE — ED Provider Notes (Signed)
[place] DEPT Provider Note   CSN: [id number] Arrival date & time: 09/19/20  [place]     History Chief Complaint  Patient presents with  . Weakness    Matthew Gray is a 87 y.o. male.  Level 5 caveat secondary to dementia.  Patient was brought in by EMS from PCPs office.  He was there for follow-up due to generalized weakness after a fall a few days ago.  PCP found him to be hypoxic.  PCP also comments upon some ongoing dysphagia to solids.  Cough.  Patient denies shortness of breath but appears.  History of recurrent pneumonia.  Recent weight loss.  Admitted for Covid in late September 21.  The history is provided by the patient, the EMS personnel and a relative.  Weakness Severity:  Moderate Onset quality:  Gradual Timing:  Constant Progression:  Worsening Relieved by:  Nothing Worsened by:  Activity Ineffective treatments:  Rest Associated symptoms: cough, difficulty walking and shortness of breath   Associated symptoms: no fever        Past Medical History:  Diagnosis Date  . Advanced dementia ([place]) 2014   followed by pcp---  previously had seen neurologist, dr Krista Blue  . Allergic rhinitis   . Bladder cancer [facility])    urologist--- dr Louis Meckel  . BPH (benign prostatic hyperplasia)   . Chronic cough    per pt daughter, pt has always had cough long before had covid   . CKD (chronic kidney disease), stage III ([place])   . Congestion of respiratory tract    post covid ,  per pt daughter mostly in morning time after doing nebulizer and productive  . 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 Coronary artery disease    cardiac cath 08-11-2001 in epic,  very minimal nonobstructive cad and luminal irregularities, normal lvsf  . DDD (degenerative disc disease), lumbar   . Depression   . 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  Hematuria   . History of 2019 novel coronavirus disease (COVID-19) 05-21-2020 dx   hospitalized and discharged 05-27-2020, covid pneumonia, only on oxygen   . History of adenomatous polyp of colon   . History of recurrent pneumonia   . History of sepsis 10/2018   due to pneumonia  . History of vertebral compression fracture    L3  . Hyperlipidemia   . Hypertension    followed by pcp  . Left leg swelling    08-08-2020 per pt daughter , not red or does not feel warm  . Peripheral neuropathy   . Peripheral vascular disease ([place])   . Urinary incontinence, mixed   . Wears dentures    upper    Patient Active Problem List   Diagnosis Date Noted  . Acute hypoxemic respiratory failure ([place]) 09/19/2020  . Dysphagia 09/12/2020  . Tachycardia 09/12/2020  . Acute renal failure superimposed on chronic kidney disease ([place]) 06/11/2020  . Pressure injury of skin 05/24/2020  . Pneumonia due to COVID-19 virus 05/22/2020  . Encephalopathy due to COVID-19 virus 05/22/2020  . Microhematuria 03/17/2020  . Acute bilateral low back pain without sciatica 03/05/2020  . Constipation 03/05/2020  . Weight loss, abnormal 03/05/2020  . Closed wedge compression fracture of third lumbar vertebra ([place]) 03/05/2020  . Pneumonia of right middle lobe due to infectious organism 03/05/2020  . Abnormal findings on diagnostic imaging  of lung 10/06/2019  . Urinary frequency 08/04/2019  . Community acquired pneumonia 11/02/2018  . Pneumonia 11/02/2018  . COPD exacerbation ([place]) 10/14/2018  . Influenza-like illness 10/04/2018  . Urinary incontinence 07/19/2018  . Fatigue 10/22/2017  . Hypokalemia 07/29/2017  . Driving safety issue 04/21/2017  . Acute right ankle pain 08/16/2016  . Bilateral hand pain 03/30/2016  . Bilateral hip pain 03/27/2016  . Loose stools 03/27/2016  . Low back pain 11/13/2015  . Cough 02/06/2015  . Weakness 07/19/2014  . Right inguinal hernia 03/04/2013  . Dementia, Alzheimer's, with  behavior disturbance ([place]) 01/13/2013  . Obstructive chronic bronchitis without exacerbation ([place])   . Polyneuropathy in other diseases classified elsewhere ([place])   . PVD (peripheral vascular disease) ([place]) 05/04/2012  . Peripheral neuropathy 03/08/2012  . Bilateral leg pain 03/08/2012  . Peripheral edema 09/10/2011  . Encounter for well adult exam with abnormal findings 11/22/2010  . Weight loss 11/22/2010  . Anxiety 11/22/2010  . PARESTHESIA 09/02/2010  . Vitamin D deficiency 06/04/2010  . Depression 11/30/2009  . ANKLE PAIN, RIGHT 11/30/2009  . FATIGUE 11/30/2009  . PERIPHERAL EDEMA 11/16/2009  . SCIATICA, LEFT 11/08/2009  . Backache 10/08/2009  . SHOULDER PAIN, LEFT 06/21/2009  . Hemoptysis 04/24/2009  . 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 MYALGIA 02/04/2008  . Pain in Soft Tissues of Limb 02/04/2008  . Coronary atherosclerosis 10/27/2007  . PERIPHERAL VASCULAR DISEASE 10/27/2007  . Allergic rhinitis 10/27/2007  . Asthma 10/27/2007  . COPD (chronic obstructive pulmonary disease) ([place]) 10/27/2007  . BENIGN PROSTATIC HYPERTROPHY 10/27/2007  . RASH-NONVESICULAR 10/27/2007  . COLONIC POLYPS, HX OF 10/27/2007  . Hyperlipidemia 04/03/2007  . Essential hypertension 04/03/2007    Past Surgical History:  Procedure Laterality Date  . CARDIAC CATHETERIZATION  08-11-2001  '@[facility]'    very minmial nonobstructive cad, normal lvsf  . CYSTOSCOPY W/ RETROGRADES N/A 08/09/2020   Procedure: CYSTOSCOPY WITH BILATERAL RETROGRADE PYELOGRAM;  Surgeon: Ardis Hughs, MD;  Location: [facility];  Service: Urology;  Laterality: N/A;  . NO PAST SURGERIES    . TRANSURETHRAL RESECTION OF BLADDER TUMOR N/A 08/09/2020   Procedure: TRANSURETHRAL RESECTION OF BLADDER TUMOR (TURBT);  Surgeon: Ardis Hughs, MD;  Location: [facility];  Service: Urology;  Laterality: N/A;       Family History  Problem Relation Age of Onset  . Cancer Mother        BREAST   . Breast cancer Mother   . Cancer Father        LUNG  . Lung cancer Father   . Hypertension Sister        3 sisters  . Anuerysm Brother        died with  . Stroke Other   . Cancer Other        lung and breast  . Alcohol abuse Other     Social History   Tobacco Use  . Smoking status: Never Smoker  . Smokeless tobacco: Never Used  Vaping Use  . Vaping Use: Never used  Substance Use Topics  . Alcohol use: No  . Drug use: Never    Home Medications Prior to Admission medications   Medication Sig Start Date End Date Taking? Authorizing Provider  acetaminophen (TYLENOL) 325 MG tablet Take 2 tablets (650 mg total) by mouth every 6 (six) hours as needed for mild pain (or Fever >/= 101). 11/05/18   Raiford Noble Latif, DO  amLODipine (NORVASC) 5 MG tablet TAKE 1 TABLET EVERY DAY 08/21/20  Biagio Borg, MD  aspirin EC 81 MG tablet Take 1 tablet (81 mg total) by mouth daily. 10/22/17   Biagio Borg, MD  dexamethasone (DECADRON) 2 MG tablet Take 2 mg by mouth 3 (three) times daily. 05/28/20   [provider]  donepezil (ARICEPT) 10 MG tablet TAKE 1 TABLET EVERY DAY  AT  2:00 AM 09/04/20   Biagio Borg, MD  guaifenesin (ROBITUSSIN) 100 MG/5ML syrup Take 200 mg by mouth 3 (three) times daily as needed for cough.    [provider]  ID NOW COVID-19 KIT USE 1 KIT TODAY AS DIRECTED 06/25/20   [provider]  ipratropium-albuterol (DUONEB) 0.5-2.5 (3) MG/3ML SOLN Take 3 mLs by nebulization every 6 (six) hours as needed (for cough or shortness of breath). 09/17/20   Martyn Ehrich, NP  loratadine (CLARITIN) 10 MG tablet Take 10 mg by mouth daily.    [provider]  Multiple Vitamins-Minerals (ALIVE MENS ENERGY) TABS Take 1 tablet by mouth daily.    [provider]  QUEtiapine (SEROQUEL) 25 MG tablet 1 tab by mouth twice per day Patient taking differently: Take 25 mg by mouth 2 (two) times daily. 03/16/20   Biagio Borg, MD  Respiratory Therapy  Supplies (FLUTTER) DEVI 1 Device by Does not apply route as needed. 09/05/19   Candee Furbish, MD  sulfamethoxazole-trimethoprim (BACTRIM DS) 800-160 MG tablet Take 1 tablet by mouth 2 (two) times daily. 08/06/20   [provider]  traMADol (ULTRAM) 50 MG tablet Take 1-2 tablets (50-100 mg total) by mouth every 6 (six) hours as needed for moderate pain. 08/09/20   Ardis Hughs, MD    Allergies    Atorvastatin, Simvastatin, Zetia [ezetimibe], Ace inhibitors, and Penicillins  Review of Systems   Review of Systems  Unable to perform ROS: Dementia  Constitutional: Negative for fever.  Respiratory: Positive for cough and shortness of breath.   Neurological: Positive for weakness.    Physical Exam Updated Vital Signs BP 123/83 (BP Location: Left Arm)   Pulse 89   Temp 97.7 F (36.5 C) (Oral)   Resp (!) 24   Ht '5\' 8"'  (1.727 m)   Wt 49 kg   SpO2 100%   BMI 16.43 kg/m   Physical Exam Vitals and nursing note reviewed.  Constitutional:      Appearance: He is well-developed and well-nourished. He is ill-appearing.  HENT:     Head: Normocephalic and atraumatic.  Eyes:     Conjunctiva/sclera: Conjunctivae normal.  Cardiovascular:     Rate and Rhythm: Normal rate and regular rhythm.     Heart sounds: No murmur heard.   Pulmonary:     Effort: Tachypnea and accessory muscle usage present. No respiratory distress.     Breath sounds: Transmitted upper airway sounds present. Rhonchi present.  Abdominal:     Palpations: Abdomen is soft.     Tenderness: There is no abdominal tenderness. There is no guarding or rebound.  Musculoskeletal:        General: No edema. Normal range of motion.     Cervical back: Neck supple.     Right lower leg: No edema.     Left lower leg: No edema.  Skin:    General: Skin is warm and dry.     Capillary Refill: Capillary refill takes less than 2 seconds.  Neurological:     General: No focal deficit present.     Mental Status: He is  alert.  Psychiatric:        Mood and Affect: Mood and affect normal.     ED Results / Procedures / Treatments   Labs (all labs ordered are listed, but only abnormal results are displayed) Labs Reviewed  COMPREHENSIVE METABOLIC PANEL - Abnormal; Notable for the following components:      Result Value   BUN 29 (*)    Calcium 8.5 (*)    Albumin 2.6 (*)    GFR, Estimated 59 (*)    All other components within normal limits  CBC WITH DIFFERENTIAL/PLATELET - Abnormal; Notable for the following components:   WBC 36.0 (*)    RBC 3.78 (*)    Hemoglobin 8.5 (*)    HCT 28.0 (*)    MCV 74.1 (*)    MCH 22.5 (*)    RDW 16.8 (*)    Platelets 416 (*)    Neutro Abs 33.4 (*)    Abs Immature Granulocytes 0.39 (*)    All other components within normal limits  BRAIN NATRIURETIC PEPTIDE - Abnormal; Notable for the following components:   B Natriuretic Peptide 353.6 (*)    All other components within normal limits  COMPREHENSIVE METABOLIC PANEL - Abnormal; Notable for the following components:   Potassium 3.4 (*)    Glucose, Bld 68 (*)    Calcium 7.7 (*)    Total Protein 5.7 (*)    Albumin 2.1 (*)    All other components within normal limits  CBC WITH DIFFERENTIAL/PLATELET - Abnormal; Notable for the following components:   WBC 35.4 (*)    RBC 3.50 (*)    Hemoglobin 7.8 (*)    HCT 25.1 (*)    MCV 71.7 (*)    MCH 22.3 (*)    RDW 16.9 (*)    Neutro Abs 33.2 (*)    Abs Immature Granulocytes 0.46 (*)    All other components within normal limits  SARS CORONAVIRUS 2 BY RT PCR (HOSPITAL ORDER, [facility])  CULTURE, BLOOD (ROUTINE X 2)  CULTURE, BLOOD (ROUTINE X 2)  LACTIC ACID, PLASMA  LACTIC ACID, PLASMA  URINALYSIS, ROUTINE W REFLEX MICROSCOPIC  POC OCCULT BLOOD, ED  TROPONIN I (HIGH SENSITIVITY)  TROPONIN I (HIGH SENSITIVITY)    EKG EKG Interpretation  Date/Time:  Wednesday September 19 2020 17:26:22 EST Ventricular Rate:  88 PR Interval:    QRS  Duration: 149 QT Interval:  431 QTC Calculation: 522 R Axis:   95 Text Interpretation: Sinus rhythm Right bundle branch block Artifact in lead(s) I III aVL V1 V3 No significant change since prior 9/21 Confirmed by Aletta Edouard [phone]) on 09/20/2020 11:23:19 AM   Radiology DG Chest Port 1 View  Result Date: 09/19/2020 CLINICAL DATA:  [name] 2 days ago, PCP, COPD EXAM: PORTABLE CHEST 1 VIEW COMPARISON:  05/21/2020 FINDINGS: Single frontal view of the chest demonstrates an unremarkable cardiac silhouette. There is background emphysema, with bibasilar airspace disease greatest on the right. Small right pleural effusion is suspected. No pneumothorax. No acute bony abnormalities. IMPRESSION: 1. Bibasilar consolidation, right greater than left, favor airspace disease over atelectasis. 2. Small right pleural effusion. 3. Emphysema. Electronically Signed   By: Randa Ngo M.D.   On: 09/19/2020 17:24    Procedures .Critical Care Performed by: Hayden Rasmussen, MD Authorized by: Hayden Rasmussen, MD   Critical care provider statement:    Critical care time (minutes):  45   Critical care time was exclusive of:  Separately billable procedures and  treating other patients   Critical care was necessary to treat or prevent imminent or life-threatening deterioration of the following conditions:  Respiratory failure and sepsis   Critical care was time spent personally by me on the following activities:  Discussions with consultants, evaluation of patient's response to treatment, examination of patient, ordering and performing treatments and interventions, ordering and review of laboratory studies, ordering and review of radiographic studies, pulse oximetry, re-evaluation of patient's condition, obtaining history from patient or surrogate, review of old charts and development of treatment plan with patient or surrogate     Medications Ordered in ED Medications  ipratropium-albuterol (DUONEB) 0.5-2.5 (3)  MG/3ML nebulizer solution 3 mL (has no administration in time range)  aspirin EC tablet 81 mg (0 mg Oral Hold 09/20/20 1044)  amLODipine (NORVASC) tablet 5 mg (0 mg Oral Hold 09/20/20 1045)  QUEtiapine (SEROQUEL) tablet 25 mg (0 mg Oral Hold 09/20/20 1045)  donepezil (ARICEPT) tablet 10 mg (0 mg Oral Hold 09/20/20 1045)  0.9 %  sodium chloride infusion ( Intravenous New Bag/Given 09/20/20 0947)  heparin injection 5,000 Units (0 Units Subcutaneous Hold 09/20/20 1044)  cefTRIAXone (ROCEPHIN) 1 g in sodium chloride 0.9 % 100 mL IVPB (0 g Intravenous Stopped 09/19/20 1934)  azithromycin (ZITHROMAX) 500 mg in sodium chloride 0.9 % 250 mL IVPB (0 mg Intravenous Stopped 09/19/20 2200)    ED Course  I have reviewed the triage vital signs and the nursing notes.  Pertinent labs & imaging results that were available during my care of the patient were reviewed by me and considered in my medical decision making (see chart for details).  Clinical Course as of 09/20/20 1122  Wed Sep 19, 2020  1727 Chest x-ray interpreted by me as probable infiltrate right base [MB]  1737 Initial pulse ox was reported to be 74% by EMS.  Patient hands are very cold and he was difficult to pick up.  The nurse was able to get him at 94% on room air. [MB]  2016 Discussed with Dr. Jonelle Sidle [facility] hospitalist will evaluate patient for admission. [MB]    Clinical Course User Index [MB] Hayden Rasmussen, MD   MDM Rules/Calculators/A&P                         Matthew Gray was evaluated in Emergency Department on 09/19/2020 for the symptoms described in the history of present illness. He was evaluated in the context of the global COVID-19 pandemic, which necessitated consideration that the patient might be at risk for infection with the SARS-CoV-2 virus that causes COVID-19. Institutional protocols and algorithms that pertain to the evaluation of patients at risk for COVID-19 are in a state of rapid change based on information released by  regulatory bodies including the CDC and federal and state organizations. These policies and algorithms were followed during the patient's care in the ED.  This patient complains of hypoxia shortness of breath generalized weakness; this involves an extensive number of treatment Options and is a complaint that carries with it a high risk of complications and Morbidity. The differential includes pneumonia, COVID, PE, sepsis, Sirs, metabolic derangement, anemia, pneumothorax  I ordered, reviewed and interpreted labs, which included CBC with markedly elevated white count,, hemoglobin lower than baseline, fecal occult negative, chemistries with no significant findings other than some evidence of some malnutrition lactic acid not elevated, BNP mildly elevated I ordered medication IV antibiotics I ordered imaging studies which included chest x-ray and I independently  visualized and interpreted imaging which showed right lower lobe infiltrate Additional history obtained from patient's daughter who is his caregiver Previous records obtained and reviewed in epic, no recent admissions I consulted Dr. Jonelle Sidle [facility] hospitalist and discussed lab and imaging findings  Critical Interventions: Evaluation of patient's abnormal vital signs SIRS criteria with early antibiotics and oxygen.  After the interventions stated above, I reevaluated the patient and found patient still to be requiring oxygen.  He will need to be admitted to the hospital for further management of his pneumonia and malnutrition.  Daughter in agreement with for admission.   Final Clinical Impression(s) / ED Diagnoses Final diagnoses:  Pneumonia of right lower lobe due to infectious organism  Anemia, unspecified type    Rx / DC Orders ED Discharge Orders    None       Hayden Rasmussen, MD 09/20/20 1128

## 2020-09-19 NOTE — Progress Notes (Signed)
Established Patient Office Visit  Subjective:  Patient ID: Matthew Gray, male    DOB: Oct 25, 1933  Age: 87 y.o. MRN: [id number]      Chief Complaint: here with daughter to f/u who is attentive and currently living him       HPI:  Matthew Gray is a 87 y.o. male here to f/u with c/o recent weakness and fall, and has sort of been favoring the LLE, but also with mild worsening swelling legs left > right since last seen 1/19, with ongoing dysphagia to solids for unclear reason (GI referral pending) and now with cough but per daughter no fever, chills, worsening sob/doe.  Was found on initial VS to have o2 sat 72%, now with 98% on 2L.  Has hx of recurrent pna, recent wt loss but no prior hx of aspiration. .        Wt Readings from Last 3 Encounters:  09/12/20 110 lb (49.9 kg)  08/09/20 116 lb 1.6 oz (52.7 kg)  06/11/20 120 lb (54.4 kg)   BP Readings from Last 3 Encounters:  09/12/20 110/68  08/09/20 (!) 149/93  06/11/20 130/82         Past Medical History:  Diagnosis Date  . Advanced dementia ([place]) 2014   followed by pcp---  previously had seen neurologist, dr Krista Blue  . Allergic rhinitis   . Bladder cancer [facility])    urologist--- dr Louis Meckel  . BPH (benign prostatic hyperplasia)   . Chronic cough    per pt daughter, pt has always had cough long before had covid   . CKD (chronic kidney disease), stage III ([place])   . Congestion of respiratory tract    post covid ,  per pt daughter mostly in morning time after doing nebulizer and productive  . 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 Coronary artery disease    cardiac cath 08-11-2001 in epic,  very minimal nonobstructive cad and luminal irregularities, normal lvsf  . DDD (degenerative disc disease), lumbar   . Depression   . 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 Hematuria   . History of 2019 novel coronavirus  disease (COVID-19) 05-21-2020 dx   hospitalized and discharged 05-27-2020, covid pneumonia, only on oxygen   . History of adenomatous polyp of colon   . History of recurrent pneumonia   . History of sepsis 10/2018   due to pneumonia  . History of vertebral compression fracture    L3  . Hyperlipidemia   . Hypertension    followed by pcp  . Left leg swelling    08-08-2020 per pt daughter , not red or does not feel warm  . Peripheral neuropathy   . Peripheral vascular disease ([place])   . Urinary incontinence, mixed   . Wears dentures    upper   Past Surgical History:  Procedure Laterality Date  . CARDIAC CATHETERIZATION  08-11-2001  '@[facility]'    very minmial nonobstructive cad, normal lvsf  . CYSTOSCOPY W/ RETROGRADES N/A 08/09/2020   Procedure: CYSTOSCOPY WITH BILATERAL RETROGRADE PYELOGRAM;  Surgeon: Ardis Hughs, MD;  Location: [facility];  Service: Urology;  Laterality: N/A;  . NO PAST SURGERIES    . TRANSURETHRAL RESECTION OF BLADDER TUMOR N/A 08/09/2020   Procedure: TRANSURETHRAL RESECTION OF BLADDER TUMOR (TURBT);  Surgeon: Ardis Hughs, MD;  Location: [facility];  Service: Urology;  Laterality: N/A;    reports that he has never smoked. He has never used smokeless tobacco. He reports that he does not drink alcohol and does not use drugs. family history includes Alcohol abuse in an other family member; Anuerysm in his brother; Breast cancer in his mother; Cancer in his father, mother, and another family member; Hypertension in his sister; Lung cancer in his father; Stroke in an other family member. Allergies  Allergen Reactions  . Atorvastatin Other (See Comments)    REACTION: myalgias  . Simvastatin Other (See Comments)    REACTION: myalgias  . Zetia [Ezetimibe] Other (See Comments)    weakness  . Ace Inhibitors Other (See Comments)    cough  . Penicillins Other (See Comments)    Unknown; Tolerated Ceftriaxone on 10/2018   Current  Outpatient Medications on File Prior to Visit  Medication Sig Dispense Refill  . acetaminophen (TYLENOL) 325 MG tablet Take 2 tablets (650 mg total) by mouth every 6 (six) hours as needed for mild pain (or Fever >/= 101). 30 tablet 0  . amLODipine (NORVASC) 5 MG tablet TAKE 1 TABLET EVERY DAY 90 tablet 2  . aspirin EC 81 MG tablet Take 1 tablet (81 mg total) by mouth daily. 90 tablet 11  . dexamethasone (DECADRON) 2 MG tablet Take 2 mg by mouth 3 (three) times daily.    Marland Kitchen donepezil (ARICEPT) 10 MG tablet TAKE 1 TABLET EVERY DAY  AT  2:00 AM 90 tablet 1  . guaifenesin (ROBITUSSIN) 100 MG/5ML syrup Take 200 mg by mouth 3 (three) times daily as needed for cough.    . ID NOW COVID-19 KIT USE 1 KIT TODAY AS DIRECTED    . ipratropium-albuterol (DUONEB) 0.5-2.5 (3) MG/3ML SOLN Take 3 mLs by nebulization every 6 (six) hours as needed (for cough or shortness of breath). 360 mL 3  . loratadine (CLARITIN) 10 MG tablet Take 10 mg by mouth daily.    . Multiple Vitamins-Minerals (ALIVE MENS ENERGY) TABS Take 1 tablet by mouth daily.    . QUEtiapine (SEROQUEL) 25 MG tablet 1 tab by mouth twice per day (Patient taking differently: Take 25 mg by mouth 2 (two) times daily.) 180 tablet 1  . Respiratory Therapy Supplies (FLUTTER) DEVI 1 Device by Does not apply route as needed. 1 each 0  . sulfamethoxazole-trimethoprim (BACTRIM DS) 800-160 MG tablet Take 1 tablet by mouth 2 (two) times daily.    . traMADol (ULTRAM) 50 MG tablet Take 1-2 tablets (50-100 mg total) by mouth every 6 (six) hours as needed for moderate pain. 15 tablet 0   No current facility-administered medications on file prior to visit.        ROS:  All others reviewed and negative.  Objective        PE:  Pulse 93   Temp 98.4 F (36.9 C) (Oral)   Ht '5\' 8"'  (1.727 m)   SpO2 (!) 72%   BMI 16.73 kg/m                 Constitutional: Pt appears in NAD, but weak, slumped in chair, will wake and respond to questions yes and no               HENT:  Head: NCAT.                Right Ear: External ear normal.                 Left Ear: External ear normal.  Eyes: . Pupils are equal, round, and reactive to light. Conjunctivae and EOM are normal               Nose: without d/c or deformity               Neck: Neck supple. Gross normal ROM               Cardiovascular: Normal rate and regular rhythm.                 Pulmonary/Chest: Effort normal and breath sounds congested some worse on the right > left, no wheezing               Abd:  Soft, NT, ND, + BS, no organomegaly               Neurological: Pt is alert. At baseline orientation, motor - moves all 4s               Skin: Skin is warm. No rashes, no other new lesions, LE edema - 1+ left > right               Psychiatric: Pt behavior is normal without agitation   Assessment/Plan:  Matthew Gray is a 87 y.o. Black or African American [2] male with  has a past medical history of Advanced dementia ([place]) (2014), Allergic rhinitis, Bladder cancer ([place]), BPH (benign prostatic hyperplasia), Chronic cough, CKD (chronic kidney disease), stage III ([place]), Congestion of respiratory tract, COPD with asthma ([place]), Coronary artery disease, DDD (degenerative disc disease), lumbar, Depression, Encephalopathy due to COVID-19 virus (04/2020), Hematuria, History of 2019 novel coronavirus disease (COVID-19) (05-21-2020 dx), History of adenomatous polyp of colon, History of recurrent pneumonia, History of sepsis (10/2018), History of vertebral compression fracture, Hyperlipidemia, Hypertension, Left leg swelling, Peripheral neuropathy, Peripheral vascular disease ([place]), Urinary incontinence, mixed, and Wears dentures.   Assessment Plan  See problem oriented assessment and plan Labs/data reviewed for each problem:  Micro: none  Cardiac tracings I have personally interpreted today:  none  Pertinent Radiological findings (summarize): none    Health Maintenance Due  Topic Date Due  . COVID-19  Vaccine (1) Never done  . INFLUENZA VACCINE  03/25/2020    There are no preventive care reminders to display for this patient.  Lab Results  Component Value Date   TSH 0.82 03/05/2020   Lab Results  Component Value Date   WBC 7.0 05/26/2020   HGB 11.2 (L) 08/09/2020   HCT 33.0 (L) 08/09/2020   MCV 79.1 (L) 05/26/2020   PLT 181 05/26/2020   Lab Results  Component Value Date   NA 137 08/09/2020   K 4.2 08/09/2020   CO2 28 06/11/2020   GLUCOSE 82 08/09/2020   BUN 16 08/09/2020   CREATININE 1.00 08/09/2020   BILITOT 0.7 05/26/2020   ALKPHOS 53 05/26/2020   AST 42 (H) 05/26/2020   ALT 27 05/26/2020   PROT 6.2 (L) 05/26/2020   ALBUMIN 2.6 (L) 05/26/2020   CALCIUM 8.8 06/11/2020   ANIONGAP 11 05/26/2020   GFR 65.68 06/11/2020   Lab Results  Component Value Date   CHOL 207 (H) 10/14/2018   Lab Results  Component Value Date   HDL 53.40 10/14/2018   Lab Results  Component Value Date   LDLCALC 135 (H) 10/14/2018   Lab Results  Component Value Date   TRIG 48 05/22/2020   Lab Results  Component Value Date   CHOLHDL 4 10/14/2018   Lab Results  Component Value Date   HGBA1C 5.2 03/05/2020      Assessment & Plan:   Problem List Items Addressed This Visit      High   Acute hypoxemic respiratory failure (HCC)    Improved on 2L, still mild tachycardic at 105, and I suspect aspirate PNA based on recent worsening dysphagia for unknown reason, wt loss and now abnormal BS on exam.  Pt now for EMS to take to [place].         No orders of the defined types were placed in this encounter.   Follow-up: Return if symptoms worsen or fail to improve.    [name], MD 09/19/2020 3:54 PM [facility]

## 2020-09-20 ENCOUNTER — Telehealth: Payer: Self-pay | Admitting: Radiation Oncology

## 2020-09-20 ENCOUNTER — Telehealth: Payer: Medicare HMO | Admitting: Internal Medicine

## 2020-09-20 DIAGNOSIS — J690 Pneumonitis due to inhalation of food and vomit: Secondary | ICD-10-CM | POA: Diagnosis not present

## 2020-09-20 LAB — CBC WITH DIFFERENTIAL/PLATELET
Abs Immature Granulocytes: 0.46 10*3/uL — ABNORMAL HIGH (ref 0.00–0.07)
Basophils Absolute: 0.1 10*3/uL (ref 0.0–0.1)
Basophils Relative: 0 %
Eosinophils Absolute: 0 10*3/uL (ref 0.0–0.5)
Eosinophils Relative: 0 %
HCT: 25.1 % — ABNORMAL LOW (ref 39.0–52.0)
Hemoglobin: 7.8 g/dL — ABNORMAL LOW (ref 13.0–17.0)
Immature Granulocytes: 1 %
Lymphocytes Relative: 3 %
Lymphs Abs: 0.9 10*3/uL (ref 0.7–4.0)
MCH: 22.3 pg — ABNORMAL LOW (ref 26.0–34.0)
MCHC: 31.1 g/dL (ref 30.0–36.0)
MCV: 71.7 fL — ABNORMAL LOW (ref 80.0–100.0)
Monocytes Absolute: 0.8 10*3/uL (ref 0.1–1.0)
Monocytes Relative: 2 %
Neutro Abs: 33.2 10*3/uL — ABNORMAL HIGH (ref 1.7–7.7)
Neutrophils Relative %: 94 %
Platelets: 393 10*3/uL (ref 150–400)
RBC: 3.5 MIL/uL — ABNORMAL LOW (ref 4.22–5.81)
RDW: 16.9 % — ABNORMAL HIGH (ref 11.5–15.5)
WBC: 35.4 10*3/uL — ABNORMAL HIGH (ref 4.0–10.5)
nRBC: 0 % (ref 0.0–0.2)

## 2020-09-20 LAB — URINALYSIS, ROUTINE W REFLEX MICROSCOPIC
Bilirubin Urine: NEGATIVE
Glucose, UA: NEGATIVE mg/dL
Ketones, ur: 5 mg/dL — AB
Nitrite: NEGATIVE
Protein, ur: 100 mg/dL — AB
RBC / HPF: >50 RBC/hpf — ABNORMAL HIGH (ref 0–5)
Specific Gravity, Urine: 1.009 (ref 1.005–1.030)
WBC, UA: >50 WBC/hpf — ABNORMAL HIGH (ref 0–5)
pH: 7 (ref 5.0–8.0)

## 2020-09-20 LAB — COMPREHENSIVE METABOLIC PANEL
ALT: 11 U/L (ref 0–44)
AST: 16 U/L (ref 15–41)
Albumin: 2.1 g/dL — ABNORMAL LOW (ref 3.5–5.0)
Alkaline Phosphatase: 96 U/L (ref 38–126)
Anion gap: 10 (ref 5–15)
BUN: 21 mg/dL (ref 8–23)
CO2: 25 mmol/L (ref 22–32)
Calcium: 7.7 mg/dL — ABNORMAL LOW (ref 8.9–10.3)
Chloride: 105 mmol/L (ref 98–111)
Creatinine, Ser: 1.15 mg/dL (ref 0.61–1.24)
GFR, Estimated: >60 mL/min (ref >60)
Glucose, Bld: 68 mg/dL — ABNORMAL LOW (ref 70–99)
Potassium: 3.4 mmol/L — ABNORMAL LOW (ref 3.5–5.1)
Sodium: 140 mmol/L (ref 135–145)
Total Bilirubin: 0.8 mg/dL (ref 0.3–1.2)
Total Protein: 5.7 g/dL — ABNORMAL LOW (ref 6.5–8.1)

## 2020-09-20 MED ORDER — DONEPEZIL HCL 10 MG PO TABS
10.0000 mg | ORAL_TABLET | Freq: Every day | ORAL | Status: DC
  Administered 2020-09-20: 10 mg via ORAL
  Filled 2020-09-20: qty 1
  Filled 2020-09-20: qty 2

## 2020-09-20 MED ORDER — SODIUM CHLORIDE 0.9 % IV SOLN: INTRAVENOUS | Status: DC

## 2020-09-20 MED ORDER — IPRATROPIUM-ALBUTEROL 0.5-2.5 (3) MG/3ML IN SOLN: 3.0000 mL | Freq: Four times a day (QID) | RESPIRATORY_TRACT | Status: DC | PRN

## 2020-09-20 MED ORDER — ASPIRIN EC 81 MG PO TBEC
81.0000 mg | DELAYED_RELEASE_TABLET | Freq: Every day | ORAL | Status: DC
  Administered 2020-09-20 – 2020-09-30 (×11): 81 mg via ORAL
  Filled 2020-09-20 (×11): qty 1

## 2020-09-20 MED ORDER — QUETIAPINE FUMARATE 25 MG PO TABS
25.0000 mg | ORAL_TABLET | Freq: Two times a day (BID) | ORAL | Status: DC
  Administered 2020-09-20 (×2): 25 mg via ORAL
  Filled 2020-09-20 (×3): qty 1

## 2020-09-20 MED ORDER — FLUTTER DEVI: 1.0000 | Status: DC

## 2020-09-20 MED ORDER — HEPARIN SODIUM (PORCINE) 5000 UNIT/ML IJ SOLN
5000.0000 [IU] | Freq: Three times a day (TID) | INTRAMUSCULAR | Status: DC
  Administered 2020-09-20 – 2020-09-27 (×22): 5000 [IU] via SUBCUTANEOUS
  Filled 2020-09-20 (×23): qty 1

## 2020-09-20 MED ORDER — HALOPERIDOL LACTATE 5 MG/ML IJ SOLN
2.0000 mg | Freq: Once | INTRAMUSCULAR | Status: AC
  Administered 2020-09-20: 2 mg via INTRAVENOUS
  Filled 2020-09-20: qty 1

## 2020-09-20 MED ORDER — AMLODIPINE BESYLATE 5 MG PO TABS
5.0000 mg | ORAL_TABLET | Freq: Every day | ORAL | Status: DC
  Administered 2020-09-20 – 2020-09-30 (×11): 5 mg via ORAL
  Filled 2020-09-20 (×11): qty 1

## 2020-09-20 NOTE — ED Notes (Signed)
Isak Sotomayor [phone] would like an update.

## 2020-09-20 NOTE — ED Notes (Signed)
Pressure sore noted to pt's sacral area Jenna/RN notified to assess area Sacral cushion placed for protection and comfort

## 2020-09-20 NOTE — ED Notes (Signed)
ED TO INPATIENT HANDOFF REPORT  Name/Age/Gender Matthew Gray 87 y.o. male  Code Status Code Status History    Date Active Date Inactive Code Status Order ID Comments User Context   05/22/2020 0012 05/27/2020 1746 Full Code [id number]  Etta Quill, DO ED   11/02/2018 1739 11/05/2018 1649 Full Code [id number]  Barb Merino, MD Inpatient   Advance Care Planning Activity    Questions for Most Recent Historical Code Status (Order [id number])         Advance Directive Documentation   Flowsheet Row Most Recent Value  Type of Advance Directive Healthcare Power of Attorney, Living will  Pre-existing out of facility DNR order (yellow form or pink MOST form) -  "MOST" Form in Place? -      Home/SNF/Other Home  Chief Complaint Aspiration pneumonia ([place]) [J69.0]  Level of Care/Admitting Diagnosis ED Disposition    ED Disposition Condition [place]: [place] [[id number]]  Level of Care: Telemetry [5]  Admit to tele based on following criteria: Complex arrhythmia (Bradycardia/Tachycardia)  May admit patient to [facility] or [facility] if equivalent level of care is available:: Yes  Covid Evaluation: Confirmed COVID Negative  Diagnosis: Aspiration pneumonia [facility]) [[id number]]  Admitting Physician: Elwyn Reach [[id number]]  Attending Physician: Elwyn Reach [[id number]]  Estimated length of stay: past midnight tomorrow  Certification:: I certify this patient will need inpatient services for at least 2 midnights       Medical History Past Medical History:  Diagnosis Date  . Advanced dementia ([place]) 2014   followed by pcp---  previously had seen neurologist, dr Krista Blue  . Allergic rhinitis   . Bladder cancer [facility])    urologist--- dr Louis Meckel  . BPH (benign prostatic hyperplasia)   . Chronic cough    per pt daughter, pt has always had cough long before had covid   . CKD (chronic kidney disease), stage III ([place])   . Congestion of respiratory tract     post covid ,  per pt daughter mostly in morning time after doing nebulizer and productive  . 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 Coronary artery disease    cardiac cath 08-11-2001 in epic,  very minimal nonobstructive cad and luminal irregularities, normal lvsf  . DDD (degenerative disc disease), lumbar   . Depression   . 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 Hematuria   . History of 2019 novel coronavirus disease (COVID-19) 05-21-2020 dx   hospitalized and discharged 05-27-2020, covid pneumonia, only on oxygen   . History of adenomatous polyp of colon   . History of recurrent pneumonia   . History of sepsis 10/2018   due to pneumonia  . History of vertebral compression fracture    L3  . Hyperlipidemia   . Hypertension    followed by pcp  . Left leg swelling    08-08-2020 per pt daughter , not red or does not feel warm  . Peripheral neuropathy   . Peripheral vascular disease ([place])   . Urinary incontinence, mixed   . Wears dentures    upper    Allergies Allergies  Allergen Reactions  . Atorvastatin Other (See Comments)    REACTION: myalgias  . Simvastatin Other (See Comments)    REACTION: myalgias  . Zetia [Ezetimibe] Other (See Comments)    weakness  .  Ace Inhibitors Other (See Comments)    cough  . Penicillins Other (See Comments)    Unknown; Tolerated Ceftriaxone on 10/2018    IV Location/Drains/Wounds Patient Lines/Drains/Airways Status    Active Line/Drains/Airways    Name Placement date Placement time Site Days   Peripheral IV 09/20/20 Anterior;Distal;Left;Upper Arm 09/20/20  1912  Arm  less than 1   External Urinary Catheter 09/20/20  0800  -  less than 1   Incision (Closed) 08/09/20 Penis 08/09/20  1216  - 42   Pressure Injury 05/22/20 Coccyx Stage 2 -  Partial thickness loss of dermis presenting as a shallow open injury  with a red, pink wound bed without slough. 05/22/20  1622  - 121          Labs/Imaging Results for orders placed or performed during the hospital encounter of 09/19/20 (from the past 48 hour(s))  Comprehensive metabolic panel     Status: Abnormal   Collection Time: 09/19/20  5:24 PM  Result Value Ref Range   Sodium 136 135 - 145 mmol/L   Potassium 4.1 3.5 - 5.1 mmol/L   Chloride 98 98 - 111 mmol/L   CO2 25 22 - 32 mmol/L   Glucose, Bld 81 70 - 99 mg/dL    Comment: Glucose reference range applies only to samples taken after fasting for at least 8 hours.   BUN 29 (H) 8 - 23 mg/dL   Creatinine, Ser 1.21 0.61 - 1.24 mg/dL   Calcium 8.5 (L) 8.9 - 10.3 mg/dL   Total Protein 6.5 6.5 - 8.1 g/dL   Albumin 2.6 (L) 3.5 - 5.0 g/dL   AST 17 15 - 41 U/L   ALT 14 0 - 44 U/L   Alkaline Phosphatase 111 38 - 126 U/L   Total Bilirubin 0.7 0.3 - 1.2 mg/dL   GFR, Estimated 59 (L) >60 mL/min    Comment: (NOTE) Calculated using the CKD-EPI Creatinine Equation (2021)    Anion gap 13 5 - 15    Comment: Performed at [facility], [place] [address] [place], [place] [address]  CBC with Differential     Status: Abnormal   Collection Time: 09/19/20  5:24 PM  Result Value Ref Range   WBC 36.0 (H) 4.0 - 10.5 K/uL   RBC 3.78 (L) 4.22 - 5.81 MIL/uL   Hemoglobin 8.5 (L) 13.0 - 17.0 g/dL    Comment: Reticulocyte Hemoglobin testing may be clinically indicated, consider ordering this additional test [id number]    HCT 28.0 (L) 39.0 - 52.0 %   MCV 74.1 (L) 80.0 - 100.0 fL   MCH 22.5 (L) 26.0 - 34.0 pg   MCHC 30.4 30.0 - 36.0 g/dL   RDW 16.8 (H) 11.5 - 15.5 %   Platelets 416 (H) 150 - 400 K/uL   nRBC 0.0 0.0 - 0.2 %   Neutrophils Relative % 93 %   Neutro Abs 33.4 (H) 1.7 - 7.7 K/uL   Lymphocytes Relative 3 %   Lymphs Abs 1.2 0.7 - 4.0 K/uL   Monocytes Relative 3 %   Monocytes Absolute 0.9 0.1 - 1.0 K/uL   Eosinophils Relative 0 %   Eosinophils Absolute 0.0 0.0 - 0.5 K/uL   Basophils  Relative 0 %   Basophils Absolute 0.1 0.0 - 0.1 K/uL   WBC Morphology TOXIC GRANULATION     Comment: VACUOLATED NEUTROPHILS   Immature Granulocytes 1 %   Abs Immature Granulocytes 0.39 (H) 0.00 - 0.07 K/uL    Comment:  Performed at [facility], [place] [address] [place], [place] [address]  Culture, blood (routine x 2)     Status: None (Preliminary result)   Collection Time: 09/19/20  5:24 PM   Specimen: BLOOD  Result Value Ref Range   Specimen Description      BLOOD RIGHT ANTECUBITAL Performed at [facility], [place] [address]  [place], [place] [address]    Special Requests      BOTTLES DRAWN AEROBIC AND ANAEROBIC Blood Culture results may not be optimal due to an inadequate volume of blood received in culture bottles Performed at [facility], [place] [address] [place], [place] [address]    Culture      NO GROWTH < 24 HOURS Performed at [place] [address] [place], [place] [address]    Report Status PENDING   Brain natriuretic peptide     Status: Abnormal   Collection Time: 09/19/20  5:24 PM  Result Value Ref Range   B Natriuretic Peptide 353.6 (H) 0.0 - 100.0 pg/mL    Comment: Performed at [facility], [place] [address], [place], [place] [address]  Troponin I (High Sensitivity)     Status: None   Collection Time: 09/19/20  5:24 PM  Result Value Ref Range   Troponin I (High Sensitivity) 11 <18 ng/L    Comment: (NOTE) Elevated high sensitivity troponin I (hsTnI) values and significant  changes across serial measurements may suggest ACS but many other  chronic and acute conditions are known to elevate hsTnI results.  Refer to the "Links" section for chest pain algorithms and additional  guidance. Performed at [facility], [place] [address] [place],  [address]   SARS Coronavirus 2 by RT PCR (hospital order, performed in [facility] hospital lab) Nasopharyngeal  Nasopharyngeal Swab     Status: None   Collection Time: 09/19/20  5:30 PM   Specimen: Nasopharyngeal Swab  Result Value Ref Range   SARS Coronavirus 2 NEGATIVE NEGATIVE    Comment: (NOTE) SARS-CoV-2 target nucleic acids are NOT DETECTED.  The SARS-CoV-2 RNA is generally detectable in upper and lower respiratory specimens during the acute phase of infection. The lowest concentration of SARS-CoV-2 viral copies this assay can detect is 250 copies / mL. A negative result does not preclude SARS-CoV-2 infection and should not be used as the sole basis for treatment or other patient management decisions.  A negative result may occur with improper specimen collection / handling, submission of specimen other than nasopharyngeal swab, presence of viral mutation(s) within the areas targeted by this assay, and inadequate number of viral copies (<250 copies / mL). A negative result must be combined with clinical observations, patient history, and epidemiological information.  Fact Sheet for Patients:   StrictlyIdeas.no  Fact Sheet for Healthcare Providers: BankingDealers.co.za  This test is not yet approved or  cleared by the [place] FDA and has been authorized for detection and/or diagnosis of SARS-CoV-2 by FDA under an Emergency Use Authorization (EUA).  This EUA will remain in effect (meaning this test can be used) for the duration of the COVID-19 declaration under Section 564(b)(1) of the Act, 21 U.S.C. section 360bbb-3(b)(1), unless the authorization is terminated or revoked sooner.  Performed at [facility], [place] [address] [place], [place] [address]   Lactic acid, plasma     Status: None   Collection Time: 09/19/20  5:39 PM  Result Value Ref Range   Lactic Acid,  Venous 1.8 0.5 - 1.9 mmol/L    Comment: Performed at [facility], [place] [address] [place], [place] [address]  Culture, blood  (routine x 2)     Status: None (Preliminary result)   Collection Time: 09/19/20  6:00 PM   Specimen: BLOOD RIGHT FOREARM  Result Value Ref Range   Specimen Description      BLOOD RIGHT FOREARM Performed at [place] [address], [place], [place] [address]    Special Requests      BOTTLES DRAWN AEROBIC ONLY Blood Culture adequate volume Performed at [place] [address] [place], [place] [address]    Culture      NO GROWTH < 24 HOURS Performed at [place] [address], [place], [place] [address]    Report Status PENDING   POC occult blood, ED     Status: None   Collection Time: 09/19/20  7:52 PM  Result Value Ref Range   Fecal Occult Bld NEGATIVE NEGATIVE  Lactic acid, plasma     Status: None   Collection Time: 09/19/20 10:33 PM  Result Value Ref Range   Lactic Acid, Venous 0.9 0.5 - 1.9 mmol/L    Comment: Performed at [facility], [place] [address], [place], [place] [address]  Troponin I (High Sensitivity)     Status: None   Collection Time: 09/19/20 10:33 PM  Result Value Ref Range   Troponin I (High Sensitivity) 4 <18 ng/L    Comment: (NOTE) Elevated high sensitivity troponin I (hsTnI) values and significant  changes across serial measurements may suggest ACS but many other  chronic and acute conditions are known to elevate hsTnI results.  Refer to the "Links" section for chest pain algorithms and additional  guidance. Performed at [facility], [place] [address] [place], [place] [address]   Comprehensive metabolic panel     Status: Abnormal   Collection Time: 09/20/20  8:22 AM  Result Value Ref Range   Sodium 140 135 - 145 mmol/L   Potassium 3.4 (L) 3.5 - 5.1 mmol/L    Comment: DELTA CHECK NOTED   Chloride 105 98 - 111 mmol/L   CO2 25 22 - 32 mmol/L   Glucose, Bld 68 (L) 70 - 99 mg/dL    Comment: Glucose reference range applies only to samples taken after fasting for  at least 8 hours.   BUN 21 8 - 23 mg/dL   Creatinine, Ser 1.15 0.61 - 1.24 mg/dL   Calcium 7.7 (L) 8.9 - 10.3 mg/dL   Total Protein 5.7 (L) 6.5 - 8.1 g/dL   Albumin 2.1 (L) 3.5 - 5.0 g/dL   AST 16 15 - 41 U/L   ALT 11 0 - 44 U/L   Alkaline Phosphatase 96 38 - 126 U/L   Total Bilirubin 0.8 0.3 - 1.2 mg/dL   GFR, Estimated >60 >60 mL/min    Comment: (NOTE) Calculated using the CKD-EPI Creatinine Equation (2021)    Anion gap 10 5 - 15    Comment: Performed at [facility], [place] [address], [place], [place] [address]  CBC with Differential/Platelet     Status: Abnormal   Collection Time: 09/20/20  8:22 AM  Result Value Ref Range   WBC 35.4 (H) 4.0 - 10.5 K/uL   RBC 3.50 (L) 4.22 - 5.81 MIL/uL   Hemoglobin 7.8 (L) 13.0 - 17.0 g/dL    Comment: Reticulocyte Hemoglobin testing may be clinically  indicated, consider ordering this additional test [id number]    HCT 25.1 (L) 39.0 - 52.0 %   MCV 71.7 (L) 80.0 - 100.0 fL   MCH 22.3 (L) 26.0 - 34.0 pg   MCHC 31.1 30.0 - 36.0 g/dL   RDW 16.9 (H) 11.5 - 15.5 %   Platelets 393 150 - 400 K/uL   nRBC 0.0 0.0 - 0.2 %   Neutrophils Relative % 94 %   Neutro Abs 33.2 (H) 1.7 - 7.7 K/uL   Lymphocytes Relative 3 %   Lymphs Abs 0.9 0.7 - 4.0 K/uL   Monocytes Relative 2 %   Monocytes Absolute 0.8 0.1 - 1.0 K/uL   Eosinophils Relative 0 %   Eosinophils Absolute 0.0 0.0 - 0.5 K/uL   Basophils Relative 0 %   Basophils Absolute 0.1 0.0 - 0.1 K/uL   WBC Morphology TOXIC GRANULATION     Comment: VACUOLATED NEUTROPHILS   Immature Granulocytes 1 %   Abs Immature Granulocytes 0.46 (H) 0.00 - 0.07 K/uL    Comment: Performed at [facility], [place] [address], [place], [place] [address]  Urinalysis, Routine w reflex microscopic     Status: Abnormal   Collection Time: 09/20/20 12:32 PM  Result Value Ref Range   Color, Urine YELLOW YELLOW   APPearance CLOUDY (A) CLEAR   Specific Gravity, Urine 1.009 1.005 - 1.030   pH 7.0  5.0 - 8.0   Glucose, UA NEGATIVE NEGATIVE mg/dL   Hgb urine dipstick LARGE (A) NEGATIVE   Bilirubin Urine NEGATIVE NEGATIVE   Ketones, ur 5 (A) NEGATIVE mg/dL   Protein, ur 100 (A) NEGATIVE mg/dL   Nitrite NEGATIVE NEGATIVE   Leukocytes,Ua LARGE (A) NEGATIVE   RBC / HPF >50 (H) 0 - 5 RBC/hpf   WBC, UA >50 (H) 0 - 5 WBC/hpf   Bacteria, UA RARE (A) NONE SEEN   Squamous Epithelial / LPF 0-5 0 - 5   WBC Clumps PRESENT     Comment: Performed at [facility], [place] [address], [place], [place] [address]   DG Chest Port 1 View  Result Date: 09/19/2020 CLINICAL DATA:  [name] 2 days ago, PCP, COPD EXAM: PORTABLE CHEST 1 VIEW COMPARISON:  05/21/2020 FINDINGS: Single frontal view of the chest demonstrates an unremarkable cardiac silhouette. There is background emphysema, with bibasilar airspace disease greatest on the right. Small right pleural effusion is suspected. No pneumothorax. No acute bony abnormalities. IMPRESSION: 1. Bibasilar consolidation, right greater than left, favor airspace disease over atelectasis. 2. Small right pleural effusion. 3. Emphysema. Electronically Signed   By: Randa Ngo M.D.   On: 09/19/2020 17:24    Pending Labs Unresulted Labs (From admission, onward)          Start     Ordered   09/21/20 0500  CBC with Differential/Platelet  Daily,   R      09/20/20 0759   09/21/20 0500  Comprehensive metabolic panel  Daily,   R      09/20/20 0759          Vitals/Pain Today's Vitals   09/20/20 1600 09/20/20 1623 09/20/20 1700 09/20/20 1910  BP: 118/78  120/81 132/90  Pulse: (!) 103 100 98 97  Resp: (!) 40 (!) 24 (!) 22 14  Temp:      TempSrc:      SpO2: 100% 100% 100% 99%  Weight:      Height:      PainSc:  Isolation Precautions No active isolations  Medications Medications  ipratropium-albuterol (DUONEB) 0.5-2.5 (3) MG/3ML nebulizer solution 3 mL (has no administration in time range)  aspirin EC tablet 81 mg (81 mg Oral Given  09/20/20 1340)  amLODipine (NORVASC) tablet 5 mg (5 mg Oral Given 09/20/20 1341)  QUEtiapine (SEROQUEL) tablet 25 mg (25 mg Oral Given 09/20/20 1342)  donepezil (ARICEPT) tablet 10 mg (10 mg Oral Given 09/20/20 1343)  0.9 %  sodium chloride infusion ( Intravenous Infusion Verify 09/20/20 1914)  heparin injection 5,000 Units (5,000 Units Subcutaneous Given 09/20/20 1344)  cefTRIAXone (ROCEPHIN) 1 g in sodium chloride 0.9 % 100 mL IVPB (0 g Intravenous Stopped 09/19/20 1934)  azithromycin (ZITHROMAX) 500 mg in sodium chloride 0.9 % 250 mL IVPB (0 mg Intravenous Stopped 09/19/20 2200)  haloperidol lactate (HALDOL) injection 2 mg (2 mg Intravenous Given 09/20/20 1912)    Mobility non-ambulatory

## 2020-09-20 NOTE — ED Notes (Signed)
Food trey brought to pt room, pt ate a few bites, RN in room to monitor d/t aspiration pneumonia

## 2020-09-20 NOTE — Progress Notes (Signed)
Clinical/Bedside Swallow Evaluation Patient Details  Name: Matthew Gray MRN: [id number] Date of Birth: 23-May-1934  Today's Date: 09/20/2020 Time: SLP Start Time (ACUTE ONLY): 2778 SLP Stop Time (ACUTE ONLY): 1845 SLP Time Calculation (min) (ACUTE ONLY): 35 min  Past Medical History:  Past Medical History:  Diagnosis Date   Advanced dementia ([place]) 2014   followed by pcp---  previously had seen neurologist, dr Krista Blue   Allergic rhinitis    Bladder cancer [facility])    urologist--- dr Louis Meckel   BPH (benign prostatic hyperplasia)    Chronic cough    per pt daughter, pt has always had cough long before had covid    CKD (chronic kidney disease), stage III ([place])    Congestion of respiratory tract    post covid ,  per pt daughter mostly in morning time after doing nebulizer and productive   COPD with asthma ([place])    pulmologist-- dr Ina Homes--  mild intermittant asthma--- hx byssinosis and recurrent pneumonia   Coronary artery disease    cardiac cath 08-11-2001 in epic,  very minimal nonobstructive cad and luminal irregularities, normal lvsf   DDD (degenerative disc disease), lumbar    Depression    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   residual, mild, followed by pcp   Hematuria    History of 2019 novel coronavirus disease (COVID-19) 05-21-2020 dx   hospitalized and discharged 05-27-2020, covid pneumonia, only on oxygen    History of adenomatous polyp of colon    History of recurrent pneumonia    History of sepsis 10/2018   due to pneumonia   History of vertebral compression fracture    L3   Hyperlipidemia    Hypertension    followed by pcp   Left leg swelling    08-08-2020 per pt daughter , not red or does not feel warm   Peripheral neuropathy    Peripheral vascular disease ([place])    Urinary incontinence, mixed    Wears dentures    upper   Past Surgical History:  Past Surgical History:  Procedure Laterality Date   CARDIAC CATHETERIZATION  08-11-2001  @[facility]    very  minmial nonobstructive cad, normal lvsf   CYSTOSCOPY W/ RETROGRADES N/A 08/09/2020   Procedure: CYSTOSCOPY WITH BILATERAL RETROGRADE PYELOGRAM;  Surgeon: Ardis Hughs, MD;  Location: [facility];  Service: Urology;  Laterality: N/A;   NO PAST SURGERIES     TRANSURETHRAL RESECTION OF BLADDER TUMOR N/A 08/09/2020   Procedure: TRANSURETHRAL RESECTION OF BLADDER TUMOR (TURBT);  Surgeon: Ardis Hughs, MD;  Location: [facility];  Service: Urology;  Laterality: N/A;   HPI:  pt is an 87 yo male admitted to [facility] after fall - diagnosed with FTT and pna *right more than left.  Pt has h/o dementia, recurrent pnas, chronic bronchitis.  Pt has undergone MBS in thte past with findings of mild dysphagia - coughing throughout testing without infiltration of barium into larynx/trachea.  Pt repeated prior CXRs 03/05/2020, 03/2020, 04/2020 and 08/2020 showing concern for recurrent pnas.  Pt today admits to dysphagia prior to admission - coughing with liquids more than with foods.  Pt is a full code at this time.   Assessment / Plan / Recommendation Clinical Impression  Patient presents with clinical indications concerning for oropharyngeal dysphagia.  He demonstrates excessive lingual movement, presumed delayed pharyngeal swallow and coughing post-swallow across all consistencies.  Also noted, pt oxygen saturation decreaed with intake to mid 80s and high 70s as well as  RR up to high 20s. Pt unfortunately demonstrates decreaed ability to follow directions and is unable to clear occasional wet voice despite cues to cough strongly.  Pt talks t/o session, requiring verbal and visual cues to cease talking and breath- which allowed oxygen saturation to increase back to 90s.     At this time, recommend pt be npo except po medications with applesauce *crushed* and tsp honey thick liquids.  Given pt is a full code, MBS indicated to allow instrumental evaluation of swallowing/airway  protection.  Will plan MBS next date in am.  Regardless of MBS findings, concern for recurrent aspiration pna present given pt's decreased clearance of secretions, recurrent concerning findings on CXR and report of coughing with intake prior to admit. SLP Visit Diagnosis: Dysphagia, unspecified (R13.10)    Aspiration Risk  Moderate aspiration risk;Severe aspiration risk    Diet Recommendation NPO;NPO except meds;Honey-thick liquid (tsps honey thick and medicinie with applesauce (crush))   Liquid Administration via: Spoon Medication Administration: Crushed with puree Supervision: Full supervision/cueing for compensatory strategies Compensations: Slow rate;Small sips/bites Postural Changes: Seated upright at 90 degrees;Remain upright for at least 30 minutes after po intake    Other  Recommendations Oral Care Recommendations: Oral care QID   Follow up Recommendations Other (comment) (TBD)      Frequency and Duration     TBD       Prognosis Prognosis for Safe Diet Advancement: Guarded      Swallow Study   General Date of Onset:  (?) HPI: pt is an 87 yo male admitted to [facility] after fall - diagnosed with FTT and pna *right more than left.  Pt has h/o dementia, recurrent pnas, chronic bronchitis.  Pt has undergone MBS in thte past with findings of mild dysphagia - coughing throughout testing without infiltration of barium into larynx/trachea.  Pt repeated prior CXRs 03/05/2020, 03/2020, 04/2020 and 08/2020 showing concern for recurrent pnas.  Pt today admits to dysphagia prior to admission - coughing with liquids more than with foods.  Pt is a full code at this time. Type of Study: Bedside Swallow Evaluation Previous Swallow Assessment: see HPI Diet Prior to this Study: Dysphagia 1 (puree);Thin liquids Temperature Spikes Noted: No Respiratory Status: Room air History of Recent Intubation: No Behavior/Cognition: Alert;Cooperative;Confused Oral Cavity Assessment: Within Functional Limits Oral  Care Completed by SLP: No Oral Cavity - Dentition: Dentures, top (no lower dentures, single lower tooth) Self-Feeding Abilities: Total assist (pt has mitts in place) Patient Positioning: Upright in bed Baseline Vocal Quality: Low vocal intensity Volitional Cough: Weak;Congested Volitional Swallow: Unable to elicit    Oral/Motor/Sensory Function Overall Oral Motor/Sensory Function: Within functional limits (decreased ability to follow directions, but no obvious focal CN deficits)   Ice Chips Ice chips: Not tested   Thin Liquid Thin Liquid: Not tested    Nectar Thick Nectar Thick Liquid: Impaired Presentation: Spoon Oral Phase Impairments: Other (comment) Oral phase functional implications:  (excessive lingual movement, repetitive) Pharyngeal Phase Impairments: Suspected delayed Swallow;Cough - Immediate;Cough - Delayed   Honey Thick Honey Thick Liquid: Impaired Presentation: Spoon;Cup Oral Phase Impairments: Other (comment) Oral Phase Functional Implications: Other (comment) (excessive lingual movements) Pharyngeal Phase Impairments: Suspected delayed Swallow;Cough - Immediate;Cough - Delayed   Puree Puree: Impaired Presentation: Spoon Oral Phase Impairments: Other (comment) Oral Phase Functional Implications:  (excessive lingual movement, repetitive) Pharyngeal Phase Impairments: Cough - Delayed   Solid     Solid: Not tested      Macario Golds 09/20/2020,7:08 PM  Kathleen Lime, MS [facility] SLP Acute Rehab Services Office [phone] Pager [phone]

## 2020-09-20 NOTE — Telephone Encounter (Signed)
Opened in error

## 2020-09-20 NOTE — ED Notes (Signed)
Pt is comfortably resting/sleeping. Entered room at start of shift (0715) to introduce myself and check on pt, daughter was concerned pt did not have any of his regular meds last night, I told her I would check with the MD. MD stated to attempt meds and to let him know if there were any issues.  Meds were ordered and verified, this RN entered the room to administer meds and daughter refused them d/t concern that pt would "get sick to his stomach if he takes those without food". This RN attempted to explain that we were waiting for speech evaluation d/t dx of aspirational pneumonia and food can cause complications sometimes. Daughter continued to refuse meds until I check with MD about food and then took meds from my hand to "inspect them" and eventually returned meds to this RN. MD made aware.

## 2020-09-20 NOTE — ED Notes (Signed)
Let daughter know that MD ordered dysphagia diet for pt and we will medicate pt after he eats, daughter agrees with that plan

## 2020-09-20 NOTE — Progress Notes (Signed)
PROGRESS NOTE   Matthew Gray  YWV:[id number] DOB: 01-08-1934 DOA: 09/19/2020 PCP: Matthew Borg, MD  Brief Narrative:  87 year old black male known history of COPD CAD HLD CKD 2 bladder tumor status post TURBT 08/09/2020-supposed to undergo XRT under Matthew Gray soon hyperglycemia without diagnosis of diabetes mellitus home dwelling Goes to daycare Found to have Covid pneumonia treated for the same 05/2020 Seen at PCP office 1/26 found to be weak hypoxic-daughter relates not been eating adequately apparently has been having difficulty eating and airway congestion  Sats and 70% on room air White count 36, hemoglobin 8 lactic acid 1.8 CXR = consolidation  Assessment & Plan:   Principal Problem:   Aspiration pneumonia ([place]) Active Problems:   Hyperlipidemia   Essential hypertension   COPD (chronic obstructive pulmonary disease) ([place])   Acute renal failure superimposed on chronic kidney disease ([place])   Acute hypoxemic respiratory failure ([place])   1. Sepsis 2/2 likely aspiration pneumonia a. Await SLP eval but can keep on dysphagia 1 diet for now until evaluated b. Continue empiric azithromycin ceftriaxone c. Cycle labs 2. COPD a. Resume DuoNeb every 6 as needed b. Attempt flutter valve use 3. CAD a. Not on ACE/not on beta-blocker? b. Outpatient reeval can  c. continue amlodipine 5 4. AKI superimposed on CKD 2  a. Hydrate with fluids 50 cc/h NS 5. Bladder tumor status post TURBT a. Outpatient reevaluation management 6. Hyperglycemia 7. Adult failure to thrive in the setting of dementia a. Resume Aricept 10, for behavioral disturbances can use quetiapine 25 twice daily   DVT prophylaxis: Lovenox Code Status: Full code Family Communication: Long discussion with daughter Matthew Gray at the bedside-patient has been weak for several days had a fall but I do not see any overall weakness She insists that she will be taking care of him at home as she has a set up there We will get  nonemergent therapy eval Disposition:  Status is: Inpatient  Remains inpatient appropriate because:Hemodynamically unstable   Dispo: The patient is from: Home              Anticipated d/c is to: SNF              Anticipated d/c date is: 2 days              Patient currently is not medically stable to d/c.   Difficult to place patient No       Consultants:   None  Procedures: None  Antimicrobials: As above   Subjective: Awake but not coherent does not make sense cannot tell me where he is Looks comfortable  Objective: Vitals:   09/19/20 2245 09/20/20 0212 09/20/20 0245 09/20/20 0615  BP: 104/72 112/86 107/68 131/80  Pulse: 90 96 94 87  Resp: 20 (!) 22 19 15   Temp:      TempSrc:      SpO2: 100% 100% 99% 99%  Weight:      Height:       No intake or output data in the 24 hours ending 09/20/20 0750 Filed Weights   09/19/20 1717  Weight: 49 kg    Examination:  EOMI NCAT no focal deficit very frail Chest clear no added sound Abdomen soft nontender no rebound no guarding ROM intact moving 4 limbs equally without deficit and no restriction Power 5/5  Data Reviewed: I have personally reviewed following labs and imaging studies  COVID-19 Labs  No results for input(s): DDIMER, FERRITIN, LDH, CRP in the last 72  hours.  Lab Results  Component Value Date   SARSCOV2NAA NEGATIVE 09/19/2020   SARSCOV2NAA Detected (A) 06/11/2020   SARSCOV2NAA POSITIVE (A) 05/21/2020     Radiology Studies: [facility] Chest Port 1 View  Result Date: 09/19/2020 CLINICAL DATA:  [name] 2 days ago, PCP, COPD EXAM: PORTABLE CHEST 1 VIEW COMPARISON:  05/21/2020 FINDINGS: Single frontal view of the chest demonstrates an unremarkable cardiac silhouette. There is background emphysema, with bibasilar airspace disease greatest on the right. Small right pleural effusion is suspected. No pneumothorax. No acute bony abnormalities. IMPRESSION: 1. Bibasilar consolidation, right greater than left, favor  airspace disease over atelectasis. 2. Small right pleural effusion. 3. Emphysema. Electronically Signed   By: Matthew Gray M.D.   On: 09/19/2020 17:24     Scheduled Meds: Continuous Infusions:   LOS: 1 day    Time spent: Tucker, MD [facility] To contact the attending provider between 7A-7P or the covering provider during after hours 7P-7A, please log into the web site [email or web address] and access using universal [place] password for that web site. If you do not have the password, please call the hospital operator.  09/20/2020, 7:50 AM

## 2020-09-21 ENCOUNTER — Inpatient Hospital Stay (HOSPITAL_COMMUNITY): Payer: Medicare HMO

## 2020-09-21 DIAGNOSIS — J690 Pneumonitis due to inhalation of food and vomit: Secondary | ICD-10-CM | POA: Diagnosis not present

## 2020-09-21 LAB — CBC WITH DIFFERENTIAL/PLATELET
Abs Immature Granulocytes: 0.27 10*3/uL — ABNORMAL HIGH (ref 0.00–0.07)
Basophils Absolute: 0.1 10*3/uL (ref 0.0–0.1)
Basophils Relative: 0 %
Eosinophils Absolute: 0 10*3/uL (ref 0.0–0.5)
Eosinophils Relative: 0 %
HCT: 25.7 % — ABNORMAL LOW (ref 39.0–52.0)
Hemoglobin: 7.6 g/dL — ABNORMAL LOW (ref 13.0–17.0)
Immature Granulocytes: 1 %
Lymphocytes Relative: 2 %
Lymphs Abs: 0.7 10*3/uL (ref 0.7–4.0)
MCH: 22.1 pg — ABNORMAL LOW (ref 26.0–34.0)
MCHC: 29.6 g/dL — ABNORMAL LOW (ref 30.0–36.0)
MCV: 74.7 fL — ABNORMAL LOW (ref 80.0–100.0)
Monocytes Absolute: 0.7 10*3/uL (ref 0.1–1.0)
Monocytes Relative: 2 %
Neutro Abs: 31.4 10*3/uL — ABNORMAL HIGH (ref 1.7–7.7)
Neutrophils Relative %: 95 %
Platelets: 403 10*3/uL — ABNORMAL HIGH (ref 150–400)
RBC: 3.44 MIL/uL — ABNORMAL LOW (ref 4.22–5.81)
RDW: 17 % — ABNORMAL HIGH (ref 11.5–15.5)
WBC: 33.2 10*3/uL — ABNORMAL HIGH (ref 4.0–10.5)
nRBC: 0 % (ref 0.0–0.2)

## 2020-09-21 LAB — COMPREHENSIVE METABOLIC PANEL
ALT: 12 U/L (ref 0–44)
AST: 17 U/L (ref 15–41)
Albumin: 2.3 g/dL — ABNORMAL LOW (ref 3.5–5.0)
Alkaline Phosphatase: 93 U/L (ref 38–126)
Anion gap: 12 (ref 5–15)
BUN: 17 mg/dL (ref 8–23)
CO2: 22 mmol/L (ref 22–32)
Calcium: 8.4 mg/dL — ABNORMAL LOW (ref 8.9–10.3)
Chloride: 107 mmol/L (ref 98–111)
Creatinine, Ser: 0.67 mg/dL (ref 0.61–1.24)
GFR, Estimated: >60 mL/min (ref >60)
Glucose, Bld: 75 mg/dL (ref 70–99)
Potassium: 3.3 mmol/L — ABNORMAL LOW (ref 3.5–5.1)
Sodium: 141 mmol/L (ref 135–145)
Total Bilirubin: 0.8 mg/dL (ref 0.3–1.2)
Total Protein: 5.8 g/dL — ABNORMAL LOW (ref 6.5–8.1)

## 2020-09-21 MED ORDER — POTASSIUM CHLORIDE 20 MEQ PO PACK
40.0000 meq | PACK | Freq: Every day | ORAL | Status: DC
  Administered 2020-09-21 – 2020-09-24 (×4): 40 meq via ORAL
  Filled 2020-09-21 (×4): qty 2

## 2020-09-21 MED ORDER — SODIUM CHLORIDE 0.9 % IV BOLUS
500.0000 mL | Freq: Once | INTRAVENOUS | Status: AC
  Administered 2020-09-21: 500 mL via INTRAVENOUS

## 2020-09-21 MED ORDER — ACETAMINOPHEN 325 MG PO TABS
650.0000 mg | ORAL_TABLET | Freq: Four times a day (QID) | ORAL | Status: DC | PRN
  Administered 2020-09-21 – 2020-09-29 (×3): 650 mg via ORAL
  Filled 2020-09-21 (×4): qty 2

## 2020-09-21 MED ORDER — DONEPEZIL HCL 10 MG PO TABS
10.0000 mg | ORAL_TABLET | Freq: Every day | ORAL | Status: DC
Start: 2020-09-21 — End: 2020-09-30
  Administered 2020-09-21 – 2020-09-29 (×9): 10 mg via ORAL
  Filled 2020-09-21 (×9): qty 1

## 2020-09-21 MED ORDER — ENSURE ENLIVE PO LIQD
237.0000 mL | Freq: Three times a day (TID) | ORAL | Status: DC
  Administered 2020-09-21 – 2020-09-29 (×20): 237 mL via ORAL

## 2020-09-21 NOTE — TOC Initial Note (Signed)
Transition of Care [facility]) - Initial/Assessment Note    Patient Details  Name: Matthew Gray MRN: [id number] Date of Birth: 05/09/1934  [facility]) CM/SW Contact:    Dessa Phi, RN Phone Number: 09/21/2020, 1:20 PM  Clinical Narrative:  Patient w/advanced dementia-spoke to dtr [name]-d/c plans she will explore options if recc SNF. Await PT cons.                 Expected Discharge Plan: Skilled Nursing Facility Barriers to Discharge: Continued Medical Work up   Patient Goals and CMS Choice Patient states their goals for this hospitalization and ongoing recovery are:: rehab CMS Medicare.gov Compare Post Acute Care list provided to:: Patient Represenative (must comment) Choice offered to / list presented to : Adult Children  Expected Discharge Plan and Services Expected Discharge Plan: [place]   Discharge Planning Services: CM Consult Post Acute Care Choice: [place] Living arrangements for the past 2 months: Single Family Home                                      Prior Living Arrangements/Services Living arrangements for the past 2 months: Single Family Home Lives with:: Adult Children Patient language and need for interpreter reviewed:: Yes Do you feel safe going back to the place where you live?: Yes      Need for Family Participation in Patient Care: No (Comment) Care giver support system in place?: Yes (comment)   Criminal Activity/Legal Involvement Pertinent to Current Situation/Hospitalization: No - Comment as needed  Activities of Daily Living Home Assistive Devices/Equipment: Dentures (specify type),Eyeglasses,Nebulizer (upper denture) ADL Screening (condition at time of admission) Patient's cognitive ability adequate to safely complete daily activities?: No Is the patient deaf or have difficulty hearing?: No Does the patient have difficulty seeing, even when wearing glasses/contacts?: No Does the patient have  difficulty concentrating, remembering, or making decisions?: Yes Patient able to express need for assistance with ADLs?: Yes Does the patient have difficulty dressing or bathing?: Yes Independently performs ADLs?: No Communication: Independent Dressing (OT): Needs assistance Is this a change from baseline?: Change from baseline, expected to last >3 days Grooming: Needs assistance Is this a change from baseline?: Change from baseline, expected to last >3 days Feeding: Needs assistance Is this a change from baseline?: Change from baseline, expected to last >3 days Bathing: Needs assistance Is this a change from baseline?: Change from baseline, expected to last >3 days Toileting: Needs assistance Is this a change from baseline?: Change from baseline, expected to last >3days In/Out Bed: Needs assistance Is this a change from baseline?: Change from baseline, expected to last >3 days Walks in Home: Needs assistance Is this a change from baseline?: Change from baseline, expected to last >3 days Does the patient have difficulty walking or climbing stairs?: Yes (secondary to weakness and shortness of breath) Weakness of Legs: Both Weakness of Arms/Hands: None  Permission Sought/Granted Permission sought to share information with : Case Manager Permission granted to share information with : Yes, Verbal Permission Granted  Share Information with NAME: Case manager     Permission granted to share info w Relationship: [name] dtr [phone]     Emotional Assessment Appearance:: Appears stated age Attitude/Demeanor/Rapport: Gracious Affect (typically observed): Accepting Orientation: : Oriented to Self Alcohol / Substance Use: Not Applicable    Admission diagnosis:  Aspiration pneumonia ([place]) [J69.0] Pneumonia of right lower  lobe due to infectious organism [J18.9] Anemia, unspecified type [D64.9] Patient Active Problem List   Diagnosis Date Noted  . Acute hypoxemic respiratory failure  ([place]) 09/19/2020  . Aspiration pneumonia ([place]) 09/19/2020  . Dysphagia 09/12/2020  . Tachycardia 09/12/2020  . Acute renal failure superimposed on chronic kidney disease ([place]) 06/11/2020  . Pressure injury of skin 05/24/2020  . Pneumonia due to COVID-19 virus 05/22/2020  . Encephalopathy due to COVID-19 virus 05/22/2020  . Microhematuria 03/17/2020  . Acute bilateral low back pain without sciatica 03/05/2020  . Constipation 03/05/2020  . Weight loss, abnormal 03/05/2020  . Closed wedge compression fracture of third lumbar vertebra ([place]) 03/05/2020  . Pneumonia of right middle lobe due to infectious organism 03/05/2020  . Abnormal findings on diagnostic imaging of lung 10/06/2019  . Urinary frequency 08/04/2019  . Community acquired pneumonia 11/02/2018  . Pneumonia 11/02/2018  . COPD exacerbation ([place]) 10/14/2018  . Influenza-like illness 10/04/2018  . Urinary incontinence 07/19/2018  . Fatigue 10/22/2017  . Hypokalemia 07/29/2017  . Driving safety issue 04/21/2017  . Acute right ankle pain 08/16/2016  . Bilateral hand pain 03/30/2016  . Bilateral hip pain 03/27/2016  . Loose stools 03/27/2016  . Low back pain 11/13/2015  . Cough 02/06/2015  . Weakness 07/19/2014  . Right inguinal hernia 03/04/2013  . Dementia, Alzheimer's, with behavior disturbance ([place]) 01/13/2013  . Obstructive chronic bronchitis without exacerbation ([place])   . Polyneuropathy in other diseases classified elsewhere ([place])   . PVD (peripheral vascular disease) ([place]) 05/04/2012  . Peripheral neuropathy 03/08/2012  . Bilateral leg pain 03/08/2012  . Peripheral edema 09/10/2011  . Encounter for well adult exam with abnormal findings 11/22/2010  . Weight loss 11/22/2010  . Anxiety 11/22/2010  . PARESTHESIA 09/02/2010  . Vitamin D deficiency 06/04/2010  . Depression 11/30/2009  . ANKLE PAIN, RIGHT 11/30/2009  . FATIGUE 11/30/2009  . PERIPHERAL EDEMA 11/16/2009  . SCIATICA, LEFT 11/08/2009  . Backache  10/08/2009  . SHOULDER PAIN, LEFT 06/21/2009  . Hemoptysis 04/24/2009  . 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 MYALGIA 02/04/2008  . Pain in Soft Tissues of Limb 02/04/2008  . Coronary atherosclerosis 10/27/2007  . PERIPHERAL VASCULAR DISEASE 10/27/2007  . Allergic rhinitis 10/27/2007  . Asthma 10/27/2007  . COPD (chronic obstructive pulmonary disease) ([place]) 10/27/2007  . BENIGN PROSTATIC HYPERTROPHY 10/27/2007  . RASH-NONVESICULAR 10/27/2007  . COLONIC POLYPS, HX OF 10/27/2007  . Hyperlipidemia 04/03/2007  . Essential hypertension 04/03/2007   PCP:  Biagio Borg, MD Pharmacy:   [facility] [place] [address] Phone: [phone] Fax: [phone]     Social Determinants of Health (SDOH) Interventions    Readmission Risk Interventions Readmission Risk Prevention Plan 09/21/2020  Transportation Screening Complete  PCP or Specialist Appt within 3-5 Days Complete  HRI or [place] Complete  Social Work Consult for [place] Planning/Counseling Complete  Palliative Care Screening Complete  Medication Review Press photographer) Complete  Some recent data might be hidden

## 2020-09-21 NOTE — ED Notes (Addendum)
Patient pulled out IV, removed all leads, BP cuff, pulse ox, gown, and primafit once again despite more frequent rounding on patient by multiple staff.  New IV placed, taped well and wrapped with coban to prevent removal.  Speech should arrive soon to assess patient.  Samtani, MD made aware patient has now removed multiple IVs and continue to pull off gown and vitals equipment. Per Samtani, place posey belt on patient and once speech has assessed patient give 2mg  IV Haldol once.  Will await speech arrive before given Haldol. Patient alarm activated as patient is confused and keeps throwing his legs over the railing, bed alarm is going off frequently.

## 2020-09-21 NOTE — ED Notes (Signed)
Patient pulled out IV which had been wrapped to prevent removal, removed all leads, BP cuff, pulse ox, gown, and primafit despite frequent rounding by multiple staff, and placing mittens on patient.  Speech has now assessed patient so new IV was placed and one time order of Haldol was given to patient per [facility], EDP. Bed alarm in patient due to patient being confused and keeps attempting to throw his legs over the bed rails.Marland Kitchen

## 2020-09-21 NOTE — ED Notes (Signed)
Patient pulled out IV, removed all leads, BP cuff, pulse ox, gown, and primafit. New IV placed and mittens placed on patient to help keep patient from removed again.  Bed alarm on.

## 2020-09-21 NOTE — Evaluation (Signed)
Physical Therapy Evaluation Patient Details Name: Matthew Gray MRN: [id number] DOB: May 03, 1934 Today's Date: 09/21/2020   History of Present Illness  Pt is 87 year old black male known history of COPD DM CAD HLD CKD 2 bladder tumor status post TURP 08/09/2020.  Pt admitted with sepsis likely secondary to aspiration PNE.  He did have COVID 10/21  Clinical Impression   Pt admitted with above diagnosis. Pt has dementia but is from home with 24 hr care.  He has assist with ADLs but is normally supervision with transfers and ambulation without AD.  PT evaluation was limited due to lethargy.  Daughter reports pt received medication due to agitation and has not recovered/still lethargic.  Today pt required mod A for transfers and to ambulate 8' with posterior lean - suspect this is medication related and pt will progress to home level as his system clears.  However, if does not progress will need SNF.  Pt currently with functional limitations due to the deficits listed below (see PT Problem List). Pt will benefit from skilled PT to increase their independence and safety with mobility to allow discharge to the venue listed below.       Follow Up Recommendations Other (comment) (SNF vs home 24 hr supv/max HH service (dtr prefers home); See PT impression)    Equipment Recommendations  Rolling walker with 5" wheels;3in1 (PT);Wheelchair cushion (measurements PT);Wheelchair (measurements PT) (may progress past w/c)    Recommendations for Other Services       Precautions / Restrictions Precautions Precautions: Fall      Mobility  Bed Mobility Overal bed mobility: Needs Assistance Bed Mobility: Supine to Sit;Sit to Supine     Supine to sit: Mod assist Sit to supine: Mod assist   General bed mobility comments: Required multimodal cues and mod A due to posterior lean    Transfers Overall transfer level: Needs assistance Equipment used: Rolling walker (2 wheeled) Transfers: Sit to/from  Stand Sit to Stand: Mod assist         General transfer comment: Mod A to rise with posterior lean  Ambulation/Gait Ambulation/Gait assistance: Mod assist Gait Distance (Feet): 6 Feet Assistive device: Rolling walker (2 wheeled) Gait Pattern/deviations: Step-to pattern;Decreased stride length;Decreased dorsiflexion - left;Decreased stance time - left Gait velocity: decreased   General Gait Details: Pt with posterior lean - provided tactile cues , assist, and verbal cues to promote forward lean but still with posterior lean; only able to take a few steps  Stairs            Wheelchair Mobility    Modified Rankin (Stroke Patients Only)       Balance Overall balance assessment: Needs assistance Sitting-balance support: Bilateral upper extremity supported Sitting balance-Leahy Scale: Poor   Postural control: Posterior lean   Standing balance-Leahy Scale: Poor Standing balance comment: Provided support at buttock and multimodal cues to decrease posterior lean but pt continued to require mod A                             Pertinent Vitals/Pain Pain Assessment: No/denies pain    Home Living Family/patient expects to be discharged to:: Private residence Living Arrangements: Children Available Help at Discharge: Family;Available 24 hours/day (pt did attend an adult daycare but has not done that since TURP in December) Type of Home: House Home Access: Stairs to enter Entrance Stairs-Rails: Psychiatric nurse of Steps: 2 Home Layout: One level Home Equipment: None  Prior Function Level of Independence: Needs assistance   Gait / Transfers Assistance Needed: Daughter reports pt able to ambulate independently without AD  ADL's / Homemaking Assistance Needed: Daughter assist with ADLs due to dementia  Comments: Previously [employer] adult daycare 5 days/wk, 8 hours/day but has not since Dec after TURP.  Daughter reports mental decline since  last surgery (Dec) but still able to mobilize     Hand Dominance        Extremity/Trunk Assessment   Upper Extremity Assessment Upper Extremity Assessment: Generalized weakness;Difficult to assess due to impaired cognition    Lower Extremity Assessment Lower Extremity Assessment: LLE deficits/detail LLE Deficits / Details: +edema - daughter reports bil LE were swelling but both significantly improved; L LE slightly weaker with gait but unable to fully assess due to confusion/lethargy; pt was able to DF andSLR on L    Cervical / Trunk Assessment Cervical / Trunk Assessment: Kyphotic  Communication   Communication: No difficulties  Cognition Arousal/Alertness: Lethargic Behavior During Therapy: WFL for tasks assessed/performed Overall Cognitive Status: Impaired/Different from baseline                                 General Comments: Pt does have hx of dementia but is more lethargic and only oriented to self. He followed commands with initiation and multimodal commands.  Daughter reports feels some deficits due to pt receiving medication last night to calm him down and has not recovered.      General Comments General comments (skin integrity, edema, etc.): Discussed pt's mobility and recommendations for d/c with daughter.  She states prefers to take him home if at all possible, and feels lack of mobility today likely due to medication/lethargy.    Exercises     Assessment/Plan    PT Assessment Patient needs continued PT services  PT Problem List Decreased strength;Decreased mobility;Decreased safety awareness;Decreased coordination;Decreased activity tolerance;Decreased cognition;Decreased balance;Decreased knowledge of use of DME       PT Treatment Interventions DME instruction;Therapeutic activities;Gait training;Therapeutic exercise;Patient/family education;Balance training;Functional mobility training;Neuromuscular re-education    PT Goals (Current goals  can be found in the Care Plan section)  Acute Rehab PT Goals Patient Stated Goal: dtr states return home PT Goal Formulation: With patient/family Time For Goal Achievement: 10/05/20 Potential to Achieve Goals: Good    Frequency Min 3X/week   Barriers to discharge        Co-evaluation               AM-PAC PT "6 Clicks" Mobility  Outcome Measure Help needed turning from your back to your side while in a flat bed without using bedrails?: A Lot Help needed moving from lying on your back to sitting on the side of a flat bed without using bedrails?: A Lot Help needed moving to and from a bed to a chair (including a wheelchair)?: A Lot Help needed standing up from a chair using your arms (e.g., wheelchair or bedside chair)?: A Lot Help needed to walk in hospital room?: A Lot Help needed climbing 3-5 steps with a railing? : A Lot 6 Click Score: 12    End of Session Equipment Utilized During Treatment: Gait belt Activity Tolerance: Patient limited by lethargy Patient left: in bed;with call bell/phone within reach;with bed alarm set;with family/visitor present Nurse Communication: Mobility status PT Visit Diagnosis: Unsteadiness on feet (R26.81);Muscle weakness (generalized) (M62.81)    Time: 1443-1540 PT Time Calculation (min) (ACUTE  ONLY): 24 min   Charges:   PT Evaluation $PT Eval Low Complexity: 1 Low PT Treatments $Gait Training: 8-22 mins        Abran Richard, PT Acute Rehab Services Pager [phone] [facility] [place] 09/21/2020, 5:59 PM

## 2020-09-21 NOTE — Progress Notes (Signed)
PROGRESS NOTE   Matthew Gray  ATF:[id number] DOB: 02/26/1934 DOA: 09/19/2020 PCP: Biagio Borg, MD  Brief Narrative:  87 year old black male known history of COPD CAD HLD CKD 2 bladder tumor status post TURBT 08/09/2020-supposed to undergo XRT under Dr. Tammi Klippel soon hyperglycemia without diagnosis of diabetes mellitus home dwelling Goes to daycare Found to have Covid pneumonia treated for the same 05/2020 Seen at PCP office 1/26 found to be weak hypoxic-daughter relates not been eating adequately apparently has been having difficulty eating and airway congestion  Sats and 70% on room air White count 36, hemoglobin 8 lactic acid 1.8 CXR = consolidation  Assessment & Plan:   Principal Problem:   Aspiration pneumonia ([place]) Active Problems:   Hyperlipidemia   Essential hypertension   COPD (chronic obstructive pulmonary disease) ([place])   Acute renal failure superimposed on chronic kidney disease ([place])   Acute hypoxemic respiratory failure ([place])   1. Sepsis 2/2 likely aspiration pneumonia a. Speech therapist recommends n.p.o. however patient tolerated dysphagia 1 diet per daughter b. Await MBS c. High risk for aspiration discussed with daughter d. Continue empiric azithromycin ceftriaxone 2. COPD a. Resume DuoNeb every 6 as needed b. Attempt flutter valve use 3. Microcytic anemia a. Component of dilutional anemia in addition b. Transfusion threshold less than 7-hold on Feraheme at this time 4. CAD a. Not on ACE/not on beta-blocker? b. continue amlodipine 5 5. AKI superimposed on CKD 2  a. Hydrate with fluids 50 cc/h NS 6. Bladder tumor status post TURBT a. Outpatient reevaluation management 7. Hyperglycemia 8. Adult failure to thrive in the setting of dementia a. Resume Aricept 10, for behavioral disturbances can use quetiapine 25 twice daily   DVT prophylaxis: Lovenox Code Status: Full code Family Communication: Long discussion with daughter Saunders Revel focus currently is  full scope care patient not ready for discussions about palliative care Disposition:  Status is: Inpatient  Remains inpatient appropriate because:Hemodynamically unstable   Dispo: The patient is from: Home              Anticipated d/c is to: SNF              Anticipated d/c date is: 2 days              Patient currently is not medically stable to d/c.   Difficult to place patient No       Consultants:   None  Procedures: None  Antimicrobials: As above   Subjective: Very sleepy received Haldol last night Daughter upset about the same Holding Seroquel at her request  Objective: Vitals:   09/20/20 2147 09/20/20 2206 09/21/20 0426 09/21/20 0838  BP:  133/85 125/86 (!) 130/92  Pulse:  80 (!) 102 100  Resp:  20 20 18   Temp:  98.1 F (36.7 C) 97.7 F (36.5 C) 98.2 F (36.8 C)  TempSrc:  Oral Oral Oral  SpO2:  94% 99% 100%  Weight: 49.8 kg     Height:        Intake/Output Summary (Last 24 hours) at 09/21/2020 1241 Last data filed at 09/21/2020 1045 Gross per 24 hour  Intake 1536.09 ml  Output 1000 ml  Net 536.09 ml   Filed Weights   09/19/20 1717 09/20/20 2147  Weight: 49 kg 49.8 kg    Examination:  Sleepy and coherent no distress EOMI NCAT Coarse breath sounds bilaterally S1-S2 no murmur Abdomen soft  Data Reviewed: I have personally reviewed following labs and imaging studies  Potassium 3.3 BUN/creatinine 21/1.1-->17/0.6  White count 35.4-->33.2 Hemoglobin 7.8-->7.6 Platelet 403  COVID-19 Labs  No results for input(s): DDIMER, FERRITIN, LDH, CRP in the last 72 hours.  Lab Results  Component Value Date   SARSCOV2NAA NEGATIVE 09/19/2020   SARSCOV2NAA Detected (A) 06/11/2020   SARSCOV2NAA POSITIVE (A) 05/21/2020     Radiology Studies: [facility] Chest Port 1 View  Result Date: 09/19/2020 CLINICAL DATA:  [name] 2 days ago, PCP, COPD EXAM: PORTABLE CHEST 1 VIEW COMPARISON:  05/21/2020 FINDINGS: Single frontal view of the chest demonstrates an  unremarkable cardiac silhouette. There is background emphysema, with bibasilar airspace disease greatest on the right. Small right pleural effusion is suspected. No pneumothorax. No acute bony abnormalities. IMPRESSION: 1. Bibasilar consolidation, right greater than left, favor airspace disease over atelectasis. 2. Small right pleural effusion. 3. Emphysema. Electronically Signed   By: Randa Ngo M.D.   On: 09/19/2020 17:24     Scheduled Meds: Continuous Infusions:   LOS: 2 days    Time spent: [place], MD [facility] To contact the attending provider between 7A-7P or the covering provider during after hours 7P-7A, please log into the web site [email or web address] and access using universal [place] password for that web site. If you do not have the password, please call the hospital operator.  09/21/2020, 12:41 PM

## 2020-09-21 NOTE — Progress Notes (Signed)
Initial Nutrition Assessment  DOCUMENTATION CODES:   Severe malnutrition in context of chronic illness  INTERVENTION:   -Ensure Enlive TID (8 fl oz, each supplement provides 350 kcal, 20 g protein), can utilize to provide PO medications  NUTRITION DIAGNOSIS:   Severe Malnutrition related to cancer and cancer related treatments,chronic illness as evidenced by estimated needs,percent weight loss (9% weight loss in 3 months.).  GOAL:   Patient will meet greater than or equal to 90% of their needs,Weight gain  MONITOR:   PO intake,Weight trends,Supplement acceptance,Labs  REASON FOR ASSESSMENT:   Other (Comment) (BMI <18.2)    ASSESSMENT:   41 YOM with was seen at PCP for generalized weakness, congestion, cough and was sent to ED for hypoxemia. Pt was admitted for aspiration pneumonia. PMH of bladder cancer s/p TURBT (plan for XRT, but has not started treatment), HLD, CKD-3, COPD, CAD, recurrent pneumonia.  [name] daughter was in pt's room and provided information on pt's behalf. [name] daughter indicated that pt does typically eat well at home. Pt does eat a pureed diet at home and she indicated that his meals typically consist of a protein, starch and vegetable. [name] daughter mentioned that pt has had ONS Ensure at home. [name] note indicates that pt consumed 100% of breakfast on 01/28.  [name] daughter denied that pt has any trouble chewing or swallowing. Pt was previously consuming regularly consistency food, but daughter realized he wasn't thoroughly chewing food and was pocketing food for hours. Due to this, daughter started altering the texture of his foods, since this daughter has noticed pt is no longer pocketing and is able to swallow without difficulty.  Pt did have an MBS done 09/21/20, diet recommendation from this was thin liquid and dysphagia 1 (pureed) solids.   [name] daughter indicated that pt is able to feed himself, however has needed assistance in the hospital d/t mittens  to prevent pt from removing IVs.   [name] daughter did mention that PTA pt was mobile and did not need assistance from a walker or a wheelchair.   [name] daughter indicated that pt's UBW was 150# before his decline, however she mentioned that he now weighs approximately 110#. [name] weights indicated that on 09/14/20 pt weighed 110#. Prior to this weight, pt weight 116# on 08/09/20, 120# on 06/11/20 and 150# on 05/21/20. Pt experienced a weight loss of 9% in 3 months which is significant.  Meds Reviewed: Klor-Con (40 mEq, daily), 0.9% sodium chloride infusion (50 mL/hr continuous)  Labs Reviewed: potassium (3.3 mmol/L), calcium (8.4 mg/dL)  NUTRITION - FOCUSED PHYSICAL EXAM:  Flowsheet Row Most Recent Value  Orbital Region Severe depletion  Upper Arm Region Moderate depletion  Thoracic and Lumbar Region Moderate depletion  Buccal Region Severe depletion  Temple Region Severe depletion  Clavicle Bone Region Severe depletion  Clavicle and Acromion Bone Region Severe depletion  Scapular Bone Region Severe depletion  Dorsal Hand Moderate depletion  Patellar Region Moderate depletion  Anterior Thigh Region Severe depletion  Posterior Calf Region Severe depletion  Edema (RD Assessment) Mild  [left lower extremity.]  Hair Reviewed  Eyes Unable to assess  Mouth Unable to assess  Skin Reviewed  Nails Reviewed       Diet Order:   Diet Order            DIET - DYS 1 Room service appropriate? Yes; Fluid consistency: Thin  Diet effective now                 EDUCATION  NEEDS:   No education needs have been identified at this time  Skin:  Skin Assessment: Reviewed RN Assessment  Last BM:  09/19/20 (type 3)  Height:   Ht Readings from Last 1 Encounters:  09/19/20 5\' 8"  (1.727 m)    Weight:   Wt Readings from Last 1 Encounters:  09/20/20 49.8 kg    Ideal Body Weight:  70 kg  BMI:  Body mass index is 16.7 kg/m.  Estimated Nutritional Needs:   Kcal:  1550-1750    Protein:  75-90   Fluid:  >1.5 L   Salvadore Oxford, Dietetic Intern 09/21/2020 4:19 PM

## 2020-09-21 NOTE — Progress Notes (Signed)
PROGRESS NOTE   Matthew Gray  VQM:[id number] DOB: 1934-07-12 DOA: 09/19/2020 PCP: Biagio Borg, MD  Brief Narrative:  87 year old black male known history of COPD CAD HLD CKD 2 bladder tumor status post TURBT 08/09/2020-supposed to undergo XRT under Dr. Tammi Klippel soon hyperglycemia without diagnosis of diabetes mellitus home dwelling Goes to daycare Found to have Covid pneumonia treated for the same 05/2020 Seen at PCP office 1/26 found to be weak hypoxic-daughter relates not been eating adequately apparently has been having difficulty eating and airway congestion  Sats and 70% on room air White count 36, hemoglobin 8 lactic acid 1.8 CXR = consolidation  Assessment & Plan:   Principal Problem:   Aspiration pneumonia ([place]) Active Problems:   Hyperlipidemia   Essential hypertension   COPD (chronic obstructive pulmonary disease) ([place])   Acute renal failure superimposed on chronic kidney disease ([place])   Acute hypoxemic respiratory failure ([place])   1. Sepsis 2/2 likely aspiration pneumonia a. SLP recs NPO, but apprently tolerated dys 1 diet per daughter--await MBS b. Patient did not receive antibiotics on 1/28 likely accounting for high fevers c. Fully discussed with family and apologize for the same to family and explained sometimes 1  d. Have started with broader coverage cefepime Flagyl to de-escalate over the next several days depending on course e. Have explained  natural history of aspiration to the daughter on multiple attempts 2. COPD a. Resume DuoNeb every 6 as needed b. Attempt flutter valve use as able 3. CAD a. Not on ACE/not on beta-blocker? b. Outpatient reeval can continue amlodipine 5 4. AKI superimposed on CKD 2  a. Hydrate with fluids 50 cc/h NS b. r continue with labs in the outpatient setting 5. Bladder tumor status post TURBT a. Outpatient reevaluation management 6. Microcytic anemia with hemodulition 7. Transfuse if below 7 a. Hold fereheme for right  now 8. Hyperglycemia 9. Adult failure to thrive in the setting of dementia a. Resume Aricept 10, hold seroquel as sleepy today   DVT prophylaxis: Lovenox Code Status: Full code Family Communication: Long discussion with daughter Jeannie Done at the bedside-focus full scope of care Disposition:  Status is: Inpatient  Remains inpatient appropriate because:Hemodynamically unstable   Dispo: The patient is from: Home              Anticipated d/c is to: SNF              Anticipated d/c date is: 2 days              Patient currently is not medically stable to d/c.   Difficult to place patient No       Consultants:   None  Procedures: None  Antimicrobials: As above   Subjective:  Patient.  No distress events noted overnight with fever Patient is somewhat diaphoretic No chest pain no fever-tolerated full diet this morning of dysphagia 1 and finished his food apparently Daughter at bedside  Objective: Vitals:   09/20/20 2147 09/20/20 2206 09/21/20 0426 09/21/20 0838  BP:  133/85 125/86 (!) 130/92  Pulse:  80 (!) 102 100  Resp:  20 20 18   Temp:  98.1 F (36.7 C) 97.7 F (36.5 C) 98.2 F (36.8 C)  TempSrc:  Oral Oral Oral  SpO2:  94% 99% 100%  Weight: 49.8 kg     Height:        Intake/Output Summary (Last 24 hours) at 09/21/2020 1223 Last data filed at 09/21/2020 1045 Gross per 24 hour  Intake 1536.09 ml  Output 1000 ml  Net 536.09 ml   Filed Weights   09/19/20 1717 09/20/20 2147  Weight: 49 kg 49.8 kg    Examination:  Frail cachectic white male slightly diaphoretic No added sound no rales no rhonchi Abdomen soft nontender no rebound no guarding ROM intact moving 4 limbs equally without deficit and no restriction Power 5/5  Data Reviewed: I have personally reviewed following labs and imaging studies  k 3.3-->4.1 Bun /cr 21/1.15-->17/0.6-9 point 9/23/1 0.1 Wbc 35-->33.2-->34.5 Hemoglobin 7.6-->8.0  COVID-19 Labs  No results for input(s): DDIMER,  FERRITIN, LDH, CRP in the last 72 hours.  Lab Results  Component Value Date   SARSCOV2NAA NEGATIVE 09/19/2020   SARSCOV2NAA Detected (A) 06/11/2020   SARSCOV2NAA POSITIVE (A) 05/21/2020     Radiology Studies: [facility] Chest Port 1 View  Result Date: 09/19/2020 CLINICAL DATA:  [name] 2 days ago, PCP, COPD EXAM: PORTABLE CHEST 1 VIEW COMPARISON:  05/21/2020 FINDINGS: Single frontal view of the chest demonstrates an unremarkable cardiac silhouette. There is background emphysema, with bibasilar airspace disease greatest on the right. Small right pleural effusion is suspected. No pneumothorax. No acute bony abnormalities. IMPRESSION: 1. Bibasilar consolidation, right greater than left, favor airspace disease over atelectasis. 2. Small right pleural effusion. 3. Emphysema. Electronically Signed   By: Randa Ngo M.D.   On: 09/19/2020 17:24     Scheduled Meds: Continuous Infusions:   LOS: 2 days    Time spent: [place], MD [facility] To contact the attending provider between 7A-7P or the covering provider during after hours 7P-7A, please log into the web site [email or web address] and access using universal [place] password for that web site. If you do not have the password, please call the hospital operator.  09/21/2020, 12:23 PM

## 2020-09-21 NOTE — Progress Notes (Signed)
Modified Barium Swallow Progress Note  Patient Details  Name: Matthew Gray MRN: [id number] Date of Birth: Jun 01, 1934  Today's Date: 09/21/2020  Modified Barium Swallow completed.  Full report located under Chart Review in the Imaging Section.  Brief recommendations include the following:  Clinical Impression Pt presents with moderate oropharyngeal dysphagia, characterized as follows:  Orally, pt exhibits poor bolus formation with lingual pumping, piecemeal deglutition, and premature spillage over the tongue base. Pt did not have upper dentures in place during this evaluation, and was noted to require a significant amount of time to chew graham cracker pieces. Oral residue was noted, requiring puree to facilitate clearing of cracker residual. Slight lingual residue was seen across consistencies. Pt had difficulty generating a dry swallow to clear oral residue, which was noted to spill to the vallecula after the swallow.   Pharyngeal swallow is characterized by trigger of the swallow reflex at the level of the vallecula on puree and solid textures, and at the pyriform sinus on thin and nectar thick liquids. Despite this significant delay, no penetration or aspiration was seen on any consistency, even when taxed with large and /or consecutive boluses. There was no post-swallow residue of any consistency.  Recommend continuing with puree textures and thin liquids, with meds crushed in puree. Pt will benefit from assistance with feeding, and should have his oral cavity checked after meals to insure clearing. Pt is at risk for aspiration due to cognitive impairment, oral issues, and swallow reflex delay, despite lack of penetration or aspiration on this study. He is also at risk for inadequate PO intake. Supplements may be helpful. Safe swallow precautions were sent with pt to be posted at [facility]. SLP will continue to follow acutely to assess diet tolerance and continue education.   Swallow Evaluation  Recommendations  SLP Diet Recommendations: Thin liquid;Dysphagia 1 (Puree) solids   Liquid Administration via: Straw   Medication Administration: Crushed with puree   Supervision: Patient able to self feed;Full assist for feeding;Full supervision/cueing for compensatory strategies   Compensations: Slow rate;Small sips/bites;Minimize environmental distractions   Postural Changes: Remain semi-upright after after feeds/meals (Comment);Seated upright at 90 degrees   Oral Care Recommendations: Oral care QID      Nelida Mandarino B. Quentin Ore, [facility], [place] Speech Language Pathologist Office: [phone] Pager: [phone]  Shonna Chock 09/21/2020,1:52 PM

## 2020-09-22 DIAGNOSIS — E43 Unspecified severe protein-calorie malnutrition: Secondary | ICD-10-CM | POA: Insufficient documentation

## 2020-09-22 DIAGNOSIS — J690 Pneumonitis due to inhalation of food and vomit: Secondary | ICD-10-CM | POA: Diagnosis not present

## 2020-09-22 LAB — CBC WITH DIFFERENTIAL/PLATELET
Abs Immature Granulocytes: 0.34 10*3/uL — ABNORMAL HIGH (ref 0.00–0.07)
Basophils Absolute: 0.1 10*3/uL (ref 0.0–0.1)
Basophils Relative: 0 %
Eosinophils Absolute: 0.1 10*3/uL (ref 0.0–0.5)
Eosinophils Relative: 0 %
HCT: 27.1 % — ABNORMAL LOW (ref 39.0–52.0)
Hemoglobin: 8 g/dL — ABNORMAL LOW (ref 13.0–17.0)
Immature Granulocytes: 1 %
Lymphocytes Relative: 3 %
Lymphs Abs: 1 10*3/uL (ref 0.7–4.0)
MCH: 22 pg — ABNORMAL LOW (ref 26.0–34.0)
MCHC: 29.5 g/dL — ABNORMAL LOW (ref 30.0–36.0)
MCV: 74.7 fL — ABNORMAL LOW (ref 80.0–100.0)
Monocytes Absolute: 1 10*3/uL (ref 0.1–1.0)
Monocytes Relative: 3 %
Neutro Abs: 31.9 10*3/uL — ABNORMAL HIGH (ref 1.7–7.7)
Neutrophils Relative %: 93 %
Platelets: 408 10*3/uL — ABNORMAL HIGH (ref 150–400)
RBC: 3.63 MIL/uL — ABNORMAL LOW (ref 4.22–5.81)
RDW: 16.9 % — ABNORMAL HIGH (ref 11.5–15.5)
WBC: 34.5 10*3/uL — ABNORMAL HIGH (ref 4.0–10.5)
nRBC: 0 % (ref 0.0–0.2)

## 2020-09-22 LAB — COMPREHENSIVE METABOLIC PANEL
ALT: 13 U/L (ref 0–44)
AST: 26 U/L (ref 15–41)
Albumin: 2.3 g/dL — ABNORMAL LOW (ref 3.5–5.0)
Alkaline Phosphatase: 109 U/L (ref 38–126)
Anion gap: 9 (ref 5–15)
BUN: 23 mg/dL (ref 8–23)
CO2: 24 mmol/L (ref 22–32)
Calcium: 8.4 mg/dL — ABNORMAL LOW (ref 8.9–10.3)
Chloride: 113 mmol/L — ABNORMAL HIGH (ref 98–111)
Creatinine, Ser: 1.11 mg/dL (ref 0.61–1.24)
GFR, Estimated: >60 mL/min (ref >60)
Glucose, Bld: 105 mg/dL — ABNORMAL HIGH (ref 70–99)
Potassium: 4.1 mmol/L (ref 3.5–5.1)
Sodium: 146 mmol/L — ABNORMAL HIGH (ref 135–145)
Total Bilirubin: 0.6 mg/dL (ref 0.3–1.2)
Total Protein: 6.1 g/dL — ABNORMAL LOW (ref 6.5–8.1)

## 2020-09-22 MED ORDER — METRONIDAZOLE IN NACL 5-0.79 MG/ML-% IV SOLN
500.0000 mg | Freq: Three times a day (TID) | INTRAVENOUS | Status: DC
  Administered 2020-09-22 – 2020-09-25 (×10): 500 mg via INTRAVENOUS
  Filled 2020-09-22 (×10): qty 100

## 2020-09-22 MED ORDER — SODIUM CHLORIDE 0.9 % IV SOLN
2.0000 g | Freq: Two times a day (BID) | INTRAVENOUS | Status: DC
  Administered 2020-09-22 – 2020-09-25 (×7): 2 g via INTRAVENOUS
  Filled 2020-09-22 (×3): qty 2
  Filled 2020-09-22: qty 0.9
  Filled 2020-09-22 (×4): qty 2

## 2020-09-22 NOTE — Progress Notes (Signed)
   09/21/20 2322  Assess: MEWS Score  Temp (!) 102 F (38.9 C)  BP 137/90  Pulse Rate (!) 110  Resp 16  SpO2 98 %  O2 Device Room Air  Assess: MEWS Score  MEWS Temp 2  MEWS Systolic 0  MEWS Pulse 1  MEWS RR 0  MEWS LOC 0  MEWS Score 3  MEWS Score Color Yellow  Assess: if the MEWS score is Yellow or Red  Were vital signs taken at a resting state? Yes  Focused Assessment No change from prior assessment  Early Detection of Sepsis Score *See Row Information* High  MEWS guidelines implemented *See Row Information* Yes  Treat  MEWS Interventions Escalated (See documentation below)  Pain Scale 0-10  Pain Score 0  Take Vital Signs  Increase Vital Sign Frequency  Yellow: Q 2hr X 2 then Q 4hr X 2, if remains yellow, continue Q 4hrs  Escalate  MEWS: Escalate Yellow: discuss with charge nurse/RN and consider discussing with provider and RRT  Notify: Charge Nurse/RN  Name of Charge Nurse/RN Notified Pamila RN  Date Charge Nurse/RN Notified 09/21/20  Time Charge Nurse/RN Notified 2326  Notify: Provider  Provider Name/Title Jeannette Corpus  Date Provider Notified 09/21/20  Time Provider Notified 2326  Notification Type Page  Notification Reason Change in status  Response See new orders  Date of Provider Response 09/22/20  Time of Provider Response 2328

## 2020-09-22 NOTE — Progress Notes (Signed)
Pharmacy Antibiotic Note  Matthew Gray is a 87 y.o. male admitted on 09/19/2020 with aspiration pneumonia.  Pharmacy has been consulted for cefepime dosing.  Plan: Flagyl 500 mg iv q 8 hours per MD  Cefepime 2 g iv q 12 hours - F/U renal function, culture results, and clinical course  Height: 5\' 8"  (172.7 cm) Weight: 49.8 kg (109 lb 12.8 oz) IBW/kg (Calculated) : 68.4  Temp (24hrs), Avg:100 F (37.8 C), Min:98.4 F (36.9 C), Max:102 F (38.9 C)  Recent Labs  Lab 09/19/20 1724 09/19/20 1739 09/19/20 2233 09/20/20 0822 09/21/20 0608 09/22/20 0558  WBC 36.0*  --   --  35.4* 33.2* 34.5*  CREATININE 1.21  --   --  1.15 0.67 1.11  LATICACIDVEN  --  1.8 0.9  --   --   --     Estimated Creatinine Clearance: 34.9 mL/min (by C-G formula based on SCr of 1.11 mg/dL).    Allergies  Allergen Reactions  . Atorvastatin Other (See Comments)    REACTION: myalgias  . Simvastatin Other (See Comments)    REACTION: myalgias  . Zetia [Ezetimibe] Other (See Comments)    weakness  . Ace Inhibitors Other (See Comments)    cough  . Penicillins Other (See Comments)    Unknown; Tolerated Ceftriaxone on 10/2018    Antimicrobials this admission: 1/26 ceftriaxone x 1 1/26 azithromycin x 1 1/29 cefepime >>  1/29 flagyl >>   Dose adjustments this admission:  Microbiology results: 1/26 BCx: ngtd 1/26COVID: neg  Thank you for allowing pharmacy to be a part of this patient's care.  [name] D 09/22/2020 8:46 AM

## 2020-09-22 NOTE — Progress Notes (Signed)
Pt remained afebrile throughout day shift. IV abx given as scheduled . SQ heparin. Daughter at bedside when MD rounded & discussed plan of care. Pt oriented to self & daughter Disoriented otherwise. Very lethargic. Educated family on importance of sitting 90 degrees when eating & 45 min after meals & the importance of a dysphagia diet.  Pt not restless or pulling at lines today-hand mits removed at 1730, daughter aware. Pt skin/wrist-intact, color appropriate & n swelling noted. Will reconsider if needed in future.

## 2020-09-23 ENCOUNTER — Inpatient Hospital Stay (HOSPITAL_COMMUNITY): Payer: Medicare HMO

## 2020-09-23 DIAGNOSIS — J690 Pneumonitis due to inhalation of food and vomit: Secondary | ICD-10-CM | POA: Diagnosis not present

## 2020-09-23 LAB — CBC WITH DIFFERENTIAL/PLATELET
Abs Immature Granulocytes: 0.52 10*3/uL — ABNORMAL HIGH (ref 0.00–0.07)
Basophils Absolute: 0.1 10*3/uL (ref 0.0–0.1)
Basophils Relative: 0 %
Eosinophils Absolute: 0 10*3/uL (ref 0.0–0.5)
Eosinophils Relative: 0 %
HCT: 26.9 % — ABNORMAL LOW (ref 39.0–52.0)
Hemoglobin: 8.1 g/dL — ABNORMAL LOW (ref 13.0–17.0)
Immature Granulocytes: 1 %
Lymphocytes Relative: 3 %
Lymphs Abs: 1.2 10*3/uL (ref 0.7–4.0)
MCH: 21.9 pg — ABNORMAL LOW (ref 26.0–34.0)
MCHC: 30.1 g/dL (ref 30.0–36.0)
MCV: 72.7 fL — ABNORMAL LOW (ref 80.0–100.0)
Monocytes Absolute: 1.1 10*3/uL — ABNORMAL HIGH (ref 0.1–1.0)
Monocytes Relative: 3 %
Neutro Abs: 34.5 10*3/uL — ABNORMAL HIGH (ref 1.7–7.7)
Neutrophils Relative %: 93 %
Platelets: 411 10*3/uL — ABNORMAL HIGH (ref 150–400)
RBC: 3.7 MIL/uL — ABNORMAL LOW (ref 4.22–5.81)
RDW: 17.2 % — ABNORMAL HIGH (ref 11.5–15.5)
WBC: 37.4 10*3/uL — ABNORMAL HIGH (ref 4.0–10.5)
nRBC: 0 % (ref 0.0–0.2)

## 2020-09-23 LAB — COMPREHENSIVE METABOLIC PANEL
ALT: 12 U/L (ref 0–44)
AST: 16 U/L (ref 15–41)
Albumin: 2.2 g/dL — ABNORMAL LOW (ref 3.5–5.0)
Alkaline Phosphatase: 96 U/L (ref 38–126)
Anion gap: 10 (ref 5–15)
BUN: 21 mg/dL (ref 8–23)
CO2: 26 mmol/L (ref 22–32)
Calcium: 8.4 mg/dL — ABNORMAL LOW (ref 8.9–10.3)
Chloride: 107 mmol/L (ref 98–111)
Creatinine, Ser: 0.89 mg/dL (ref 0.61–1.24)
GFR, Estimated: >60 mL/min (ref >60)
Glucose, Bld: 96 mg/dL (ref 70–99)
Potassium: 3.4 mmol/L — ABNORMAL LOW (ref 3.5–5.1)
Sodium: 143 mmol/L (ref 135–145)
Total Bilirubin: 0.5 mg/dL (ref 0.3–1.2)
Total Protein: 5.9 g/dL — ABNORMAL LOW (ref 6.5–8.1)

## 2020-09-23 NOTE — Progress Notes (Signed)
PROGRESS NOTE   Matthew Gray  [id number]:[id number] DOB: 18-Oct-1933 DOA: 09/19/2020 PCP: Biagio Borg, MD  Brief Narrative:  87 year old black male known history of COPD CAD HLD CKD 2 bladder tumor status post TURBT 08/09/2020-supposed to undergo XRT under Dr. Tammi Klippel soon hyperglycemia without diagnosis of diabetes mellitus home dwelling Goes to daycare Found to have Covid pneumonia treated for the same 05/2020 Seen at PCP office 1/26 found to be weak hypoxic-daughter relates not been eating adequately apparently has been having difficulty eating and airway congestion  Sats and 70% on room air White count 36, hemoglobin 8 lactic acid 1.8 CXR = consolidation  Assessment & Plan:   Principal Problem:   Aspiration pneumonia ([place]) Active Problems:   Hyperlipidemia   Essential hypertension   COPD (chronic obstructive pulmonary disease) ([place])   Acute renal failure superimposed on chronic kidney disease ([place])   Acute hypoxemic respiratory failure ([place])   Protein-calorie malnutrition, severe   1. Sepsis 2/2 likely aspiration pneumonia a. Speech graduated to diet to dysphagia 1 careful monitor allow feeds although some coughing earlier today b. Patient did not receive antibiotics on 1/28 likely accounting for high fevers c. Continues on cefepime and Flagyl IV monitor trends of fever curve d. Continue saline 50 cc/H e. Chest x-ray two-view from 1/30 shows improved infiltrates right side with persisting left-sided infiltrate 2. COPD a. Resume DuoNeb every 6 as needed b. Attempt flutter valve use as able 3. CAD a. Not on ACE/not on beta-blocker? b. Outpatient reeval can continue amlodipine 5 4. AKI superimposed on CKD 2  a. Hydrate with fluids 50 cc/h NS b. Stable at this time 5. Bladder tumor status post TURBT a. Outpatient reevaluation management 6. Microcytic anemia with hemodulition 7. Transfuse if below 7 a. Hold fereheme for right now b. Outpatient 8. Hyperglycemia a. CBGs ranging  96-100 but probably not eating 9. Adult failure to thrive in the setting of dementia a. Resume Aricept 10, hold seroquel as sleepy today   DVT prophylaxis: Lovenox Code Status: Full code Family Communication: Long discussion with daughter Jeannie Done at the bedside-focus full scope of care Disposition:  Status is: Inpatient  Remains inpatient appropriate because:Hemodynamically unstable   Dispo: The patient is from: Home              Anticipated d/c is to: Home daughter insisted on taking patient home              Anticipated d/c date is: 2 days              Patient currently is not medically stable to d/c.   Difficult to place patient No       Consultants:   None  Procedures: None  Antimicrobials: As above   Subjective:  Doing fair no nausea no vomiting but coughing this morning and seemed a little bit more shallow in terms of breathing No chest pain no fever Incoherent  Objective: Vitals:   09/22/20 2143 09/23/20 0146 09/23/20 0620 09/23/20 0923  BP: 138/87 (!) 141/94 139/89 (!) 122/95  Pulse: (!) 102 (!) 102 100 93  Resp: 20 20 18  (!) 40  Temp: 98.7 F (37.1 C) 99.7 F (37.6 C) (!) 100.5 F (38.1 C) 98 F (36.7 C)  TempSrc: Axillary Axillary Axillary Oral  SpO2: 100% 98% 98% 100%  Weight:      Height:        Intake/Output Summary (Last 24 hours) at 09/23/2020 1100 Last data filed at 09/23/2020 0600 Gross per 24 hour  Intake 680 ml  Output 1400 ml  Net -720 ml   Filed Weights   09/19/20 1717 09/20/20 2147  Weight: 49 kg 49.8 kg    Examination:  EOMI NCAT oriented x1 Mild rales posterolaterally Abdomen soft no rebound no guarding No lower extremity edema mucosa dry Neurologically intact moving all 4 limbs without deficit Mental status he is confused but more alert than prior  Data Reviewed: I have personally reviewed following labs and imaging studies  k 3.3-->4.1-->3.4 Bun /cr 21/1.15-->17/0.9-->21/0.8 Wbc 35-->33.2-->34.5-->37.4 Hemoglobin  7.6-->8.0  COVID-19 Labs  No results for input(s): DDIMER, FERRITIN, LDH, CRP in the last 72 hours.  Lab Results  Component Value Date   SARSCOV2NAA NEGATIVE 09/19/2020   SARSCOV2NAA Detected (A) 06/11/2020   SARSCOV2NAA POSITIVE (A) 05/21/2020     Radiology Studies: [facility] Swallowing Func-Speech Pathology  Result Date: 09/21/2020 Objective Swallowing Evaluation: Type of Study: MBS-Modified Barium Swallow Study  Patient Details Name: Myzel Tillis MRN: [id number] Date of Birth: [birth year] Today's Date: 09/21/2020 Time: SLP Start Time (ACUTE ONLY): [id number] -SLP Stop Time (ACUTE ONLY): [id number] SLP Time Calculation (min) (ACUTE ONLY): 20 min Past Medical History: Past Medical History: Diagnosis Date . Advanced dementia ([place]) 2014  followed by pcp---  previously had seen neurologist, dr Krista Blue . Allergic rhinitis  . Bladder cancer [facility])   urologist--- dr Louis Meckel . BPH (benign prostatic hyperplasia)  . Chronic cough   per pt daughter, pt has always had cough long before had covid  . CKD (chronic kidney disease), stage III ([place])  . Congestion of respiratory tract   post covid ,  per pt daughter mostly in morning time after doing nebulizer and productive . COPD with asthma [facility])   pulmologist-- dr Ina Homes--  mild intermittant asthma--- hx byssinosis and recurrent pneumonia . Coronary artery disease   cardiac cath 08-11-2001 in epic,  very minimal nonobstructive cad and luminal irregularities, normal lvsf . DDD (degenerative disc disease), lumbar  . Depression  . 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 Hematuria  . History of 2019 novel coronavirus disease (COVID-19) 05-21-2020 dx  hospitalized and discharged 05-27-2020, covid pneumonia, only on oxygen  . History of adenomatous polyp of colon  . History of recurrent pneumonia  . History of sepsis 10/2018  due to pneumonia . History of vertebral compression fracture   L3 . Hyperlipidemia  . Hypertension   followed by pcp . Left leg  swelling   08-08-2020 per pt daughter , not red or does not feel warm . Peripheral neuropathy  . Peripheral vascular disease ([place])  . Urinary incontinence, mixed  . Wears dentures   upper Past Surgical History: Past Surgical History: Procedure Laterality Date . CARDIAC CATHETERIZATION  08-11-2001  @[facility]   very minmial nonobstructive cad, normal lvsf . CYSTOSCOPY W/ RETROGRADES N/A 08/09/2020  Procedure: CYSTOSCOPY WITH BILATERAL RETROGRADE PYELOGRAM;  Surgeon: Ardis Hughs, MD;  Location[facility];  Service: Urology;  Laterality: N/A; . NO PAST SURGERIES   . TRANSURETHRAL RESECTION OF BLADDER TUMOR N/A 08/09/2020  Procedure: TRANSURETHRAL RESECTION OF BLADDER TUMOR (TURBT);  Surgeon: Ardis Hughs, MD;  Location: [facility];  Service: Urology;  Laterality: N/A; HPI: pt is an 87 yo male admitted to [facility] after fall - diagnosed with FTT and pna *right more than left.  Pt has h/o dementia, recurrent pnas, chronic bronchitis.  Pt has undergone MBS in thte past with findings of mild dysphagia - coughing throughout testing without infiltration  of barium into larynx/trachea.  Pt repeated prior CXRs 03/05/2020, 03/2020, 04/2020 and 08/2020 showing concern for recurrent pnas.  Pt today admits to dysphagia prior to admission - coughing with liquids more than with foods.  Pt is a full code at this time.  Subjective: Pt seen in radiology for MBS Assessment / Plan / Recommendation CHL IP CLINICAL IMPRESSIONS 09/21/2020 Clinical Impression Pt presents with moderate oropharyngeal dysphagia, characterized as follows: Orally, pt exhibits poor bolus formation with lingual pumping, piecemeal deglutition, and premature spillage over the tongue base. Pt did not have upper dentures in place during this evaluation, and was noted to require a significant amount of time to chew graham cracker pieces. Oral residue was noted, requiring puree to facilitate clearing of cracker residual. Slight lingual  residue was seen across consistencies. Pt had difficulty generating a dry swallow to clear oral residue, which was noted to spill to the vallecula after the swallow. Pharyngeal swallow is characterized by trigger of the swallow reflex at the level of the vallecula on puree and solid textures, and at the pyriform sinus on thin and nectar thick liquids. Despite this significant delay, no penetration or aspiration was seen on any consistency, even when taxed with large and /or consecutive boluses. There was no post-swallow residue of any consistency. Recommend continuing with puree textures and thin liquids, with meds crushed in puree. Pt will benefit from assistance with feeding, and should have his oral cavity checked after meals to insure clearing. Pt is at risk for aspiration due to cognitive impairment, oral issues, and swallow reflex delay, despite lack of penetration or aspiration on this study. He is also at risk for inadequate PO intake. Supplements may be helpful. Safe swallow precautions were sent with pt to be posted at [facility]. SLP will continue to follow acutely to assess diet tolerance and continue education.  SLP Visit Diagnosis Dysphagia, oropharyngeal phase (R13.12)     Impact on safety and function Moderate aspiration risk;Risk for inadequate nutrition/hydration   CHL IP TREATMENT RECOMMENDATION 09/21/2020 Treatment Recommendations Therapy as outlined in treatment plan below   Prognosis 09/21/2020 Prognosis for Safe Diet Advancement Guarded Barriers to Reach Goals Cognitive deficits   CHL IP DIET RECOMMENDATION 09/21/2020 SLP Diet Recommendations Thin liquid;Dysphagia 1 (Puree) solids Liquid Administration via Straw Medication Administration Crushed with puree Compensations Slow rate;Small sips/bites;Minimize environmental distractions Postural Changes Remain semi-upright after after feeds/meals (Comment);Seated upright at 90 degrees   CHL IP OTHER RECOMMENDATIONS 09/21/2020   Oral Care Recommendations Oral  care QID     CHL IP FOLLOW UP RECOMMENDATIONS 09/21/2020 Follow up Recommendations 24 hour supervision/assistance   CHL IP FREQUENCY AND DURATION 09/21/2020 Speech Therapy Frequency (ACUTE ONLY) min 2x/week Treatment Duration 1 week;2 weeks      CHL IP ORAL PHASE 09/21/2020 Oral Phase Impaired   Oral - Nectar Teaspoon Weak lingual manipulation;Premature spillage;Delayed oral transit   Oral - Nectar Straw Lingual pumping;Delayed oral transit;Premature spillage;Piecemeal swallowing;Lingual/palatal residue   Oral - Thin Straw Lingual pumping;Premature spillage;Delayed oral transit;Piecemeal swallowing Oral - Puree Lingual pumping;Premature spillage;Delayed oral transit;Piecemeal swallowing;Lingual/palatal residue Oral - Mech Soft Premature spillage;Decreased bolus cohesion;Delayed oral transit;Piecemeal swallowing;Lingual/palatal residue    CHL IP PHARYNGEAL PHASE 09/21/2020 Pharyngeal Phase Impaired   Pharyngeal- Nectar Teaspoon Delayed swallow initiation-vallecula Pharyngeal- Nectar Straw Delayed swallow initiation-pyriform sinuses Pharyngeal- Thin Straw Delayed swallow initiation-pyriform sinuses Pharyngeal- Puree Delayed swallow initiation-vallecula Pharyngeal- Mechanical Soft Delayed swallow initiation-vallecula    CHL IP CERVICAL ESOPHAGEAL PHASE 09/21/2020 Cervical Esophageal Phase [facility] Celia B. Bueche, [place], CCC-SLP  Speech Language Pathologist Office: [phone] Pager: [phone] Shonna Chock 09/21/2020, 1:41 PM                Scheduled Meds: Continuous Infusions:   LOS: 4 days    Time spent: 38  Nita Sells, MD [facility] To contact the attending provider between 7A-7P or the covering provider during after hours 7P-7A, please log into the web site [email or web address] and access using universal Page password for that web site. If you do not have the password, please call the hospital operator.  09/23/2020, 11:00 AM

## 2020-09-24 DIAGNOSIS — J690 Pneumonitis due to inhalation of food and vomit: Secondary | ICD-10-CM | POA: Diagnosis not present

## 2020-09-24 LAB — CBC WITH DIFFERENTIAL/PLATELET
Abs Immature Granulocytes: 0.62 10*3/uL — ABNORMAL HIGH (ref 0.00–0.07)
Basophils Absolute: 0.2 10*3/uL — ABNORMAL HIGH (ref 0.0–0.1)
Basophils Relative: 0 %
Eosinophils Absolute: 0.1 10*3/uL (ref 0.0–0.5)
Eosinophils Relative: 0 %
HCT: 25.7 % — ABNORMAL LOW (ref 39.0–52.0)
Hemoglobin: 7.6 g/dL — ABNORMAL LOW (ref 13.0–17.0)
Immature Granulocytes: 2 %
Lymphocytes Relative: 3 %
Lymphs Abs: 1.2 10*3/uL (ref 0.7–4.0)
MCH: 21.9 pg — ABNORMAL LOW (ref 26.0–34.0)
MCHC: 29.6 g/dL — ABNORMAL LOW (ref 30.0–36.0)
MCV: 74.1 fL — ABNORMAL LOW (ref 80.0–100.0)
Monocytes Absolute: 0.9 10*3/uL (ref 0.1–1.0)
Monocytes Relative: 2 %
Neutro Abs: 35.1 10*3/uL — ABNORMAL HIGH (ref 1.7–7.7)
Neutrophils Relative %: 93 %
Platelets: 355 10*3/uL (ref 150–400)
RBC: 3.47 MIL/uL — ABNORMAL LOW (ref 4.22–5.81)
RDW: 17.2 % — ABNORMAL HIGH (ref 11.5–15.5)
WBC: 38.2 10*3/uL — ABNORMAL HIGH (ref 4.0–10.5)
nRBC: 0 % (ref 0.0–0.2)

## 2020-09-24 LAB — COMPREHENSIVE METABOLIC PANEL
ALT: 12 U/L (ref 0–44)
AST: 17 U/L (ref 15–41)
Albumin: 2.1 g/dL — ABNORMAL LOW (ref 3.5–5.0)
Alkaline Phosphatase: 100 U/L (ref 38–126)
Anion gap: 10 (ref 5–15)
BUN: 19 mg/dL (ref 8–23)
CO2: 26 mmol/L (ref 22–32)
Calcium: 8.2 mg/dL — ABNORMAL LOW (ref 8.9–10.3)
Chloride: 107 mmol/L (ref 98–111)
Creatinine, Ser: 0.9 mg/dL (ref 0.61–1.24)
GFR, Estimated: >60 mL/min (ref >60)
Glucose, Bld: 93 mg/dL (ref 70–99)
Potassium: 3.2 mmol/L — ABNORMAL LOW (ref 3.5–5.1)
Sodium: 143 mmol/L (ref 135–145)
Total Bilirubin: 0.8 mg/dL (ref 0.3–1.2)
Total Protein: 5.7 g/dL — ABNORMAL LOW (ref 6.5–8.1)

## 2020-09-24 LAB — CULTURE, BLOOD (ROUTINE X 2)
Culture: NO GROWTH
Culture: NO GROWTH
Special Requests: ADEQUATE

## 2020-09-24 NOTE — NC FL2 (Signed)
[place] LEVEL OF CARE SCREENING TOOL     IDENTIFICATION  Patient Name: Matthew Gray Birthdate: [birth year] Sex: male Admission Date (Current Location): 09/19/2020  [facility] and [place] Number:  Herbalist and Address:  [facility],  [place] [address], [place]      Provider Number: [id number]  Attending Physician Name and Address:  Nita Sells, MD  Relative Name and Phone Number:  Mickie Kozikowski [phone]    Current Level of Care: Hospital Recommended Level of Care: [place] Prior Approval Number:    Date Approved/Denied:   PASRR Number:    Discharge Plan: SNF    Current Diagnoses: Patient Active Problem List   Diagnosis Date Noted  . Protein-calorie malnutrition, severe 09/22/2020  . Acute hypoxemic respiratory failure ([place]) 09/19/2020  . Aspiration pneumonia ([place]) 09/19/2020  . Dysphagia 09/12/2020  . Tachycardia 09/12/2020  . Acute renal failure superimposed on chronic kidney disease ([place]) 06/11/2020  . Pressure injury of skin 05/24/2020  . Pneumonia due to COVID-19 virus 05/22/2020  . Encephalopathy due to COVID-19 virus 05/22/2020  . Microhematuria 03/17/2020  . Acute bilateral low back pain without sciatica 03/05/2020  . Constipation 03/05/2020  . Weight loss, abnormal 03/05/2020  . Closed wedge compression fracture of third lumbar vertebra ([place]) 03/05/2020  . Pneumonia of right middle lobe due to infectious organism 03/05/2020  . Abnormal findings on diagnostic imaging of lung 10/06/2019  . Urinary frequency 08/04/2019  . Community acquired pneumonia 11/02/2018  . Pneumonia 11/02/2018  . COPD exacerbation ([place]) 10/14/2018  . Influenza-like illness 10/04/2018  . Urinary incontinence 07/19/2018  . Fatigue 10/22/2017  . Hypokalemia 07/29/2017  . Driving safety issue 04/21/2017  . Acute right ankle pain 08/16/2016  . Bilateral hand pain 03/30/2016  . Bilateral hip pain 03/27/2016  .  Loose stools 03/27/2016  . Low back pain 11/13/2015  . Cough 02/06/2015  . Weakness 07/19/2014  . Right inguinal hernia 03/04/2013  . Dementia, Alzheimer's, with behavior disturbance ([place]) 01/13/2013  . Obstructive chronic bronchitis without exacerbation ([place])   . Polyneuropathy in other diseases classified elsewhere ([place])   . PVD (peripheral vascular disease) ([place]) 05/04/2012  . Peripheral neuropathy 03/08/2012  . Bilateral leg pain 03/08/2012  . Peripheral edema 09/10/2011  . Encounter for well adult exam with abnormal findings 11/22/2010  . Weight loss 11/22/2010  . Anxiety 11/22/2010  . PARESTHESIA 09/02/2010  . Vitamin D deficiency 06/04/2010  . Depression 11/30/2009  . ANKLE PAIN, RIGHT 11/30/2009  . FATIGUE 11/30/2009  . PERIPHERAL EDEMA 11/16/2009  . SCIATICA, LEFT 11/08/2009  . Backache 10/08/2009  . SHOULDER PAIN, LEFT 06/21/2009  . Hemoptysis 04/24/2009  . 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 MYALGIA 02/04/2008  . Pain in Soft Tissues of Limb 02/04/2008  . Coronary atherosclerosis 10/27/2007  . PERIPHERAL VASCULAR DISEASE 10/27/2007  . Allergic rhinitis 10/27/2007  . Asthma 10/27/2007  . COPD (chronic obstructive pulmonary disease) ([place]) 10/27/2007  . BENIGN PROSTATIC HYPERTROPHY 10/27/2007  . RASH-NONVESICULAR 10/27/2007  . COLONIC POLYPS, HX OF 10/27/2007  . Hyperlipidemia 04/03/2007  . Essential hypertension 04/03/2007    Orientation RESPIRATION BLADDER Height & Weight     Self  Normal Indwelling catheter Weight: 49.8 kg Height:  5\' 8"  (172.7 cm)  BEHAVIORAL SYMPTOMS/MOOD NEUROLOGICAL BOWEL NUTRITION STATUS      Incontinent Diet (dysphagia 1)  AMBULATORY STATUS COMMUNICATION OF NEEDS Skin   Limited Assist Verbally (slow, & soft  w/words) Normal  Personal Care Assistance Level of Assistance  Bathing,Feeding,Dressing Bathing Assistance: Limited assistance Feeding assistance: Limited assistance Dressing Assistance:  Limited assistance     Functional Limitations Info  Sight,Hearing,Speech Sight Info: Impaired (eyeglasses) Hearing Info: Adequate Speech Info: Impaired (dysphagia 1 diet;partial uppers)    SPECIAL CARE FACTORS FREQUENCY                       Contractures Contractures Info: Not present    Additional Factors Info  Code Status,Allergies,Psychotropic Code Status Info:  (Full code) Allergies Info:  (Atorvastatin, Simvastatin, Zetia (Ezetimibe), Ace Inhibitors, Penicillins) Psychotropic Info:  (Aricept-dementia)         Current Medications (09/24/2020):  This is the current hospital active medication list Current Facility-Administered Medications  Medication Dose Route Frequency Provider Last Rate Last Admin  . 0.9 %  sodium chloride infusion   Intravenous Continuous Nita Sells, MD 50 mL/hr at 09/24/20 0404 New Bag at 09/24/20 0404  . acetaminophen (TYLENOL) tablet 650 mg  650 mg Oral Q6H PRN Lovey Newcomer T, NP   650 mg at 09/21/20 2341  . amLODipine (NORVASC) tablet 5 mg  5 mg Oral Daily Nita Sells, MD   5 mg at 09/24/20 1008  . aspirin EC tablet 81 mg  81 mg Oral Daily Nita Sells, MD   81 mg at 09/24/20 1009  . ceFEPIme (MAXIPIME) 2 g in sodium chloride 0.9 % 100 mL IVPB  2 g Intravenous Q12H Nita Sells, MD 200 mL/hr at 09/24/20 0406 2 g at 09/24/20 0406  . donepezil (ARICEPT) tablet 10 mg  10 mg Oral QHS Nita Sells, MD   10 mg at 09/23/20 2151  . feeding supplement (ENSURE ENLIVE / ENSURE PLUS) liquid 237 mL  237 mL Oral TID BM Samtani, Jai-Gurmukh, MD   237 mL at 09/24/20 1007  . heparin injection 5,000 Units  5,000 Units Subcutaneous Q8H Nita Sells, MD   5,000 Units at 09/24/20 0641  . ipratropium-albuterol (DUONEB) 0.5-2.5 (3) MG/3ML nebulizer solution 3 mL  3 mL Nebulization Q6H PRN Samtani, Jai-Gurmukh, MD      . metroNIDAZOLE (FLAGYL) IVPB 500 mg  500 mg Intravenous Q8H Samtani, Jai-Gurmukh, MD 100 mL/hr at  09/24/20 1007 500 mg at 09/24/20 1007  . potassium chloride (KLOR-CON) packet 40 mEq  40 mEq Oral Daily Nita Sells, MD   40 mEq at 09/24/20 1007     Discharge Medications: Please see discharge summary for a list of discharge medications.  Relevant Imaging Results:  Relevant Lab Results:   Additional Information SS#[id number] [phone], Juliann Pulse, [place]

## 2020-09-24 NOTE — Care Management (Signed)
Transition of Care (TOC) -30 day Note       Patient Details   Name: Matthew Gray  SHF:[id number]  Date of Birth:04/06/1934     Transition of Care [facility]) CM/SW Contact   Name:Noland Pizano [place] [facility]   Phone Number:[phone]  Date:09/24/20  Time:1:33p     MUST OI:[id number]     To Whom it May Concern:     Please be advised that the above patient will require a short-term nursing home stay, anticipated 30 days or less rehabilitation and strengthening. The plan is for return home.

## 2020-09-24 NOTE — Progress Notes (Signed)
PROGRESS NOTE   Matthew Gray  ZOX:[id number] DOB: 1933-10-26 DOA: 09/19/2020 PCP: Biagio Borg, MD  Brief Narrative:  87 year old black male known history of COPD CAD HLD CKD 2 bladder tumor status post TURBT 08/09/2020-supposed to undergo XRT under Dr. Tammi Klippel soon hyperglycemia without diagnosis of diabetes mellitus home dwelling Goes to daycare Found to have Covid pneumonia treated for the same 05/2020 Seen at PCP office 1/26 found to be weak hypoxic-daughter relates not been eating adequately apparently has been having difficulty eating and airway congestion  Sats and 70% on room air White count 36, hemoglobin 8 lactic acid 1.8 CXR = consolidation  Assessment & Plan:   Principal Problem:   Aspiration pneumonia ([place]) Active Problems:   Hyperlipidemia   Essential hypertension   COPD (chronic obstructive pulmonary disease) ([place])   Acute renal failure superimposed on chronic kidney disease ([place])   Acute hypoxemic respiratory failure ([place])   Protein-calorie malnutrition, severe   1. Sepsis 2/2 likely aspiration pneumonia a. Speech graduated to diet to dysphagia 1 careful monitor allow feeds although some coughing earlier today b. Patient did not receive antibiotics on 1/28 likely accounting for high fevers c. Continues on cefepime and Flagyl IV monitor trends of fever curve d. Continue saline 50 cc/H e. Chest x-ray two-view from 1/30  showed improved infiltrates f. Given high white count have repeated blood culture --- if no improvements will scan chest to ensure no abscess or other causes for high white count as he is also low-grade temperatures despite being on broad-spectrum antibiotics 2. COPD a. Resume DuoNeb every 6 as needed b. Attempt flutter valve use 3. CAD a. Not on ACE/not on beta-blocker? b. Outpatient reeval can continue amlodipine 5 4. AKI superimposed on CKD 2  a. Hydrate with fluids 50 cc/h NS for now b. Stable at this time 5. Bladder tumor status post  TURBT a. Outpatient reevaluation management 6. Microcytic anemia with hemodulition 7. Transfuse if below 7 a. Hold fereheme for right now b. Outpatient 8. Severe malnutrition 9. Hyperglycemia a. CBGs ranging 90s to 100s-stop checking b. BMI 16-hold supplements at this time 10. Adult failure to thrive in the setting of dementia a. Resume Aricept 10, hold seroquel given somnolence during hospital stay   DVT prophylaxis: Lovenox Code Status: Full code Family Communication: Long discussion with daughter Jeannie Done on several days at the bedside Disposition:  Status is: Inpatient at this time as white count is still elevated may obtain CT scan if not improved  Remains inpatient appropriate because:Hemodynamically unstable   Dispo: The patient is from: Home              Anticipated d/c is to: Home daughter insisted on taking patient home              Anticipated d/c date is: > 3 days              Patient currently is not medically stable to d/c.   Difficult to place patient No       Consultants:   None  Procedures: None  Antimicrobials: As above   Subjective:  More alert coherent eating drinking Daughter at bedside Note low-grade temps but no other issues  Objective: Vitals:   09/23/20 1324 09/23/20 2152 09/24/20 0624 09/24/20 0625  BP: 119/84 (!) 138/98 (!) 136/92   Pulse: 93 95 95   Resp: (!) 30 (!) 24 20   Temp: 99.1 F (37.3 C) 99.8 F (37.7 C)  99.2 F (37.3 C)  TempSrc:  Oral Oral  Oral  SpO2: 100% 99% 99%   Weight:      Height:        Intake/Output Summary (Last 24 hours) at 09/24/2020 4097 Last data filed at 09/24/2020 0700 Gross per 24 hour  Intake 1940.54 ml  Output 2100 ml  Net -159.46 ml   Filed Weights   09/19/20 1717 09/20/20 2147  Weight: 49 kg 49.8 kg    Examination:  EOMI NCAT confused but pleasant overall CTA B no added sound no rales no rhonchi Abdomen soft no rebound no guarding No lower extremity edema Neurologically  intact Psych Pleasant coherent x1 but confused otherwise No lower extremity edema  Data Reviewed: I have personally reviewed following labs and imaging studies  k 3.3-->4.1-->3.4-->3.2 Bun /cr 21/1.15-->17/0.9-->21/0.8-->19/0.9 Wbc 35-->33.2-->34.5-->37.4--insulin 38 Hemoglobin 7.6-->8.0--->7.6 COVID-19 Labs  No results for input(s): DDIMER, FERRITIN, LDH, CRP in the last 72 hours.  Lab Results  Component Value Date   SARSCOV2NAA NEGATIVE 09/19/2020   SARSCOV2NAA Detected (A) 06/11/2020   SARSCOV2NAA POSITIVE (A) 05/21/2020     Radiology Studies: DG Chest 2 View  Result Date: 09/23/2020 CLINICAL DATA:  Weakness and fever. EXAM: CHEST - 2 VIEW COMPARISON:  Chest x-rays dated 09/19/2020 and 05/21/2020. FINDINGS: Borderline cardiomegaly, stable. Overall cardiomediastinal silhouette is stable in size and configuration. Improved aeration at the RIGHT lung base. Persistent opacity at the LEFT lung base. Probable small bilateral pleural effusions. No pneumothorax is seen. Osseous structures about the chest are unremarkable. IMPRESSION: 1. Improved aeration at the RIGHT lung base, suggesting resolving atelectasis or pneumonia. 2. Persistent LEFT lower lobe opacity, atelectasis versus pneumonia. 3. Probable small bilateral pleural effusions. 4. Borderline cardiomegaly, stable. Electronically Signed   By: Franki Cabot M.D.   On: 09/23/2020 12:47     Scheduled Meds: Continuous Infusions:   LOS: 5 days    Time spent: [place], MD [facility] To contact the attending provider between 7A-7P or the covering provider during after hours 7P-7A, please log into the web site [email or web address] and access using universal [place] password for that web site. If you do not have the password, please call the hospital operator.  09/24/2020, 9:21 AM

## 2020-09-24 NOTE — Care Management Important Message (Signed)
Important Message  Patient Details IM Letter given to the Patient Name: Matthew Gray MRN: [id number] Date of Birth: September 05, 1933   Medicare Important Message Given:  Yes     Kerin Salen 09/24/2020, 2:05 PM

## 2020-09-24 NOTE — Progress Notes (Signed)
Physical Therapy Treatment Patient Details Name: Matthew Gray MRN: [id number] DOB: [birth year] Today's Date: 09/24/2020    History of Present Illness Pt is 87 year old black male known history of COPD DM CAD HLD CKD 2 bladder tumor status post TURP 08/09/2020.  Pt admitted with sepsis likely secondary to aspiration PNE.  He did have COVID 10/21    PT Comments    Pt assisted to sitting and had coughing episode with increased mucous and phlegm from nares and mouth.  Pt encouraged to cough and get secretions out.  Pt then assisted with transfer to recliner.  Pt requiring mod to max assist for mobility at this time so would recommend SNF upon d/c.   Follow Up Recommendations  SNF     Equipment Recommendations  Rolling walker with 5" wheels;3in1 (PT);Wheelchair cushion (measurements PT);Wheelchair (measurements PT)    Recommendations for Other Services       Precautions / Restrictions Precautions Precautions: Fall    Mobility  Bed Mobility Overal bed mobility: Needs Assistance Bed Mobility: Supine to Sit     Supine to sit: Mod assist     General bed mobility comments: Required multimodal cues and mod A due to posterior lean; pt initiating however requiring assist to complete  Transfers Overall transfer level: Needs assistance Equipment used: Rolling walker (2 wheeled) Transfers: Sit to/from [facility] Sit to Stand: Mod assist;Max assist Stand pivot transfers: Mod assist       General transfer comment: assist to rise and steady, posterior bias throughout stance and transfer to recliner requiring assist  Ambulation/Gait                 Stairs             Wheelchair Mobility    Modified Rankin (Stroke Patients Only)       Balance Overall balance assessment: Needs assistance Sitting-balance support: Bilateral upper extremity supported Sitting balance-Leahy Scale: Poor   Postural control: Posterior lean Standing balance support:  Bilateral upper extremity supported Standing balance-Leahy Scale: Zero Standing balance comment: required at least mod assist for stability                            Cognition Arousal/Alertness: Awake/alert Behavior During Therapy: WFL for tasks assessed/performed Overall Cognitive Status: Impaired/Different from baseline                                 General Comments: remains sleepy but able to follow commands with multimodal cues, cues to keep eyes open      Exercises      General Comments        Pertinent Vitals/Pain Pain Assessment: No/denies pain    Home Living                      Prior Function            PT Goals (current goals can now be found in the care plan section) Progress towards PT goals: Progressing toward goals    Frequency    Min 3X/week      PT Plan Discharge plan needs to be updated    Co-evaluation              AM-PAC PT "6 Clicks" Mobility   Outcome Measure  Help needed turning from your back to your side while in a flat bed without using  bedrails?: A Lot Help needed moving from lying on your back to sitting on the side of a flat bed without using bedrails?: A Lot Help needed moving to and from a bed to a chair (including a wheelchair)?: A Lot Help needed standing up from a chair using your arms (e.g., wheelchair or bedside chair)?: A Lot Help needed to walk in hospital room?: A Lot Help needed climbing 3-5 steps with a railing? : Total 6 Click Score: 11    End of Session Equipment Utilized During Treatment: Gait belt Activity Tolerance: Patient limited by fatigue Patient left: in chair;with call bell/phone within reach;with chair alarm set Nurse Communication: Mobility status PT Visit Diagnosis: Unsteadiness on feet (R26.81);Muscle weakness (generalized) (M62.81)     Time: 1191-4782 PT Time Calculation (min) (ACUTE ONLY): 18 min  Charges:  $Therapeutic Activity: 8-22 mins                      Jannette Spanner PT, DPT Acute Rehabilitation Services Pager: [phone] Office: [phone]  York Ram E 09/24/2020, 3:19 PM

## 2020-09-25 ENCOUNTER — Ambulatory Visit: Payer: Medicare HMO | Admitting: Radiation Oncology

## 2020-09-25 ENCOUNTER — Inpatient Hospital Stay (HOSPITAL_COMMUNITY): Payer: Medicare HMO

## 2020-09-25 ENCOUNTER — Ambulatory Visit: Payer: Medicare HMO

## 2020-09-25 DIAGNOSIS — J690 Pneumonitis due to inhalation of food and vomit: Secondary | ICD-10-CM | POA: Diagnosis not present

## 2020-09-25 LAB — COMPREHENSIVE METABOLIC PANEL
ALT: 12 U/L (ref 0–44)
AST: 19 U/L (ref 15–41)
Albumin: 2 g/dL — ABNORMAL LOW (ref 3.5–5.0)
Alkaline Phosphatase: 91 U/L (ref 38–126)
Anion gap: 10 (ref 5–15)
BUN: 22 mg/dL (ref 8–23)
CO2: 23 mmol/L (ref 22–32)
Calcium: 8.1 mg/dL — ABNORMAL LOW (ref 8.9–10.3)
Chloride: 108 mmol/L (ref 98–111)
Creatinine, Ser: 0.79 mg/dL (ref 0.61–1.24)
GFR, Estimated: >60 mL/min (ref >60)
Glucose, Bld: 92 mg/dL (ref 70–99)
Potassium: 3.3 mmol/L — ABNORMAL LOW (ref 3.5–5.1)
Sodium: 141 mmol/L (ref 135–145)
Total Bilirubin: 0.8 mg/dL (ref 0.3–1.2)
Total Protein: 5.3 g/dL — ABNORMAL LOW (ref 6.5–8.1)

## 2020-09-25 LAB — CBC WITH DIFFERENTIAL/PLATELET
Abs Immature Granulocytes: 0.72 10*3/uL — ABNORMAL HIGH (ref 0.00–0.07)
Basophils Absolute: 0.1 10*3/uL (ref 0.0–0.1)
Basophils Relative: 0 %
Eosinophils Absolute: 0.1 10*3/uL (ref 0.0–0.5)
Eosinophils Relative: 0 %
HCT: 24.4 % — ABNORMAL LOW (ref 39.0–52.0)
Hemoglobin: 7.2 g/dL — ABNORMAL LOW (ref 13.0–17.0)
Immature Granulocytes: 2 %
Lymphocytes Relative: 2 %
Lymphs Abs: 0.9 10*3/uL (ref 0.7–4.0)
MCH: 21.8 pg — ABNORMAL LOW (ref 26.0–34.0)
MCHC: 29.5 g/dL — ABNORMAL LOW (ref 30.0–36.0)
MCV: 73.7 fL — ABNORMAL LOW (ref 80.0–100.0)
Monocytes Absolute: 0.9 10*3/uL (ref 0.1–1.0)
Monocytes Relative: 2 %
Neutro Abs: 35.1 10*3/uL — ABNORMAL HIGH (ref 1.7–7.7)
Neutrophils Relative %: 94 %
Platelets: 340 10*3/uL (ref 150–400)
RBC: 3.31 MIL/uL — ABNORMAL LOW (ref 4.22–5.81)
RDW: 17.2 % — ABNORMAL HIGH (ref 11.5–15.5)
WBC: 37.9 10*3/uL — ABNORMAL HIGH (ref 4.0–10.5)
nRBC: 0 % (ref 0.0–0.2)

## 2020-09-25 MED ORDER — IOHEXOL 300 MG/ML  SOLN
100.0000 mL | Freq: Once | INTRAMUSCULAR | Status: AC | PRN
  Administered 2020-09-25: 100 mL via INTRAVENOUS

## 2020-09-25 MED ORDER — IOHEXOL 9 MG/ML PO SOLN: 500.0000 mL | ORAL | Status: AC

## 2020-09-25 MED ORDER — POTASSIUM CHLORIDE 20 MEQ PO PACK
40.0000 meq | PACK | Freq: Two times a day (BID) | ORAL | Status: DC
  Administered 2020-09-25 – 2020-09-28 (×7): 40 meq via ORAL
  Filled 2020-09-25 (×7): qty 2

## 2020-09-25 MED ORDER — FUROSEMIDE 20 MG PO TABS
20.0000 mg | ORAL_TABLET | Freq: Every day | ORAL | Status: AC
  Administered 2020-09-25 – 2020-09-26 (×2): 20 mg via ORAL
  Filled 2020-09-25 (×2): qty 1

## 2020-09-25 MED ORDER — AMOXICILLIN-POT CLAVULANATE 875-125 MG PO TABS: 1.0000 | ORAL_TABLET | Freq: Two times a day (BID) | ORAL | Status: DC

## 2020-09-25 MED ORDER — CLINDAMYCIN HCL 300 MG PO CAPS
300.0000 mg | ORAL_CAPSULE | Freq: Three times a day (TID) | ORAL | Status: DC
  Administered 2020-09-25 – 2020-09-27 (×5): 300 mg via ORAL
  Filled 2020-09-25 (×5): qty 1

## 2020-09-25 MED ORDER — IOHEXOL 9 MG/ML PO SOLN
ORAL | Status: AC
  Administered 2020-09-25: 500 mL
  Filled 2020-09-25: qty 1000

## 2020-09-25 NOTE — Progress Notes (Signed)
Went to administer morning medications, daughter at bedside and states, "he needs to eat something first".  Informed daughter that would be back with medications.

## 2020-09-25 NOTE — TOC Progression Note (Signed)
Transition of Care [facility]) - Progression Note    Patient Details  Name: Matthew Gray MRN: [id number] Date of Birth: Feb 01, 1934  Transition of Care [facility]) CM/SW Contact  Nohea Kras, Juliann Pulse, RN Phone Number: 09/25/2020, 2:10 PM  Clinical Narrative:dtr [name] declines SNF-wants [facility] able to provide [place];[facility] dme rep Zah to provide dme-deliver to home prior d/c. PTAR @ d/c.       Expected Discharge Plan: [place] Barriers to Discharge: Continued Medical Work up  Expected Discharge Plan and Services Expected Discharge Plan: [place]   Discharge Planning Services: CM Consult Post Acute Care Choice: [place] arrangements for the past 2 months: Single Family Home                 DME Arranged: 3-N-1,Hospital bed,Wheelchair manual,Walker rolling   Date DME Agency Contacted: 09/25/20 Time DME Agency Contacted: [id number] Representative spoke with at DME Agency: [place]: PT,OT,Nurse's Aide,Social Work [place]: [facility] [employer] Date [place]: 09/25/20 Time [place]: [place] Representative spoke with at [place]: [place] ([place]) Interventions    Readmission Risk Interventions Readmission Risk Prevention Plan 09/21/2020  Transportation Screening Complete  PCP or Specialist Appt within 3-5 Days Complete  HRI or [place] Complete  Social Work Consult for [place] Planning/Counseling Complete  Palliative Care Screening Complete  Medication Review Press photographer) Complete  Some recent data might be hidden

## 2020-09-25 NOTE — Progress Notes (Signed)
Pharmacy Antibiotic Note  Matthew Gray is a 87 y.o. male admitted on 09/19/2020 with aspiration pneumonia.  Pharmacy has been consulted for cefepime dosing.  Today, 09/25/20  WBC 37.9, remains elevated  SCr 0.8, CrCl ~48 mL/min. WNL, stable  Afebrile  Today is day #4 of IV antibiotics   Plan:  Continue cefepime 2 g IV q12h  Metronidazole 500 mg IV q8h - F/U renal function, culture results, and clinical course  Height: 5\' 8"  (172.7 cm) Weight: 49.8 kg (109 lb 12.8 oz) IBW/kg (Calculated) : 68.4  Temp (24hrs), Avg:98.6 F (37 C), Min:98.2 F (36.8 C), Max:99.2 F (37.3 C)  Recent Labs  Lab 09/19/20 1739 09/19/20 2233 09/20/20 0822 09/21/20 0608 09/22/20 0558 09/23/20 0654 09/24/20 0516 09/25/20 0539  WBC  --   --    < > 33.2* 34.5* 37.4* 38.2* 37.9*  CREATININE  --   --    < > 0.67 1.11 0.89 0.90 0.79  LATICACIDVEN 1.8 0.9  --   --   --   --   --   --    < > = values in this interval not displayed.    Estimated Creatinine Clearance: 48.4 mL/min (by C-G formula based on SCr of 0.79 mg/dL).    Allergies  Allergen Reactions  . Atorvastatin Other (See Comments)    REACTION: myalgias  . Simvastatin Other (See Comments)    REACTION: myalgias  . Zetia [Ezetimibe] Other (See Comments)    weakness  . Ace Inhibitors Other (See Comments)    cough  . Penicillins Other (See Comments)    Unknown; Tolerated Ceftriaxone on 10/2018    Antimicrobials this admission: 1/26 ceftriaxone x 1 1/26 azithromycin x 1 1/29 cefepime >>  1/29 flagyl >>   Dose adjustments this admission:  Microbiology results: 1/26 BCx: ngF 1/21 repeat BCx: ngtd  Thank you for allowing pharmacy to be a part of this patient's care.  Lenis Noon, PharmD 09/25/2020 7:38 AM

## 2020-09-25 NOTE — Progress Notes (Signed)
Patient and daughter informed of importance of drinking contrast for CT.  Patient and daughter refusing to drink second bottle of contrast at this time.  MD notified, attempted to call CT with no answer, will continue attempt to notify CT.

## 2020-09-25 NOTE — Plan of Care (Signed)

## 2020-09-25 NOTE — Progress Notes (Signed)
Matthew Gray, caregiver that is room with patient, informed of brother calling inquiring about patient.  [name] asked this nurse not to send calls to patient room and that she does not want to talk to brother.  Secretary informed this nurse that brother (unknown name) was upset on phone stating that he and his sister do not get along.  Brother informed Network engineer that he would be contacting administration.

## 2020-09-25 NOTE — Progress Notes (Signed)
SLP Cancellation Note  Patient Details Name: Matthew Gray MRN: [id number] DOB: 09/18/1933   Cancelled treatment:       Reason Eval/Treat Not Completed: Other (comment);Patient at procedure or test/unavailable (pt at CT at this time, will continue efforts)  Kathleen Lime, MS [facility] SLP [place] Office [phone] Pager [phone]    Macario Golds 09/25/2020, 2:54 PM

## 2020-09-25 NOTE — TOC Progression Note (Addendum)
Transition of Care [facility]) - Progression Note    Patient Details  Name: Matthew Gray MRN: [id number] Date of Birth: 1934/04/14  Transition of Care [facility]) CM/SW Contact  Maximus Hoffert, Juliann Pulse, RN Phone Number: 09/25/2020, 12:26 PM  Clinical Narrative: Damaris Schooner to dtr [name] about SNF-she declines SNF wants Home w/HHC-chose [facility]-rep Kenzie-will check if able to accept;DME-[facility] rep Zach chosen-will need rw,w/c,3n1,hospital bed-dtr will talk to [facility] to confirm all dme needed. PTAR @ d/c.    1. 0.9 mi [place] and [place], [place] [address] [phone] Overall rating Much above average 2. 1.3 mi [place] at [place], [place] [address] [phone] Overall rating Much below average 3. 2.3 mi [place], [place] [address] [phone] Overall rating Much below average 4. 2.7 mi [place] [address] [place], [place] [address] [phone] Overall rating Below average 5. 3 mi [facility] at [place], [place] [address] [phone] Overall rating Below average 6. 3.1 [place], [place] [address] [phone] Overall rating Below average 7. 3.4 mi [facility] at the [place], [place] [address] [phone] Overall rating Below average 8. 3.4 [place], [place] [address] [phone] Overall rating Above average 9. [place] [address] [place], [place] [address] [phone] Overall rating Much below average 10. 4.1 mi [facility] at [place], [place] [address] [phone] Overall rating Much above average 11. 4.2  [place], [place] [address] [phone] Overall rating Much above average 12. 4.2 mi [facility] [place], [place] [address] [phone] Overall rating Below average 13. [place], [place] [address] [phone] Overall rating Above average 14. 8.2 [place], [place] [address] [phone] Overall rating Above average 15. 8.4 mi [facility] [address] [place], [place] [address] [phone] Overall rating Average 16. 8.7 mi [facility] [address], [place] [address] [phone] Overall rating Much above average 17. 9.6 mi [facility] and [place], [place] [address] [phone] Overall rating Average 18. 10.3 mi [place] at [facility] [address] [place], [place] [address] [phone] Overall rating Much above average 19. 12.1 mi [place], [place] [address] [phone] Overall rating Much below average 20. 12.2 [facility] [address] [place], [place] [address] [phone] Overall rating Much below average 21. 13.7 mi The [place] [address] [place], [place] [address] [phone] Overall rating Much below average 22. 13.8 mi [facility] at [place], [place] [address] [phone] Overall rating Below average 23. 14.4 [place],  [address] [phone] Overall rating Below average 24. 14.5 [place] [address] [place],  [address] [phone] Overall rating Much below  average 25. 16.5 mi [place] [address] [place],  [address] [phone] Overall  rating Above average 26. 17.2 mi [facility] [place], [place] [address] [phone] Overall rating Much above average 27. 97.9 [facility] [address] [place], [place] [address] [phone] Overall rating Much below average 28. 18.4 mi [place], [place] [address] [phone] Overall rating Average 29. 19.2 mi [facility] and [facility] [address] [place], [place] [address] [phone] Overall rating Much below average 30. 20.5 mi [address] at the [facility], [place] [address] [phone] Overall rating Much above average 31. 21.2 mi [address] [place], [place] [address] [phone] Overall rating Below average 32. 21.2 [place], [place] [address] [phone] Overall rating Average 33. 21.4 [address] [place], [place] [address] [phone] Overall rating Much above average 34. [place] [address] [place], [place] [address] [phone] Overall rating Below average 35. 22.2 [place], [place] [address] [phone] Overall rating Much above average 36. 22.4 mi [facility] [place], [place] [address] [phone] Overall rating Much below average 37. 23.2 mi [facility] [address] [place], [place] [address] [phone] Overall rating Below average 38. 23.8 mi [facility] [address] [place], [place] [address] [phone] Overall rating Above average 39. 23.9 [facility] [place], [place] [address] [phone] Overall rating Much below average 40. [place], [place] [address] [phone] Overall rating Much below average 41. [place] [address] [place], [place] [address] [phone] Overall rating Average 42. 24.3 [facility] Care/[place] [address] [place], [place] [address] [phone] Overall rating Much below average 43. 24.3 mi [facility] [employer], [place] [address] [phone] Overall rating Below average 44. 24.8 mi [place] [address] [place], [place] [address] [phone] Overall rating Above average 45. 24.9 mi The [place] at [place], [place] [address] [phone] Overall rating Much below average    Expected Discharge Plan: [place] Barriers to Discharge: Continued Medical Work up  Expected Discharge Plan and Services Expected Discharge Plan: [place]   Discharge Planning Services: CM Consult Post Acute Care Choice: [place] arrangements for the past 2 months: Single Family Home                                       Social Determinants of Health (SDOH) Interventions    Readmission Risk Interventions Readmission Risk Prevention Plan 09/21/2020  Transportation Screening Complete  PCP or Specialist Appt within 3-5 Days Complete  HRI or [place] Complete  Social Work Consult for [place] Planning/Counseling Complete  Palliative Care Screening Complete  Medication Review Press photographer) Complete  Some recent data might be hidden

## 2020-09-25 NOTE — Progress Notes (Signed)
PROGRESS NOTE   Matthew Gray  UXL:[id number] DOB: Dec 29, 1933 DOA: 09/19/2020 PCP: Biagio Borg, MD  Brief Narrative:  87 year old black male known history of COPD CAD HLD CKD 2 bladder tumor status post TURBT 08/09/2020-supposed to undergo XRT under Dr. Tammi Klippel soon hyperglycemia without diagnosis of diabetes mellitus home dwelling Goes to daycare Found to have Covid pneumonia treated for the same 05/2020 Seen at PCP office 1/26 found to be weak hypoxic-daughter relates not been eating adequately apparently has been having difficulty eating and airway congestion  Sats and 70% on room air White count 36, hemoglobin 8 lactic acid 1.8 CXR = consolidation  Assessment & Plan:   Principal Problem:   Aspiration pneumonia ([place]) Active Problems:   Hyperlipidemia   Essential hypertension   COPD (chronic obstructive pulmonary disease) ([place])   Acute renal failure superimposed on chronic kidney disease ([place])   Acute hypoxemic respiratory failure ([place])   Protein-calorie malnutrition, severe   1. Sepsis 2/2 likely aspiration pneumonia a. Speech graduated to diet to dysphagia 1 careful monitor b. Patient did not receive antibiotics on 1/28 likely accounting for high fevers c. Continues on cefepime and Flagyl IV monitor trends of fever curve d. Continue saline 50 cc/H e. Chest x-ray two-view from 1/30  showed improved infiltrates f. Blood culture repeated given low-grade temperature g. CT scan chest 2/1 shows having aspiration and known malignancy with lytic lesions continued areas in the trachea 2. COPD a. Resume DuoNeb every 6 as needed b. Attempt flutter valve use 3. CAD a. Not on ACE/not on beta-blocker? b. Outpatient reeval can continue amlodipine 5 4. AKI superimposed on CKD 2  a. DC saline and give Lasix x1 given mild ascites on CT scan 2/1 b. Stable at this time 5. Bladder tumor status post TURBT a. Outpatient reevaluation management repeat CT scan shows b. Underlying malignancy  with metastatic spread 6. Microcytic anemia with hemodulition 7. Transfuse if below 7 a. Hold fereheme for right now b. Outpatient 8. Severe malnutrition 9. Hyperglycemia a. CBGs ranging 90s to 100s-stop checking b. BMI 16-hold supplements at this time 10. Adult failure to thrive in the setting of dementia a. Resume Aricept 10, hold seroquel given somnolence during hospital stay   DVT prophylaxis: Lovenox Code Status: Full code Family Communication: Long discussion with daughter Jeannie Done on several days at the bedside Disposition:  Status is: Inpatient at this time changing antibiotics to p.o. likely can discharge home but high risk for readmission would recommend palliative care follow-up in the outpatient setting Home with home health in several days as family requesting discharge home  Remains inpatient appropriate because:Hemodynamically unstable   Dispo: The patient is from: Home              Anticipated d/c is to: Home daughter insisted on taking patient home              Anticipated d/c date is: > 3 days              Patient currently is not medically stable to d/c.   Difficult to place patient No    Consultants:   None  Procedures: None  Antimicrobials: A    Subjective:  More alert coherent eating drinking He has mobilized somewhat out of the bed to the chair without issue He continues to improve mentally although he has severe dementia and is pleasant at baseline  Objective: Vitals:   09/24/20 1336 09/24/20 2048 09/25/20 0425 09/25/20 1237  BP: 123/87 126/89 120/81 117/74  Pulse: 90 93 87 90  Resp: 20 18 18 18   Temp: 98.5 F (36.9 C) 98.2 F (36.8 C) 99.2 F (37.3 C) 99.2 F (37.3 C)  TempSrc: Oral Oral  Oral  SpO2: (!) 78% 100% 100% 99%  Weight:      Height:        Intake/Output Summary (Last 24 hours) at 09/25/2020 1639 Last data filed at 09/25/2020 1542 Gross per 24 hour  Intake 1131.84 ml  Output 575 ml  Net 556.84 ml   Filed Weights    09/19/20 1717 09/20/20 2147  Weight: 49 kg 49.8 kg    Examination:  EOMI NCAT confused but pleasant overall CTA B no added sound  Abdomen s slightly distended no guarding No lower extremity edema  Pleasant coherent x1 but confused otherwise No lower extremity edema moving all 4 limbs equally   Data Reviewed: I have personally reviewed following labs and imaging studies  k 3.3-->4.1-->3.4-->3.2--3.3 Bun /cr 21/1.15-->17/0.9-->21/0.8-->19/0.9--22/0.7 Wbc 35-->33.2-->34.5-->37.4----38--37.9 Hemoglobin 7.6-->8.0--->7.6--7.2 COVID-19 Labs  No results for input(s): DDIMER, FERRITIN, LDH, CRP in the last 72 hours.  Lab Results  Component Value Date   SARSCOV2NAA NEGATIVE 09/19/2020   SARSCOV2NAA Detected (A) 06/11/2020   SARSCOV2NAA POSITIVE (A) 05/21/2020     Radiology Studies: CT CHEST W CONTRAST  Result Date: 09/25/2020 CLINICAL DATA:  Abdominal pain, acute, nonlocalized; Pneumonia, unresolved Weakness, hypoxia, difficulty eating. Patient with history of COPD, chronic kidney disease, and bladder tumor status post recent TURBT. EXAM: CT CHEST, ABDOMEN, AND PELVIS WITH CONTRAST TECHNIQUE: Multidetector CT imaging of the chest, abdomen and pelvis was performed following the standard protocol during bolus administration of intravenous contrast. CONTRAST:  19mL OMNIPAQUE IOHEXOL 300 MG/ML  SOLN COMPARISON:  Chest radiograph 2 days ago. Noncontrast abdominal CT 04/28/2020 FINDINGS: CT CHEST FINDINGS Cardiovascular: Aortic atherosclerosis. No dissection or acute aortic findings. No central pulmonary embolus to the lobar level on this non dedicated chest CTA. Upper normal heart size. There are scattered coronary artery calcifications. No pericardial effusion. Mediastinum/Nodes: Scattered stomal mediastinal lymph nodes not enlarged by size criteria. No hilar adenopathy. Decompressed esophagus without wall thickening. No visualized thyroid nodule. Lungs/Pleura: Moderate bilateral pleural  effusions, left greater than right. Associated compressive atelectasis. Small amount of fluid in the left inter lobar fissure with adjacent atelectasis in the upper lobe and lingula. Retained mucus/debris throughout the trachea extending into the right mainstem bronchus. There is bilateral lower lobe bronchial thickening. Punctate parenchymal calcifications in the lingula and both lower lobes. Patchy ground-glass opacity in the non dependent right lower lobe distal to bronchial thickening. Tree in bud opacities in the dependent right upper lobe, as well as the right lung apex. Few clustered tiny nodules at the left lung apex, for example series 8 images 41 and 44. No dominant pulmonary mass. Musculoskeletal: Sclerosis involving the lateral left eleventh rib likely represents remote fracture. Punctate sclerotic density in the left anterior sixth and seventh ribs, nonspecific. No bony destruction or acute osseous abnormality in the thorax. There is generalized edema of subcutaneous chest wall soft tissues. CT ABDOMEN PELVIS FINDINGS Hepatobiliary: No focal hepatic lesion. Unremarkable gallbladder. There is no biliary dilatation. Pancreas: Parenchymal atrophy. Portions of the pancreas are obscured by dense contrast in the colon from prior barium swallow. Occasional scattered pancreatic head calcifications. No evidence of pancreatic mass. No ductal dilatation. Spleen: Normal in size without focal abnormality. Adrenals/Urinary Tract: No adrenal nodule. There is moderate right hydroureteronephrosis. Ureter is likely dilated to the bladder insertion, however not well-defined in the distal  portion due to streak artifact from high-density contrast in the colon. Mild left hydronephrosis. Left ureter is difficult to delineate. Multiple bilateral renal cysts, including a dominant cyst arising from the lower right kidney measuring up to 10 cm cranial caudal. No evidence of solid renal lesion. Calcifications at the renal hila are  favored to represent vascular calcifications rather than stones. The bladder is diffusely irregular, with 3 fluid density structures and irregular soft tissue in the pelvis. The urinary bladder is presumably the more posterior aspect of these fluid density structures in demonstrates diffuse irregular wall thickening. Adjacent thick-walled fluid density structure measuring 6.1 x 5 cm in the left pelvis is no definite communication to the presumed urinary bladder, series 3, image 98. This potential could represent a bladder diverticulum with irregular borders though no definite bladder communication is seen. Alternatively, a necrotic lymph node is considered. More inferiorly there is a 2.9 x 4.0 cm fluid density structure with irregular borders, series 3, image 107. This is suspicious for a necrotic adenopathy. The possibility of postoperative fluid collection is also considered. There is no definite bladder communication. Stomach/Bowel: Please note that high-density material throughout the colon from prior barium swallow obscures bowel in regional evaluation. No evidence of obstruction. The stomach is decompressed and not well assessed. There is no obvious bowel wall thickening or evidence of bowel inflammation. Overall bowel assessment is limited. Moderate colonic stool burden mixed with barium. Diverticula in prior exam are obscured by streak artifact.Possible rectocele. Vascular/Lymphatic: Aortic and branch atherosclerosis. Infrarenal aortic aneurysm measures 3.1 cm, series 3, image 78. Left common iliac artery aneurysm measures 3 cm, series 3, image 86. Patent portal vein. The 2 perivesicular thick-walled collections as described above may represent necrotic pelvic adenopathy. There is also a presumed necrotic node in the right low pelvis measuring 15 mm, series 3, image 107. Paucity of body fat limits detailed assessment for adenopathy. No obvious enlarged retroperitoneal or upper abdominal lymph nodes.  Reproductive: Prostate gland present with central calcifications. Other: Diffuse edema of the subcutaneous and intra-abdominal fat suggestive of third-spacing. Small amount of upper abdominal ascites. Musculoskeletal: Lytic lesion of the left superior ramus with cortical destruction and extraosseous extension consistent with metastatic disease, series 3, image 103. Chronic compression fracture of L2 and L3, unchanged from prior. Heterogeneous appearance of the sacrum which is nonspecific. IMPRESSION: 1. Moderate bilateral pleural effusions, left greater than right, with adjacent compressive atelectasis. 2. Retained mucus/debris throughout the trachea extending into the right mainstem bronchus. Lower lobe bronchial thickening. Patchy areas of tree in bud opacities. Overall findings suggestive of aspiration. Recommend correlation with recent barium swallow. Other infectious or inflammatory etiologies are considered. 3. Additional ground-glass opacity in the right lower lobe may represent pneumonia. 4. Mild left and moderate right hydronephrosis. 5. Abnormal appearance of the urinary bladder in this patient with known bladder malignancy. Bladder is diffusely thick walled and irregular. Two adjacent fluid density structures in the left pelvis are favored to represent adjacent necrotic adenopathy, the possibility of bladder diverticulum are raised. This is felt less likely as no definite communication to the urinary bladder is seen. Inferiorly there is a more typical necrotic node suspicious for metastatic disease. 6. Lytic lesion of the left superior ramus with cortical destruction and extraosseous extension, consistent with metastatic disease. 7. Punctate sclerotic foci in the left anterior sixth and seventh ribs as well as heterogeneous appearance of the sacrum, nonspecific. Consider bone scan for osseous metastatic evaluation. L2 and L3 vertebral bodies are unchanged from  prior exam. 8. Diffuse subcutaneous edema of  the chest and abdominal wall and intra-abdominal fat. Small volume ascites. Findings suggestive of third spacing. 9. Infrarenal aortic and left common iliac artery aneurysms measuring 3.1 and 3 cm respectively. Recommend follow-up every 3 years. This recommendation follows ACR consensus guidelines: White Paper of the ACR Incidental Findings Committee II on Vascular Findings. J Am Coll Radiol 2013; 10:789-794. Aortic Atherosclerosis (ICD10-I70.0). Electronically Signed   By: Keith Rake M.D.   On: 09/25/2020 15:30   CT ABDOMEN PELVIS W CONTRAST  Result Date: 09/25/2020 CLINICAL DATA:  Abdominal pain, acute, nonlocalized; Pneumonia, unresolved Weakness, hypoxia, difficulty eating. Patient with history of COPD, chronic kidney disease, and bladder tumor status post recent TURBT. EXAM: CT CHEST, ABDOMEN, AND PELVIS WITH CONTRAST TECHNIQUE: Multidetector CT imaging of the chest, abdomen and pelvis was performed following the standard protocol during bolus administration of intravenous contrast. CONTRAST:  180mL OMNIPAQUE IOHEXOL 300 MG/ML  SOLN COMPARISON:  Chest radiograph 2 days ago. Noncontrast abdominal CT 04/28/2020 FINDINGS: CT CHEST FINDINGS Cardiovascular: Aortic atherosclerosis. No dissection or acute aortic findings. No central pulmonary embolus to the lobar level on this non dedicated chest CTA. Upper normal heart size. There are scattered coronary artery calcifications. No pericardial effusion. Mediastinum/Nodes: Scattered stomal mediastinal lymph nodes not enlarged by size criteria. No hilar adenopathy. Decompressed esophagus without wall thickening. No visualized thyroid nodule. Lungs/Pleura: Moderate bilateral pleural effusions, left greater than right. Associated compressive atelectasis. Small amount of fluid in the left inter lobar fissure with adjacent atelectasis in the upper lobe and lingula. Retained mucus/debris throughout the trachea extending into the right mainstem bronchus. There is  bilateral lower lobe bronchial thickening. Punctate parenchymal calcifications in the lingula and both lower lobes. Patchy ground-glass opacity in the non dependent right lower lobe distal to bronchial thickening. Tree in bud opacities in the dependent right upper lobe, as well as the right lung apex. Few clustered tiny nodules at the left lung apex, for example series 8 images 41 and 44. No dominant pulmonary mass. Musculoskeletal: Sclerosis involving the lateral left eleventh rib likely represents remote fracture. Punctate sclerotic density in the left anterior sixth and seventh ribs, nonspecific. No bony destruction or acute osseous abnormality in the thorax. There is generalized edema of subcutaneous chest wall soft tissues. CT ABDOMEN PELVIS FINDINGS Hepatobiliary: No focal hepatic lesion. Unremarkable gallbladder. There is no biliary dilatation. Pancreas: Parenchymal atrophy. Portions of the pancreas are obscured by dense contrast in the colon from prior barium swallow. Occasional scattered pancreatic head calcifications. No evidence of pancreatic mass. No ductal dilatation. Spleen: Normal in size without focal abnormality. Adrenals/Urinary Tract: No adrenal nodule. There is moderate right hydroureteronephrosis. Ureter is likely dilated to the bladder insertion, however not well-defined in the distal portion due to streak artifact from high-density contrast in the colon. Mild left hydronephrosis. Left ureter is difficult to delineate. Multiple bilateral renal cysts, including a dominant cyst arising from the lower right kidney measuring up to 10 cm cranial caudal. No evidence of solid renal lesion. Calcifications at the renal hila are favored to represent vascular calcifications rather than stones. The bladder is diffusely irregular, with 3 fluid density structures and irregular soft tissue in the pelvis. The urinary bladder is presumably the more posterior aspect of these fluid density structures in  demonstrates diffuse irregular wall thickening. Adjacent thick-walled fluid density structure measuring 6.1 x 5 cm in the left pelvis is no definite communication to the presumed urinary bladder, series 3, image 98. This potential  could represent a bladder diverticulum with irregular borders though no definite bladder communication is seen. Alternatively, a necrotic lymph node is considered. More inferiorly there is a 2.9 x 4.0 cm fluid density structure with irregular borders, series 3, image 107. This is suspicious for a necrotic adenopathy. The possibility of postoperative fluid collection is also considered. There is no definite bladder communication. Stomach/Bowel: Please note that high-density material throughout the colon from prior barium swallow obscures bowel in regional evaluation. No evidence of obstruction. The stomach is decompressed and not well assessed. There is no obvious bowel wall thickening or evidence of bowel inflammation. Overall bowel assessment is limited. Moderate colonic stool burden mixed with barium. Diverticula in prior exam are obscured by streak artifact.Possible rectocele. Vascular/Lymphatic: Aortic and branch atherosclerosis. Infrarenal aortic aneurysm measures 3.1 cm, series 3, image 78. Left common iliac artery aneurysm measures 3 cm, series 3, image 86. Patent portal vein. The 2 perivesicular thick-walled collections as described above may represent necrotic pelvic adenopathy. There is also a presumed necrotic node in the right low pelvis measuring 15 mm, series 3, image 107. Paucity of body fat limits detailed assessment for adenopathy. No obvious enlarged retroperitoneal or upper abdominal lymph nodes. Reproductive: Prostate gland present with central calcifications. Other: Diffuse edema of the subcutaneous and intra-abdominal fat suggestive of third-spacing. Small amount of upper abdominal ascites. Musculoskeletal: Lytic lesion of the left superior ramus with cortical  destruction and extraosseous extension consistent with metastatic disease, series 3, image 103. Chronic compression fracture of L2 and L3, unchanged from prior. Heterogeneous appearance of the sacrum which is nonspecific. IMPRESSION: 1. Moderate bilateral pleural effusions, left greater than right, with adjacent compressive atelectasis. 2. Retained mucus/debris throughout the trachea extending into the right mainstem bronchus. Lower lobe bronchial thickening. Patchy areas of tree in bud opacities. Overall findings suggestive of aspiration. Recommend correlation with recent barium swallow. Other infectious or inflammatory etiologies are considered. 3. Additional ground-glass opacity in the right lower lobe may represent pneumonia. 4. Mild left and moderate right hydronephrosis. 5. Abnormal appearance of the urinary bladder in this patient with known bladder malignancy. Bladder is diffusely thick walled and irregular. Two adjacent fluid density structures in the left pelvis are favored to represent adjacent necrotic adenopathy, the possibility of bladder diverticulum are raised. This is felt less likely as no definite communication to the urinary bladder is seen. Inferiorly there is a more typical necrotic node suspicious for metastatic disease. 6. Lytic lesion of the left superior ramus with cortical destruction and extraosseous extension, consistent with metastatic disease. 7. Punctate sclerotic foci in the left anterior sixth and seventh ribs as well as heterogeneous appearance of the sacrum, nonspecific. Consider bone scan for osseous metastatic evaluation. L2 and L3 vertebral bodies are unchanged from prior exam. 8. Diffuse subcutaneous edema of the chest and abdominal wall and intra-abdominal fat. Small volume ascites. Findings suggestive of third spacing. 9. Infrarenal aortic and left common iliac artery aneurysms measuring 3.1 and 3 cm respectively. Recommend follow-up every 3 years. This recommendation  follows ACR consensus guidelines: White Paper of the ACR Incidental Findings Committee II on Vascular Findings. J Am Coll Radiol 2013; 10:789-794. Aortic Atherosclerosis (ICD10-I70.0). Electronically Signed   By: Keith Rake M.D.   On: 09/25/2020 15:30     Scheduled Meds: Continuous Infusions:   LOS: 6 days    Time spent: 25  Nita Sells, MD [facility] To contact the attending provider between 7A-7P or the covering provider during after hours 7P-7A, please log into  the web site [email or web address] and access using universal [place] password for that web site. If you do not have the password, please call the hospital operator.  09/25/2020, 4:39 PM

## 2020-09-26 DIAGNOSIS — J9601 Acute respiratory failure with hypoxia: Secondary | ICD-10-CM | POA: Diagnosis not present

## 2020-09-26 DIAGNOSIS — R1310 Dysphagia, unspecified: Secondary | ICD-10-CM

## 2020-09-26 DIAGNOSIS — I719 Aortic aneurysm of unspecified site, without rupture: Secondary | ICD-10-CM

## 2020-09-26 DIAGNOSIS — J690 Pneumonitis due to inhalation of food and vomit: Secondary | ICD-10-CM | POA: Diagnosis not present

## 2020-09-26 DIAGNOSIS — I700 Atherosclerosis of aorta: Secondary | ICD-10-CM | POA: Diagnosis not present

## 2020-09-26 DIAGNOSIS — D509 Iron deficiency anemia, unspecified: Secondary | ICD-10-CM

## 2020-09-26 DIAGNOSIS — A419 Sepsis, unspecified organism: Secondary | ICD-10-CM | POA: Diagnosis not present

## 2020-09-26 DIAGNOSIS — R627 Adult failure to thrive: Secondary | ICD-10-CM

## 2020-09-26 DIAGNOSIS — E43 Unspecified severe protein-calorie malnutrition: Secondary | ICD-10-CM

## 2020-09-26 LAB — CBC WITH DIFFERENTIAL/PLATELET
Abs Immature Granulocytes: 0.78 10*3/uL — ABNORMAL HIGH (ref 0.00–0.07)
Basophils Absolute: 0.1 10*3/uL (ref 0.0–0.1)
Basophils Relative: 0 %
Eosinophils Absolute: 0.1 10*3/uL (ref 0.0–0.5)
Eosinophils Relative: 0 %
HCT: 25.1 % — ABNORMAL LOW (ref 39.0–52.0)
Hemoglobin: 7.6 g/dL — ABNORMAL LOW (ref 13.0–17.0)
Immature Granulocytes: 2 %
Lymphocytes Relative: 3 %
Lymphs Abs: 1.1 10*3/uL (ref 0.7–4.0)
MCH: 22.2 pg — ABNORMAL LOW (ref 26.0–34.0)
MCHC: 30.3 g/dL (ref 30.0–36.0)
MCV: 73.4 fL — ABNORMAL LOW (ref 80.0–100.0)
Monocytes Absolute: 1 10*3/uL (ref 0.1–1.0)
Monocytes Relative: 2 %
Neutro Abs: 40.7 10*3/uL — ABNORMAL HIGH (ref 1.7–7.7)
Neutrophils Relative %: 93 %
Platelets: 345 10*3/uL (ref 150–400)
RBC: 3.42 MIL/uL — ABNORMAL LOW (ref 4.22–5.81)
RDW: 17.2 % — ABNORMAL HIGH (ref 11.5–15.5)
WBC: 43.8 10*3/uL — ABNORMAL HIGH (ref 4.0–10.5)
nRBC: 0 % (ref 0.0–0.2)

## 2020-09-26 LAB — COMPREHENSIVE METABOLIC PANEL
ALT: 13 U/L (ref 0–44)
AST: 19 U/L (ref 15–41)
Albumin: 2 g/dL — ABNORMAL LOW (ref 3.5–5.0)
Alkaline Phosphatase: 97 U/L (ref 38–126)
Anion gap: 12 (ref 5–15)
BUN: 21 mg/dL (ref 8–23)
CO2: 23 mmol/L (ref 22–32)
Calcium: 8.3 mg/dL — ABNORMAL LOW (ref 8.9–10.3)
Chloride: 107 mmol/L (ref 98–111)
Creatinine, Ser: 0.83 mg/dL (ref 0.61–1.24)
GFR, Estimated: >60 mL/min (ref >60)
Glucose, Bld: 86 mg/dL (ref 70–99)
Potassium: 3.6 mmol/L (ref 3.5–5.1)
Sodium: 142 mmol/L (ref 135–145)
Total Bilirubin: 0.5 mg/dL (ref 0.3–1.2)
Total Protein: 5.4 g/dL — ABNORMAL LOW (ref 6.5–8.1)

## 2020-09-26 NOTE — Progress Notes (Signed)
PROGRESS NOTE  Matthew Gray CZY:[id number] DOB: [birth year] DOA: 09/19/2020 PCP: Biagio Borg, MD  Brief History   55yom PMH dementia, bladder cancer, COPD, recurrent pneumonia, presented with generalized weakness, cough, hypoxia, failure to thrive.  Admitted for recurrent aspiration pneumonia with acute hypoxia.  A & P  Sepsis secondary to aspiration pneumonia secondary to dysphagia, with associated acute hypoxic respiratory failure.   --Sepsis resolved.  Pneumonia improving.  Continue antibiotic. --Monitor bilateral pleural effusions asymptomatic  Dysphagia, solid --Resume diet as per speech therapy  COPD --Appears stable.  CKD stage II --Acute kidney injury ruled out  Microcytic anemia --Stable.  Follow-up as an outpatient.  Severe malnutrition --Continue Ensure  High-grade papillary urothelial carcinoma, status post TURBT, known malignancy with metastatic spread --Follow-up with radiation oncology and oncology as an outpatient.  Consider bone scan for osseous metastatic evaluation.  Adult failure to thrive in the setting of advanced dementia dementia, goes to daycare --Stable.  Outpatient PMD recommended.  Continue Aricept.  Infrarenal aortic and left common iliac artery aneurysms measuring 3.1 and 3 cm respectively.  --Recommend follow-up every 3 years.  Aortic atherosclerosis --No inpatient treatment warranted   Nutritional Assessment: Body mass index is 16.7 kg/m.Marland Kitchen Seen by dietician.  I agree with the assessment and plan as outlined below: Nutrition Status: Nutrition Problem: Severe Malnutrition Etiology: cancer and cancer related treatments,chronic illness Signs/Symptoms: estimated needs,percent weight loss (9% weight loss in 3 months.) Percent weight loss: 9 % Interventions: Ensure Enlive (each supplement provides 350kcal and 20 grams of protein),Refer to RD note for recommendations  Disposition Plan:  Discussion: Seems to be improving, can likely  discharge in the next 48 hours  Status is: Inpatient  Remains inpatient appropriate because:Inpatient level of care appropriate due to severity of illness   Dispo: The patient is from: Home              Anticipated d/c is to: Home              Anticipated d/c date is: 2 days              Patient currently is not medically stable to d/c.   Difficult to place patient No  DVT prophylaxis: heparin injection 5,000 Units Start: 09/20/20 0930   Code Status: Prior Level of care: Med-Surg Family Communication: none  Murray Hodgkins, MD  [facility] Direct contact: see [email or web address] (further directions at bottom of note if needed) 7PM-7AM contact night coverage as at bottom of note 09/26/2020, 6:31 PM  LOS: 7 days   Significant Hospital Events   .    Consults:  .    Procedures:  .   Significant Diagnostic Tests:  Marland Kitchen    Micro Data:  .    Antimicrobials:  .   Interval History/Subjective  CC: f/u pneumonia  No complaints, but confused  Objective   Vitals:  Vitals:   09/26/20 0620 09/26/20 1335  BP: 122/85 123/83  Pulse: 91   Resp: (!) 22 (!) 22  Temp: 98.3 F (36.8 C) 98.1 F (36.7 C)  SpO2: 100% 99%    Exam:  Constitutional:   . Appears calm and comfortable ENMT:  . grossly normal hearing  Respiratory:  . CTA bilaterally, no w/r/r.  . Respiratory effort normal.  Cardiovascular:  . RRR, no m/r/g . No LE extremity edema   . Telemetry SR Abdomen:  . Abdomen appears normal; no tenderness Psychiatric:  . Mental status o Mood, affect appropriate  I have personally reviewed the following:  Today's Data  . CMP stable . Hgb stable 7.6  Scheduled Meds: . amLODipine  5 mg Oral Daily  . aspirin EC  81 mg Oral Daily  . clindamycin  300 mg Oral Q8H  . donepezil  10 mg Oral QHS  . feeding supplement  237 mL Oral TID BM  . heparin  5,000 Units Subcutaneous Q8H  . potassium chloride  40 mEq Oral BID   Continuous Infusions:  Principal Problem:    Sepsis ([place]) Active Problems:   Essential hypertension   COPD (chronic obstructive pulmonary disease) (HCC)   Acute renal failure superimposed on chronic kidney disease (HCC)   Dysphagia   Acute hypoxemic respiratory failure (HCC)   Aspiration pneumonia (HCC)   Protein-calorie malnutrition, severe   Microcytic anemia   FTT (failure to thrive) in adult   Aortic aneurysm ([place])   Aortic atherosclerosis ([place])   LOS: 7 days   How to contact the [facility] Attending or Consulting provider 7A - 7P or covering provider during after hours 7P -7A, for this patient?  1. Check the care team in [facility] and look for a) attending/consulting TRH provider listed and b) the [facility] team listed 2. Log into [email or web address] and use [place]'s universal password to access. If you do not have the password, please contact the hospital operator. 3. Locate the [facility] provider you are looking for under [facility] and page to a number that you can be directly reached. 4. If you still have difficulty reaching the provider, please page the [facility] (Director on Call) for the Hospitalists listed on amion for assistance.

## 2020-09-26 NOTE — Hospital Course (Addendum)
36yom PMH dementia, bladder cancer, COPD, recurrent pneumonia, presented with generalized weakness, cough, hypoxia, failure to thrive.  Admitted for recurrent aspiration pneumonia with acute hypoxia.  Critically improved with clinical resolution of pneumonia.  However noted to have significant dysphagia and given likelihood of recurrent aspiration given clinical decline with dementia as well as metastatic bladder cancer, discussed in detail with daughter, elected DNR status, hospice.  Anticipate home 1/5.  A & P  Sepsis secondary to aspiration pneumonia secondary to dysphagia, with associated acute hypoxic respiratory failure.   --Sepsis resolved.  Pneumonia appears resolved.  Completed antibiotics.  Pleural effusions asymptomatic, no further treatment. --WBC significantly elevated, probably reflects recurrent aspiration, no treatment warranted at this point given goals of care.  Dysphagia, solid --Continue diet as per speech therapy  COPD --Remains stable stable.  CKD stage II --Acute kidney injury ruled out  Microcytic anemia --Stable.  No further follow-up.  Severe malnutrition --Continue Ensure  High-grade papillary urothelial carcinoma, status post TURBT, known malignancy with suspected metastatic spread to bone --Discussed in detail with Dr. Louis Meckel who recommended hospice, Dr. Tammi Klippel who reported radiation was planned to the bladder to prevent bleeding, however hospice is quite reasonable.  Also discussed with oncology informally, recommended hospice  Adult failure to thrive in the setting of advanced dementia dementia, goes to daycare --Stable.    Infrarenal aortic and left common iliac artery aneurysms measuring 3.1 and 3 cm respectively.  --No follow-up indicated  Aortic atherosclerosis --No treatment indicated

## 2020-09-26 NOTE — NC FL2 (Signed)
[place] LEVEL OF CARE SCREENING TOOL     IDENTIFICATION  Patient Name: Matthew Gray Birthdate: [birth year] Sex: male Admission Date (Current Location): 09/19/2020  [facility] and [place] Number:  Herbalist and Address:  [facility],  [place], [place]      Provider Number: [id number]  Attending Physician Name and Address:  Samuella Cota, MD  Relative Name and Phone Number:  Jaxn Chiquito [phone]    Current Level of Care: Hospital Recommended Level of Care: [place] Prior Approval Number:    Date Approved/Denied:   PASRR Number: [id number]  Discharge Plan: SNF    Current Diagnoses: Patient Active Problem List   Diagnosis Date Noted  . Protein-calorie malnutrition, severe 09/22/2020  . Acute hypoxemic respiratory failure ([place]) 09/19/2020  . Aspiration pneumonia ([place]) 09/19/2020  . Dysphagia 09/12/2020  . Tachycardia 09/12/2020  . Acute renal failure superimposed on chronic kidney disease ([place]) 06/11/2020  . Pressure injury of skin 05/24/2020  . Pneumonia due to COVID-19 virus 05/22/2020  . Encephalopathy due to COVID-19 virus 05/22/2020  . Microhematuria 03/17/2020  . Acute bilateral low back pain without sciatica 03/05/2020  . Constipation 03/05/2020  . Weight loss, abnormal 03/05/2020  . Closed wedge compression fracture of third lumbar vertebra ([place]) 03/05/2020  . Pneumonia of right middle lobe due to infectious organism 03/05/2020  . Abnormal findings on diagnostic imaging of lung 10/06/2019  . Urinary frequency 08/04/2019  . Community acquired pneumonia 11/02/2018  . Pneumonia 11/02/2018  . COPD exacerbation ([place]) 10/14/2018  . Influenza-like illness 10/04/2018  . Urinary incontinence 07/19/2018  . Fatigue 10/22/2017  . Hypokalemia 07/29/2017  . Driving safety issue 04/21/2017  . Acute right ankle pain 08/16/2016  . Bilateral hand pain 03/30/2016  . Bilateral hip pain 03/27/2016   . Loose stools 03/27/2016  . Low back pain 11/13/2015  . Cough 02/06/2015  . Weakness 07/19/2014  . Right inguinal hernia 03/04/2013  . Dementia, Alzheimer's, with behavior disturbance ([place]) 01/13/2013  . Obstructive chronic bronchitis without exacerbation ([place])   . Polyneuropathy in other diseases classified elsewhere ([place])   . PVD (peripheral vascular disease) ([place]) 05/04/2012  . Peripheral neuropathy 03/08/2012  . Bilateral leg pain 03/08/2012  . Peripheral edema 09/10/2011  . Encounter for well adult exam with abnormal findings 11/22/2010  . Weight loss 11/22/2010  . Anxiety 11/22/2010  . PARESTHESIA 09/02/2010  . Vitamin D deficiency 06/04/2010  . Depression 11/30/2009  . ANKLE PAIN, RIGHT 11/30/2009  . FATIGUE 11/30/2009  . PERIPHERAL EDEMA 11/16/2009  . SCIATICA, LEFT 11/08/2009  . Backache 10/08/2009  . SHOULDER PAIN, LEFT 06/21/2009  . Hemoptysis 04/24/2009  . 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 MYALGIA 02/04/2008  . Pain in Soft Tissues of Limb 02/04/2008  . Coronary atherosclerosis 10/27/2007  . PERIPHERAL VASCULAR DISEASE 10/27/2007  . Allergic rhinitis 10/27/2007  . Asthma 10/27/2007  . COPD (chronic obstructive pulmonary disease) ([place]) 10/27/2007  . BENIGN PROSTATIC HYPERTROPHY 10/27/2007  . RASH-NONVESICULAR 10/27/2007  . COLONIC POLYPS, HX OF 10/27/2007  . Hyperlipidemia 04/03/2007  . Essential hypertension 04/03/2007    Orientation RESPIRATION BLADDER Height & Weight     Self  Normal Indwelling catheter Weight: 49.8 kg Height:  5\' 8"  (172.7 cm)  BEHAVIORAL SYMPTOMS/MOOD NEUROLOGICAL BOWEL NUTRITION STATUS      Incontinent Diet (dysphagia 1)  AMBULATORY STATUS COMMUNICATION OF NEEDS Skin   Limited Assist Verbally (slow, & soft  w/words) Normal  Personal Care Assistance Level of Assistance  Bathing,Feeding,Dressing Bathing Assistance: Limited assistance Feeding assistance: Limited assistance Dressing Assistance:  Limited assistance     Functional Limitations Info  Sight,Hearing,Speech Sight Info: Impaired (eyeglasses) Hearing Info: Adequate Speech Info: Impaired (dysphagia 1 diet;partial uppers)    SPECIAL CARE FACTORS FREQUENCY                       Contractures Contractures Info: Not present    Additional Factors Info  Code Status,Allergies,Psychotropic Code Status Info:  (Full code) Allergies Info:  (Atorvastatin, Simvastatin, Zetia (Ezetimibe), Ace Inhibitors, Penicillins) Psychotropic Info:  (Aricept-dementia)         Current Medications (09/26/2020):  This is the current hospital active medication list Current Facility-Administered Medications  Medication Dose Route Frequency Provider Last Rate Last Admin  . acetaminophen (TYLENOL) tablet 650 mg  650 mg Oral Q6H PRN Lovey Newcomer T, NP   650 mg at 09/21/20 2341  . amLODipine (NORVASC) tablet 5 mg  5 mg Oral Daily Nita Sells, MD   5 mg at 09/26/20 0907  . aspirin EC tablet 81 mg  81 mg Oral Daily Nita Sells, MD   81 mg at 09/26/20 0907  . clindamycin (CLEOCIN) capsule 300 mg  300 mg Oral Q8H Nita Sells, MD   300 mg at 09/26/20 1325  . donepezil (ARICEPT) tablet 10 mg  10 mg Oral QHS Nita Sells, MD   10 mg at 09/25/20 2118  . feeding supplement (ENSURE ENLIVE / ENSURE PLUS) liquid 237 mL  237 mL Oral TID BM Samtani, Jai-Gurmukh, MD   237 mL at 09/26/20 1324  . heparin injection 5,000 Units  5,000 Units Subcutaneous Q8H Nita Sells, MD   5,000 Units at 09/26/20 1325  . ipratropium-albuterol (DUONEB) 0.5-2.5 (3) MG/3ML nebulizer solution 3 mL  3 mL Nebulization Q6H PRN Samtani, Jai-Gurmukh, MD      . potassium chloride (KLOR-CON) packet 40 mEq  40 mEq Oral BID Nita Sells, MD   40 mEq at 09/26/20 0908     Discharge Medications: Please see discharge summary for a list of discharge medications.  Relevant Imaging Results:  Relevant Lab Results:   Additional  Information SS#[id number] [phone], Juliann Pulse, [place]

## 2020-09-26 NOTE — Progress Notes (Signed)
Physical Therapy Treatment Patient Details Name: Matthew Gray MRN: [id number] DOB: 1934-01-16 Today's Date: 09/26/2020    History of Present Illness Pt is 87 year old black male known history of COPD DM CAD HLD CKD 2 bladder tumor status post TURP 08/09/2020.  Pt admitted with sepsis likely secondary to aspiration PNE.  He did have COVID 10/21    PT Comments    Pt soiled with BM at start of session. Sit to stand with Mod assist x 3 trials for pericare and then to pivot to recliner. Pt is able to stand ~30 seconds with RW, then becomes fatigued and sits. Pt c/o mouth pain, RN notified.    Follow Up Recommendations  SNF;Home health PT;Supervision/Assistance - 24 hour;Supervision for mobility/OOB (SNF recommended vs HHPT if family able to provide 74* assistance)     Equipment Recommendations  Rolling walker with 5" wheels;3in1 (PT);Wheelchair cushion (measurements PT);Wheelchair (measurements PT)    Recommendations for Other Services       Precautions / Restrictions Precautions Precautions: Fall Restrictions Weight Bearing Restrictions: No    Mobility  Bed Mobility Overal bed mobility: Needs Assistance Bed Mobility: Supine to Sit           General bed mobility comments: Required multimodal cues and mod A due to posterior lean; pt initiating however requiring assist to complete  Transfers Overall transfer level: Needs assistance Equipment used: Rolling walker (2 wheeled) Transfers: Sit to/from [facility] Sit to Stand: Mod assist Stand pivot transfers: Mod assist       General transfer comment: assist to rise and steady, posterior bias throughout stance and transfer to recliner requiring assist; sit to stand x 3 trials (pt was soiled with BM so stood with RW for pericare), Pt is able to stand ~30 seconds, then fatigues and returns to sitting.  Ambulation/Gait                 Stairs             Wheelchair Mobility    Modified Rankin  (Stroke Patients Only)       Balance Overall balance assessment: Needs assistance Sitting-balance support: Bilateral upper extremity supported Sitting balance-Leahy Scale: Poor   Postural control: Posterior lean Standing balance support: Bilateral upper extremity supported Standing balance-Leahy Scale: Poor Standing balance comment: required at least mod assist for stability                            Cognition Arousal/Alertness: Lethargic Behavior During Therapy: WFL for tasks assessed/performed Overall Cognitive Status: Impaired/Different from baseline                                 General Comments: Pt does have hx of dementia but is more lethargic and only oriented to self. He followed commands with initiation and multimodal commands.      Exercises      General Comments        Pertinent Vitals/Pain Pain Assessment: Faces Faces Pain Scale: Hurts little more Pain Location: mouth Pain Descriptors / Indicators: Sore Pain Intervention(s): Limited activity within patient's tolerance;Monitored during session;Patient requesting pain meds-RN notified    Home Living                      Prior Function            PT Goals (current goals can now be found  in the care plan section) Acute Rehab PT Goals Patient Stated Goal: dtr states return home (perprior note) PT Goal Formulation: With patient Time For Goal Achievement: 10/05/20 Potential to Achieve Goals: Fair Progress towards PT goals: Progressing toward goals    Frequency    Min 3X/week      PT Plan Current plan remains appropriate    Co-evaluation              AM-PAC PT "6 Clicks" Mobility   Outcome Measure  Help needed turning from your back to your side while in a flat bed without using bedrails?: A Lot Help needed moving from lying on your back to sitting on the side of a flat bed without using bedrails?: A Lot Help needed moving to and from a bed to a chair  (including a wheelchair)?: A Lot Help needed standing up from a chair using your arms (e.g., wheelchair or bedside chair)?: A Lot Help needed to walk in hospital room?: A Lot Help needed climbing 3-5 steps with a railing? : Total 6 Click Score: 11    End of Session Equipment Utilized During Treatment: Gait belt Activity Tolerance: Patient limited by fatigue Patient left: in chair;with call bell/phone within reach;with chair alarm set Nurse Communication: Mobility status PT Visit Diagnosis: Unsteadiness on feet (R26.81);Muscle weakness (generalized) (M62.81)     Time: 8329-1916 PT Time Calculation (min) (ACUTE ONLY): 23 min  Charges:  $Therapeutic Activity: 23-37 mins                     Blondell Reveal Kistler PT 09/26/2020  Acute Rehabilitation Services Pager [phone] Office [phone]

## 2020-09-26 NOTE — TOC Progression Note (Incomplete)
Transition of Care [facility]) - Progression Note    Patient Details  Name: Matthew Gray MRN: [id number] Date of Birth: 1934-04-21  [facility] CM/SW Contact  Duilio Heritage, Juliann Pulse, RN Phone Number: 09/26/2020, 2:06 PM  Clinical Narrative: Sharia Reeve dtr U[place] is @ a funeral of the patient's sister. Will need to confirm dme-currently-      Expected Discharge Plan: [place] Barriers to Discharge: Continued Medical Work up  Expected Discharge Plan and Services Expected Discharge Plan: [place]   Discharge Planning Services: CM Consult Post Acute Care Choice: [place] arrangements for the past 2 months: Single Family Home                 DME Arranged: 3-N-1,Hospital bed,Wheelchair manual,Walker rolling   Date DME Agency Contacted: 09/25/20 Time DME Agency Contacted: [id number] Representative spoke with at DME Agency: [place]: PT,OT,Nurse's [place] Work [place]: [facility] [employer] [facility] [employer] Date [place]: 09/25/20 Time [place]: [place] Representative spoke with at [place]: [place] ([place]) Interventions    Readmission Risk Interventions Readmission Risk Prevention Plan 09/21/2020  Transportation Screening Complete  PCP or Specialist Appt within 3-5 Days Complete  HRI or [place] Complete  Social Work Consult for [place] Planning/Counseling Complete  Palliative Care Screening Complete  Medication Review Press photographer) Complete  Some recent data might be hidden

## 2020-09-27 DIAGNOSIS — C679 Malignant neoplasm of bladder, unspecified: Secondary | ICD-10-CM

## 2020-09-27 DIAGNOSIS — J690 Pneumonitis due to inhalation of food and vomit: Secondary | ICD-10-CM | POA: Diagnosis not present

## 2020-09-27 DIAGNOSIS — J189 Pneumonia, unspecified organism: Secondary | ICD-10-CM

## 2020-09-27 DIAGNOSIS — E43 Unspecified severe protein-calorie malnutrition: Secondary | ICD-10-CM | POA: Diagnosis not present

## 2020-09-27 DIAGNOSIS — R1310 Dysphagia, unspecified: Secondary | ICD-10-CM | POA: Diagnosis not present

## 2020-09-27 MED ORDER — QUETIAPINE FUMARATE 25 MG PO TABS
25.0000 mg | ORAL_TABLET | Freq: Two times a day (BID) | ORAL | Status: DC
Start: 2020-09-27 — End: 2020-09-30
  Administered 2020-09-27 – 2020-09-29 (×4): 25 mg via ORAL
  Filled 2020-09-27 (×4): qty 1

## 2020-09-27 NOTE — Progress Notes (Signed)
Manufacturing engineer [facility])  Received request from [facility] for hospice services at home after discharge.  Chart and pt information under review by [place] [facility] physician.  Hospice eligibility pending at this time.  Hospital liaison spoke with pt's daughter [name] to initiate education related to hospice philosophy and services and to answer any questions at this time.  [name] verbalized understanding of information given and asked about role of [employer] after hospice service elected, specifically asking about a benefit of [employer] that might pay her to serve as caregiver now that her father is not longer abe to go to daycare.  Advised that under hospice she would not have access to this benefit, asked if her father had medicaid.  [name] unsure; erified with Juliann Pulse, TOC, that Medicaid is not listed on pt's chart.  [name] shares that she will consider hospice and reach out to [facility] with her decision.   ACC information and contact numbers given to [place] information shared with [place] Manager.  Please call with any questions or concerns.  Thank you for the opportunity to participate in this pt's care.  Domenic Moras, BSN, RN [employer] [phone] (24h on call)

## 2020-09-27 NOTE — TOC Progression Note (Signed)
Transition of Care [facility]) - Progression Note    Patient Details  Name: Matthew Gray MRN: [id number] Date of Birth: 1934-04-09  Transition of [facility]) CM/SW Contact  Nina Hoar, Juliann Pulse, RN Phone Number: 09/27/2020, 2:20 PM  Clinical Narrative: Patient is now being referred for home hospice-TC dtr [name]-states she is willing to talk to [place] care to find out about services-[facility] care rep Chrslyn will talk to dtr-[name]-await outcome. Noted DNR-will need MD signature.      Expected Discharge Plan: [place] Barriers to Discharge: Continued Medical Work up  Expected Discharge Plan and Services Expected Discharge Plan: [place]   Discharge Planning Services: CM Consult Post Acute Care Choice: [place] arrangements for the past 2 months: Single Family Home                 DME Arranged: 3-N-1,Hospital bed,Wheelchair manual,Walker rolling   Date DME Agency Contacted: 09/25/20 Time DME Agency Contacted: [id number] Representative spoke with at DME Agency: [place]: PT,OT,Nurse's Aide,Social Work [place]: [facility] [employer] [facility] [employer] Date [place]: 09/25/20 Time [place]: [place] Representative spoke with at [place]: [place] ([place]) Interventions    Readmission Risk Interventions Readmission Risk Prevention Plan 09/21/2020  Transportation Screening Complete  PCP or Specialist Appt within 3-5 Days Complete  HRI or [place] Complete  Social Work Consult for [place] Planning/Counseling Complete  Palliative Care Screening Complete  Medication Review Press photographer) Complete  Some recent data might be hidden

## 2020-09-27 NOTE — Care Plan (Signed)
Advance Care Planning   Purpose of Encounter Discussed goals of care and current diagnoses   Parties in Attendance Daughter [name] at bedside  Discussion:  Discussed in detail regarding  aspiration pneumonia, speech therapy assessment, likelihood of recurrent aspiration which can result in death, dementia with progression, bladder cancer with suspected metastasis, treatment plan and prognosis.  All questions answered.  She understands is clinical condition is likely to continue to decline, aspiration again is likely and cancer is likely metastatic.   Discussed CODE STATUS.    Daughter reported DNR was the desired status.  CODE STATUS updated.  Murray Hodgkins, MD [facility]

## 2020-09-27 NOTE — Progress Notes (Addendum)
PROGRESS NOTE  Matthew Gray YTK:[id number] DOB: [birth year] DOA: 09/19/2020 PCP: Biagio Borg, MD  Brief History   11yom PMH dementia, bladder cancer, COPD, recurrent pneumonia, presented with generalized weakness, cough, hypoxia, failure to thrive.  Admitted for recurrent aspiration pneumonia with acute hypoxia.  Critically improved with clinical resolution of pneumonia.  However noted to have significant dysphagia and given likelihood of recurrent aspiration given clinical decline with dementia as well as metastatic bladder cancer, discussed in detail with daughter, elected DNR status, considering hospice.  Plan is to return home 2/4.  A & P  Sepsis secondary to aspiration pneumonia secondary to dysphagia, with associated acute hypoxic respiratory failure.   --Sepsis resolved.  Pneumonia appears resolved.  Completed antibiotics today.  Pleural effusions asymptomatic, no further treatment.  Dysphagia, solid --Continue diet as per speech therapy  COPD --Remains stable stable.  CKD stage II --Acute kidney injury ruled out  Microcytic anemia --Stable.  Follow-up as an outpatient.  Severe malnutrition --Continue Ensure  High-grade papillary urothelial carcinoma, status post TURBT, known malignancy with suspected metastatic spread to bone --Consider follow-up with radiation oncology and oncology as an outpatient.  Consider bone scan to assess for extent of osseous disease.  However hospice would be appropriate.  Adult failure to thrive in the setting of advanced dementia dementia, goes to daycare --Stable.  Outpatient PMD recommended.  Continue Aricept.  Infrarenal aortic and left common iliac artery aneurysms measuring 3.1 and 3 cm respectively.  --Recommend follow-up every 3 years.  Aortic atherosclerosis --No inpatient treatment warranted   Nutritional Assessment: Body mass index is 16.7 kg/m.Marland Kitchen Seen by dietician.  I agree with the assessment and plan as outlined  below: Nutrition Status: Nutrition Problem: Severe Malnutrition Etiology: cancer and cancer related treatments,chronic illness Signs/Symptoms: estimated needs,percent weight loss (9% weight loss in 3 months.) Percent weight loss: 9 % Interventions: Ensure Enlive (each supplement provides 350kcal and 20 grams of protein),Refer to RD note for recommendations  Disposition Plan:  Discussion: Appears to be stabilizing medically.  Anticipate discharge home 2/4 with daughter.  Goals of care discussed separately, briefly here DNR, treat the treatable.  Referred to hospice.  Status is: Inpatient  Remains inpatient appropriate because:Inpatient level of care appropriate due to severity of illness   Dispo: The patient is from: Home              Anticipated d/c is to: Home              Anticipated d/c date is: 1 day              Patient currently is not medically stable to d/c.   Difficult to place patient No  DVT prophylaxis: heparin injection 5,000 Units Start: 09/20/20 0930   Code Status: DNR Level of care: Med-Surg Family Communication: daughter at bedside  Murray Hodgkins, MD  [facility] Direct contact: see [email or web address] (further directions at bottom of note if needed) 7PM-7AM contact night coverage as at bottom of note 09/27/2020, 5:58 PM  LOS: 8 days   Significant Hospital Events   .    Consults:  .    Procedures:  .   Significant Diagnostic Tests:  Marland Kitchen    Micro Data:  .    Antimicrobials:  .   Interval History/Subjective  CC: f/u pneumonia  Confused.  Daughter at bedside.  Objective   Vitals:  Vitals:   09/27/20 0611 09/27/20 1257  BP: 119/82 116/80  Pulse: 97 100  Resp: 20 20  Temp: 99.2  F (37.3 C) 100.2 F (37.9 C)  SpO2: 97% 98%    Exam:  Constitutional:   . Appears calm and comfortable ENMT:  . grossly normal hearing  Respiratory:  . CTA bilaterally, no w/r/r.  . Respiratory effort normal.  Cardiovascular:  . RRR, no m/r/g . No LE  extremity edema   Psychiatric:  . Mental status o Mood, affect appropriate  I have personally reviewed the following:   Today's Data  . No new data  Scheduled Meds: . amLODipine  5 mg Oral Daily  . aspirin EC  81 mg Oral Daily  . donepezil  10 mg Oral QHS  . feeding supplement  237 mL Oral TID BM  . heparin  5,000 Units Subcutaneous Q8H  . potassium chloride  40 mEq Oral BID   Continuous Infusions:  Principal Problem:   Sepsis ([place]) Active Problems:   Essential hypertension   COPD (chronic obstructive pulmonary disease) (HCC)   Acute renal failure superimposed on chronic kidney disease (HCC)   Dysphagia   Acute hypoxemic respiratory failure (HCC)   Aspiration pneumonia (HCC)   Protein-calorie malnutrition, severe   Microcytic anemia   FTT (failure to thrive) in adult   Aortic aneurysm ([place])   Aortic atherosclerosis ([place])   LOS: 8 days   How to contact the [facility] Attending or Consulting provider 7A - 7P or covering provider during after hours 7P -7A, for this patient?  1. Check the care team in [facility] and look for a) attending/consulting TRH provider listed and b) the [facility] team listed 2. Log into [email or web address] and use [place]'s universal password to access. If you do not have the password, please contact the hospital operator. 3. Locate the [facility] provider you are looking for under [facility] and page to a number that you can be directly reached. 4. If you still have difficulty reaching the provider, please page the [facility] (Director on Call) for the Hospitalists listed on amion for assistance.

## 2020-09-27 NOTE — Progress Notes (Signed)
  Speech Language Pathology Treatment: Dysphagia  Patient Details Name: Matthew Gray MRN: [id number] DOB: May 06, 1934 Today's Date: 09/27/2020 Time: 4196-2229 SLP Time Calculation (min) (ACUTE ONLY): 50 min  Assessment / Plan / Recommendation Clinical Impression  SLP session to provide education to daughter *who is HCPOA* regarding pt's dementia, COPD and aspiration impacting his aspiration pna risk.  Extensive education re: dysphagia/dementia, potential diet modifications in the future and alternative means of nutrition not changing outcomes for patients -not imrpoving QOL, decreassing morbidity nor mortality.    Daughter advises she had already decided that pt would not having feeding tube.  Advise consideration for pallaitive consult to establish GOC for pt given dementia, recurrent pneumonias, COPD, falls, etc.  Explained palliative is to elicit goals of pt/family with chronic medical conditions and help with symptoms management.  Matthew Gray stated her goals are for her dad to get the best care, have QOL and indicated acceptance of when the Lord wants to take him.  Given pt's pharyngeal swallow is strong, at this time, recommend continue puree/thin and modify diet baed on pt's performance including using thickener or having pt consume Ensure if pt is overtly coughing with intake.  Pt continues with prolonged oral manipulation/delay and this allows premature spillage of liquids which likely allows intermittent aspiration.  Advised that as dementia progresses or pt becomes more deconditioned, his pharyngeal swallow may be weaker resulting in retention and increased causticity of aspirating solids, very thick items over thin.  Today pt consuming soup via tsp and thin water during SLP session being fed by daughter and SLP.  Demonstrated modification of styrofoam cup to maximize airway protection.  Daughter is mindful of pt's swallowing and asking good questions re: his dysphagia - as well as self educating.  No SLP  follow up needed as all education completed to help mitigate aspiration.    HPI HPI: pt is an 87 yo male admitted to [facility] after fall - diagnosed with FTT and pna *right more than left.  Pt has h/o dementia, recurrent pnas, chronic bronchitis.  Pt has undergone MBS in thte past with findings of mild dysphagia - coughing throughout testing without infiltration of barium into larynx/trachea.  Pt repeated prior CXRs 03/05/2020, 03/2020, 04/2020 and 08/2020 showing concern for recurrent pnas.  Pt today admits to dysphagia prior to admission - coughing with liquids more than with foods.  Pt is a full code at this time.      SLP Plan  Continue with current plan of care       Recommendations  Diet recommendations: Dysphagia 1 (puree);Thin liquid Liquids provided via: Cup;Straw;Teaspoon Medication Administration: Crushed with puree Compensations: Slow rate;Small sips/bites;Minimize environmental distractions Postural Changes and/or Swallow Maneuvers: Upright 30-60 min after meal;Seated upright 90 degrees                Oral Care Recommendations: Oral care QID SLP Visit Diagnosis: Dysphagia, oropharyngeal phase (R13.12) Plan: Continue with current plan of care       GO                Macario Golds 09/27/2020, 11:01 AM  Kathleen Lime, MS [facility] SLP [place] Office [phone] Pager [phone]

## 2020-09-27 NOTE — Care Management (Signed)
    Durable Medical Equipment  (From admission, onward)         Start     Ordered   09/27/20 1313  For home use only DME standard manual wheelchair with seat cushion  Once       Comments: Patient suffers from advanced dementia which impairs their ability to perform daily activities like bathing, dressing, grooming, and toileting in the home.  A cane, crutch, or walker will not resolve issue with performing activities of daily living. A wheelchair will allow patient to safely perform daily activities. Patient can safely propel the wheelchair in the home or has a caregiver who can provide assistance. Length of need Lifetime. Accessories: elevating leg rests (ELRs), wheel locks, extensions and anti-tippers.   09/27/20 1312   09/27/20 1312  For home use only DME Hospital bed  Once       Question Answer Comment  Length of Need Lifetime   Patient has (list medical condition): advanced dementia, recurrent aspiration   The above medical condition requires: Patient requires the ability to reposition frequently   Head must be elevated greater than: 45 degrees   Bed type Semi-electric      09/27/20 1312   09/27/20 1300  For home use only DME 3 n 1  Once        09/27/20 1259   09/27/20 1259  For home use only DME Walker rolling  Once       Question Answer Comment  Walker: With [place]   Patient needs a walker to treat with the following condition Unsteady gait      09/27/20 1258

## 2020-09-28 ENCOUNTER — Telehealth: Payer: Self-pay | Admitting: Internal Medicine

## 2020-09-28 DIAGNOSIS — J690 Pneumonitis due to inhalation of food and vomit: Secondary | ICD-10-CM | POA: Diagnosis not present

## 2020-09-28 DIAGNOSIS — C679 Malignant neoplasm of bladder, unspecified: Secondary | ICD-10-CM | POA: Diagnosis not present

## 2020-09-28 DIAGNOSIS — D509 Iron deficiency anemia, unspecified: Secondary | ICD-10-CM | POA: Diagnosis not present

## 2020-09-28 MED ORDER — QUETIAPINE FUMARATE 25 MG PO TABS: 25.0000 mg | ORAL_TABLET | Freq: Every day | ORAL | Status: AC

## 2020-09-28 NOTE — Progress Notes (Signed)
[facility])  Referral received from [place] for hospice services at home.  Spoke with [name], offered support, explained services and answered questions.  DME discussed, pt needs bed, 3n1, WC, walker. [name] has someone coming this afternoon to move furniture around to accommodate needed DME, [place] will then order DME. Likely this will not be in place until Saturday afternoon.  If anticipated comfort scripts are needed, please arrange for those to be sent to [facility]. [facility] likely will not admit him to our services until the day after he is at home, so this will ensure there is no lapse in his comfort.   Venia Carbon RN, BSN, [place] [facility] Liaison

## 2020-09-28 NOTE — Telephone Encounter (Signed)
   Hospice referral Tammy from Hospice calling to confirm Dr Jenny Reichmann will be attending. Phone [phone]

## 2020-09-28 NOTE — Plan of Care (Signed)
  Problem: Health Behavior/Discharge Planning: Goal: Ability to manage health-related needs will improve Outcome: Progressing   Problem: Clinical Measurements: Goal: Will remain free from infection Outcome: Progressing   Problem: Skin Integrity: Goal: Risk for impaired skin integrity will decrease Outcome: Progressing   Problem: Clinical Measurements: Goal: Respiratory complications will improve Outcome: Completed/Met

## 2020-09-28 NOTE — Progress Notes (Signed)
Physical Therapy Treatment Patient Details Name: Matthew Gray MRN: [id number] DOB: 05-Mar-1934 Today's Date: 09/28/2020    History of Present Illness Pt is 87 year old black male known history of COPD DM CAD HLD CKD 2 bladder tumor status post TURP 08/09/2020.  Pt admitted with sepsis likely secondary to aspiration PNE.  He did have COVID 10/21    PT Comments    RN and nurse tech unable to assist pt to standing and requested assist.  Pt requiring mod assist +2 to stand and pivot to recliner.  Pt requiring frequent cues to remain awake and look upright.  Current d/c plan is for home with hospice.  Pt will need 24/7 care/assit upon d/c.   Follow Up Recommendations  Supervision/Assistance - 24 hour;SNF (if not home with hospice)     Equipment Recommendations  Rolling walker with 5" wheels;3in1 (PT);Wheelchair cushion (measurements PT);Wheelchair (measurements PT);Hospital bed    Recommendations for Other Services       Precautions / Restrictions Precautions Precautions: Fall    Mobility  Bed Mobility Overal bed mobility: Needs Assistance             General bed mobility comments: assisted by nursing, pt on EOB with trunk leaning against HOB; required assist to sit fully upright  Transfers Overall transfer level: Needs assistance Equipment used: Rolling walker (2 wheeled) Transfers: Sit to/from [facility] Sit to Stand: Mod assist;+2 physical assistance         General transfer comment: assist to rise and steady, posterior bias throughout stance and transfer to recliner requiring assist; assist to manuever RW, RN and tech in room  Ambulation/Gait                 Stairs             Wheelchair Mobility    Modified Rankin (Stroke Patients Only)       Balance Overall balance assessment: Needs assistance Sitting-balance support: Bilateral upper extremity supported;Feet supported Sitting balance-Leahy Scale: Zero   Postural control:  Posterior lean;Right lateral lean Standing balance support: Bilateral upper extremity supported Standing balance-Leahy Scale: Poor Standing balance comment: required at least mod assist for stability                            Cognition Arousal/Alertness: Awake/alert Behavior During Therapy: WFL for tasks assessed/performed Overall Cognitive Status: Impaired/Different from baseline                                 General Comments: hx dementia; remains sleepy but able to follow commands with multimodal cues, cues to keep eyes open and look upright (head mostly hanging down and to left at rest)      Exercises      General Comments        Pertinent Vitals/Pain Pain Assessment: Faces Faces Pain Scale: No hurt    Home Living                      Prior Function            PT Goals (current goals can now be found in the care plan section) Progress towards PT goals: Progressing toward goals    Frequency    Min 2X/week      PT Plan Current plan remains appropriate;Frequency needs to be updated    Co-evaluation  AM-PAC PT "6 Clicks" Mobility   Outcome Measure  Help needed turning from your back to your side while in a flat bed without using bedrails?: A Lot Help needed moving from lying on your back to sitting on the side of a flat bed without using bedrails?: A Lot Help needed moving to and from a bed to a chair (including a wheelchair)?: A Lot Help needed standing up from a chair using your arms (e.g., wheelchair or bedside chair)?: A Lot Help needed to walk in hospital room?: A Lot Help needed climbing 3-5 steps with a railing? : Total 6 Click Score: 11    End of Session Equipment Utilized During Treatment: Gait belt Activity Tolerance: Patient limited by fatigue Patient left: in chair;with call bell/phone within reach;with chair alarm set;with family/visitor present;with nursing/sitter in room Nurse  Communication: Need for lift equipment (recommended stedy for assist back to bed) PT Visit Diagnosis: Unsteadiness on feet (R26.81);Muscle weakness (generalized) (M62.81)     Time: 0347-4259 PT Time Calculation (min) (ACUTE ONLY): 10 min  Charges:  $Therapeutic Activity: 8-22 mins                     Jannette Spanner PT, DPT Acute Rehabilitation Services Pager: [phone] Office: [phone]  Wm Fruchter,KATHrine E 09/28/2020, 1:10 PM

## 2020-09-28 NOTE — Progress Notes (Addendum)
PROGRESS NOTE  Matthew Gray EGB:[id number] DOB: 05-Dec-1933 DOA: 09/19/2020 PCP: Biagio Borg, MD  Brief History   51yom PMH dementia, bladder cancer, COPD, recurrent pneumonia, presented with generalized weakness, cough, hypoxia, failure to thrive.  Admitted for recurrent aspiration pneumonia with acute hypoxia.  Critically improved with clinical resolution of pneumonia.  However noted to have significant dysphagia and given likelihood of recurrent aspiration given clinical decline with dementia as well as metastatic bladder cancer, discussed in detail with daughter, elected DNR status, hospice.  Anticipate home 1/5.  A & P  Sepsis secondary to aspiration pneumonia secondary to dysphagia, with associated acute hypoxic respiratory failure.   --Sepsis resolved.  Pneumonia appears resolved.  Completed antibiotics.  Pleural effusions asymptomatic, no further treatment. --WBC significantly elevated, probably reflects recurrent aspiration, no treatment warranted at this point given goals of care.  Dysphagia, solid --Continue diet as per speech therapy  COPD --Remains stable stable.  CKD stage II --Acute kidney injury ruled out  Microcytic anemia --Stable.  No further follow-up.  Severe malnutrition --Continue Ensure  High-grade papillary urothelial carcinoma, status post TURBT, known malignancy with suspected metastatic spread to bone --Discussed in detail with Dr. Louis Meckel who recommended hospice, Dr. Tammi Klippel who reported radiation was planned to the bladder to prevent bleeding, however hospice is quite reasonable.  Also discussed with oncology informally, recommended hospice  Adult failure to thrive in the setting of advanced dementia dementia, goes to daycare --Stable.    Infrarenal aortic and left common iliac artery aneurysms measuring 3.1 and 3 cm respectively.  --No follow-up indicated  Aortic atherosclerosis --No treatment indicated   Nutritional Assessment: Body mass index  is 16.7 kg/m.Marland Kitchen Seen by dietician.  I agree with the assessment and plan as outlined below: Nutrition Status: Nutrition Problem: Severe Malnutrition Etiology: cancer and cancer related treatments,chronic illness Signs/Symptoms: estimated needs,percent weight loss (9% weight loss in 3 months.) Percent weight loss: 9 % Interventions: Ensure Enlive (each supplement provides 350kcal and 20 grams of protein),Refer to RD note for recommendations  Disposition Plan:  Discussion: Overall stable.  Awaiting delivery of hospital bed to provide for safe discharge.  Discussed in detail again with daughter at bedside, we reviewed laboratory studies, imaging and clinical course, we went over a detail the patient's diagnoses, prognosis as advised by oncology, urology of less than 6 months.  She has elected hospice which I think is the best course of care.  Anticipate discharge tomorrow after equipment delivered. Time greater than 35 minutes, greater than 50% counseling and coordination of care.  Status is: Inpatient  Remains inpatient appropriate because:Inpatient level of care appropriate due to severity of illness   Dispo: The patient is from: Home              Anticipated d/c is to: Home              Anticipated d/c date is: 1 day              Patient currently is medically stable to d/c.   Difficult to place patient No  DVT prophylaxis:    Code Status: DNR Level of care: Med-Surg Family Communication: daughter at bedside 2/4  Murray Hodgkins, MD  [facility] Direct contact: see [email or web address] (further directions at bottom of note if needed) 7PM-7AM contact night coverage as at bottom of note 09/28/2020, 7:25 PM  LOS: 9 days    Interval History/Subjective  CC: f/u pneumonia  Had a better night last night per daughter.  Confused and not  able to offer history.  Objective   Vitals:  Vitals:   09/28/20 0454 09/28/20 1511  BP: 109/71 114/79  Pulse: 97 100  Resp: 20 17  Temp: 98.4 F  (36.9 C) 98 F (36.7 C)  SpO2: 95% 94%    Exam:  Constitutional:   . Appears calm and comfortable ENMT:  . grossly normal hearing  Respiratory:  . CTA bilaterally, no w/r/r.  . Respiratory effort normal.  Cardiovascular:  . RRR, no m/r/g . No LE extremity edema   Psychiatric:  . Mental status . confused  I have personally reviewed the following:   Today's Data  . No new data today  Scheduled Meds: . amLODipine  5 mg Oral Daily  . aspirin EC  81 mg Oral Daily  . donepezil  10 mg Oral QHS  . feeding supplement  237 mL Oral TID BM  . QUEtiapine  25 mg Oral BID   Continuous Infusions:  Principal Problem:   Sepsis ([place]) Active Problems:   Essential hypertension   COPD (chronic obstructive pulmonary disease) (HCC)   Acute renal failure superimposed on chronic kidney disease (HCC)   Dysphagia   Acute hypoxemic respiratory failure (HCC)   Aspiration pneumonia (HCC)   Protein-calorie malnutrition, severe   Microcytic anemia   FTT (failure to thrive) in adult   Aortic aneurysm ([place])   Aortic atherosclerosis ([place])   LOS: 9 days   How to contact the [facility] Attending or Consulting provider [place] or covering provider during after hours [place], for this patient?  1. Check the care team in [facility] and look for a) attending/consulting TRH provider listed and b) the [facility] team listed 2. Log into [email or web address] and use [place]'s universal password to access. If you do not have the password, please contact the hospital operator. 3. Locate the [facility] provider you are looking for under [facility] and page to a number that you can be directly reached. 4. If you still have difficulty reaching the provider, please page the [facility] (Director on Call) for the Hospitalists listed on amion for assistance.

## 2020-09-28 NOTE — TOC Transition Note (Addendum)
Transition of Care [facility]) - CM/SW Discharge Note   Patient Details  Name: Matthew Gray MRN: [id number] Date of Birth: [birth year]  Transition of Care [facility]) CM/SW Contact:  Dessa Phi, RN Phone Number: 09/28/2020, 9:21 AM   Clinical Narrative: Patient's dtr Matthew Gray-agree to home w/hospice services-[facility] care rep [facility] aware & following for services, & dme-they will make arrangements for delivery of dme.Hospital bed must be in home prior to patient's d/c. PTAR for transport.No further CM needs.   11:18a-per [place] trying to get current bed out of house today, therefore dme will not be delivered until tomorrow-MD updated.   Final next level of care: Home w Hospice Care Barriers to Discharge: No Barriers Identified   Patient Goals and CMS Choice Patient states their goals for this hospitalization and ongoing recovery are:: go home CMS Medicare.gov Compare Post Acute Care list provided to:: Patient Represenative (must comment) Choice offered to / list presented to : Adult Children  Discharge Placement                       Discharge Plan and Services   Discharge Planning Services: CM Consult   Date DME Agency Contacted: 09/25/20 Time DME Agency Contacted: [phone] Representative spoke with at DME Agency: [place]: RN [facility] Agency: Hospice and [place] Date [place]: 09/28/20 Time [place]: 0920 Representative spoke with at [place]: [place] ([place]) Interventions     Readmission Risk Interventions Readmission Risk Prevention Plan 09/21/2020  Transportation Screening Complete  PCP or Specialist Appt within 3-5 Days Complete  HRI or [place] Complete  Social Work Consult for [place] Planning/Counseling Complete  Palliative Care Screening Complete  Medication Review [employer]) Complete  Some recent data might be hidden

## 2020-09-28 NOTE — Telephone Encounter (Signed)
Verbal order given  

## 2020-09-28 NOTE — Telephone Encounter (Signed)
Ok, ,and also ok for hospice MD to handle symptomatic medicaitons, thanks

## 2020-09-29 ENCOUNTER — Encounter (HOSPITAL_COMMUNITY): Payer: Self-pay | Admitting: Internal Medicine

## 2020-09-29 DIAGNOSIS — R1310 Dysphagia, unspecified: Secondary | ICD-10-CM | POA: Diagnosis not present

## 2020-09-29 DIAGNOSIS — J9601 Acute respiratory failure with hypoxia: Secondary | ICD-10-CM | POA: Diagnosis not present

## 2020-09-29 DIAGNOSIS — J690 Pneumonitis due to inhalation of food and vomit: Secondary | ICD-10-CM | POA: Diagnosis not present

## 2020-09-29 DIAGNOSIS — A419 Sepsis, unspecified organism: Secondary | ICD-10-CM | POA: Diagnosis not present

## 2020-09-29 LAB — CULTURE, BLOOD (ROUTINE X 2)
Culture: NO GROWTH
Culture: NO GROWTH
Special Requests: ADEQUATE
Special Requests: ADEQUATE

## 2020-09-29 NOTE — Plan of Care (Signed)
  Problem: Health Behavior/Discharge Planning: Goal: Ability to manage health-related needs will improve Outcome: Adequate for Discharge   Problem: Clinical Measurements: Goal: Ability to maintain clinical measurements within normal limits will improve Outcome: Adequate for Discharge Goal: Will remain free from infection Outcome: Adequate for Discharge Goal: Diagnostic test results will improve Outcome: Adequate for Discharge Goal: Cardiovascular complication will be avoided Outcome: Adequate for Discharge   Problem: Activity: Goal: Risk for activity intolerance will decrease Outcome: Adequate for Discharge   Problem: Nutrition: Goal: Adequate nutrition will be maintained Outcome: Adequate for Discharge   Problem: Coping: Goal: Level of anxiety will decrease Outcome: Adequate for Discharge   Problem: Elimination: Goal: Will not experience complications related to bowel motility Outcome: Adequate for Discharge Goal: Will not experience complications related to urinary retention Outcome: Adequate for Discharge   Problem: Pain Managment: Goal: General experience of comfort will improve Outcome: Adequate for Discharge   Problem: Safety: Goal: Ability to remain free from injury will improve Outcome: Adequate for Discharge   Problem: Skin Integrity: Goal: Risk for impaired skin integrity will decrease Outcome: Adequate for Discharge

## 2020-09-29 NOTE — Progress Notes (Signed)
Contacted [facility] to f/u with home DME. Spoke with Chrislyn and asked if the DME was delivered yesterday. She reports that she will call me back.  Received call from Rayne, she reports that the DME will be delivered today, she spoke to pt's daughter and she is aware that someone has to be at the house to receive the DME.  Will continue to f/u to assist with the D/C plan and will arrange transportation once the DME has been delivered.

## 2020-09-29 NOTE — Progress Notes (Signed)
Manufacturing engineer [facility])  Pease send signed and completed DNR home with pt/family.  Please provide prescriptions at discharge as needed to ensure ongoing symptom management until pt can be admitted onto hospice services.    DME confirmed by daughter [name] to be delivered and ready at home.  TOC Jeannette made aware.  ACC information and contact numbers given to [facility].  Above information shared with Aline August Manager.  Please call with any questions or concerns.  Thank you for the opportunity to participate in this pt's care.  Domenic Moras, BSN, RN [employer] [phone] (24h on call)

## 2020-09-29 NOTE — Plan of Care (Signed)
  Problem: Health Behavior/Discharge Planning: Goal: Ability to manage health-related needs will improve 09/29/2020 1819 by Lennie Hummer, RN Outcome: Adequate for Discharge 09/29/2020 1231 by Lennie Hummer, RN Outcome: Adequate for Discharge   Problem: Clinical Measurements: Goal: Ability to maintain clinical measurements within normal limits will improve 09/29/2020 1819 by Lennie Hummer, RN Outcome: Adequate for Discharge 09/29/2020 1231 by Lennie Hummer, RN Outcome: Adequate for Discharge Goal: Will remain free from infection 09/29/2020 1819 by Lennie Hummer, RN Outcome: Adequate for Discharge 09/29/2020 1231 by Lennie Hummer, RN Outcome: Adequate for Discharge Goal: Diagnostic test results will improve 09/29/2020 1819 by Lennie Hummer, RN Outcome: Adequate for Discharge 09/29/2020 1231 by Lennie Hummer, RN Outcome: Adequate for Discharge Goal: Cardiovascular complication will be avoided 09/29/2020 1819 by Lennie Hummer, RN Outcome: Adequate for Discharge 09/29/2020 1231 by Lennie Hummer, RN Outcome: Adequate for Discharge   Problem: Activity: Goal: Risk for activity intolerance will decrease 09/29/2020 1819 by Lennie Hummer, RN Outcome: Adequate for Discharge 09/29/2020 1231 by Lennie Hummer, RN Outcome: Adequate for Discharge   Problem: Nutrition: Goal: Adequate nutrition will be maintained 09/29/2020 1819 by Lennie Hummer, RN Outcome: Adequate for Discharge 09/29/2020 1231 by Lennie Hummer, RN Outcome: Adequate for Discharge   Problem: Coping: Goal: Level of anxiety will decrease 09/29/2020 1819 by Lennie Hummer, RN Outcome: Adequate for Discharge 09/29/2020 1231 by Lennie Hummer, RN Outcome: Adequate for Discharge   Problem: Elimination: Goal: Will not experience complications related to bowel motility 09/29/2020 1819 by Lennie Hummer, RN Outcome: Adequate for Discharge 09/29/2020 1231 by Lennie Hummer, RN Outcome: Adequate for Discharge Goal: Will not  experience complications related to urinary retention 09/29/2020 1819 by Lennie Hummer, RN Outcome: Adequate for Discharge 09/29/2020 1231 by Lennie Hummer, RN Outcome: Adequate for Discharge   Problem: Pain Managment: Goal: General experience of comfort will improve 09/29/2020 1819 by Lennie Hummer, RN Outcome: Adequate for Discharge 09/29/2020 1231 by Lennie Hummer, RN Outcome: Adequate for Discharge   Problem: Safety: Goal: Ability to remain free from injury will improve 09/29/2020 1819 by Lennie Hummer, RN Outcome: Adequate for Discharge 09/29/2020 1231 by Lennie Hummer, RN Outcome: Adequate for Discharge   Problem: Skin Integrity: Goal: Risk for impaired skin integrity will decrease 09/29/2020 1819 by Lennie Hummer, RN Outcome: Adequate for Discharge 09/29/2020 1231 by Lennie Hummer, RN Outcome: Adequate for Discharge

## 2020-09-29 NOTE — Progress Notes (Signed)
Since [facility] has not yet arrived, [name] daughter does not want her father to leave late at night, in the cold. MD made aware.

## 2020-09-29 NOTE — Progress Notes (Signed)
[name] family received the DME. Pt is ready to be D/C. Ambulance transportation arranged by Glenda Chroman, [place].

## 2020-09-29 NOTE — Discharge Summary (Signed)
Physician Discharge Summary  Matthew Gray [id number] DOB: 1934/01/06 DOA: 09/19/2020  PCP: Biagio Borg, MD  Admit date: 09/19/2020 Discharge date: 09/29/2020  Recommendations for Outpatient Follow-up:  1.    Follow-up Information    Biagio Borg, MD Follow up.   Specialties: Internal Medicine, Radiology Why: as needed Contact information: [place] [address] [phone]                Discharge Diagnoses: Principal diagnosis is #1 Principal Problem:   Sepsis ([place]) Active Problems:   Essential hypertension   COPD (chronic obstructive pulmonary disease) ([place])   Acute renal failure superimposed on chronic kidney disease ([place])   Dysphagia   Acute hypoxemic respiratory failure ([facility])   Aspiration pneumonia ([facility])   Protein-calorie malnutrition, severe   Microcytic anemia   FTT (failure to thrive) in adult   Aortic aneurysm ([place])   Aortic atherosclerosis ([place])   Discharge Condition: improved Disposition: home with hospice  Diet recommendation:  Diet Orders (From admission, onward)    Start     Ordered   09/26/20 1534  DIET - DYS 1 Room service appropriate? Yes; Fluid consistency: Thin  Diet effective now       Question Answer Comment  Room service appropriate? Yes   Fluid consistency: Thin      09/26/20 1533           Filed Weights   09/19/20 1717 09/20/20 2147  Weight: 49 kg 49.8 kg    HPI/Hospital Course:   87yom PMH dementia, bladder cancer, COPD, recurrent pneumonia, presented with generalized weakness, cough, hypoxia, failure to thrive.  Admitted for recurrent aspiration pneumonia with acute hypoxia.  Improved fairly rapidly with clinical resolution of pneumonia.  However noted to have significant dysphagia and given likelihood of recurrent aspiration given clinical decline with dementia as well as bladder cancer (presumed metastatic), discussed in detail with daughter, elected DNR status, hospice.  Has had a couple episodes of  fever last 48 hours, discussed with daughter, no antibiotics planned.  Continue comfort measures at home with hospice.  He has not required any comfort medications here.  A & P  Sepsis secondary to aspiration pneumonia secondary to dysphagia, with associated acute hypoxic respiratory failure.   --Sepsis resolved.  Pneumonia resolved.  Completed antibiotics.  Pleural effusions asymptomatic, no further treatment.  Suspect fever from atelectasis.  Elevated WBC probably related to aspiration.  As previously discussed with daughter, no further antibiotics.  Dysphagia, solid --Continue diet as per speech therapy  COPD --Remains stable stable.  CKD stage II --Acute kidney injury ruled out  Microcytic anemia --Stable.  No further follow-up.  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 Louis Meckel who recommended hospice, Dr. Tammi Klippel who reported radiation was planned to the bladder to prevent bleeding, however hospice is quite reasonable.  Also discussed with oncology informally, recommended hospice  Adult failure to thrive in the setting of advanced dementia dementia, goes to daycare --Stable.    Infrarenal aortic and left common iliac artery aneurysms measuring 3.1 and 3 cm respectively.  --No follow-up indicated  Aortic atherosclerosis --No treatment indicated   Today's assessment: S: CC: aspiration  Patient w/o complaint. Confused.  Fever overnight noted.  O: Vitals:  Vitals:   09/29/20 0559 09/29/20 0747  BP: 108/73 124/83  Pulse: 97 100  Resp:  17  Temp: (!) 100.5 F (38.1 C) 99.6 F (37.6  C)  SpO2: 96% 97%    Constitutional:  . Appears calm and comfortable ENMT:  . grossly normal hearing  Respiratory:  . CTA bilaterally, no w/r/r.  . Respiratory effort normal. Cardiovascular:  . RRR, no m/r/g . No LE extremity edema   Psychiatric:   . Mental status o Mood, affect appropriate  Discharge Instructions  Discharge Instructions    Discharge instructions   Complete by: As directed    Call your physician or seek immediate medical attention for uncontrolled pain or worsening of condition. Recommend pureed diet, thin liquids, small bites, sips, keep upright   Discharge instructions   Complete by: As directed    Call your hospice or seek immediate medical attention for uncontrolled pain, anxiety or worsening of condition. Keep on pureed diet and thin liquids, small bites. Keep upright when feeding and at least 30 minutes after feeding.   Increase activity slowly   Complete by: As directed    Increase activity slowly   Complete by: As directed      Allergies as of 09/29/2020      Reactions   Atorvastatin Other (See Comments)   REACTION: myalgias   Simvastatin Other (See Comments)   REACTION: myalgias   Zetia [ezetimibe] Other (See Comments)   weakness   Ace Inhibitors Other (See Comments)   cough   Penicillins Other (See Comments)   Unknown; Tolerated Ceftriaxone on 10/2018      Medication List    STOP taking these medications   [employer] Tabs   traMADol 50 MG tablet Commonly known as: Ultram     TAKE these medications   acetaminophen 325 MG tablet Commonly known as: TYLENOL Take 325 mg by mouth every 6 (six) hours as needed for moderate pain. What changed: Another medication with the same name was removed. Continue taking this medication, and follow the directions you see here.   amLODipine 5 MG tablet Commonly known as: NORVASC TAKE 1 TABLET EVERY DAY   aspirin EC 81 MG tablet Take 1 tablet (81 mg total) by mouth daily.   donepezil 10 MG tablet Commonly known as: ARICEPT TAKE 1 TABLET EVERY DAY  AT  2:00 AM What changed:   how much to take  how to take this  when to take this  additional instructions   Flutter Devi 1 Device by Does not apply route as needed.    ipratropium-albuterol 0.5-2.5 (3) MG/3ML Soln Commonly known as: DUONEB Take 3 mLs by nebulization every 6 (six) hours as needed (for cough or shortness of breath).   QUEtiapine 25 MG tablet Commonly known as: SEROQUEL Take 1 tablet (25 mg total) by mouth at bedtime. What changed:   how much to take  how to take this  when to take this  additional instructions            Durable Medical Equipment  (From admission, onward)         Start     Ordered   09/27/20 1313  For home use only DME standard manual wheelchair with seat cushion  Once       Comments: Patient suffers from advanced dementia which impairs their ability to perform daily activities like bathing, dressing, grooming, and toileting in the home.  A cane, crutch, or walker will not resolve issue with performing activities of daily living. A wheelchair will allow patient to safely perform daily activities. Patient can safely propel the wheelchair in the home or has a caregiver who can provide  assistance. Length of need Lifetime. Accessories: elevating leg rests (ELRs), wheel locks, extensions and anti-tippers.   09/27/20 1312   09/27/20 1312  For home use only DME Hospital bed  Once       Question Answer Comment  Length of Need Lifetime   Patient has (list medical condition): advanced dementia, recurrent aspiration   The above medical condition requires: Patient requires the ability to reposition frequently   Head must be elevated greater than: 45 degrees   Bed type Semi-electric      09/27/20 1312   09/27/20 1300  For home use only DME 3 n 1  Once        09/27/20 1259   09/27/20 1259  For home use only DME Walker rolling  Once       Question Answer Comment  Walker: With [place]   Patient needs a walker to treat with the following condition Unsteady gait      09/27/20 1258         Allergies  Allergen Reactions  . Atorvastatin Other (See Comments)    REACTION: myalgias  . Simvastatin Other (See  Comments)    REACTION: myalgias  . Zetia [Ezetimibe] Other (See Comments)    weakness  . Ace Inhibitors Other (See Comments)    cough  . Penicillins Other (See Comments)    Unknown; Tolerated Ceftriaxone on 10/2018    The results of significant diagnostics from this hospitalization (including imaging, microbiology, ancillary and laboratory) are listed below for reference.    Significant Diagnostic Studies: DG Chest 2 View  Result Date: 09/23/2020 CLINICAL DATA:  Weakness and fever. EXAM: CHEST - 2 VIEW COMPARISON:  Chest x-rays dated 09/19/2020 and 05/21/2020. FINDINGS: Borderline cardiomegaly, stable. Overall cardiomediastinal silhouette is stable in size and configuration. Improved aeration at the RIGHT lung base. Persistent opacity at the LEFT lung base. Probable small bilateral pleural effusions. No pneumothorax is seen. Osseous structures about the chest are unremarkable. IMPRESSION: 1. Improved aeration at the RIGHT lung base, suggesting resolving atelectasis or pneumonia. 2. Persistent LEFT lower lobe opacity, atelectasis versus pneumonia. 3. Probable small bilateral pleural effusions. 4. Borderline cardiomegaly, stable. Electronically Signed   By: Franki Cabot M.D.   On: 09/23/2020 12:47   CT CHEST W CONTRAST  Result Date: 09/25/2020 CLINICAL DATA:  Abdominal pain, acute, nonlocalized; Pneumonia, unresolved Weakness, hypoxia, difficulty eating. Patient with history of COPD, chronic kidney disease, and bladder tumor status post recent TURBT. EXAM: CT CHEST, ABDOMEN, AND PELVIS WITH CONTRAST TECHNIQUE: Multidetector CT imaging of the chest, abdomen and pelvis was performed following the standard protocol during bolus administration of intravenous contrast. CONTRAST:  166mL OMNIPAQUE IOHEXOL 300 MG/ML  SOLN COMPARISON:  Chest radiograph 2 days ago. Noncontrast abdominal CT 04/28/2020 FINDINGS: CT CHEST FINDINGS Cardiovascular: Aortic atherosclerosis. No dissection or acute aortic findings. No  central pulmonary embolus to the lobar level on this non dedicated chest CTA. Upper normal heart size. There are scattered coronary artery calcifications. No pericardial effusion. Mediastinum/Nodes: Scattered stomal mediastinal lymph nodes not enlarged by size criteria. No hilar adenopathy. Decompressed esophagus without wall thickening. No visualized thyroid nodule. Lungs/Pleura: Moderate bilateral pleural effusions, left greater than right. Associated compressive atelectasis. Small amount of fluid in the left inter lobar fissure with adjacent atelectasis in the upper lobe and lingula. Retained mucus/debris throughout the trachea extending into the right mainstem bronchus. There is bilateral lower lobe bronchial thickening. Punctate parenchymal calcifications in the lingula and both lower lobes. Patchy ground-glass opacity in the  non dependent right lower lobe distal to bronchial thickening. Tree in bud opacities in the dependent right upper lobe, as well as the right lung apex. Few clustered tiny nodules at the left lung apex, for example series 8 images 41 and 44. No dominant pulmonary mass. Musculoskeletal: Sclerosis involving the lateral left eleventh rib likely represents remote fracture. Punctate sclerotic density in the left anterior sixth and seventh ribs, nonspecific. No bony destruction or acute osseous abnormality in the thorax. There is generalized edema of subcutaneous chest wall soft tissues. CT ABDOMEN PELVIS FINDINGS Hepatobiliary: No focal hepatic lesion. Unremarkable gallbladder. There is no biliary dilatation. Pancreas: Parenchymal atrophy. Portions of the pancreas are obscured by dense contrast in the colon from prior barium swallow. Occasional scattered pancreatic head calcifications. No evidence of pancreatic mass. No ductal dilatation. Spleen: Normal in size without focal abnormality. Adrenals/Urinary Tract: No adrenal nodule. There is moderate right hydroureteronephrosis. Ureter is likely  dilated to the bladder insertion, however not well-defined in the distal portion due to streak artifact from high-density contrast in the colon. Mild left hydronephrosis. Left ureter is difficult to delineate. Multiple bilateral renal cysts, including a dominant cyst arising from the lower right kidney measuring up to 10 cm cranial caudal. No evidence of solid renal lesion. Calcifications at the renal hila are favored to represent vascular calcifications rather than stones. The bladder is diffusely irregular, with 3 fluid density structures and irregular soft tissue in the pelvis. The urinary bladder is presumably the more posterior aspect of these fluid density structures in demonstrates diffuse irregular wall thickening. Adjacent thick-walled fluid density structure measuring 6.1 x 5 cm in the left pelvis is no definite communication to the presumed urinary bladder, series 3, image 98. This potential could represent a bladder diverticulum with irregular borders though no definite bladder communication is seen. Alternatively, a necrotic lymph node is considered. More inferiorly there is a 2.9 x 4.0 cm fluid density structure with irregular borders, series 3, image 107. This is suspicious for a necrotic adenopathy. The possibility of postoperative fluid collection is also considered. There is no definite bladder communication. Stomach/Bowel: Please note that high-density material throughout the colon from prior barium swallow obscures bowel in regional evaluation. No evidence of obstruction. The stomach is decompressed and not well assessed. There is no obvious bowel wall thickening or evidence of bowel inflammation. Overall bowel assessment is limited. Moderate colonic stool burden mixed with barium. Diverticula in prior exam are obscured by streak artifact.Possible rectocele. Vascular/Lymphatic: Aortic and branch atherosclerosis. Infrarenal aortic aneurysm measures 3.1 cm, series 3, image 78. Left common iliac  artery aneurysm measures 3 cm, series 3, image 86. Patent portal vein. The 2 perivesicular thick-walled collections as described above may represent necrotic pelvic adenopathy. There is also a presumed necrotic node in the right low pelvis measuring 15 mm, series 3, image 107. Paucity of body fat limits detailed assessment for adenopathy. No obvious enlarged retroperitoneal or upper abdominal lymph nodes. Reproductive: Prostate gland present with central calcifications. Other: Diffuse edema of the subcutaneous and intra-abdominal fat suggestive of third-spacing. Small amount of upper abdominal ascites. Musculoskeletal: Lytic lesion of the left superior ramus with cortical destruction and extraosseous extension consistent with metastatic disease, series 3, image 103. Chronic compression fracture of L2 and L3, unchanged from prior. Heterogeneous appearance of the sacrum which is nonspecific. IMPRESSION: 1. Moderate bilateral pleural effusions, left greater than right, with adjacent compressive atelectasis. 2. Retained mucus/debris throughout the trachea extending into the right mainstem bronchus. Lower lobe bronchial  thickening. Patchy areas of tree in bud opacities. Overall findings suggestive of aspiration. Recommend correlation with recent barium swallow. Other infectious or inflammatory etiologies are considered. 3. Additional ground-glass opacity in the right lower lobe may represent pneumonia. 4. Mild left and moderate right hydronephrosis. 5. Abnormal appearance of the urinary bladder in this patient with known bladder malignancy. Bladder is diffusely thick walled and irregular. Two adjacent fluid density structures in the left pelvis are favored to represent adjacent necrotic adenopathy, the possibility of bladder diverticulum are raised. This is felt less likely as no definite communication to the urinary bladder is seen. Inferiorly there is a more typical necrotic node suspicious for metastatic disease. 6.  Lytic lesion of the left superior ramus with cortical destruction and extraosseous extension, consistent with metastatic disease. 7. Punctate sclerotic foci in the left anterior sixth and seventh ribs as well as heterogeneous appearance of the sacrum, nonspecific. Consider bone scan for osseous metastatic evaluation. L2 and L3 vertebral bodies are unchanged from prior exam. 8. Diffuse subcutaneous edema of the chest and abdominal wall and intra-abdominal fat. Small volume ascites. Findings suggestive of third spacing. 9. Infrarenal aortic and left common iliac artery aneurysms measuring 3.1 and 3 cm respectively. Recommend follow-up every 3 years. This recommendation follows ACR consensus guidelines: White Paper of the ACR Incidental Findings Committee II on Vascular Findings. J Am Coll Radiol 2013; 10:789-794. Aortic Atherosclerosis (ICD10-I70.0). Electronically Signed   By: Keith Rake M.D.   On: 09/25/2020 15:30   CT ABDOMEN PELVIS W CONTRAST  Result Date: 09/25/2020 CLINICAL DATA:  Abdominal pain, acute, nonlocalized; Pneumonia, unresolved Weakness, hypoxia, difficulty eating. Patient with history of COPD, chronic kidney disease, and bladder tumor status post recent TURBT. EXAM: CT CHEST, ABDOMEN, AND PELVIS WITH CONTRAST TECHNIQUE: Multidetector CT imaging of the chest, abdomen and pelvis was performed following the standard protocol during bolus administration of intravenous contrast. CONTRAST:  152mL OMNIPAQUE IOHEXOL 300 MG/ML  SOLN COMPARISON:  Chest radiograph 2 days ago. Noncontrast abdominal CT 04/28/2020 FINDINGS: CT CHEST FINDINGS Cardiovascular: Aortic atherosclerosis. No dissection or acute aortic findings. No central pulmonary embolus to the lobar level on this non dedicated chest CTA. Upper normal heart size. There are scattered coronary artery calcifications. No pericardial effusion. Mediastinum/Nodes: Scattered stomal mediastinal lymph nodes not enlarged by size criteria. No hilar  adenopathy. Decompressed esophagus without wall thickening. No visualized thyroid nodule. Lungs/Pleura: Moderate bilateral pleural effusions, left greater than right. Associated compressive atelectasis. Small amount of fluid in the left inter lobar fissure with adjacent atelectasis in the upper lobe and lingula. Retained mucus/debris throughout the trachea extending into the right mainstem bronchus. There is bilateral lower lobe bronchial thickening. Punctate parenchymal calcifications in the lingula and both lower lobes. Patchy ground-glass opacity in the non dependent right lower lobe distal to bronchial thickening. Tree in bud opacities in the dependent right upper lobe, as well as the right lung apex. Few clustered tiny nodules at the left lung apex, for example series 8 images 41 and 44. No dominant pulmonary mass. Musculoskeletal: Sclerosis involving the lateral left eleventh rib likely represents remote fracture. Punctate sclerotic density in the left anterior sixth and seventh ribs, nonspecific. No bony destruction or acute osseous abnormality in the thorax. There is generalized edema of subcutaneous chest wall soft tissues. CT ABDOMEN PELVIS FINDINGS Hepatobiliary: No focal hepatic lesion. Unremarkable gallbladder. There is no biliary dilatation. Pancreas: Parenchymal atrophy. Portions of the pancreas are obscured by dense contrast in the colon from prior barium swallow. Occasional scattered  pancreatic head calcifications. No evidence of pancreatic mass. No ductal dilatation. Spleen: Normal in size without focal abnormality. Adrenals/Urinary Tract: No adrenal nodule. There is moderate right hydroureteronephrosis. Ureter is likely dilated to the bladder insertion, however not well-defined in the distal portion due to streak artifact from high-density contrast in the colon. Mild left hydronephrosis. Left ureter is difficult to delineate. Multiple bilateral renal cysts, including a dominant cyst arising from  the lower right kidney measuring up to 10 cm cranial caudal. No evidence of solid renal lesion. Calcifications at the renal hila are favored to represent vascular calcifications rather than stones. The bladder is diffusely irregular, with 3 fluid density structures and irregular soft tissue in the pelvis. The urinary bladder is presumably the more posterior aspect of these fluid density structures in demonstrates diffuse irregular wall thickening. Adjacent thick-walled fluid density structure measuring 6.1 x 5 cm in the left pelvis is no definite communication to the presumed urinary bladder, series 3, image 98. This potential could represent a bladder diverticulum with irregular borders though no definite bladder communication is seen. Alternatively, a necrotic lymph node is considered. More inferiorly there is a 2.9 x 4.0 cm fluid density structure with irregular borders, series 3, image 107. This is suspicious for a necrotic adenopathy. The possibility of postoperative fluid collection is also considered. There is no definite bladder communication. Stomach/Bowel: Please note that high-density material throughout the colon from prior barium swallow obscures bowel in regional evaluation. No evidence of obstruction. The stomach is decompressed and not well assessed. There is no obvious bowel wall thickening or evidence of bowel inflammation. Overall bowel assessment is limited. Moderate colonic stool burden mixed with barium. Diverticula in prior exam are obscured by streak artifact.Possible rectocele. Vascular/Lymphatic: Aortic and branch atherosclerosis. Infrarenal aortic aneurysm measures 3.1 cm, series 3, image 78. Left common iliac artery aneurysm measures 3 cm, series 3, image 86. Patent portal vein. The 2 perivesicular thick-walled collections as described above may represent necrotic pelvic adenopathy. There is also a presumed necrotic node in the right low pelvis measuring 15 mm, series 3, image 107.  Paucity of body fat limits detailed assessment for adenopathy. No obvious enlarged retroperitoneal or upper abdominal lymph nodes. Reproductive: Prostate gland present with central calcifications. Other: Diffuse edema of the subcutaneous and intra-abdominal fat suggestive of third-spacing. Small amount of upper abdominal ascites. Musculoskeletal: Lytic lesion of the left superior ramus with cortical destruction and extraosseous extension consistent with metastatic disease, series 3, image 103. Chronic compression fracture of L2 and L3, unchanged from prior. Heterogeneous appearance of the sacrum which is nonspecific. IMPRESSION: 1. Moderate bilateral pleural effusions, left greater than right, with adjacent compressive atelectasis. 2. Retained mucus/debris throughout the trachea extending into the right mainstem bronchus. Lower lobe bronchial thickening. Patchy areas of tree in bud opacities. Overall findings suggestive of aspiration. Recommend correlation with recent barium swallow. Other infectious or inflammatory etiologies are considered. 3. Additional ground-glass opacity in the right lower lobe may represent pneumonia. 4. Mild left and moderate right hydronephrosis. 5. Abnormal appearance of the urinary bladder in this patient with known bladder malignancy. Bladder is diffusely thick walled and irregular. Two adjacent fluid density structures in the left pelvis are favored to represent adjacent necrotic adenopathy, the possibility of bladder diverticulum are raised. This is felt less likely as no definite communication to the urinary bladder is seen. Inferiorly there is a more typical necrotic node suspicious for metastatic disease. 6. Lytic lesion of the left superior ramus with cortical destruction and  extraosseous extension, consistent with metastatic disease. 7. Punctate sclerotic foci in the left anterior sixth and seventh ribs as well as heterogeneous appearance of the sacrum, nonspecific. Consider bone  scan for osseous metastatic evaluation. L2 and L3 vertebral bodies are unchanged from prior exam. 8. Diffuse subcutaneous edema of the chest and abdominal wall and intra-abdominal fat. Small volume ascites. Findings suggestive of third spacing. 9. Infrarenal aortic and left common iliac artery aneurysms measuring 3.1 and 3 cm respectively. Recommend follow-up every 3 years. This recommendation follows ACR consensus guidelines: White Paper of the ACR Incidental Findings Committee II on Vascular Findings. J Am Coll Radiol 2013; 10:789-794. Aortic Atherosclerosis (ICD10-I70.0). Electronically Signed   By: Keith Rake M.D.   On: 09/25/2020 15:30   DG Chest Port 1 View  Result Date: 09/19/2020 CLINICAL DATA:  [name] 2 days ago, PCP, COPD EXAM: PORTABLE CHEST 1 VIEW COMPARISON:  05/21/2020 FINDINGS: Single frontal view of the chest demonstrates an unremarkable cardiac silhouette. There is background emphysema, with bibasilar airspace disease greatest on the right. Small right pleural effusion is suspected. No pneumothorax. No acute bony abnormalities. IMPRESSION: 1. Bibasilar consolidation, right greater than left, favor airspace disease over atelectasis. 2. Small right pleural effusion. 3. Emphysema. Electronically Signed   By: Randa Ngo M.D.   On: 09/19/2020 17:24   DG Swallowing Func-Speech Pathology  Result Date: 09/21/2020 Objective Swallowing Evaluation: Type of Study: MBS-Modified Barium Swallow Study  Patient Details Name: Matthew Gray MRN: [id number] Date of Birth: [birth year] Today's Date: 09/21/2020 Time: SLP Start Time (ACUTE ONLY): [id number] -SLP Stop Time (ACUTE ONLY): [id number] SLP Time Calculation (min) (ACUTE ONLY): 20 min Past Medical History: Past Medical History: Diagnosis Date . Advanced dementia ([place]) 2014  followed by pcp---  previously had seen neurologist, dr Krista Blue . Allergic rhinitis  . Bladder cancer [facility])   urologist--- dr Louis Meckel . BPH (benign prostatic hyperplasia)  . Chronic cough   per pt  daughter, pt has always had cough long before had covid  . CKD (chronic kidney disease), stage III ([place])  . Congestion of respiratory tract   post covid ,  per pt daughter mostly in morning time after doing nebulizer and productive . 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 Coronary artery disease   cardiac cath 08-11-2001 in epic,  very minimal nonobstructive cad and luminal irregularities, normal lvsf . DDD (degenerative disc disease), lumbar  . Depression  . 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 Hematuria  . History of 2019 novel coronavirus disease (COVID-19) 05-21-2020 dx  hospitalized and discharged 05-27-2020, covid pneumonia, only on oxygen  . History of adenomatous polyp of colon  . History of recurrent pneumonia  . History of sepsis 10/2018  due to pneumonia . History of vertebral compression fracture   L3 . Hyperlipidemia  . Hypertension   followed by pcp . Left leg swelling   08-08-2020 per pt daughter , not red or does not feel warm . Peripheral neuropathy  . Peripheral vascular disease ([place])  . Urinary incontinence, mixed  . Wears dentures   upper Past Surgical History: Past Surgical History: Procedure Laterality Date . CARDIAC CATHETERIZATION  08-11-2001  @[facility]   very minmial nonobstructive cad, normal lvsf . CYSTOSCOPY W/ RETROGRADES N/A 08/09/2020  Procedure: CYSTOSCOPY WITH BILATERAL RETROGRADE PYELOGRAM;  Surgeon: Ardis Hughs, MD;  Location: [facility];  Service: Urology;  Laterality: N/A; . NO  PAST SURGERIES   . TRANSURETHRAL RESECTION OF BLADDER TUMOR N/A 08/09/2020  Procedure: TRANSURETHRAL RESECTION OF BLADDER TUMOR (TURBT);  Surgeon: Ardis Hughs, MD;  Location: [facility];  Service: Urology;  Laterality: N/A; HPI: pt is an 87 yo male admitted to [facility] after fall - diagnosed with FTT and pna *right more than left.  Pt has h/o  dementia, recurrent pnas, chronic bronchitis.  Pt has undergone MBS in thte past with findings of mild dysphagia - coughing throughout testing without infiltration of barium into larynx/trachea.  Pt repeated prior CXRs 03/05/2020, 03/2020, 04/2020 and 08/2020 showing concern for recurrent pnas.  Pt today admits to dysphagia prior to admission - coughing with liquids more than with foods.  Pt is a full code at this time.  Subjective: Pt seen in radiology for MBS Assessment / Plan / Recommendation CHL IP CLINICAL IMPRESSIONS 09/21/2020 Clinical Impression Pt presents with moderate oropharyngeal dysphagia, characterized as follows: Orally, pt exhibits poor bolus formation with lingual pumping, piecemeal deglutition, and premature spillage over the tongue base. Pt did not have upper dentures in place during this evaluation, and was noted to require a significant amount of time to chew graham cracker pieces. Oral residue was noted, requiring puree to facilitate clearing of cracker residual. Slight lingual residue was seen across consistencies. Pt had difficulty generating a dry swallow to clear oral residue, which was noted to spill to the vallecula after the swallow. Pharyngeal swallow is characterized by trigger of the swallow reflex at the level of the vallecula on puree and solid textures, and at the pyriform sinus on thin and nectar thick liquids. Despite this significant delay, no penetration or aspiration was seen on any consistency, even when taxed with large and /or consecutive boluses. There was no post-swallow residue of any consistency. Recommend continuing with puree textures and thin liquids, with meds crushed in puree. Pt will benefit from assistance with feeding, and should have his oral cavity checked after meals to insure clearing. Pt is at risk for aspiration due to cognitive impairment, oral issues, and swallow reflex delay, despite lack of penetration or aspiration on this study. He is also at risk for  inadequate PO intake. Supplements may be helpful. Safe swallow precautions were sent with pt to be posted at [facility]. SLP will continue to follow acutely to assess diet tolerance and continue education.  SLP Visit Diagnosis Dysphagia, oropharyngeal phase (R13.12)     Impact on safety and function Moderate aspiration risk;Risk for inadequate nutrition/hydration   CHL IP TREATMENT RECOMMENDATION 09/21/2020 Treatment Recommendations Therapy as outlined in treatment plan below   Prognosis 09/21/2020 Prognosis for Safe Diet Advancement Guarded Barriers to Reach Goals Cognitive deficits   CHL IP DIET RECOMMENDATION 09/21/2020 SLP Diet Recommendations Thin liquid;Dysphagia 1 (Puree) solids Liquid Administration via Straw Medication Administration Crushed with puree Compensations Slow rate;Small sips/bites;Minimize environmental distractions Postural Changes Remain semi-upright after after feeds/meals (Comment);Seated upright at 90 degrees   CHL IP OTHER RECOMMENDATIONS 09/21/2020   Oral Care Recommendations Oral care QID     CHL IP FOLLOW UP RECOMMENDATIONS 09/21/2020 Follow up Recommendations 24 hour supervision/assistance   CHL IP FREQUENCY AND DURATION 09/21/2020 Speech Therapy Frequency (ACUTE ONLY) min 2x/week Treatment Duration 1 week;2 weeks      CHL IP ORAL PHASE 09/21/2020 Oral Phase Impaired   Oral - Nectar Teaspoon Weak lingual manipulation;Premature spillage;Delayed oral transit   Oral - Nectar Straw Lingual pumping;Delayed oral transit;Premature spillage;Piecemeal swallowing;Lingual/palatal residue   Oral - Thin Straw Lingual pumping;Premature  spillage;Delayed oral transit;Piecemeal swallowing Oral - Puree Lingual pumping;Premature spillage;Delayed oral transit;Piecemeal swallowing;Lingual/palatal residue Oral - Mech Soft Premature spillage;Decreased bolus cohesion;Delayed oral transit;Piecemeal swallowing;Lingual/palatal residue    CHL IP PHARYNGEAL PHASE 09/21/2020 Pharyngeal Phase Impaired   Pharyngeal- Nectar Teaspoon  Delayed swallow initiation-vallecula Pharyngeal- Nectar Straw Delayed swallow initiation-pyriform sinuses Pharyngeal- Thin Straw Delayed swallow initiation-pyriform sinuses Pharyngeal- Puree Delayed swallow initiation-vallecula Pharyngeal- Mechanical Soft Delayed swallow initiation-vallecula    CHL IP CERVICAL ESOPHAGEAL PHASE 09/21/2020 Cervical Esophageal Phase [facility], [facility], [place] Speech Language Pathologist Office: [phone] Pager: [phone] Shonna Chock 09/21/2020, 1:41 PM               Microbiology: Recent Results (from the past 240 hour(s))  Culture, blood (routine x 2)     Status: None   Collection Time: 09/19/20  5:24 PM   Specimen: BLOOD  Result Value Ref Range Status   Specimen Description   Final    BLOOD RIGHT ANTECUBITAL Performed at [facility] [address] [place], [place] [address]    Special Requests   Final    BOTTLES DRAWN AEROBIC AND ANAEROBIC Blood Culture results may not be optimal due to an inadequate volume of blood received in culture bottles Performed at [facility] [address] [place], [place] [address]    Culture   Final    NO GROWTH 5 DAYS Performed at [facility], [place] [address] [place], [place] [address]    Report Status 09/24/2020 FINAL  Final  SARS Coronavirus 2 by RT PCR (hospital order, performed in [facility] hospital lab) Nasopharyngeal Nasopharyngeal Swab     Status: None   Collection Time: 09/19/20  5:30 PM   Specimen: Nasopharyngeal Swab  Result Value Ref Range Status   SARS Coronavirus 2 NEGATIVE NEGATIVE Final    Comment: (NOTE) SARS-CoV-2 target nucleic acids are NOT DETECTED.  The SARS-CoV-2 RNA is generally detectable in upper and lower respiratory specimens during the acute phase of infection. The lowest concentration of SARS-CoV-2 viral copies this assay can detect is 250 copies / mL. A negative result does not preclude SARS-CoV-2 infection and should not  be used as the sole basis for treatment or other patient management decisions.  A negative result may occur with improper specimen collection / handling, submission of specimen other than nasopharyngeal swab, presence of viral mutation(s) within the areas targeted by this assay, and inadequate number of viral copies (<250 copies / mL). A negative result must be combined with clinical observations, patient history, and epidemiological information.  Fact Sheet for Patients:   StrictlyIdeas.no  Fact Sheet for Healthcare Providers: BankingDealers.co.za  This test is not yet approved or  cleared by the [place] FDA and has been authorized for detection and/or diagnosis of SARS-CoV-2 by FDA under an Emergency Use Authorization (EUA).  This EUA will remain in effect (meaning this test can be used) for the duration of the COVID-19 declaration under Section 564(b)(1) of the Act, 21 U.S.C. section 360bbb-3(b)(1), unless the authorization is terminated or revoked sooner.  Performed at [facility], [place] [address] [place], [place] [address]   Culture, blood (routine x 2)     Status: None   Collection Time: 09/19/20  6:00 PM   Specimen: BLOOD RIGHT FOREARM  Result Value Ref Range Status   Specimen Description   Final    BLOOD RIGHT FOREARM Performed at [facility] [address] [place], [place] [address]  Special Requests   Final    BOTTLES DRAWN AEROBIC ONLY Blood Culture adequate volume Performed at [facility] [address], [place], [place] [address]    Culture   Final    NO GROWTH 5 DAYS Performed at [facility], [place] [address] [place], [place] [address]    Report Status 09/24/2020 FINAL  Final  Culture, blood (Routine X 2) w Reflex to ID Panel     Status: None   Collection Time: 09/24/20  8:51 AM   Specimen: BLOOD  Result Value Ref Range Status    Specimen Description   Final    BLOOD RIGHT ANTECUBITAL Performed at [facility] [address], [place], [place] [address]    Special Requests   Final    BOTTLES DRAWN AEROBIC AND ANAEROBIC Blood Culture adequate volume Performed at [facility] [address], [place], [place] [address]    Culture   Final    NO GROWTH 5 DAYS Performed at [facility], [place] [address], [place], [place] [address]    Report Status 09/29/2020 FINAL  Final  Culture, blood (Routine X 2) w Reflex to ID Panel     Status: None   Collection Time: 09/24/20  8:52 AM   Specimen: BLOOD RIGHT FOREARM  Result Value Ref Range Status   Specimen Description   Final    BLOOD RIGHT FOREARM Performed at [facility], [place] [address], [place], [place] [address]    Special Requests   Final    BOTTLES DRAWN AEROBIC AND ANAEROBIC Blood Culture adequate volume Performed at [facility] [address] [place],  [address]    Culture   Final    NO GROWTH 5 DAYS Performed at [facility], [place] [address] [place],  [address]    Report Status 09/29/2020 FINAL  Final     Labs: Basic Metabolic Panel: Recent Labs  Lab 09/23/20 0654 09/24/20 0516 09/25/20 0539 09/26/20 0603  NA 143 143 141 142  K 3.4* 3.2* 3.3* 3.6  CL 107 107 108 107  CO2 26 26 23 23   GLUCOSE 96 93 92 86  BUN 21 19 22 21   CREATININE 0.89 0.90 0.79 0.83  CALCIUM 8.4* 8.2* 8.1* 8.3*   Liver Function Tests: Recent Labs  Lab 09/23/20 0654 09/24/20 0516 09/25/20 0539 09/26/20 0603  AST 16 17 19 19   ALT 12 12 12 13   ALKPHOS 96 100 91 97  BILITOT 0.5 0.8 0.8 0.5  PROT 5.9* 5.7* 5.3* 5.4*  ALBUMIN 2.2* 2.1* 2.0* 2.0*   CBC: Recent Labs  Lab 09/23/20 0654 09/24/20 0516 09/25/20 0539 09/26/20 0603  WBC 37.4* 38.2* 37.9* 43.8*  NEUTROABS 34.5* 35.1* 35.1* 40.7*  HGB 8.1* 7.6* 7.2* 7.6*  HCT 26.9* 25.7* 24.4* 25.1*  MCV 72.7* 74.1* 73.7* 73.4*   PLT 411* 355 340 345    Recent Labs    09/19/20 1724  BNP 353.6*     Principal Problem:   Sepsis (HCC) Active Problems:   Essential hypertension   COPD (chronic obstructive pulmonary disease) (HCC)   Acute renal failure superimposed on chronic kidney disease (HCC)   Dysphagia   Acute hypoxemic respiratory failure (HCC)   Aspiration pneumonia (HCC)   Protein-calorie malnutrition, severe   Microcytic anemia   FTT (failure to thrive) in adult   Aortic aneurysm (HCC)   Aortic atherosclerosis (HCC)   Time coordinating discharge: 35 minutes  Signed:  Murray Hodgkins, MD  [facility]  09/29/2020, 2:09 PM

## 2020-10-01 ENCOUNTER — Ambulatory Visit: Payer: Medicare HMO | Admitting: Internal Medicine

## 2020-10-22 DIAGNOSIS — J449 Chronic obstructive pulmonary disease, unspecified: Secondary | ICD-10-CM | POA: Diagnosis not present

## 2020-11-23 DEATH — deceased
